# Patient Record
Sex: Female | Born: 1950 | Race: White | Hispanic: No | Marital: Married | Smoking: Former smoker
Health system: Southern US, Community
[De-identification: ages and names within clinical notes are randomized; demographics above are authoritative.]

## PROBLEM LIST (undated history)

## (undated) DIAGNOSIS — Z86010 Personal history of colonic polyps: Secondary | ICD-10-CM

## (undated) DIAGNOSIS — T7840XA Allergy, unspecified, initial encounter: Secondary | ICD-10-CM

## (undated) DIAGNOSIS — I2510 Atherosclerotic heart disease of native coronary artery without angina pectoris: Secondary | ICD-10-CM

## (undated) DIAGNOSIS — A0472 Enterocolitis due to Clostridium difficile, not specified as recurrent: Secondary | ICD-10-CM

## (undated) DIAGNOSIS — I10 Essential (primary) hypertension: Secondary | ICD-10-CM

## (undated) DIAGNOSIS — K623 Rectal prolapse: Secondary | ICD-10-CM

## (undated) DIAGNOSIS — E559 Vitamin D deficiency, unspecified: Secondary | ICD-10-CM

## (undated) DIAGNOSIS — C801 Malignant (primary) neoplasm, unspecified: Secondary | ICD-10-CM

## (undated) DIAGNOSIS — I499 Cardiac arrhythmia, unspecified: Secondary | ICD-10-CM

## (undated) DIAGNOSIS — R7303 Prediabetes: Secondary | ICD-10-CM

## (undated) DIAGNOSIS — I4891 Unspecified atrial fibrillation: Secondary | ICD-10-CM

## (undated) DIAGNOSIS — J189 Pneumonia, unspecified organism: Secondary | ICD-10-CM

## (undated) DIAGNOSIS — F32A Depression, unspecified: Secondary | ICD-10-CM

## (undated) DIAGNOSIS — M1990 Unspecified osteoarthritis, unspecified site: Secondary | ICD-10-CM

## (undated) DIAGNOSIS — K759 Inflammatory liver disease, unspecified: Secondary | ICD-10-CM

## (undated) DIAGNOSIS — E785 Hyperlipidemia, unspecified: Secondary | ICD-10-CM

## (undated) DIAGNOSIS — H269 Unspecified cataract: Secondary | ICD-10-CM

## (undated) DIAGNOSIS — Z87442 Personal history of urinary calculi: Secondary | ICD-10-CM

## (undated) DIAGNOSIS — D0590 Unspecified type of carcinoma in situ of unspecified breast: Secondary | ICD-10-CM

## (undated) DIAGNOSIS — F419 Anxiety disorder, unspecified: Secondary | ICD-10-CM

## (undated) HISTORY — DX: Allergy, unspecified, initial encounter: T78.40XA

## (undated) HISTORY — PX: CHOLECYSTECTOMY: SHX55

## (undated) HISTORY — PX: CARDIAC DEFIBRILLATOR PLACEMENT: SHX171

## (undated) HISTORY — PX: NASAL SEPTUM SURGERY: SHX37

## (undated) HISTORY — DX: Personal history of colonic polyps: Z86.010

## (undated) HISTORY — DX: Unspecified cataract: H26.9

## (undated) HISTORY — PX: CARDIOVERSION: SHX1299

## (undated) HISTORY — DX: Hyperlipidemia, unspecified: E78.5

## (undated) HISTORY — DX: Enterocolitis due to Clostridium difficile, not specified as recurrent: A04.72

## (undated) HISTORY — DX: Unspecified atrial fibrillation: I48.91

## (undated) HISTORY — PX: TONSILLECTOMY: SUR1361

## (undated) HISTORY — DX: Rectal prolapse: K62.3

## (undated) HISTORY — PX: COLONOSCOPY: SHX174

## (undated) HISTORY — DX: Unspecified osteoarthritis, unspecified site: M19.90

## (undated) HISTORY — DX: Anxiety disorder, unspecified: F41.9

## (undated) HISTORY — PX: APPENDECTOMY: SHX54

## (undated) HISTORY — DX: Vitamin D deficiency, unspecified: E55.9

## (undated) HISTORY — DX: Essential (primary) hypertension: I10

## (undated) HISTORY — DX: Prediabetes: R73.03

---

## 1980-01-14 HISTORY — PX: LAMINECTOMY: SHX219

## 1981-01-13 HISTORY — PX: BREAST SURGERY: SHX581

## 1983-01-14 HISTORY — PX: VAGINAL HYSTERECTOMY: SUR661

## 1987-01-14 HISTORY — PX: DIAGNOSTIC LAPAROSCOPY: SUR761

## 2001-03-18 ENCOUNTER — Other Ambulatory Visit: Admission: RE | Admit: 2001-03-18 | Discharge: 2001-03-18 | Payer: Self-pay | Admitting: Internal Medicine

## 2001-03-18 ENCOUNTER — Encounter: Payer: Self-pay | Admitting: Internal Medicine

## 2001-03-18 ENCOUNTER — Ambulatory Visit (HOSPITAL_COMMUNITY): Admission: RE | Admit: 2001-03-18 | Discharge: 2001-03-18 | Payer: Self-pay | Admitting: Internal Medicine

## 2001-07-15 ENCOUNTER — Encounter: Admission: RE | Admit: 2001-07-15 | Discharge: 2001-07-15 | Payer: Self-pay | Admitting: Internal Medicine

## 2001-07-15 ENCOUNTER — Encounter: Payer: Self-pay | Admitting: Internal Medicine

## 2002-08-24 ENCOUNTER — Encounter: Admission: RE | Admit: 2002-08-24 | Discharge: 2002-08-24 | Payer: Self-pay | Admitting: Internal Medicine

## 2002-08-24 ENCOUNTER — Encounter: Payer: Self-pay | Admitting: Internal Medicine

## 2002-11-14 ENCOUNTER — Inpatient Hospital Stay (HOSPITAL_COMMUNITY): Admission: EM | Admit: 2002-11-14 | Discharge: 2002-11-17 | Payer: Self-pay | Admitting: Emergency Medicine

## 2002-11-16 ENCOUNTER — Encounter (INDEPENDENT_AMBULATORY_CARE_PROVIDER_SITE_OTHER): Payer: Self-pay | Admitting: Cardiology

## 2002-11-16 ENCOUNTER — Encounter: Payer: Self-pay | Admitting: Cardiology

## 2003-01-14 LAB — HM COLONOSCOPY

## 2004-10-22 ENCOUNTER — Encounter: Admission: RE | Admit: 2004-10-22 | Discharge: 2004-10-22 | Payer: Self-pay | Admitting: Internal Medicine

## 2005-12-30 ENCOUNTER — Observation Stay (HOSPITAL_COMMUNITY): Admission: EM | Admit: 2005-12-30 | Discharge: 2005-12-31 | Payer: Self-pay | Admitting: Emergency Medicine

## 2005-12-30 ENCOUNTER — Encounter (INDEPENDENT_AMBULATORY_CARE_PROVIDER_SITE_OTHER): Payer: Self-pay | Admitting: Specialist

## 2006-03-19 ENCOUNTER — Ambulatory Visit (HOSPITAL_COMMUNITY): Admission: RE | Admit: 2006-03-19 | Discharge: 2006-03-19 | Payer: Self-pay | Admitting: Gynecology

## 2006-06-23 ENCOUNTER — Ambulatory Visit (HOSPITAL_COMMUNITY): Admission: RE | Admit: 2006-06-23 | Discharge: 2006-06-23 | Payer: Self-pay | Admitting: Gynecology

## 2006-07-29 ENCOUNTER — Ambulatory Visit (HOSPITAL_COMMUNITY): Admission: RE | Admit: 2006-07-29 | Discharge: 2006-07-29 | Payer: Self-pay | Admitting: Internal Medicine

## 2007-06-16 ENCOUNTER — Ambulatory Visit (HOSPITAL_COMMUNITY): Admission: RE | Admit: 2007-06-16 | Discharge: 2007-06-16 | Payer: Self-pay | Admitting: Internal Medicine

## 2007-11-01 ENCOUNTER — Encounter: Admission: RE | Admit: 2007-11-01 | Discharge: 2007-11-01 | Payer: Self-pay | Admitting: Internal Medicine

## 2009-05-23 ENCOUNTER — Ambulatory Visit (HOSPITAL_COMMUNITY): Admission: RE | Admit: 2009-05-23 | Discharge: 2009-05-23 | Payer: Self-pay | Admitting: Internal Medicine

## 2009-05-23 LAB — HM DEXA SCAN: HM Dexa Scan: NORMAL

## 2010-02-02 ENCOUNTER — Encounter: Payer: Self-pay | Admitting: Internal Medicine

## 2010-05-31 NOTE — Op Note (Signed)
   NAME:  Taylor Li, Taylor Li                         ACCOUNT NO.:  [id number]   MEDICAL RECORD NO.:  [id number]                   PATIENT TYPE:  INP   LOCATION:  4703                                 FACILITY:  [facility]   PHYSICIAN:  Cristy Hilts. Jacinto Halim, M.D.                  DATE OF BIRTH:  06-02-1950   DATE OF PROCEDURE:  11/16/2002  DATE OF DISCHARGE:                                 OPERATIVE REPORT   PROCEDURE:  Direct current cardioversion using 50 joules of biphasic  defibrillator synchronized electrical cardioversion using biphasic  defibrillator.   INDICATIONS FOR PROCEDURE:  The patient is a 60 year old female with history  of hypertension, who was admitted to the hospital with new onset of atrial  fibrillation.  She previously has had an episode in the past of atrial  fibrillation.  Because of new onset of atrial fibrillation, it was decided  to proceed with electrical cardioversion.   DESCRIPTION OF PROCEDURE:  Using the help of anesthesia with deep sedation,  synchronized direct current cardioversion was performed using biphasic  defibrillator.  50 joules of electrical current was delivered with  conversion of atrial fibrillation to normal sinus rhythm.  The patient  tolerated the procedure well and remained hemodynamically stable post  procedure.                                               Cristy Hilts. Jacinto Halim, M.D.    Pilar Plate  D:  11/16/2002  T:  11/17/2002  Job:  [id number]   cc:   Thereasa Solo. Little, M.D.  [address]  [place] [address]  Fax: [phone]

## 2010-05-31 NOTE — H&P (Signed)
NAME:  Taylor Li, Taylor Li               ACCOUNT NO.:  [id number]   MEDICAL RECORD NO.:  [id number]          PATIENT TYPE:  INP   LOCATION:  1828                         FACILITY:  [facility]   PHYSICIAN:  Sharlet Salina T. Hoxworth, M.D.DATE OF BIRTH:  1950-03-18   DATE OF ADMISSION:  12/30/2005  DATE OF DISCHARGE:                              HISTORY & PHYSICAL   CHIEF COMPLAINT:  Abdominal pain.   HISTORY OF PRESENT ILLNESS:  Taylor Li is a 60 year old white female  who 3 days ago developed the onset of severe pressure-like epigastric  and right upper quadrant pain, radiating to her back, associated with  nausea.  The pain has waxed and waned over the last 3 days but has never  gone away and today has been very severe.  She has consulted with Dr.  Oneta Rack and yesterday had a gallbladder ultrasound showing gallstones as  described below.  The pain worsened today, and she presents to the [facility] emergency room.  She describes a pressure-like pain, nausea without  vomiting.  No fever, chills or jaundice.  For several months she has had  milder episodes of similar pain often brought on after fatty foods.  Bowel movements have been okay.  No other GI or abdominal history of  significance.   PAST MEDICAL HISTORY:   SURGERY:  1. Appendectomy.  2. Hysterectomy.,  3. Lumbar laminectomy.,  4. Tonsillectomy.,  5. Deviated septum repair.  6. Pelvic laparoscopy.   MEDICAL:  1. Followed for a history of atrial fibrillation status post      cardioversion several years ago.  2. Hypertension.  3. Mild depression.  4. Osteoarthritis.   MEDICATIONS:  1. Aspirin 81 mg daily.  2. Quinoretic one daily.  3. Toprol XL 50 mg daily.  4. Wellbutrin 300 mg daily.  5. Naprosyn p.r.n.  6. Flexeril p.r.n.   ALLERGIES:  PHENERGAN which causes itching.   SOCIAL HISTORY:  She is married.  Works for [employer].  Quit  cigarettes in 2005.  No alcohol.   FAMILY HISTORY:  Noncontributory.   REVIEW  OF SYSTEMS:  GENERAL:  No fever, chills, weight change.  RESPIRATORY:  No shortness breath, cough, wheezing.  CARDIAC: No recent  palpitations, chest pain.  GI:  As above.  GU: No urinary burning or  frequency.  MUSCULOSKELETAL: Some chronic joint and back pain.   PHYSICAL EXAMINATION:  VITAL SIGNS: She is afebrile.  Vital Signs all  within normal limits.  GENERAL:  Alert, well-developed female who appears uncomfortable.  SKIN:  Warm, dry.  No rash or infection.  HEENT/NECK: No palpable mass or thyromegaly.  Sclerae nonicteric.  Nares, oropharynx clear.  LUNGS:  Clear without wheezing or increased work of breathing.  LYMPH NODES.  No cervical, subclavicular or inguinal nodes palpable.  ABDOMEN:  Nondistended.  Bowel sounds present.  There is well-localized  right upper quadrant tenderness with guarding.  No palpable masses or  organomegaly.  EXTREMITIES: No joint swelling or deformity.  NEUROLOGIC:  Alert, oriented.  Motor and sensory exams grossly normal.   LABORATORY:  CBC and CMET are within normal limits.  IMAGING:  Gallbladder ultrasound at [facility] December 17  shows multiple gallstones, normal common bile duct diameter.  No  gallbladder wall thickening or pericholecystic fluid.   ASSESSMENT/PLAN:  Persistent abdominal pain consistent with ongoing  biliary colic versus early acute cholecystitis.  The patient will be  admitted and treated  with pain medication, IV fluids.  Will plan urgent  laparoscopic cholecystectomy with cholangiogram.      Sharlet Salina T. Hoxworth, M.D.  Electronically Signed     BTH/MEDQ  D:  12/30/2005  T:  12/31/2005  Job:  [id number]

## 2010-05-31 NOTE — Discharge Summary (Signed)
NAME:  Taylor Li, Taylor Li                         ACCOUNT NO.:  [id number]   MEDICAL RECORD NO.:  [id number]                   Li TYPE:  INP   LOCATION:  4703                                 FACILITY:  [facility]   PHYSICIAN:  Thereasa Solo. Little, M.D.              DATE OF BIRTH:  03/06/1950   DATE OF ADMISSION:  11/15/2002  DATE OF DISCHARGE:  11/17/2002                                 DISCHARGE SUMMARY   DISCHARGE DIAGNOSES:  1. Paroxysmal atrial fibrillation resolved.     a. Transesophageal echocardiography with direct current cardioversion to        sinus rhythm.  2. Chest discomfort resolved.  3. Hypotension secondary to medications, improved after fluid bolus.  4. Tobacco use, resolving.  5. Hypertension controlled.  6. Anticoagulation.   DISCHARGE CONDITION:  Improved.   PROCEDURE:  TEE with DC cardioversion by Dr. Yates Decamp on November 16, 2002.   DISCHARGE MEDICATIONS:  1. Toprol XL 25 mg daily.  2. Wellbutrin XL 300 mg daily.   DISCHARGE MEDICATIONS:  1. Toprol XL 25 mg daily.  2. Wellbutrin XL 300 mg daily.  3. Estradiol one mg daily.  4. Coumadin 5 mg one daily between 4 and 6 p.m.  5. Lovenox 85 mg SQ q.12h.  6. Xanax 0.5 mg one q.8-12h p.r.n. for stress anxiety.  7. Stop aspirin and Benicar.   DISCHARGE INSTRUCTIONS:  1. Have blood work done at [facility] on Monday.  2. Do not return to work until Monday, November 21, 2002.  3. Low fat diet.  4. Follow with Dr. Clarene Duke in [facility] December 02, 2002 at 3:30 p.m.   HISTORY OF PRESENT ILLNESS:  60 year old, recently remarried white female  was admitted to [facility] on November 14, 2002 after presenting with onset  of atrial fibrillation with rapid ventricular response.  Taylor Li has had one  episode of this previously but this was Taylor first recurrence in some time.  Taylor Li was seen by Dr. Kem Boroughs for cardiology and Taylor Li had her first  episode of atrial fibrillation in 1989, converted at home with Digoxin.  Taylor Li  had complained of PACs, PVCs with no atrial fibrillation since that time.   Last year in 2003 Taylor Li had chest pain.  Taylor Li had a nuclear stress-  test, no ischemia, ejection fraction was 67%.  Lately Taylor Li had been very  dizzy and had gone into atrial fibrillation with activity.  Taylor Li had  some mild shortness of breath, occasional chest twinge with atrial fib.   PAST MEDICAL HISTORY:  1. Hypertension.  2. Tobacco use on Wellbutrin to help lower that.  3. History of elevated triglycerides.  4. Partial hysterectomy.  5. Laminectomy.  6. 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   For family history, social history, review of systems, see H&P.   PHYSICAL EXAMINATION AT DISCHARGE:  VITAL SIGNS:  Blood  pressure had been  86/60, currently 110/70, pulse 62, respirations 24, temperature 96.6.  Oxygen saturation on room air 100%.  HEART:  Regular rate and rhythm.  LUNGS:  Clear.  ABDOMEN:  Soft, nontender, positive bowel sounds.  EXTREMITIES:  Without edema.   LABORATORY DATA:  Total cholesterol 164, triglycerides 81, HDL 70, LDL 78.  Protime on day of discharge 12.9, INR of 1.0 (this will be day three of  Coumadin).  Hemoglobin 13.2, hematocrit 39.9, white blood cell count 8000,  platelets 252,000, neutorphils 45, lymphs 45, monos 8, eos 2.  Chemistries:  Sodium 137, potassium 3.8, chloride 105, CO2 27, glucose 103, BUN 12,  creatinine 0.8 and calcium 8.6.  Cardiac markers were all negative for  myocardial infarction with CKs ranging from 147 to 103, MBs 3.1 to 2.1 and  troponin I of 0.01 to less than 0.01.  Chest x-ray was done November 14, 2002, no evidence of acute cardiopulmonary process.  PA and lateral was also  done at [facility], results of that are not yet back.   EKG on admission: Atrial fibrillation with initially increased heart rate  then rate controlled and prior to discharge, sinus rhythm after  cardioversion.    HOSPITAL COURSE:  Taylor Li was admitted to [facility] on November 14, 2002  and then transferred to [facility] on November 15, 2002 for TEE  cardioversion.  Taylor Li had presented with atrial fibrillation, rapid  response and was placed on subcutaneous Lovenox.  Coumadin was to be started  on Taylor second.  Taylor Li was seen in consultation by Dr. Domingo Sep and then  transferred to [facility] for further treatment.   On November 16, 2002 Dr. Jacinto Halim did TEE with cardioversion and cardioverted  Li into sinus rhythm.  Taylor Li has done well since.  Her heparin was  discontinued that Taylor Li had been on at [facility] and Taylor Li was placed on  Lovenox and Coumadin for cross-over.   In Taylor early morning hours of November 17, 2002 Taylor Li developed hypotension,  received a fluid bolus; probably secondary to beta blocker.  Taylor Li was on  short acting Lopressor and was switched to Toprol XL 25 mg and would be  discharged home in Taylor early afternoon of November 17, 2002 if blood pressure  stable on Taylor Toprol.  Taylor Li was seen by Dr. Clarene Duke on Taylor day of discharge  and was discharged home on medications as stated previously.  Taylor Li will get  her next protime on Monday and continue Lovenox until that time.      Darcella Gasman. Ingold, N.P.                     Thereasa Solo. Little, M.D.    LRI/MEDQ  D:  11/17/2002  T:  11/18/2002  Job:  [id number]   cc:   [name], M.D.  [address]  [place], [place] [address]  Fax: [phone]   Vania Rea, M.D.   Kem Boroughs, M.D.  [facility]

## 2010-05-31 NOTE — Op Note (Signed)
NAME:  Taylor Li, Taylor Li               ACCOUNT NO.:  [id number]   MEDICAL RECORD NO.:  [id number]          PATIENT TYPE:  INP   LOCATION:  1828                         FACILITY:  [facility]   PHYSICIAN:  Sharlet Salina T. Hoxworth, M.D.DATE OF BIRTH:  22-Jun-1950   DATE OF PROCEDURE:  12/30/2005  DATE OF DISCHARGE:                               OPERATIVE REPORT   PREOPERATIVE DIAGNOSIS:  Cholelithiasis and cholecystitis.   POSTOPERATIVE DIAGNOSIS:  Cholelithiasis and cholecystitis.   SURGICAL PROCEDURE:  Laparoscopic cholecystectomy with intraoperative  cholangiogram.   SURGEON:  Sharlet Salina T. Hoxworth, M.D.   ANESTHESIA:  General.   BRIEF HISTORY:  Taylor Li is a 60 year old female with three days of  constant waxing and waning epigastric and right upper quadrant abdominal  pain, pressure like.  Today, it became quite severe, and she presented  to the [facility] Emergency Room.  Exam is significant for right upper  quadrant tenderness.  Gallbladder ultrasound done yesterday at  [facility] showed multiple gallstones.  I have recommended  proceeding with urgent laparoscopic cholecystectomy with cholangiogram.  The nature of the procedure, indications, risks of bleeding, infection,  bile leak, and bile duct injury were discussed and understood.  She is  now brought to the operating room for this procedure.   DESCRIPTION OF OPERATION:  The patient was brought to the operating room  and placed in the supine position on the operating table, and general  endotracheal anesthesia was induced.  The abdomen was widely sterilely  prepped and draped.  Local anesthesia was used to infiltrate the trocar  sites.  A 1 cm incision was made at the umbilicus.  Dissection was  carried down to the midline fascia, which was sharply incised for 1 cm,  and the peritoneum was entered under direct vision.  Through a mattress  suture of 0 Vicryl, the Hasson trocar was placed and pneumoperitoneum  established.   Under direct vision, a 10 mm trocar was placed in the  subxiphoid area, and two 5 mm trocars along the right subcostal margin.  The gallbladder was visualized and was quite enlarged and distended,  with a lot of chronic inflammatory adhesions.  The fundus was grasped  and elevated up over the liver, and omental adhesions were carefully  taken down with blunt and cautery dissection.  There were some filmy  adhesions of the omentum up to the infundibulum that were carefully  taken down, protecting the duodenum.  The infundibulum was retracted  inferolaterally, and further fibrofatty tissue was stripped off the neck  of the gallbladder toward the porta hepatis and peritoneum anterior and  posterior to Calot's triangle was incised.  The distal gallbladder was  thoroughly dissected and the cystic duct identified.  The cystic artery  was clearly identified coursing up on the gallbladder wall.  The cystic  duct was dissected circumferentially at the gallbladder junction and  dissected out over about a centimeter.  When the anatomy was clear, the  cystic artery was singly clipped, and the cystic duct was clipped at the  gallbladder junction.  An operative cholangiogram was then obtained  through the cystic  duct, which showed good filling of the normal common  bile duct and intrahepatic ducts, with free flow into the duodenum and  no filling defects.  Following this, the cholangiocatheter was removed,  and the cystic duct was triply clipped proximally and divided.  The  cystic artery was further clipped proximally and distally and divided.  The gallbladder was then dissected free from its bed using hook cautery  and removed through the umbilicus.  Complete hemostasis was obtained in  the gallbladder bed, and the area was thoroughly irrigated and suctioned  until clear.  Trocars were then removed under  direct vision and all CO2 evacuated.  The mattress suture was secured at  the umbilicus.  Skin  incisions were closed with interrupted subcuticular  4-0 Monocryl and Dermabond.   Sponge, needle, and instrument counts were correct.  The patient was  taken to the recovery room in good condition.      Lorne Skeens. Hoxworth, M.D.  Electronically Signed     BTH/MEDQ  D:  12/30/2005  T:  12/31/2005  Job:  [id number]   cc:   [name], M.D.

## 2010-05-31 NOTE — H&P (Signed)
NAME:  Taylor Li, Taylor Li NO.:  [id number]   MEDICAL RECORD NO.:  [id number]                   PATIENT TYPE:  INP   LOCATION:  A226                                 FACILITY:  [facility]   PHYSICIAN:  Vania Rea, M.D.              DATE OF BIRTH:  Jun 28, 1950   DATE OF ADMISSION:  11/14/2002  DATE OF DISCHARGE:                                HISTORY & PHYSICAL   PRIMARY CARE PHYSICIAN:  [name], M.D.   CHIEF COMPLAINT:  Tired and irregular pulse since this morning.   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  However, since that episode the patient does not recall a clear-cut episode  of irregular heartbeat until this morning when she developed extreme fatigue  with an irregular self-assessed pulse and suffocating feeling in her chest.  At the time she also noted severe fatigue with exertion and lightheadedness.  She denies fever, syncope, shortness of breath, PND, or orthopnea.  She  became alarmed and came to the emergency room.   PAST MEDICAL HISTORY:  Significant for:  1. Atrial fibrillation in 1989.  2. Mitral valve prolapse requiring antibiotic prophylaxis for procedures.  3. Hypertension.  4. Tobacco abuse.   MEDICATIONS:  1. Benicar 20 mg daily for the past two years.  2. Wellbutrin 300 mg daily.  3. Estriol daily.  4. Vitamin E.  5. Vitamin C.  6. Bee pollen complex.  7. Caltrate with vitamin D.  8. Glucosamine.   ALLERGIES:  FRESH GREEN PEPPERS cause a rash and sometimes angioedema.   SOCIAL HISTORY:  She smokes about 3/4-pack per day for the past 20 or 30  years on and off.  Occasional alcohol.  No drug use.   FAMILY HISTORY:  Noncontributory.   REVIEW OF  SYSTEMS:  Similarly noncontributory.  She denies the use of any  other over-the-counter or natural products, particularly weight loss  products.  She says she does not use decongestants because they tend to give  her racing of the heart.   PHYSICAL EXAM:  VITAL SIGNS:  Temperature 99.2, pulse 96, blood pressure  125/92, respirations 20.  HEENT:  PERRL.  Pink and anicteric.  CHEST:  CTAB.  CVS:  Irregularly irregular rhythm.  ABDOMEN:  Soft.  Mildly obese.  Nontender.  EXTREMITIES:  No edema.  With 3+ pulses.  CNS:  Alert and oriented x3.  No focal deficit.   LABORATORIES:  Hemoglobin 13.7, hematocrit 41.1, white count 81.5, normal  differential, platelets 269.  Sodium 145, potassium 3.9, chloride 109, CO2  27, BUN 13, creatinine 0.8, glucose 107, calcium 10.1.  CK total is 1.7,  CK-  MB 3.1, troponin less than 0.01.   EKG shows atrial fibrillation with a rate of 111 and Q-waves in III and aVF.  A chest x-ray was unremarkable.   ASSESSMENT:  1. Atrial fibrillation with a varying response.  The rate has been observed     to be from 90-120.  2. Acute chest pressure, rule out myocardial infarction.   PLAN:  Admit to telemetry.  Anticoagulate.  Rate control with Lopressor.  Continue cardiac enzyme workup to rule out MI.  Get a cardiology consult  with [facility] as she is familiar with Dr. Clarene Duke from her  previous episode of atrial fibrillation.  Will make sure to control her  blood pressure also with Lopressor.     ___________________________________________                                         Vania Rea, M.D.   LC/MEDQ  D:  11/14/2002  T:  11/15/2002  Job:  [id number]

## 2011-12-03 ENCOUNTER — Other Ambulatory Visit: Payer: Self-pay | Admitting: Internal Medicine

## 2011-12-03 DIAGNOSIS — I719 Aortic aneurysm of unspecified site, without rupture: Secondary | ICD-10-CM

## 2011-12-03 DIAGNOSIS — R1900 Intra-abdominal and pelvic swelling, mass and lump, unspecified site: Secondary | ICD-10-CM

## 2011-12-04 ENCOUNTER — Ambulatory Visit
Admission: RE | Admit: 2011-12-04 | Discharge: 2011-12-04 | Disposition: A | Discharge status: Home / Self Care | Payer: Commercial Managed Care - PPO | Source: Ambulatory Visit | Attending: Internal Medicine | Admitting: Internal Medicine

## 2011-12-04 DIAGNOSIS — I719 Aortic aneurysm of unspecified site, without rupture: Secondary | ICD-10-CM

## 2011-12-04 DIAGNOSIS — R1900 Intra-abdominal and pelvic swelling, mass and lump, unspecified site: Secondary | ICD-10-CM

## 2012-12-08 ENCOUNTER — Encounter: Payer: Self-pay | Admitting: Internal Medicine

## 2012-12-12 ENCOUNTER — Other Ambulatory Visit: Payer: Self-pay | Admitting: Internal Medicine

## 2013-01-11 ENCOUNTER — Encounter: Payer: Self-pay | Admitting: Internal Medicine

## 2013-01-11 ENCOUNTER — Other Ambulatory Visit: Payer: Self-pay | Admitting: Internal Medicine

## 2013-01-14 ENCOUNTER — Ambulatory Visit (INDEPENDENT_AMBULATORY_CARE_PROVIDER_SITE_OTHER): Payer: Commercial Managed Care - PPO | Admitting: Internal Medicine

## 2013-01-14 ENCOUNTER — Encounter: Payer: Self-pay | Admitting: Internal Medicine

## 2013-01-14 VITALS — BP 124/84 | HR 64 | Temp 97.9°F | Resp 16 | Ht 69.0 in | Wt 194.9 lb

## 2013-01-14 DIAGNOSIS — E559 Vitamin D deficiency, unspecified: Secondary | ICD-10-CM | POA: Insufficient documentation

## 2013-01-14 DIAGNOSIS — R740 Nonspecific elevation of levels of transaminase and lactic acid dehydrogenase [LDH]: Secondary | ICD-10-CM

## 2013-01-14 DIAGNOSIS — I480 Paroxysmal atrial fibrillation: Secondary | ICD-10-CM | POA: Insufficient documentation

## 2013-01-14 DIAGNOSIS — Z111 Encounter for screening for respiratory tuberculosis: Secondary | ICD-10-CM

## 2013-01-14 DIAGNOSIS — I10 Essential (primary) hypertension: Secondary | ICD-10-CM | POA: Insufficient documentation

## 2013-01-14 DIAGNOSIS — Z79899 Other long term (current) drug therapy: Secondary | ICD-10-CM

## 2013-01-14 DIAGNOSIS — Z1212 Encounter for screening for malignant neoplasm of rectum: Secondary | ICD-10-CM

## 2013-01-14 DIAGNOSIS — Z0000 Encounter for general adult medical examination without abnormal findings: Secondary | ICD-10-CM

## 2013-01-14 DIAGNOSIS — Z113 Encounter for screening for infections with a predominantly sexual mode of transmission: Secondary | ICD-10-CM

## 2013-01-14 DIAGNOSIS — R7309 Other abnormal glucose: Secondary | ICD-10-CM | POA: Insufficient documentation

## 2013-01-14 DIAGNOSIS — I4891 Unspecified atrial fibrillation: Secondary | ICD-10-CM

## 2013-01-14 DIAGNOSIS — E782 Mixed hyperlipidemia: Secondary | ICD-10-CM | POA: Insufficient documentation

## 2013-01-14 DIAGNOSIS — R7401 Elevation of levels of liver transaminase levels: Secondary | ICD-10-CM

## 2013-01-14 LAB — CBC WITH DIFFERENTIAL/PLATELET
BASOS ABS: 0 10*3/uL (ref 0.0–0.1)
Basophils Relative: 0 % (ref 0–1)
Eosinophils Absolute: 0.1 10*3/uL (ref 0.0–0.7)
Eosinophils Relative: 2 % (ref 0–5)
HCT: 40.9 % (ref 36.0–46.0)
Hemoglobin: 14 g/dL (ref 12.0–15.0)
LYMPHS ABS: 2.1 10*3/uL (ref 0.7–4.0)
Lymphocytes Relative: 31 % (ref 12–46)
MCH: 29.4 pg (ref 26.0–34.0)
MCHC: 34.2 g/dL (ref 30.0–36.0)
MCV: 85.7 fL (ref 78.0–100.0)
Monocytes Absolute: 0.5 10*3/uL (ref 0.1–1.0)
Monocytes Relative: 8 % (ref 3–12)
NEUTROS ABS: 3.9 10*3/uL (ref 1.7–7.7)
NEUTROS PCT: 59 % (ref 43–77)
PLATELETS: 281 10*3/uL (ref 150–400)
RBC: 4.77 MIL/uL (ref 3.87–5.11)
RDW: 13.3 % (ref 11.5–15.5)
WBC: 6.6 10*3/uL (ref 4.0–10.5)

## 2013-01-14 LAB — HEPATIC FUNCTION PANEL
ALT: 22 U/L (ref 0–35)
AST: 18 U/L (ref 0–37)
Albumin: 4.2 g/dL (ref 3.5–5.2)
Alkaline Phosphatase: 64 U/L (ref 39–117)
BILIRUBIN DIRECT: 0.1 mg/dL (ref 0.0–0.3)
BILIRUBIN INDIRECT: 0.5 mg/dL (ref 0.0–0.9)
BILIRUBIN TOTAL: 0.6 mg/dL (ref 0.3–1.2)
TOTAL PROTEIN: 6.7 g/dL (ref 6.0–8.3)

## 2013-01-14 LAB — LIPID PANEL
CHOLESTEROL: 185 mg/dL (ref 0–200)
HDL: 80 mg/dL (ref >39)
LDL Cholesterol: 89 mg/dL (ref 0–99)
Total CHOL/HDL Ratio: 2.3 Ratio
Triglycerides: 80 mg/dL (ref <150)
VLDL: 16 mg/dL (ref 0–40)

## 2013-01-14 LAB — VITAMIN B12: Vitamin B-12: 239 pg/mL (ref 211–911)

## 2013-01-14 LAB — HEMOGLOBIN A1C
HEMOGLOBIN A1C: 6 % — AB (ref <5.7)
MEAN PLASMA GLUCOSE: 126 mg/dL — AB (ref <117)

## 2013-01-14 LAB — BASIC METABOLIC PANEL WITH GFR
BUN: 14 mg/dL (ref 6–23)
CHLORIDE: 104 meq/L (ref 96–112)
CO2: 26 mEq/L (ref 19–32)
CREATININE: 0.74 mg/dL (ref 0.50–1.10)
Calcium: 9.2 mg/dL (ref 8.4–10.5)
GFR, EST NON AFRICAN AMERICAN: 88 mL/min
GFR, Est African American: >89 mL/min
Glucose, Bld: 99 mg/dL (ref 70–99)
POTASSIUM: 4.2 meq/L (ref 3.5–5.3)
SODIUM: 139 meq/L (ref 135–145)

## 2013-01-14 LAB — MAGNESIUM: Magnesium: 1.8 mg/dL (ref 1.5–2.5)

## 2013-01-14 LAB — HEPATITIS A ANTIBODY, TOTAL: Hep A Total Ab: REACTIVE — AB

## 2013-01-14 LAB — RPR

## 2013-01-14 LAB — HEPATITIS B CORE ANTIBODY, TOTAL: Hep B Core Total Ab: NONREACTIVE

## 2013-01-14 LAB — HIV ANTIBODY (ROUTINE TESTING W REFLEX): HIV: NONREACTIVE

## 2013-01-14 LAB — HEPATITIS B SURFACE ANTIBODY,QUALITATIVE: Hep B S Ab: POSITIVE — AB

## 2013-01-14 LAB — HEPATITIS C ANTIBODY: HCV Ab: NEGATIVE

## 2013-01-14 LAB — TSH: TSH: 0.739 u[IU]/mL (ref 0.350–4.500)

## 2013-01-14 NOTE — Progress Notes (Signed)
Patient ID: Taylor Li, female   DOB: [birth year], 63 y.o.   MRN: [id number]  Annual Screening Comprehensive Examination  This very nice 63 y.o. reMWF presents for complete physical.  Patient has been followed for HTN,  Hx/o PAfib(remote),  Prediabetes, Hyperlipidemia, and Vitamin D Deficiency.   HTN predates since 2004. Patient's BP has been controlled at home. Today's BP is blood pressure 124/84. She did have successful TEE cardioversion of pAfib in 2003. In Apr 2011, she had a negative cardiolyte.Patient denies any cardiac symptoms as chest pain, palpitations, shortness of breath, dizziness or ankle swelling.   Patient's hyperlipidemia is controlled with diet and supplements. Patient denies myalgias or other medication SE's. Last cholesterol last visit was 189, triglycerides 68, HDL 78 and LDL 97 in Sept - all at goal.     Patient has prediabetes predating from July 2012 with last A1c was 5.9% in Sept. Patient denies reactive hypoglycemic symptoms, visual blurring, diabetic polys, or paresthesias.    Finally, patient has history of Vitamin D Deficiency with Vit. D 36 in 2010 and last vitamin D was44 with recommendation to increase her dose.     Medication Sig Dispense Refill  . ALPRAZolam (XANAX) 1 MG tablet Take 1 mg by mouth 3 (three) times daily as needed for anxiety.      Marland Kitchen aspirin 81 MG tablet Take 81 mg by mouth daily.      Marland Kitchen buPROPion (WELLBUTRIN XL) 300 MG 24 hr tablet TAKE 1 TABLET BY MOUTH EVERY DAY  30 tablet  2  . Cholecalciferol (VITAMIN D-3) 5000 UNITS TABS Take 5,000 Units by mouth 2 (two) times daily.       . citalopram (CELEXA) 40 MG tablet Take 20 mg by mouth daily.      Marland Kitchen estradiol (ESTRACE) 1 MG tablet Take 0.5 mg by mouth 2 (two) times a week.      . Flaxseed, Linseed, (FLAX SEED OIL PO) Take by mouth.      . hydrochlorothiazide (MICROZIDE) 12.5 MG capsule Take 12.5 mg by mouth daily.      . metoprolol succinate (TOPROL-XL) 50 MG 24 hr tablet TAKE 1 TABLET BY MOUTH  EVERY DAY *MAX 30 DAYS ON INSURANCE*  90 tablet  1  . Omega-3 Fatty Acids (FISH OIL PO) Take by mouth.       No current facility-administered medications on file prior to visit.    Allergies  Allergen Reactions  . Phenergan [Promethazine Hcl] Itching    Past Medical History  Diagnosis Date  . Hyperlipidemia   . Hypertension   . Pre-diabetes   . Vitamin D deficiency     Past Surgical History  Procedure Laterality Date  . Laminectomy  1982    L4-L5   1985 Vag. Hys.     1983 Bil. 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       Family History  Problem Relation Age of Onset  . Hypertension Mother   . Barrett's esophagus Mother   . Hypertension Father   . Cancer Father     lung  . Thyroid disease Father     History  Substance Use Topics  . Smoking status: Former Smoker    Quit date: 01/13/2002  . Smokeless tobacco: Not on file  . Alcohol  Use: 1.5 oz/week    3 drink(s) per week     Comment: social    ROS Constitutional: Denies fever, chills, weight loss/gain, headaches, insomnia, fatigue, night sweats, and change in appetite. Eyes: Denies redness, blurred vision, diplopia, discharge, itchy, watery eyes.  ENT: Denies discharge, congestion, post nasal drip, epistaxis, sore throat, earache, hearing loss, dental pain, Tinnitus, Vertigo, Sinus pain, snoring.  Cardio: Denies chest pain, palpitations, irregular heartbeat, syncope, dyspnea, diaphoresis, orthopnea, PND, claudication, edema Respiratory: denies cough, dyspnea, DOE, pleurisy, hoarseness, laryngitis, wheezing.  Gastrointestinal: Denies dysphagia, heartburn, reflux, water brash, pain, cramps, nausea, vomiting, bloating, diarrhea, constipation, hematemesis, melena, hematochezia, jaundice, hemorrhoids Genitourinary: Denies dysuria, frequency, urgency, nocturia, hesitancy, discharge, hematuria, flank  pain Breast:Breast lumps, nipple discharge, bleeding.  Musculoskeletal: Denies arthralgia, myalgia, stiffness, Jt. Swelling, pain, limp, and strain/sprain. Skin: Denies puritis, rash, hives, warts, acne, eczema, changing in skin lesion Neuro: No weakness, tremor, incoordination, spasms, paresthesia, pain Psychiatric: Denies confusion, memory loss, sensory loss Endocrine: Denies change in weight, skin, hair change, nocturia, and paresthesia, diabetic polys, visual blurring, hyper / hypo glycemic episodes.  Heme/Lymph: No excessive bleeding, bruising, enlarged lymph nodes.  Filed Vitals:   01/14/13 1157  BP: 124/84  Pulse: 64  Temp: 97.9 F (36.6 C)  Resp: 16    Estimated body mass index is 28.77 kg/(m^2) as calculated from the following:   Height as of this encounter: 5\' 9"  (1.753 m).   Weight as of this encounter: 194 lb 14.4 oz (88.406 kg).  Physical Exam General Appearance: Well nourished, in no apparent distress. Eyes: PERRLA, EOMs, conjunctiva no swelling or erythema, normal fundi and vessels. Sinuses: No frontal/maxillary tenderness ENT/Mouth: EACs patent / TMs  nl. Nares clear without erythema, swelling, mucoid exudates. Oral hygiene is good. No erythema, swelling, or exudate. Tongue normal, non-obstructing. Tonsils not swollen or erythematous. Hearing normal.  Neck: Supple, thyroid normal. No bruits, nodes or JVD. Respiratory: Respiratory effort normal.  BS equal and clear bilateral without rales, rhonci, wheezing or stridor. Cardio: Heart sounds are normal with regular rate and rhythm and no murmurs, rubs or gallops. Peripheral pulses are normal and equal bilaterally without edema. No aortic or femoral bruits. Chest: symmetric with normal excursions and percussion. Breasts: Symmetric, without lumps, nipple discharge, retractions, or fibrocystic changes.  Abdomen: Flat, soft, with bowl sounds. Nontender, no guarding, rebound, hernias, masses, or organomegaly.  Lymphatics: Non  tender without lymphadenopathy.  Genitourinary:  Musculoskeletal: Full ROM all peripheral extremities, joint stability, 5/5 strength, and normal gait. Skin: Warm and dry without rashes, lesions, cyanosis, clubbing or  ecchymosis.  Neuro: Cranial nerves intact, reflexes equal bilaterally. Normal muscle tone, no cerebellar symptoms. Sensation intact.  Pysch: Awake and oriented X 3, normal affect, Insight and Judgment appropriate.   Assessment and Plan  1. Annual Screening Examination 2. Hypertension  3. Hyperlipidemia 4. Pre Diabetes 5. Vitamin D Deficiency  Continue prudent diet as discussed, weight control, BP monitoring, regular exercise, and medications. Discussed med's effects and SE's. Screening labs and tests as requested with regular follow-up as recommended.

## 2013-01-14 NOTE — Patient Instructions (Signed)

## 2013-01-15 LAB — URINALYSIS, MICROSCOPIC ONLY
Bacteria, UA: NONE SEEN
Casts: NONE SEEN
Crystals: NONE SEEN
Squamous Epithelial / HPF: NONE SEEN

## 2013-01-15 LAB — VITAMIN D 25 HYDROXY (VIT D DEFICIENCY, FRACTURES): Vit D, 25-Hydroxy: 38 ng/mL (ref 30–89)

## 2013-01-15 LAB — MICROALBUMIN / CREATININE URINE RATIO
Creatinine, Urine: 123 mg/dL
Microalb Creat Ratio: 4.1 mg/g (ref 0.0–30.0)
Microalb, Ur: 0.5 mg/dL (ref 0.00–1.89)

## 2013-01-15 LAB — INSULIN, FASTING: Insulin fasting, serum: 7 u[IU]/mL (ref 3–28)

## 2013-01-17 ENCOUNTER — Telehealth: Payer: Self-pay | Admitting: *Deleted

## 2013-01-17 ENCOUNTER — Other Ambulatory Visit: Payer: Self-pay | Admitting: Internal Medicine

## 2013-01-17 LAB — TB SKIN TEST
Induration: 0 mm
TB Skin Test: NEGATIVE

## 2013-01-17 LAB — HEPATITIS B E ANTIBODY: Hepatitis Be Antibody: NEGATIVE

## 2013-01-17 MED ORDER — VITAMIN D (ERGOCALCIFEROL) 1.25 MG (50000 UNIT) PO CAPS: ORAL_CAPSULE | ORAL | Status: DC

## 2013-01-17 NOTE — Telephone Encounter (Signed)
Message copied by Emelda Brothers on Mon Jan 17, 2013  2:18 PM ------      Message from: Unk Pinto      Created: Sat Jan 15, 2013  2:16 PM       B12 239 sl low - range is 211-911 - suggest take a B-Complex supplement -       Hep A is positive - shows Hep a infection sometime in past and recovered --- Hep B  Is positive from previous vaccination and immunity or protection      Hep C is negative - thank goodness      HIV/AIDS and RPR/Syphilis are both neg and ok      CBC kidneys liver thyroid U/A all ok --- Mag is sl low - suggest take 500 mg 2 x da      Chol 185 - hdl 80 & ldl 89 - all terrific - keep up great work       A1c 6.0% bordering on diabetes - important stricter diet Your blood sugar and A1c are elevated.        Being diabetic has a  300% increased risk for heart attack, stroke, cancer, and alzheimer- type vascular dementia. It is very important that you work harder with diet by avoiding all foods that are white except chicken & fish. Avoid white rice (brown & wild rice is OK), white potatoes (sweet potatoes in moderation is OK), White bread or wheat bread or anything made out of white flour like bagels, donuts, rolls, buns, biscuits, cakes, pastries, cookies, pizza crust, and pasta (made from white flour & egg whites) - vegetarian pasta or spinach or wheat pasta is OK. Multigrain breads like Arnold's or Pepperidge [employer], or multigrain sandwich thins or flatbreads.  Diet, exercise and weight loss can reverse and cure diabetes in the early stages.  Diet, exercise and weight loss is very important in the control and prevention of complications of diabetes which affects every system in your body, ie. Brain - dementia/stroke, eyes - glaucoma/blindness, heart - heart attack/heart failure, kidneys      - dialysis, stomach - gastric paralysis, intestines - malabsorption, nerves - severe painful neuritis, circulation - gangrene & loss of a leg(s), and finally cancer and Alzheimers.      Vit still  very low at 38 - if on 10,000 then will need to switch to prescription strength Vit D 1.25 mg and start out taking 5 tab / week        ------

## 2013-01-27 ENCOUNTER — Other Ambulatory Visit: Payer: Self-pay | Admitting: *Deleted

## 2013-01-27 DIAGNOSIS — I10 Essential (primary) hypertension: Secondary | ICD-10-CM

## 2013-01-27 LAB — POC HEMOCCULT BLD/STL (HOME/3-CARD/SCREEN)
Card #2 Fecal Occult Blod, POC: NEGATIVE
Card #3 Fecal Occult Blood, POC: NEGATIVE
FECAL OCCULT BLD: NEGATIVE

## 2013-02-12 ENCOUNTER — Other Ambulatory Visit: Payer: Self-pay | Admitting: Internal Medicine

## 2013-02-14 ENCOUNTER — Other Ambulatory Visit: Payer: Self-pay | Admitting: Internal Medicine

## 2013-02-18 ENCOUNTER — Other Ambulatory Visit: Payer: Self-pay | Admitting: Internal Medicine

## 2013-02-18 NOTE — Telephone Encounter (Signed)
SCHEDULED PT.

## 2013-02-21 ENCOUNTER — Other Ambulatory Visit: Payer: Self-pay | Admitting: Internal Medicine

## 2013-02-23 ENCOUNTER — Other Ambulatory Visit: Payer: Self-pay | Admitting: Internal Medicine

## 2013-02-24 ENCOUNTER — Telehealth: Payer: Self-pay | Admitting: *Deleted

## 2013-02-24 NOTE — Telephone Encounter (Signed)
Patient called regarding a refill denial fo her Phentermine.  Per Kelby Aline, PA, the providers have a new policy for refills of this med and require a 1 month office visit to evaluate patient.

## 2013-03-28 ENCOUNTER — Ambulatory Visit (INDEPENDENT_AMBULATORY_CARE_PROVIDER_SITE_OTHER): Payer: Commercial Managed Care - PPO | Admitting: *Deleted

## 2013-03-28 ENCOUNTER — Encounter (INDEPENDENT_AMBULATORY_CARE_PROVIDER_SITE_OTHER): Payer: Self-pay

## 2013-03-28 VITALS — BP 120/76 | Wt 182.6 lb

## 2013-03-28 DIAGNOSIS — E663 Overweight: Secondary | ICD-10-CM

## 2013-03-28 DIAGNOSIS — R7309 Other abnormal glucose: Secondary | ICD-10-CM

## 2013-03-28 MED ORDER — PHENTERMINE HCL 37.5 MG PO TABS: 37.5000 mg | ORAL_TABLET | Freq: Every day | ORAL | Status: DC

## 2013-03-28 NOTE — Progress Notes (Signed)
Patient ID: Taylor Li, female   DOB: 1950/07/01, 63 y.o.   MRN: [id number] Patient presents for BP and weight check. She took Phentermine 10/24/2012 until the end of 2014.  Her weight is 182.6 lb and was 194.8 lb in 01/2013  New RX for Phentermine 37.5 mg to patient by Jennell Corner.  Patient will need 1 month NV to continue RX.

## 2013-04-14 ENCOUNTER — Telehealth: Payer: Self-pay

## 2013-04-14 NOTE — Telephone Encounter (Signed)
Pt called requesting order be sent to [facility] for Diagnostic mammogram w/ u/s if necessary. Faxed order to [facility] [phone]. Pt aware

## 2013-04-16 ENCOUNTER — Other Ambulatory Visit: Payer: Self-pay | Admitting: Internal Medicine

## 2013-05-19 ENCOUNTER — Ambulatory Visit (INDEPENDENT_AMBULATORY_CARE_PROVIDER_SITE_OTHER): Payer: Commercial Managed Care - PPO | Admitting: Physician Assistant

## 2013-05-19 ENCOUNTER — Encounter: Payer: Self-pay | Admitting: Physician Assistant

## 2013-05-19 ENCOUNTER — Telehealth: Payer: Self-pay | Admitting: Internal Medicine

## 2013-05-19 VITALS — BP 102/72 | HR 68 | Temp 97.1°F | Resp 16 | Ht 69.0 in | Wt 178.0 lb

## 2013-05-19 DIAGNOSIS — I4891 Unspecified atrial fibrillation: Secondary | ICD-10-CM

## 2013-05-19 DIAGNOSIS — E559 Vitamin D deficiency, unspecified: Secondary | ICD-10-CM

## 2013-05-19 DIAGNOSIS — E782 Mixed hyperlipidemia: Secondary | ICD-10-CM

## 2013-05-19 DIAGNOSIS — R7309 Other abnormal glucose: Secondary | ICD-10-CM

## 2013-05-19 DIAGNOSIS — Z79899 Other long term (current) drug therapy: Secondary | ICD-10-CM

## 2013-05-19 DIAGNOSIS — Z1212 Encounter for screening for malignant neoplasm of rectum: Secondary | ICD-10-CM

## 2013-05-19 DIAGNOSIS — I10 Essential (primary) hypertension: Secondary | ICD-10-CM

## 2013-05-19 LAB — CBC WITH DIFFERENTIAL/PLATELET
BASOS ABS: 0 10*3/uL (ref 0.0–0.1)
Basophils Relative: 0 % (ref 0–1)
EOS ABS: 0.1 10*3/uL (ref 0.0–0.7)
EOS PCT: 1 % (ref 0–5)
HCT: 42.2 % (ref 36.0–46.0)
Hemoglobin: 14.9 g/dL (ref 12.0–15.0)
Lymphocytes Relative: 26 % (ref 12–46)
Lymphs Abs: 2.5 10*3/uL (ref 0.7–4.0)
MCH: 30.1 pg (ref 26.0–34.0)
MCHC: 35.3 g/dL (ref 30.0–36.0)
MCV: 85.3 fL (ref 78.0–100.0)
Monocytes Absolute: 0.7 10*3/uL (ref 0.1–1.0)
Monocytes Relative: 7 % (ref 3–12)
NEUTROS PCT: 66 % (ref 43–77)
Neutro Abs: 6.3 10*3/uL (ref 1.7–7.7)
Platelets: 287 10*3/uL (ref 150–400)
RBC: 4.95 MIL/uL (ref 3.87–5.11)
RDW: 13.6 % (ref 11.5–15.5)
WBC: 9.5 10*3/uL (ref 4.0–10.5)

## 2013-05-19 LAB — HEMOGLOBIN A1C
HEMOGLOBIN A1C: 6.2 % — AB (ref <5.7)
Mean Plasma Glucose: 131 mg/dL — ABNORMAL HIGH (ref <117)

## 2013-05-19 NOTE — Telephone Encounter (Signed)
INCORRECT INFORMATION ON AVS PLEASE ADVISE PT

## 2013-05-19 NOTE — Progress Notes (Signed)
HPI 63 y.o. female  presents for 3 month follow up with hypertension, hyperlipidemia, prediabetes and vitamin D. Her blood pressure has been controlled at home, today their BP is BP: 102/72 mmHg She does workout. She denies chest pain, shortness of breath, dizziness.  She is not on cholesterol medication and denies myalgias. Her cholesterol is at goal. The cholesterol last visit was:   Lab Results  Component Value Date   CHOL 185 01/14/2013   HDL 80 01/14/2013   LDLCALC 89 01/14/2013   TRIG 80 01/14/2013   CHOLHDL 2.3 01/14/2013   She has been working on diet and exercise for prediabetes, and denies paresthesia of the feet, polydipsia and polyuria. Last A1C in the office was:  Lab Results  Component Value Date   HGBA1C 6.0* 01/14/2013   Patient is on Vitamin D supplement.   She has cut out all white stuff and decreased sugars significantly, stopped sodas and states she is feeling better. Weight has gone down significantly and this has helped her hip pain.  Wt Readings from Last 3 Encounters:  05/19/13 178 lb (80.74 kg)  03/28/13 182 lb 9.6 oz (82.827 kg)  01/14/13 194 lb 14.4 oz (88.406 kg)    Current Medications:  Current Outpatient Prescriptions on File Prior to Visit  Medication Sig Dispense Refill  . ALPRAZolam (XANAX) 1 MG tablet TAKE 0.5 TO 1 TABLET BY MOUTH THREE TIMES A DAY AS NEEDED  90 tablet  1  . Ascorbic Acid (VITAMIN C) 1000 MG tablet Take 1,000 mg by mouth daily.      Marland Kitchen aspirin 81 MG tablet Take 81 mg by mouth daily.      Marland Kitchen buPROPion (WELLBUTRIN XL) 300 MG 24 hr tablet TAKE 1 TABLET BY MOUTH EVERY DAY  30 tablet  2  . Cholecalciferol (VITAMIN D-3) 5000 UNITS TABS Take 5,000 Units by mouth 2 (two) times daily.       . cyclobenzaprine (FLEXERIL) 10 MG tablet Take 10 mg by mouth at bedtime.      . Flaxseed, Linseed, (FLAX SEED OIL PO) Take by mouth.      . hydrochlorothiazide (MICROZIDE) 12.5 MG capsule Take 12.5 mg by mouth daily.      . Magnesium 250 MG TABS Take 250 mg by  mouth daily.      . metoprolol succinate (TOPROL-XL) 50 MG 24 hr tablet TAKE 1 TABLET BY MOUTH EVERY DAY *MAX 30 DAYS ON INSURANCE*  90 tablet  1  . Multiple Vitamin (MULTIVITAMIN) tablet Take 1 tablet by mouth daily.      . Omega-3 Fatty Acids (FISH OIL PO) Take by mouth.      . Vitamin D, Ergocalciferol, (DRISDOL) 50000 UNITS CAPS capsule Take 1 capsule daily or as directed for severe Vitamin D Deficiency  30 capsule  99   No current facility-administered medications on file prior to visit.   Medical History:  Past Medical History  Diagnosis Date  . Hyperlipidemia   . Hypertension   . Pre-diabetes   . Vitamin D deficiency    Allergies:  No Active Allergies   Review of Systems: [X]  = complains of  [ ]  = denies  General: Fatigue [ ]  Fever [ ]  Chills [ ]  Weakness [ ]   Insomnia [ ]  Eyes: Redness [ ]  Blurred vision [ ]  Diplopia [ ]   ENT: Congestion [ ]  Sinus Pain [ ]  Post Nasal Drip [ ]  Sore Throat [ ]  Earache [ ]   Cardiac: Chest pain/pressure [ ]  SOB [ ]   Orthopnea [ ]   Palpitations [ ]   Paroxysmal nocturnal dyspnea[ ]  Claudication [ ]  Edema [ ]   Pulmonary: Cough [ ]  Wheezing[ ]   SOB [ ]   Snoring [ ]   GI: Nausea [ ]  Vomiting[ ]  Dysphagia[ ]  Heartburn[ ]  Abdominal pain [ ]  Constipation [ ] ; Diarrhea [ ] ; BRBPR [ ]  Melena[ ]  GU: Hematuria[ ]  Dysuria [ ]  Nocturia[ ]  Urgency [ ]   Hesitancy [ ]  Discharge [ ]  Neuro: Headaches[ ]  Vertigo[ ]  Paresthesias[ ]  Spasm [ ]  Speech changes [ ]  Incoordination [ ]   Ortho: Arthritis Valu.Nieves ] Joint pain [ ]  Muscle pain [ ]  Joint swelling [ ]  Back Pain [ ]  Skin:  Rash [ ]   Pruritis [ ]  Change in skin lesion [ ]   Psych: Depression[ ]  Anxiety[ ]  Confusion [ ]  Memory loss [ ]   Heme/Lypmh: Bleeding [ ]  Bruising [ ]  Enlarged lymph nodes [ ]   Endocrine: Visual blurring [ ]  Paresthesia [ ]  Polyuria [ ]  Polydypsea [ ]    Heat/cold intolerance [ ]  Hypoglycemia [ ]   Family history- Review and unchanged Social history- Review and unchanged Physical Exam: BP 102/72   Pulse 68  Temp(Src) 97.1 F (36.2 C)  Resp 16  Ht 5\' 9"  (1.753 m)  Wt 178 lb (80.74 kg)  BMI 26.27 kg/m2 Wt Readings from Last 3 Encounters:  05/19/13 178 lb (80.74 kg)  03/28/13 182 lb 9.6 oz (82.827 kg)  01/14/13 194 lb 14.4 oz (88.406 kg)   General Appearance: Well nourished, in no apparent distress. Eyes: PERRLA, EOMs, conjunctiva no swelling or erythema Sinuses: No Frontal/maxillary tenderness ENT/Mouth: Ext aud canals clear, TMs without erythema, bulging. No erythema, swelling, or exudate on post pharynx.  Tonsils not swollen or erythematous. Hearing normal.  Neck: Supple, thyroid normal.  Respiratory: Respiratory effort normal, BS equal bilaterally without rales, rhonchi, wheezing or stridor.  Cardio: RRR with no MRGs. Brisk peripheral pulses without edema.  Abdomen: Soft, + BS.  Non tender, no guarding, rebound, hernias, masses. Lymphatics: Non tender without lymphadenopathy.  Musculoskeletal: Full ROM, 5/5 strength, normal gait. Left head DIP 3rd and 4th hard nodules and affecting movement Skin: Warm, dry without rashes, lesions, ecchymosis.  Neuro: Cranial nerves intact. Normal muscle tone, no cerebellar symptoms. Sensation intact.  Psych: Awake and oriented X 3, normal affect, Insight and Judgment appropriate.   Assessment and Plan:  Hypertension: Continue medication, monitor blood pressure at home. Continue DASH diet. Cholesterol: Continue diet and exercise. Check cholesterol.  Pre-diabetes-Continue diet and exercise. Check A1C Vitamin D Def- check level and continue medications.  pAfib- NSR, no symptoms, controlled.  OA- follow up with Ortho  Continue diet and meds as discussed. Further disposition pending results of labs.  Taylor Li 10:13 AM

## 2013-05-19 NOTE — Patient Instructions (Signed)
What is the TMJ? The temporomandibular (tem-PUH-ro-man-DIB-yoo-ler) joint, or the TMJ, connects the upper and lower jawbones. This joint allows the jaw to open wide and move back and forth when you chew, talk, or yawn.There are also several muscles that help this joint move. There can be muscle tightness and pain in the muscle that can cause several symptoms.  What causes TMJ pain? There are many causes of TMJ pain. Repeated chewing (for example, chewing gum) and clenching your teeth can cause pain in the joint. Some TMJ pain has no obvious cause. What can I do to ease the pain? There are many things you can do to help your pain get better. When you have pain:  Eat soft foods and stay away from chewy foods (for example, taffy) Try to use both sides of your mouth to chew Don't chew gum Don't open your mouth wide (for example, during yawning or singing) Don't bite your cheeks or fingernails Lower your amount of stress and worry Applying a warm, damp washcloth to the joint may help. Over-the-counter pain medicines such as ibuprofen (one brand: Advil) or acetaminophen (one brand: Tylenol) might also help. Do not use these medicines if you are allergic to them or if your doctor told you not to use them. How can I stop the pain from coming back? When your pain is better, you can do these exercises to make your muscles stronger and to keep the pain from coming back:  Resisted mouth opening: Place your thumb or two fingers under your chin and open your mouth slowly, pushing up lightly on your chin with your thumb. Hold for three to six seconds. Close your mouth slowly. Resisted mouth closing: Place your thumbs under your chin and your two index fingers on the ridge between your mouth and the bottom of your chin. Push down lightly on your chin as you close your mouth. Tongue up: Slowly open and close your mouth while keeping the tongue touching the roof of the mouth. Side-to-side jaw movement: Place an  object about one fourth of an inch thick (for example, two tongue depressors) between your front teeth. Slowly move your jaw from side to side. Increase the thickness of the object as the exercise becomes easier Forward jaw movement: Place an object about one fourth of an inch thick between your front teeth and move the bottom jaw forward so that the bottom teeth are in front of the top teeth. Increase the thickness of the object as the exercise becomes easier. These exercises should not be painful. If it hurts to do these exercises, stop doing them and talk to your family doctor.     Bad carbs also include fruit juice, alcohol, and sweet tea. These are empty calories that do not signal to your brain that you are full.   Please remember the good carbs are still carbs which convert into sugar. So please measure them out no more than 1/2-1 cup of rice, oatmeal, pasta, and beans.  Veggies are however free foods! Pile them on.   I like lean protein at every meal such as chicken, turkey, pork chops, cottage cheese, etc. Just do not fry these meats and please center your meal around vegetable, the meats should be a side dish.   No all fruit is created equal. Please see the list below, the fruit at the bottom is higher in sugars than the fruit at the top    

## 2013-05-20 LAB — HEPATIC FUNCTION PANEL
ALBUMIN: 4.4 g/dL (ref 3.5–5.2)
ALT: 26 U/L (ref 0–35)
AST: 23 U/L (ref 0–37)
Alkaline Phosphatase: 64 U/L (ref 39–117)
BILIRUBIN DIRECT: 0.1 mg/dL (ref 0.0–0.3)
Indirect Bilirubin: 0.6 mg/dL (ref 0.2–1.2)
Total Bilirubin: 0.7 mg/dL (ref 0.2–1.2)
Total Protein: 7 g/dL (ref 6.0–8.3)

## 2013-05-20 LAB — BASIC METABOLIC PANEL WITH GFR
BUN: 13 mg/dL (ref 6–23)
CALCIUM: 9.7 mg/dL (ref 8.4–10.5)
CO2: 28 mEq/L (ref 19–32)
CREATININE: 0.81 mg/dL (ref 0.50–1.10)
Chloride: 100 mEq/L (ref 96–112)
GFR, EST NON AFRICAN AMERICAN: 79 mL/min
Glucose, Bld: 104 mg/dL — ABNORMAL HIGH (ref 70–99)
Potassium: 4.8 mEq/L (ref 3.5–5.3)
SODIUM: 139 meq/L (ref 135–145)

## 2013-05-20 LAB — LIPID PANEL
CHOL/HDL RATIO: 2.2 ratio
CHOLESTEROL: 164 mg/dL (ref 0–200)
HDL: 73 mg/dL (ref >39)
LDL Cholesterol: 76 mg/dL (ref 0–99)
TRIGLYCERIDES: 76 mg/dL (ref <150)
VLDL: 15 mg/dL (ref 0–40)

## 2013-05-20 LAB — MAGNESIUM: MAGNESIUM: 2.1 mg/dL (ref 1.5–2.5)

## 2013-05-20 LAB — TSH: TSH: 1.014 u[IU]/mL (ref 0.350–4.500)

## 2013-05-20 LAB — VITAMIN D 25 HYDROXY (VIT D DEFICIENCY, FRACTURES): VIT D 25 HYDROXY: 115 ng/mL — AB (ref 30–89)

## 2013-05-20 LAB — INSULIN, FASTING: Insulin fasting, serum: 7 u[IU]/mL (ref 3–28)

## 2013-05-20 NOTE — Telephone Encounter (Signed)
We will fix this at next visit.

## 2013-05-20 NOTE — Telephone Encounter (Signed)
Per Vicie Mutters, PA called patient and advised via message that we will correct any incorrect information at her next visit

## 2013-06-09 ENCOUNTER — Encounter: Payer: Self-pay | Admitting: Internal Medicine

## 2013-06-18 ENCOUNTER — Other Ambulatory Visit: Payer: Self-pay | Admitting: Internal Medicine

## 2013-06-18 ENCOUNTER — Other Ambulatory Visit: Payer: Self-pay | Admitting: Emergency Medicine

## 2013-06-18 ENCOUNTER — Other Ambulatory Visit: Payer: Self-pay | Admitting: Physician Assistant

## 2013-06-22 ENCOUNTER — Telehealth: Payer: Self-pay | Admitting: *Deleted

## 2013-06-22 NOTE — Telephone Encounter (Signed)
Patient called and requested advise on a surgeon to remove her breast implants and then reinsert new ones.  Per Dr Melford Aase, try contacting Dr Darla Lesches.  Left message to inform patient,.

## 2013-07-05 ENCOUNTER — Encounter: Payer: Self-pay | Admitting: Internal Medicine

## 2013-07-19 ENCOUNTER — Other Ambulatory Visit: Payer: Self-pay | Admitting: Physician Assistant

## 2013-08-11 ENCOUNTER — Other Ambulatory Visit: Payer: Self-pay | Admitting: Internal Medicine

## 2013-08-17 ENCOUNTER — Other Ambulatory Visit: Payer: Self-pay | Admitting: Physician Assistant

## 2013-08-18 ENCOUNTER — Ambulatory Visit: Payer: Self-pay | Admitting: Internal Medicine

## 2013-08-24 ENCOUNTER — Other Ambulatory Visit: Payer: Self-pay | Admitting: Internal Medicine

## 2013-09-02 ENCOUNTER — Other Ambulatory Visit: Payer: Self-pay | Admitting: Physician Assistant

## 2013-09-02 ENCOUNTER — Ambulatory Visit (AMBULATORY_SURGERY_CENTER): Payer: Self-pay | Admitting: *Deleted

## 2013-09-02 VITALS — Ht 69.0 in | Wt 185.0 lb

## 2013-09-02 DIAGNOSIS — Z1211 Encounter for screening for malignant neoplasm of colon: Secondary | ICD-10-CM

## 2013-09-02 MED ORDER — NA SULFATE-K SULFATE-MG SULF 17.5-3.13-1.6 GM/177ML PO SOLN: 1.0000 | Freq: Once | ORAL | Status: DC

## 2013-09-02 NOTE — Progress Notes (Signed)
No egg or soy allergy. No anesthesia problems.  No home O2.  No diet meds.  

## 2013-09-09 ENCOUNTER — Encounter: Payer: Self-pay | Admitting: Internal Medicine

## 2013-09-09 ENCOUNTER — Ambulatory Visit (INDEPENDENT_AMBULATORY_CARE_PROVIDER_SITE_OTHER): Payer: Commercial Managed Care - PPO | Admitting: Internal Medicine

## 2013-09-09 ENCOUNTER — Encounter (INDEPENDENT_AMBULATORY_CARE_PROVIDER_SITE_OTHER): Payer: Self-pay

## 2013-09-09 VITALS — BP 118/64 | HR 60 | Temp 98.2°F | Resp 18 | Ht 69.0 in | Wt 185.0 lb

## 2013-09-09 DIAGNOSIS — E559 Vitamin D deficiency, unspecified: Secondary | ICD-10-CM

## 2013-09-09 DIAGNOSIS — I10 Essential (primary) hypertension: Secondary | ICD-10-CM

## 2013-09-09 DIAGNOSIS — E782 Mixed hyperlipidemia: Secondary | ICD-10-CM

## 2013-09-09 DIAGNOSIS — Z113 Encounter for screening for infections with a predominantly sexual mode of transmission: Secondary | ICD-10-CM

## 2013-09-09 DIAGNOSIS — R21 Rash and other nonspecific skin eruption: Secondary | ICD-10-CM

## 2013-09-09 DIAGNOSIS — Z79899 Other long term (current) drug therapy: Secondary | ICD-10-CM | POA: Insufficient documentation

## 2013-09-09 DIAGNOSIS — R7309 Other abnormal glucose: Secondary | ICD-10-CM

## 2013-09-09 LAB — CBC WITH DIFFERENTIAL/PLATELET
BASOS ABS: 0 10*3/uL (ref 0.0–0.1)
Basophils Relative: 0 % (ref 0–1)
Eosinophils Absolute: 0.1 10*3/uL (ref 0.0–0.7)
Eosinophils Relative: 2 % (ref 0–5)
HCT: 39.8 % (ref 36.0–46.0)
Hemoglobin: 13.7 g/dL (ref 12.0–15.0)
Lymphocytes Relative: 37 % (ref 12–46)
Lymphs Abs: 2.5 10*3/uL (ref 0.7–4.0)
MCH: 29.5 pg (ref 26.0–34.0)
MCHC: 34.4 g/dL (ref 30.0–36.0)
MCV: 85.6 fL (ref 78.0–100.0)
Monocytes Absolute: 0.6 10*3/uL (ref 0.1–1.0)
Monocytes Relative: 9 % (ref 3–12)
NEUTROS ABS: 3.5 10*3/uL (ref 1.7–7.7)
NEUTROS PCT: 52 % (ref 43–77)
Platelets: 292 10*3/uL (ref 150–400)
RBC: 4.65 MIL/uL (ref 3.87–5.11)
RDW: 13.3 % (ref 11.5–15.5)
WBC: 6.7 10*3/uL (ref 4.0–10.5)

## 2013-09-09 NOTE — Patient Instructions (Addendum)

## 2013-09-09 NOTE — Progress Notes (Signed)
Patient ID: Taylor Li, female   DOB: 04/25/1950, 63 y.o.   MRN: [id number]   This very nice 63 y.o.MWF presents for 3 month follow up with Hypertension, Hyperlipidemia, Pre-Diabetes and Vitamin D Deficiency. Patient also describes an fleeting rash that seems to appear when she becomes overheated. She did have a tick she removed several weeks ago. She's had no unusual arthralgias.    Patient is treated for HTN & BP has been controlled at home. Today's BP: 118/64 mmHg. Patient denies any cardiac type chest pain, palpitations, dyspnea/orthopnea/PND, dizziness, claudication, or dependent edema.   Hyperlipidemia is controlled with diet & meds. Patient denies myalgias or other med SE's. Last Lipids were  Chol 164; HDL 73; LDL 76; Trig 76 on 05/19/2013   Also, the patient has history of PreDiabetes and patient denies any symptoms of reactive hypoglycemia, diabetic polys, paresthesias or visual blurring.  Last A1c was 6.2% on  05/19/2013.    Further, Patient has history of Vitamin D Deficiency and patient supplements vitamin D without any suspected side-effects. Last vitamin D was 115 on 05/19/2013 and she was advised to taper her dose by one 50K tab/week.    Medication List   ALPRAZolam 1 MG tablet  Commonly known as:  XANAX  TAKE 1/2 TO 1 TABLET 3 TIMES A DAY AS NEEDED     aspirin 81 MG tablet  Take 81 mg by mouth daily.     buPROPion 300 MG 24 hr tablet  Commonly known as:  WELLBUTRIN XL  TAKE 1 TABLET BY MOUTH EVERY DAY     citalopram 20 MG tablet  Commonly known as:  CELEXA  Take 20 mg by mouth daily.     cyclobenzaprine 10 MG tablet  Commonly known as:  FLEXERIL  TAKE 1/2 TO 1 TABLET BY MOUTH 3 TIMES A DAY AS NEEDED AS NEEDED FOR MUSCLE SPASM     FISH OIL PO  Take by mouth.     FLAX SEED OIL PO  Take by mouth.     hydrochlorothiazide 12.5 MG capsule  Commonly known as:  MICROZIDE  TAKE ONE CAPSULE BY MOUTH EVERY DAY     Magnesium 250 MG Tabs  Take 250 mg by mouth daily.     metoprolol succinate 50 MG 24 hr tablet  Commonly known as:  TOPROL-XL  TAKE 1 TABLET BY MOUTH EVERY DAY *MAX 30 DAYS ON INSURANCE*     multivitamin tablet  Take 1 tablet by mouth daily.     Na Sulfate-K Sulfate-Mg Sulf Soln  Commonly known as:  SUPREP BOWEL PREP  Take 1 kit by mouth once. Name brand only, suprep as directed, no substitutions     vitamin C 1000 MG tablet  Take 1,000 mg by mouth daily.     Vitamin D (Ergocalciferol) 50000 UNITS Caps capsule  Commonly known as:  DRISDOL  Take 1 capsule daily or as directed for severe Vitamin D Deficiency     No Known Allergies  PMHx:   Past Medical History  Diagnosis Date  . Hyperlipidemia   . Hypertension   . Pre-diabetes   . Vitamin D deficiency   . Arthritis   . A-fib    FHx:    Reviewed / unchanged SHx:    Reviewed / unchanged  Systems Review:  Constitutional: Denies fever, chills, wt changes, headaches, insomnia, fatigue, night sweats, change in appetite. Eyes: Denies redness, blurred vision, diplopia, discharge, itchy, watery eyes.  ENT: Denies discharge, congestion, post nasal drip, epistaxis, sore throat,  earache, hearing loss, dental pain, tinnitus, vertigo, sinus pain, snoring.  CV: Denies chest pain, palpitations, irregular heartbeat, syncope, dyspnea, diaphoresis, orthopnea, PND, claudication or edema. Respiratory: denies cough, dyspnea, DOE, pleurisy, hoarseness, laryngitis, wheezing.  Gastrointestinal: Denies dysphagia, odynophagia, heartburn, reflux, water brash, abdominal pain or cramps, nausea, vomiting, bloating, diarrhea, constipation, hematemesis, melena, hematochezia  or hemorrhoids. Genitourinary: Denies dysuria, frequency, urgency, nocturia, hesitancy, discharge, hematuria or flank pain. Musculoskeletal: Denies arthralgias, myalgias, stiffness, jt. swelling, pain, limping or strain/sprain.  Skin: Denies pruritus, rash, hives, warts, acne, eczema or change in skin lesion(s). Neuro: No weakness, tremor,  incoordination, spasms, paresthesia or pain. Psychiatric: Denies confusion, memory loss or sensory loss. Endo: Denies change in weight, skin or hair change.  Heme/Lymph: No excessive bleeding, bruising or enlarged lymph nodes.  Exam:  BP 118/64  Pulse 60  Temp(Src) 98.2 F (36.8 C) (Temporal)  Resp 18  Ht _0  (1.753 m)  Wt 185 lb (83.915 kg)  BMI 27.31 kg/m2  Appears well nourished and in no distress. Eyes: PERRLA, EOMs, conjunctiva no swelling or erythema. Sinuses: No frontal/maxillary tenderness ENT/Mouth: EAC's clear, TM's nl w/o erythema, bulging. Nares clear w/o erythema, swelling, exudates. Oropharynx clear without erythema or exudates. Oral hygiene is good. Tongue normal, non obstructing. Hearing intact.  Neck: Supple. Thyroid nl. Car 2+/2+ without bruits, nodes or JVD. Chest: Respirations nl with BS clear & equal w/o rales, rhonchi, wheezing or stridor.  Cor: Heart sounds normal w/ regular rate and rhythm without sig. murmurs, gallops, clicks, or rubs. Peripheral pulses normal and equal  without edema.  Abdomen: Soft & bowel sounds normal. Non-tender w/o guarding, rebound, hernias, masses, or organomegaly.  Lymphatics: Unremarkable.  Musculoskeletal: Full ROM all peripheral extremities, joint stability, 5/5 strength, and normal gait.  Skin: Warm, dry without rashes, lesions or ecchymosis apparent.  Neuro: Cranial nerves intact, reflexes equal bilaterally. Sensory-motor testing grossly intact. Tendon reflexes grossly intact.  Pysch: Alert & oriented x 3.  Insight and judgement nl & appropriate. No ideations.  Assessment and Plan:  1. Hypertension - Continue monitor blood pressure at home. Continue diet/meds same.  2. Hyperlipidemia - Continue diet/meds, exercise,& lifestyle modifications. Continue monitor periodic cholesterol/liver & renal functions   3. Pre-Diabetes - Continue diet, exercise, lifestyle modifications. Monitor appropriate labs.  4. Vitamin D Deficiency -  Continue supplementation.  Recommended regular exercise, BP monitoring, weight control, and discussed med and SE's. Recommended labs to assess and monitor clinical status. Further disposition pending results of labs.

## 2013-09-10 LAB — HEPATIC FUNCTION PANEL
ALT: 29 U/L (ref 0–35)
AST: 23 U/L (ref 0–37)
Albumin: 4.2 g/dL (ref 3.5–5.2)
Alkaline Phosphatase: 71 U/L (ref 39–117)
BILIRUBIN INDIRECT: 0.3 mg/dL (ref 0.2–1.2)
Bilirubin, Direct: 0.1 mg/dL (ref 0.0–0.3)
TOTAL PROTEIN: 6.7 g/dL (ref 6.0–8.3)
Total Bilirubin: 0.4 mg/dL (ref 0.2–1.2)

## 2013-09-10 LAB — VITAMIN D 25 HYDROXY (VIT D DEFICIENCY, FRACTURES): Vit D, 25-Hydroxy: 109 ng/mL — ABNORMAL HIGH (ref 30–89)

## 2013-09-10 LAB — BASIC METABOLIC PANEL WITH GFR
BUN: 14 mg/dL (ref 6–23)
CO2: 26 mEq/L (ref 19–32)
Calcium: 9.4 mg/dL (ref 8.4–10.5)
Chloride: 103 mEq/L (ref 96–112)
Creat: 0.72 mg/dL (ref 0.50–1.10)
Glucose, Bld: 103 mg/dL — ABNORMAL HIGH (ref 70–99)
POTASSIUM: 4.2 meq/L (ref 3.5–5.3)
SODIUM: 138 meq/L (ref 135–145)

## 2013-09-10 LAB — LIPID PANEL
CHOLESTEROL: 158 mg/dL (ref 0–200)
HDL: 72 mg/dL (ref >39)
LDL Cholesterol: 73 mg/dL (ref 0–99)
TRIGLYCERIDES: 63 mg/dL (ref <150)
Total CHOL/HDL Ratio: 2.2 Ratio
VLDL: 13 mg/dL (ref 0–40)

## 2013-09-10 LAB — INSULIN, FASTING: Insulin fasting, serum: 7.2 u[IU]/mL (ref 2.0–19.6)

## 2013-09-10 LAB — RPR

## 2013-09-10 LAB — HEMOGLOBIN A1C
Hgb A1c MFr Bld: 5.8 % — ABNORMAL HIGH (ref <5.7)
MEAN PLASMA GLUCOSE: 120 mg/dL — AB (ref <117)

## 2013-09-10 LAB — TSH: TSH: 0.63 u[IU]/mL (ref 0.350–4.500)

## 2013-09-10 LAB — HIV ANTIBODY (ROUTINE TESTING W REFLEX): HIV 1&2 Ab, 4th Generation: NONREACTIVE

## 2013-09-10 LAB — MAGNESIUM: Magnesium: 1.9 mg/dL (ref 1.5–2.5)

## 2013-09-11 ENCOUNTER — Encounter: Payer: Self-pay | Admitting: Internal Medicine

## 2013-09-12 ENCOUNTER — Telehealth: Payer: Self-pay | Admitting: *Deleted

## 2013-09-12 LAB — LYME ABY, WSTRN BLT IGG & IGM W/BANDS
B BURGDORFERI IGG ABS (IB): NEGATIVE
B burgdorferi IgM Abs (IB): NEGATIVE
LYME DISEASE 45 KD IGG: NONREACTIVE
LYME DISEASE 58 KD IGG: NONREACTIVE
LYME DISEASE 66 KD IGG: NONREACTIVE
LYME DISEASE 93 KD IGG: NONREACTIVE
Lyme Disease 18 kD IgG: NONREACTIVE
Lyme Disease 23 kD IgG: NONREACTIVE
Lyme Disease 23 kD IgM: NONREACTIVE
Lyme Disease 28 kD IgG: REACTIVE — AB
Lyme Disease 30 kD IgG: NONREACTIVE
Lyme Disease 39 kD IgG: NONREACTIVE
Lyme Disease 39 kD IgM: NONREACTIVE
Lyme Disease 41 kD IgG: NONREACTIVE
Lyme Disease 41 kD IgM: NONREACTIVE

## 2013-09-12 LAB — ROCKY MTN SPOTTED FVR ABS PNL(IGG+IGM)
RMSF IGG: 0.1 IV
RMSF IGM: 0.19 IV

## 2013-09-12 MED ORDER — HYDROXYZINE HCL 25 MG PO TABS: ORAL_TABLET | ORAL | Status: DC

## 2013-09-12 NOTE — Telephone Encounter (Signed)
Patient called and said her rash is itching and can not take Benadrly.. I informed her that her lab results were not final.  She asked for RX for itching.  OK to send RX for hydroxyzine per Dr Melford Aase.

## 2013-09-13 ENCOUNTER — Encounter: Payer: Self-pay | Admitting: Internal Medicine

## 2013-09-16 ENCOUNTER — Ambulatory Visit (AMBULATORY_SURGERY_CENTER): Payer: Commercial Managed Care - PPO | Admitting: Internal Medicine

## 2013-09-16 ENCOUNTER — Encounter: Payer: Self-pay | Admitting: Internal Medicine

## 2013-09-16 VITALS — BP 140/101 | HR 66 | Temp 97.5°F | Resp 22 | Ht 69.0 in | Wt 185.0 lb

## 2013-09-16 DIAGNOSIS — Z1211 Encounter for screening for malignant neoplasm of colon: Secondary | ICD-10-CM

## 2013-09-16 DIAGNOSIS — D126 Benign neoplasm of colon, unspecified: Secondary | ICD-10-CM

## 2013-09-16 DIAGNOSIS — D125 Benign neoplasm of sigmoid colon: Secondary | ICD-10-CM

## 2013-09-16 DIAGNOSIS — D120 Benign neoplasm of cecum: Secondary | ICD-10-CM

## 2013-09-16 MED ORDER — SODIUM CHLORIDE 0.9 % IV SOLN: 500.0000 mL | INTRAVENOUS | Status: DC

## 2013-09-16 NOTE — Progress Notes (Signed)
Called to room to assist during endoscopic procedure.  Patient ID and intended procedure confirmed with present staff. Received instructions for my participation in the procedure from the performing physician.  

## 2013-09-16 NOTE — Progress Notes (Signed)
Report to PACU, RN, vss, BBS= Clear.  

## 2013-09-16 NOTE — Patient Instructions (Signed)
YOU HAD AN ENDOSCOPIC PROCEDURE TODAY AT THE [facility]: Refer to the procedure report that was given to you for any specific questions about what was found during the examination.  If the procedure report does not answer your questions, please call your gastroenterologist to clarify.  If you requested that your care partner not be given the details of your procedure findings, then the procedure report has been included in a sealed envelope for you to review at your convenience later.  YOU SHOULD EXPECT: Some feelings of bloating in the abdomen. Passage of more gas than usual.  Walking can help get rid of the air that was put into your GI tract during the procedure and reduce the bloating. If you had a lower endoscopy (such as a colonoscopy or flexible sigmoidoscopy) you may notice spotting of blood in your stool or on the toilet paper. If you underwent a bowel prep for your procedure, then you may not have a normal bowel movement for a few days.  DIET: Your first meal following the procedure should be a light meal and then it is ok to progress to your normal diet.  A half-sandwich or bowl of soup is an example of a good first meal.  Heavy or fried foods are harder to digest and may make you feel nauseous or bloated.  Likewise meals heavy in dairy and vegetables can cause extra gas to form and this can also increase the bloating.  Drink plenty of fluids but you should avoid alcoholic beverages for 24 hours.  ACTIVITY: Your care partner should take you home directly after the procedure.  You should plan to take it easy, moving slowly for the rest of the day.  You can resume normal activity the day after the procedure however you should NOT DRIVE or use heavy machinery for 24 hours (because of the sedation medicines used during the test).    SYMPTOMS TO REPORT IMMEDIATELY: A gastroenterologist can be reached at any hour.  During normal business hours, 8:30 AM to 5:00 PM Monday through Friday,  call [phone].  After hours and on weekends, please call the GI answering service at [phone] who will take a message and have the physician on call contact you.   Following lower endoscopy (colonoscopy or flexible sigmoidoscopy):  Excessive amounts of blood in the stool  Significant tenderness or worsening of abdominal pains  Swelling of the abdomen that is new, acute  Fever of 100F or higher    FOLLOW UP: If any biopsies were taken you will be contacted by phone or by letter within the next 1-3 weeks.  Call your gastroenterologist if you have not heard about the biopsies in 3 weeks.  Our staff will call the home number listed on your records the next business day following your procedure to check on you and address any questions or concerns that you may have at that time regarding the information given to you following your procedure. This is a courtesy call and so if there is no answer at the home number and we have not heard from you through the emergency physician on call, we will assume that you have returned to your regular daily activities without incident.  SIGNATURES/CONFIDENTIALITY: You and/or your care partner have signed paperwork which will be entered into your electronic medical record.  These signatures attest to the fact that that the information above on your After Visit Summary has been reviewed and is understood.  Full responsibility of the confidentiality   of this discharge information lies with you and/or your care-partner.   Information on polyps given to you today  Await pathology report

## 2013-09-16 NOTE — Op Note (Signed)
[place]  [address] [place], [address]   COLONOSCOPY PROCEDURE REPORT  PATIENT: Taylor Li, Taylor Li  MR#: [id number] BIRTHDATE: 15-Oct-1950 , 63  yrs. old GENDER: Female ENDOSCOPIST: Gatha Mayer, MD, [facility] PROCEDURE DATE:  09/16/2013 PROCEDURE:   Colonoscopy with biopsy and snare polypectomy First Screening Colonoscopy - Avg.  risk and is 50 yrs.  old or older - No.  Prior Negative Screening - Now for repeat screening. 10 or more years since last screening  History of Adenoma - Now for follow-up colonoscopy & has been > or = to 3 yrs.  N/A  Polyps Removed Today? Yes. ASA CLASS:   Class II INDICATIONS:average risk screening and Last colonoscopy performed 10 years ago. MEDICATIONS: propofol (Diprivan) 350mg  IV, These medications were titrated to patient response per physician's verbal order, and MAC sedation, administered by CRNA  DESCRIPTION OF PROCEDURE:   After the risks benefits and alternatives of the procedure were thoroughly explained, informed consent was obtained.  A digital rectal exam revealed no abnormalities of the rectum.   The LB [id number]  endoscope was introduced through the anus and advanced to the cecum, which was identified by both the appendix and ileocecal valve. No adverse events experienced.   The quality of the prep was excellent using Suprep  The instrument was then slowly withdrawn as the colon was fully examined.  COLON FINDINGS: Two sessile polyps measuring 3 and 5 mm in size were found at the cecum and in the sigmoid colon.  A polypectomy was performed with cold forceps and with a cold snare.  The resection was complete and the polyp tissue was completely retrieved.   The colon mucosa was otherwise normal.   A right colon retroflexion was performed.  Retroflexed views revealed no abnormalities. The time to cecum=2 minutes 20 seconds.  Withdrawal time=10 minutes 23 seconds.  The scope was withdrawn and the procedure  completed. COMPLICATIONS: There were no complications.  ENDOSCOPIC IMPRESSION: 1.   Two sessile polyps measuring 3 and 5 mm in size were found at the cecum and in the sigmoid colon; polypectomy was performed with cold forceps and with a cold snare 2.   The colon mucosa was otherwise normal - excellent prep  RECOMMENDATIONS: Timing of repeat colonoscopy will be determined by pathology findings.   eSigned:  Gatha Mayer, MD, [facility] 09/16/2013 1:58 PM   cc: Unk Pinto, MD and The Patient

## 2013-09-20 ENCOUNTER — Telehealth: Payer: Self-pay

## 2013-09-20 NOTE — Telephone Encounter (Signed)
  Follow up Call-  Call back number 09/16/2013  Post procedure Call Back phone  # [phone]  Permission to leave phone message Yes     Patient questions:  Do you have a fever, pain , or abdominal swelling? No. Pain Score  0 *  Have you tolerated food without any problems? Yes.    Have you been able to return to your normal activities? Yes.    Do you have any questions about your discharge instructions: Diet   No. Medications  No. Follow up visit  No.  Do you have questions or concerns about your Care? No.  Actions: * If pain score is 4 or above: No action needed, pain <4.  No problems per the pt. maw

## 2013-09-22 ENCOUNTER — Encounter: Payer: Self-pay | Admitting: Internal Medicine

## 2013-09-22 DIAGNOSIS — Z860100 Personal history of colon polyps, unspecified: Secondary | ICD-10-CM

## 2013-09-22 HISTORY — DX: Personal history of colonic polyps: Z86.010

## 2013-09-22 HISTORY — DX: Personal history of colon polyps, unspecified: Z86.0100

## 2013-09-22 NOTE — Progress Notes (Signed)
Quick Note:  Diminutive adenoma and diminutive hyperplastic - repeat colon 2020 ______

## 2013-11-01 ENCOUNTER — Other Ambulatory Visit: Payer: Self-pay | Admitting: Plastic Surgery

## 2013-11-21 ENCOUNTER — Other Ambulatory Visit: Payer: Self-pay | Admitting: Physician Assistant

## 2013-11-23 ENCOUNTER — Other Ambulatory Visit: Payer: Self-pay | Admitting: Physician Assistant

## 2013-12-05 ENCOUNTER — Other Ambulatory Visit: Payer: Self-pay | Admitting: *Deleted

## 2013-12-05 MED ORDER — HYDROCHLOROTHIAZIDE 12.5 MG PO CAPS: 12.5000 mg | ORAL_CAPSULE | Freq: Every day | ORAL | Status: DC

## 2013-12-06 ENCOUNTER — Encounter: Payer: Self-pay | Admitting: Physician Assistant

## 2013-12-06 ENCOUNTER — Ambulatory Visit (INDEPENDENT_AMBULATORY_CARE_PROVIDER_SITE_OTHER): Payer: Commercial Managed Care - PPO | Admitting: Physician Assistant

## 2013-12-06 ENCOUNTER — Ambulatory Visit: Payer: Self-pay | Admitting: Emergency Medicine

## 2013-12-06 VITALS — BP 122/80 | HR 66 | Temp 98.2°F | Resp 18 | Ht 69.0 in | Wt 187.0 lb

## 2013-12-06 DIAGNOSIS — I480 Paroxysmal atrial fibrillation: Secondary | ICD-10-CM

## 2013-12-06 DIAGNOSIS — F329 Major depressive disorder, single episode, unspecified: Secondary | ICD-10-CM | POA: Insufficient documentation

## 2013-12-06 DIAGNOSIS — M62838 Other muscle spasm: Secondary | ICD-10-CM

## 2013-12-06 DIAGNOSIS — I10 Essential (primary) hypertension: Secondary | ICD-10-CM

## 2013-12-06 DIAGNOSIS — F419 Anxiety disorder, unspecified: Secondary | ICD-10-CM

## 2013-12-06 DIAGNOSIS — E559 Vitamin D deficiency, unspecified: Secondary | ICD-10-CM

## 2013-12-06 DIAGNOSIS — R21 Rash and other nonspecific skin eruption: Secondary | ICD-10-CM

## 2013-12-06 DIAGNOSIS — R7303 Prediabetes: Secondary | ICD-10-CM

## 2013-12-06 DIAGNOSIS — Z79899 Other long term (current) drug therapy: Secondary | ICD-10-CM

## 2013-12-06 DIAGNOSIS — E782 Mixed hyperlipidemia: Secondary | ICD-10-CM

## 2013-12-06 DIAGNOSIS — F32A Depression, unspecified: Secondary | ICD-10-CM | POA: Insufficient documentation

## 2013-12-06 LAB — CBC WITH DIFFERENTIAL/PLATELET
BASOS ABS: 0 10*3/uL (ref 0.0–0.1)
BASOS PCT: 0 % (ref 0–1)
EOS PCT: 1 % (ref 0–5)
Eosinophils Absolute: 0.1 10*3/uL (ref 0.0–0.7)
HCT: 42.2 % (ref 36.0–46.0)
Hemoglobin: 14.2 g/dL (ref 12.0–15.0)
Lymphocytes Relative: 32 % (ref 12–46)
Lymphs Abs: 2.6 10*3/uL (ref 0.7–4.0)
MCH: 29.9 pg (ref 26.0–34.0)
MCHC: 33.6 g/dL (ref 30.0–36.0)
MCV: 88.8 fL (ref 78.0–100.0)
MPV: 10.6 fL (ref 9.4–12.4)
Monocytes Absolute: 0.7 10*3/uL (ref 0.1–1.0)
Monocytes Relative: 8 % (ref 3–12)
Neutro Abs: 4.8 10*3/uL (ref 1.7–7.7)
Neutrophils Relative %: 59 % (ref 43–77)
PLATELETS: 259 10*3/uL (ref 150–400)
RBC: 4.75 MIL/uL (ref 3.87–5.11)
RDW: 13.2 % (ref 11.5–15.5)
WBC: 8.2 10*3/uL (ref 4.0–10.5)

## 2013-12-06 LAB — HEPATIC FUNCTION PANEL
ALBUMIN: 4.3 g/dL (ref 3.5–5.2)
ALT: 20 U/L (ref 0–35)
AST: 19 U/L (ref 0–37)
Alkaline Phosphatase: 75 U/L (ref 39–117)
Bilirubin, Direct: 0.1 mg/dL (ref 0.0–0.3)
Indirect Bilirubin: 0.3 mg/dL (ref 0.2–1.2)
Total Bilirubin: 0.4 mg/dL (ref 0.2–1.2)
Total Protein: 6.6 g/dL (ref 6.0–8.3)

## 2013-12-06 LAB — BASIC METABOLIC PANEL WITH GFR
BUN: 13 mg/dL (ref 6–23)
CALCIUM: 9.1 mg/dL (ref 8.4–10.5)
CO2: 29 mEq/L (ref 19–32)
Chloride: 103 mEq/L (ref 96–112)
Creat: 0.79 mg/dL (ref 0.50–1.10)
GFR, EST NON AFRICAN AMERICAN: 81 mL/min
Glucose, Bld: 102 mg/dL — ABNORMAL HIGH (ref 70–99)
POTASSIUM: 3.8 meq/L (ref 3.5–5.3)
Sodium: 139 mEq/L (ref 135–145)

## 2013-12-06 LAB — LIPID PANEL
CHOLESTEROL: 178 mg/dL (ref 0–200)
HDL: 76 mg/dL (ref >39)
LDL Cholesterol: 87 mg/dL (ref 0–99)
Total CHOL/HDL Ratio: 2.3 Ratio
Triglycerides: 74 mg/dL (ref <150)
VLDL: 15 mg/dL (ref 0–40)

## 2013-12-06 LAB — HEMOGLOBIN A1C
Hgb A1c MFr Bld: 5.6 % (ref <5.7)
Mean Plasma Glucose: 114 mg/dL (ref <117)

## 2013-12-06 MED ORDER — CYCLOBENZAPRINE HCL 10 MG PO TABS: ORAL_TABLET | ORAL | Status: DC

## 2013-12-06 MED ORDER — ALPRAZOLAM 1 MG PO TABS: ORAL_TABLET | ORAL | Status: DC

## 2013-12-06 MED ORDER — METOPROLOL SUCCINATE ER 50 MG PO TB24: ORAL_TABLET | ORAL | Status: DC

## 2013-12-06 NOTE — Patient Instructions (Addendum)
-  continue medications as prescribed. -will reorder Lyme disease test and message you with results. Please keep appt for 03/13/14 for physical.  GIVE PT FOOD CHOICE LISTS FOR MEAL PLANNING  1)The amount of food you eat is important  -Too much can increase glucose levels and cause you to gain weight (which can  also increase glucose levels.  -Too little can decrease glucose level to unsafe levels (<70) 2)Eat meals and snacks at the same time each day to help your diabetes medication help you.  If you eat at different times each day, then the medication will not be as effective. 3)Do NOT skip meals or eat meals later than usual.  If you skip meals, then your glucose level can go low (<70).  Eating meals later than usual will not help the medication work effectively. 4) Amount of carbohydrates (carbs) per meal  -Breakfast- 30-45 grams of carbs  -Lunch and Dinner- 45-60 grams of carbs  -Snacks- 15-30 grams of carbs 5)Low fat foods have no more than 3 grams of fat per serving.  -Saturated- look for less than 1 gram per serving  -Trans Fat- look for 0 grams per serving 6)Exercise at least 120 minutes per week  Exercise Benefits:   -Lower LDL (Bad cholesterol)   -Lower blood pressure   -Increase HDL (Good cholesterol)   -Strengthen heart, lungs and muscles   -Burn calories and relieve stress   -Sleep better and help you feel better overall  To lower your risk of heart disease, limit your intake of saturated fat and trans fat as much as possible. THE GOOD WHAT IT DOES WHERE IT'S FOUND  MONOUNSATURATED FAT Lowers LDL and maybe raises HDL cholesterol Canola oil, olives, olive oil, peanuts, peanut oil, avocados, nuts  POLYUNSATURATED FAT Lowers LDL cholesterol Corn, safflower, sunflower and soybean oils, nuts, seeds  OMEGA-3 FATTY ACIDS Lowers triglycerides (blood fats) and blood pressure Salmon, mackerel, herring, sardines, flax seed, flaxseed oil, walnuts, soybean oil  THE BAD WHAT IT DOES WHERE  IT'S FOUND  SATURATED FAT Raises LDL (bad) cholesterol Butter, shortening, lard, red meat, cheese, whole milk, ice cream, coconut and palm oils  TRANS FAT Raises LDL cholesterol, lowers HDL (good) cholesterol Fried foods, some stick margarines, some cookies and crackers (look for hydrogenated fat on the ingredient list)  CHOLESTEROL FROM FOOD Too much may raise cholesterol levels Meat, poultry, seafood, eggs, milk, cheese, yogurt, butter   Plate Method (How much food of each food group) 1) Fill one half of your plate with nonstarchy vegetables: lettuce, broccoli, green beans, spinach, carrots or peppers. 2) Fill one quarter with protein: chicken, [place], fish, lean meat, eggs or tofu. 3) Fill one quarter with a nutritious carbohydrate food: brown rice, whole-wheat pasta, whole-wheat bread, peas, or corn.  Choose whole-wheat carbs for extra nutrition.  Controlling carbs helps you control your blood glucose. 4) Include a small piece of fruit at each meal, as well as 8 ounces of lowfat milk or yogurt. 5) Add 1-2 teaspoons of heart-healthy fat, such as olive or canola oil, trans fat-free margarine, avocado, nuts or seeds.

## 2013-12-06 NOTE — Progress Notes (Addendum)
Assessment and Plan:  1. Essential hypertension Continue medication (Microzide, Metoprolol Succinate) as prescribed, monitor blood pressure at home. Reminder to go to the ER if any CP, SOB, nausea, dizziness, severe HA, changes vision/speech, left arm numbness and tingling, and jaw pain. - CBC with Differential - BASIC METABOLIC PANEL WITH GFR - Hepatic function panel - metoprolol succinate (TOPROL-XL) 50 MG 24 hr tablet; TAKE 1 TABLET BY MOUTH EVERY DAY *MAX 30 DAYS ON INSURANCE*  Dispense: 30 tablet; Refill: 2  2. Paroxysmal atrial fibrillation  RRR on exam, no symptoms, controlled.  Will monitor.    3. Hyperlipidemia Continue Fish Oil as prescribed.  Continue diet and start exercising 120 minutes per week. Check cholesterol. - Lipid panel  4. Vitamin D deficiency Continue Vitamin D 50,000 IU daily except weekends.  Check level and continue medications.  - Vit D  25 hydroxy   5. Prediabetes Continue diet and start exercising 120 minutes per week. Check A1C - Hemoglobin A1c  6. Encounter for long-term (current) use of medications Will monitor kidney and liver function. - CBC with Differential - BASIC METABOLIC PANEL WITH GFR - Hepatic function panel  7. Anxiety and Depression Continue Xanax, Wellbutrin and Celexa as prescribed- ALPRAZolam (XANAX) 1 MG tablet; Take 1/2 to 1 tablet 3 times a day as needed.  Dispense: 90 tablet; Refill: 1  8. Muscle spasm -Continue Flexeril as prescribed- cyclobenzaprine (FLEXERIL) 10 MG tablet; TAKE 1/2 TO 1 TABLET BY MOUTH 3 TIMES A DAY AS NEEDED AS NEEDED FOR MUSCLE SPASM  Dispense: 90 tablet; Refill: 3  9. Rash Recheck lyme blood test.  Most likely dermatitis or eczema.   - Lyme Juliette Alcide. Blt. IgG & IgM w/bands  Discussed medication effects and SE's.  Pt agreed to treatment plan. Continue diet and meds as discussed. Further disposition pending results of labs. Please keep your physical appt on 03/13/14 with Dr. Melford Aase.  HPI 63 y.o.  female  presents for 3 month follow up with hypertension, hyperlipidemia, prediabetes, paroxysmal AFib and vitamin D.  Her blood pressure has been controlled at home, today their BP is BP: 122/80 mmHg She does not workout. She denies chest pain, shortness of breath, dizziness.   She is not on cholesterol medication and denies myalgias. Her cholesterol is at goal. The cholesterol last visit was:   Lab Results  Component Value Date   CHOL 158 09/09/2013   HDL 72 09/09/2013   LDLCALC 73 09/09/2013   TRIG 63 09/09/2013   CHOLHDL 2.2 09/09/2013   She has not been working on diet and exercise for prediabetes, and denies increased appetite, polydipsia and polyuria. Last A1C in the office was:  Lab Results  Component Value Date   HGBA1C 5.8* 09/09/2013   Currently manages prediabetes with?  Diet  Number of meals per day?   B- oatmeal  L- salad   D- salad, pizza lately, fish once a week   Snacks? V8 juice  Beverages?  Water with lemonade or limeade packets mixed in, decaf tea at home  Patient is on Vitamin D supplement- 50,000 IU daily- does not take on weekends Lab Results  Component Value Date   VD25OH 109* 09/09/2013     Current Medications:  Current Outpatient Prescriptions on File Prior to Visit  Medication Sig Dispense Refill  . ALPRAZolam (XANAX) 1 MG tablet TAKE 1/2 TO 1 TABLET 3 TIMES A DAY AS NEEDED 90 tablet 1  . Ascorbic Acid (VITAMIN C) 1000 MG tablet Take 1,000 mg by mouth  daily.    . aspirin 81 MG tablet Take 81 mg by mouth daily.    Marland Kitchen buPROPion (WELLBUTRIN XL) 300 MG 24 hr tablet TAKE 1 TABLET BY MOUTH EVERY DAY 30 tablet 3  . citalopram (CELEXA) 20 MG tablet Take 20 mg by mouth daily.    . cyclobenzaprine (FLEXERIL) 10 MG tablet TAKE 1/2 TO 1 TABLET BY MOUTH 3 TIMES A DAY AS NEEDED AS NEEDED FOR MUSCLE SPASM 90 tablet 3  . Flaxseed, Linseed, (FLAX SEED OIL PO) Take by mouth.    . hydrochlorothiazide (MICROZIDE) 12.5 MG capsule Take 1 capsule (12.5 mg total) by mouth  daily. 90 capsule 0  . hydrOXYzine (ATARAX/VISTARIL) 25 MG tablet Take 1-2 tabs QID PRN for itching. 100 tablet 0  . Magnesium 250 MG TABS Take 250 mg by mouth 2 (two) times daily.     . metoprolol succinate (TOPROL-XL) 50 MG 24 hr tablet TAKE 1 TABLET BY MOUTH EVERY DAY *MAX 30 DAYS ON INSURANCE* 90 tablet 1  . Multiple Vitamin (MULTIVITAMIN) tablet Take 1 tablet by mouth daily.    . Omega-3 Fatty Acids (FISH OIL PO) Take by mouth.    . Vitamin D, Ergocalciferol, (DRISDOL) 50000 UNITS CAPS capsule Take 1 capsule daily or as directed for severe Vitamin D Deficiency 30 capsule 99   No current facility-administered medications on file prior to visit.   Medical History:  Past Medical History  Diagnosis Date  . Hyperlipidemia   . Hypertension   . Pre-diabetes   . Vitamin D deficiency   . Arthritis   . A-fib   . Personal history of colonic polyp- adenoma 09/22/2013   Allergies: No Known Allergies   Review of Systems: [X]  = complains of  [ ]  = denies General: Fatigue Valu.Nieves ] Fever [ ]  Chills [ ]  Weakness [ ]   Insomnia [ ]  Eyes: Redness [ ]  Blurred vision [ ]  Diplopia [ ]   ENT: Congestion [ ]  Sinus Pain [ ]  Post Nasal Drip [ ]  Sore Throat [ ]  Earache [ ]   Cardiac: Chest pain/pressure [ ]  SOB [ ]  Orthopnea [ ]   Palpitations [ ]   Paroxysmal nocturnal dyspnea[ ]  Claudication [ ]  Edema [ ]   Pulmonary: Cough [ ]  Wheezing[ ]   SOB [ ]   Snoring [ ]   GI: Nausea [ ]  Vomiting[ ]  Dysphagia[ ]  Heartburn[ ]  Abdominal pain [ ]  Constipation [ ] ; Diarrhea [ ] ; BRBPR [ ]  Melena[ ]  GU: Hematuria[ ]  Dysuria [ ]  Nocturia[ ]  Urgency [ ]   Hesitancy [ ]  Discharge [ ]  Neuro: Headaches[ ]  Vertigo[ ]  Paresthesias[ ]  Spasm [ ]  Speech changes [ ]  Incoordination [ ]   Ortho: Arthritis [ ]  Joint pain [ ]  Muscle pain [ ]  Joint swelling [ ]  Back Pain [ ]  Skin:  Rash [ ]   Pruritis [ ]  Change in skin lesion [ ]   Psych: Depression[X ] Anxiety[ X] Confusion [ ]  Memory loss [ ]   Heme/Lypmh: Bleeding [ ]  Bruising [ ]  Enlarged lymph  nodes [ ]   Endocrine: Visual blurring [ ]  Paresthesia [ ]  Polyuria [ ]  Polydypsea [ ]    Heat/cold intolerance [ ]  Hypoglycemia [ ]   Family history- Review and unchanged Social history- Review and unchanged Physical Exam: BP 122/80 mmHg  Pulse 66  Temp(Src) 98.2 F (36.8 C) (Temporal)  Resp 18  Ht 5\' 9"  (1.753 m)  Wt 187 lb (84.823 kg)  BMI 27.60 kg/m2 Wt Readings from Last 3 Encounters:  12/06/13 187 lb (84.823 kg)  09/16/13  185 lb (83.915 kg)  09/09/13 185 lb (83.915 kg)  Vitals Reviewed. General Appearance: Well nourished, in no apparent distress. Eyes: PERRLA, EOMs, conjunctiva no swelling or erythema or icterus.   Sinuses: No Frontal/maxillary tenderness ENT/Mouth: Ext aud canals clear, TMs without erythema, bulging. No erythema, swelling, or exudate on post pharynx.  Tonsils not swollen or erythematous. Hearing normal.  Neck: Supple, thyroid normal.  Respiratory: Respiratory effort normal, CTAB.  No w/r/r or stridor.  Cardio: RRR with no MRGs. Brisk peripheral pulses without edema.  Abdomen: Soft, + normal BS.  Non tender, no guarding, rebound, hernias, masses. Lymphatics: Non tender without lymphadenopathy.  Musculoskeletal: Full ROM, 5/5 strength, normal gait.  Skin: Warm, dry and with healed rashes on left palmar wrist and left lateral ankle (hyperpigmented maculopapular rash that the papules looked healed over/scabbed.  Rash was pruritic and now is not).  No lesions or ecchymosis.  Neuro: Cranial nerves intact. Normal muscle tone, no cerebellar symptoms. Sensation intact.  Psych: Awake and oriented X 3, normal affect, Insight and Judgment appropriate.    Noble Bodie, Stephani Police, PA-C 11:19 AM [facility]

## 2013-12-07 LAB — LYME ABY, WSTRN BLT IGG & IGM W/BANDS
B BURGDORFERI IGG ABS (IB): NEGATIVE
B BURGDORFERI IGM ABS (IB): NEGATIVE
LYME DISEASE 18 KD IGG: NONREACTIVE
LYME DISEASE 23 KD IGG: NONREACTIVE
LYME DISEASE 23 KD IGM: NONREACTIVE
LYME DISEASE 39 KD IGM: NONREACTIVE
LYME DISEASE 58 KD IGG: NONREACTIVE
LYME DISEASE 66 KD IGG: NONREACTIVE
LYME DISEASE 93 KD IGG: NONREACTIVE
Lyme Disease 28 kD IgG: NONREACTIVE
Lyme Disease 30 kD IgG: NONREACTIVE
Lyme Disease 39 kD IgG: NONREACTIVE
Lyme Disease 41 kD IgG: NONREACTIVE
Lyme Disease 41 kD IgM: NONREACTIVE
Lyme Disease 45 kD IgG: NONREACTIVE

## 2013-12-07 LAB — VITAMIN D 25 HYDROXY (VIT D DEFICIENCY, FRACTURES): VIT D 25 HYDROXY: 96 ng/mL (ref 30–100)

## 2013-12-10 ENCOUNTER — Encounter: Payer: Self-pay | Admitting: *Deleted

## 2013-12-20 ENCOUNTER — Encounter: Payer: Self-pay | Admitting: Internal Medicine

## 2014-01-11 ENCOUNTER — Ambulatory Visit: Payer: Self-pay | Admitting: Internal Medicine

## 2014-01-14 ENCOUNTER — Other Ambulatory Visit: Payer: Self-pay | Admitting: Internal Medicine

## 2014-01-14 MED ORDER — VITAMIN D (ERGOCALCIFEROL) 1.25 MG (50000 UNIT) PO CAPS: ORAL_CAPSULE | ORAL | Status: DC

## 2014-01-16 ENCOUNTER — Other Ambulatory Visit: Payer: Self-pay | Admitting: Internal Medicine

## 2014-01-17 ENCOUNTER — Encounter: Payer: Self-pay | Admitting: Internal Medicine

## 2014-03-13 ENCOUNTER — Ambulatory Visit (INDEPENDENT_AMBULATORY_CARE_PROVIDER_SITE_OTHER): Payer: Commercial Managed Care - PPO | Admitting: Internal Medicine

## 2014-03-13 ENCOUNTER — Encounter: Payer: Self-pay | Admitting: Internal Medicine

## 2014-03-13 VITALS — BP 134/82 | HR 64 | Temp 98.0°F | Resp 18 | Ht 68.75 in | Wt 191.0 lb

## 2014-03-13 DIAGNOSIS — F329 Major depressive disorder, single episode, unspecified: Secondary | ICD-10-CM

## 2014-03-13 DIAGNOSIS — I482 Chronic atrial fibrillation, unspecified: Secondary | ICD-10-CM

## 2014-03-13 DIAGNOSIS — Z79899 Other long term (current) drug therapy: Secondary | ICD-10-CM

## 2014-03-13 DIAGNOSIS — Z1212 Encounter for screening for malignant neoplasm of rectum: Secondary | ICD-10-CM

## 2014-03-13 DIAGNOSIS — R7303 Prediabetes: Secondary | ICD-10-CM

## 2014-03-13 DIAGNOSIS — F32A Depression, unspecified: Secondary | ICD-10-CM

## 2014-03-13 DIAGNOSIS — E559 Vitamin D deficiency, unspecified: Secondary | ICD-10-CM

## 2014-03-13 DIAGNOSIS — E782 Mixed hyperlipidemia: Secondary | ICD-10-CM

## 2014-03-13 DIAGNOSIS — I10 Essential (primary) hypertension: Secondary | ICD-10-CM

## 2014-03-13 DIAGNOSIS — R7309 Other abnormal glucose: Secondary | ICD-10-CM

## 2014-03-13 DIAGNOSIS — R5383 Other fatigue: Secondary | ICD-10-CM

## 2014-03-13 NOTE — Progress Notes (Signed)
Patient ID: Taylor Li, female   DOB: 1950-02-13, 64 y.o.   MRN: [id number]  Annual Comprehensive Examination  This very nice 64 y.o. reMWF presents for complete physical.  Patient has been followed for HTN, hx/ pAfib,Prediabetes, Hyperlipidemia, and Vitamin D Deficiency.    Patient has hx/o mild volume dependent HTN predates since 2004 controlled well on low dose diuretic Rx. . Patient's BP has been controlled at home and patient denies any cardiac symptoms as chest pain, palpitations, shortness of breath, dizziness or ankle swelling. Today's BP was  134/82 mmHg    Patient's hyperlipidemia is controlled with diet and medications. Patient denies myalgias or other medication SE's. Last lipids were at goal - Total Chol 178; HDL-C 76; LDL 87; Trig 74 on 12/06/2013.   Patient has  prediabetes predating since 2011 with an A1c of 5.8%  and patient denies reactive hypoglycemic symptoms, visual blurring, diabetic polys, or paresthesias. Last A1c was 5.6% on 12/06/2013.   Finally, patient has history of Vitamin D Deficiency of 73 in July 2010 and last Vitamin D was  96 on 12/06/2013.  Medication Sig  . ALPRAZolam  1 MG tablet Take 1/2 to 1 tab 3 times a day as needed.  Marland Kitchen VITAMIN C 1000 MG tablet Take 1,000 mg daily.  Marland Kitchen aspirin 81 MG tablet Take 81 mg  daily.  Marland Kitchen buPROPion  XL) 300 MG 24 hr tablet TAKE 1 TAB EVERY DAY  . citalopram  20 MG tablet Take 20 mg by mouth daily.  . cyclobenzaprine 10 MG tablet TAKE 1/2 TO 1 TAB 3 TIMES A DAY AS NEEDED   . Flaxseed   . FLUARIX QUADRIVALENT 0.5 ML inj   . hydrochlorothiazide 12.5 MG cap Take 1 capsule (12.5 mg total)daily.  . hydrOXYzine (ATARAX/VISTARIL) 25 MG tablet Take 1-2 tabs QID PRN for itching.  . Magnesium 250 MG TABS Take  2 (two) times daily.   . metoprolol succinate -XL)50 MG  TAKE 1 TABLET BY MOUTH EVERY DAY  . MULTIVITAMIN Take 1 tablet daily.  Marland Kitchen FISH OIL   . Vitamin D / DRISDOL 50,000 UNITS Take 1 capsule daily or as directed for severe  Vitamin D Deficiency  . XARTEMIS XR 7.5-325 MG TBCR    No Known Allergies   Past Medical History  Diagnosis Date  . Hyperlipidemia   . Hypertension   . Pre-diabetes   . Vitamin D deficiency   . Arthritis   . A-fib   . Personal history of colonic polyp- adenoma 09/22/2013   Health Maintenance  Topic Date Due  . PAP SMEAR  04/25/1970  . TETANUS/TDAP  04/25/1971  . INFLUENZA VACCINE  08/14/2014  . MAMMOGRAM  04/16/2015  . COLONOSCOPY  09/17/2018  . ZOSTAVAX  Completed  . HIV Screening  Completed   Immunization History  Administered Date(s) Administered  . DTaP 01/13/2001  . Influenza Whole 10/06/2012  . Influenza-Unspecified 11/11/2013  . PPD Test 01/14/2013  . Zoster 11/19/2009   Past Surgical History  Procedure Laterality Date  . Laminectomy  1982    L4-L5  . Breast surgery Bilateral 1983    SQ mastectomies  . Vaginal hysterectomy  1985  . Tonsillectomy    . Nasal septum surgery    . Cardioversion    . Colonoscopy     Family History  Problem Relation Age of Onset  . Hypertension Mother   . Barrett's esophagus Mother   . Hypertension Father   . Cancer Father     lung  .  Thyroid disease Father   . Colon cancer Neg Hx    History  Substance Use Topics  . Smoking status: Former Smoker    Quit date: 04/18/2012  . Smokeless tobacco: Current User     Comment: E-Cigarretts  . Alcohol Use: 1.8 oz/week    3 Standard drinks or equivalent per week     Comment: social    ROS Constitutional: Denies fever, chills, weight loss/gain, headaches, insomnia, fatigue, night sweats, and change in appetite. Eyes: Denies redness, blurred vision, diplopia, discharge, itchy, watery eyes.  ENT: Denies discharge, congestion, post nasal drip, epistaxis, sore throat, earache, hearing loss, dental pain, Tinnitus, Vertigo, Sinus pain, snoring.  Cardio: Denies chest pain, palpitations, irregular heartbeat, syncope, dyspnea, diaphoresis, orthopnea, PND, claudication,  edema Respiratory: denies cough, dyspnea, DOE, pleurisy, hoarseness, laryngitis, wheezing.  Gastrointestinal: Denies dysphagia, heartburn, reflux, water brash, pain, cramps, nausea, vomiting, bloating, diarrhea, constipation, hematemesis, melena, hematochezia, jaundice, hemorrhoids Genitourinary: Denies dysuria, frequency, urgency, nocturia, hesitancy, discharge, hematuria, flank pain Breast: Breast lumps, nipple discharge, bleeding.  Musculoskeletal: Denies arthralgia, myalgia, stiffness, Jt. Swelling, pain, limp, and strain/sprain. Denies falls. Skin: Denies puritis, rash, hives, warts, acne, eczema, changing in skin lesion Neuro: No weakness, tremor, incoordination, spasms, paresthesia, pain Psychiatric: Denies confusion, memory loss, sensory loss. Denies Depression. Endocrine: Denies change in weight, skin, hair change, nocturia, and paresthesia, diabetic polys, visual blurring, hyper / hypo glycemic episodes.  Heme/Lymph: No excessive bleeding, bruising, enlarged lymph nodes.  Physical Exam  BP 134/82   Pulse 64  Temp 98 F   Resp 18  Ht 5' 8.75"   Wt 191 lb     BMI 28.42   General Appearance: Well nourished and in no apparent distress. Eyes: PERRLA, EOMs, conjunctiva no swelling or erythema, normal fundi and vessels. Sinuses: No frontal/maxillary tenderness ENT/Mouth: EACs patent / TMs  nl. Nares clear without erythema, swelling, mucoid exudates. Oral hygiene is good. No erythema, swelling, or exudate. Tongue normal, non-obstructing. Tonsils not swollen or erythematous. Hearing normal.  Neck: Supple, thyroid normal. No bruits, nodes or JVD. Respiratory: Respiratory effort normal.  BS equal and clear bilateral without rales, rhonci, wheezing or stridor. Cardio: Heart sounds are normal with regular rate and rhythm and no murmurs, rubs or gallops. Peripheral pulses are normal and equal bilaterally without edema. No aortic or femoral bruits. Chest: symmetric with normal excursions and  percussion. Breasts: Symmetric, without lumps, nipple discharge, retractions, or fibrocystic changes.  Abdomen: Flat, soft, with bowl sounds. Nontender, no guarding, rebound, hernias, masses, or organomegaly.  Lymphatics: Non tender without lymphadenopathy.  Genitourinary:  Musculoskeletal: Full ROM all peripheral extremities, joint stability, 5/5 strength, and normal gait. Skin: Warm and dry without rashes, lesions, cyanosis, clubbing or  ecchymosis.  Neuro: Cranial nerves intact, reflexes equal bilaterally. Normal muscle tone, no cerebellar symptoms. Sensation intact.  Pysch: Awake and oriented X 3, normal affect, Insight and Judgment appropriate.   Assessment and Plan  1. Essential hypertension  - Microalbumin / creatinine urine ratio - EKG 12-Lead - [place], RETROPERITNL ABD,  LTD  2. Hyperlipidemia  - Lipid panel  3. Prediabetes  - Hemoglobin A1c - Insulin, fasting  4. Vitamin D deficiency  - Vit D  25 hydroxy (rtn osteoporosis monitoring)  5. Chronic atrial fibrillation   6. Screening for rectal cancer  - POC Hemoccult Bld/Stl (3-Cd Home Screen); Future  7. Depression, controlled   8. Other fatigue  - Vitamin B12 - Iron and TIBC - TSH  9. Encounter for long-term (current) use of medications  -  Urine Microscopic - CBC with Differential/Platelet - BASIC METABOLIC PANEL WITH GFR - Hepatic function panel - Magnesium   Continue prudent diet as discussed, weight control, BP monitoring, regular exercise, and medications. Discussed med's effects and SE's. Screening labs and tests as requested with regular follow-up as recommended.

## 2014-03-13 NOTE — Patient Instructions (Signed)
Recommend the book "The END of DIETING" by Dr Excell Seltzer   & the book "The END of DIABETES " by Dr Excell Seltzer  At [email or web address] - get book & Audio CD's      Being diabetic has a  300% increased risk for heart attack, stroke, cancer, and alzheimer- type vascular dementia. It is very important that you work harder with diet by avoiding all foods that are white. Avoid white rice (brown & wild rice is OK), white potatoes (sweetpotatoes in moderation is OK), White bread or wheat bread or anything made out of white flour like bagels, donuts, rolls, buns, biscuits, cakes, pastries, cookies, pizza crust, and pasta (made from white flour & egg whites) - vegetarian pasta or spinach or wheat pasta is OK. Multigrain breads like Arnold's or [employer], or multigrain sandwich thins or flatbreads.  Diet, exercise and weight loss can reverse and cure diabetes in the early stages.  Diet, exercise and weight loss is very important in the control and prevention of complications of diabetes which affects every system in your body, ie. Brain - dementia/stroke, eyes - glaucoma/blindness, heart - heart attack/heart failure, kidneys - dialysis, stomach - gastric paralysis, intestines - malabsorption, nerves - severe painful neuritis, circulation - gangrene & loss of a leg(s), and finally cancer and Alzheimers.    I recommend avoid fried & greasy foods,  sweets/candy, white rice (brown or wild rice or Quinoa is OK), white potatoes (sweet potatoes are OK) - anything made from white flour - bagels, doughnuts, rolls, buns, biscuits,white and wheat breads, pizza crust and traditional pasta made of white flour & egg white(vegetarian pasta or spinach or wheat pasta is OK).  Multi-grain bread is OK - like multi-grain flat bread or sandwich thins. Avoid alcohol in excess. Exercise is also important.    Eat all the vegetables you want - avoid meat, especially red meat and dairy - especially cheese.  Cheese is the most  concentrated form of trans-fats which is the worst thing to clog up our arteries. Veggie cheese is OK which can be found in the fresh produce section at [employer] or [employer] or [employer]  Preventive Care for Adults A healthy lifestyle and preventive care can promote health and wellness. Preventive health guidelines for women include the following key practices.  A routine yearly physical is a good way to check with your health care provider about your health and preventive screening. It is a chance to share any concerns and updates on your health and to receive a thorough exam.  Visit your dentist for a routine exam and preventive care every 6 months. Brush your teeth twice a day and floss once a day. Good oral hygiene prevents tooth decay and gum disease.  The frequency of eye exams is based on your age, health, family medical history, use of contact lenses, and other factors. Follow your health care provider's recommendations for frequency of eye exams.  Eat a healthy diet. Foods like vegetables, fruits, whole grains, low-fat dairy products, and lean protein foods contain the nutrients you need without too many calories. Decrease your intake of foods high in solid fats, added sugars, and salt. Eat the right amount of calories for you.Get information about a proper diet from your health care provider, if necessary.  Regular physical exercise is one of the most important things you can do for your health. Most adults should get at least 150 minutes of moderate-intensity exercise (any activity that increases your heart rate and  causes you to sweat) each week. In addition, most adults need muscle-strengthening exercises on 2 or more days a week.  Maintain a healthy weight. The body mass index (BMI) is a screening tool to identify possible weight problems. It provides an estimate of body fat based on height and weight. Your health care provider can find your BMI and can help you achieve or  maintain a healthy weight.For adults 20 years and older:  A BMI below 18.5 is considered underweight.  A BMI of 18.5 to 24.9 is normal.  A BMI of 25 to 29.9 is considered overweight.  A BMI of 30 and above is considered obese.  Maintain normal blood lipids and cholesterol levels by exercising and minimizing your intake of saturated fat. Eat a balanced diet with plenty of fruit and vegetables. Blood tests for lipids and cholesterol should begin at age 57 and be repeated every 5 years. If your lipid or cholesterol levels are high, you are over 50, or you are at high risk for heart disease, you may need your cholesterol levels checked more frequently.Ongoing high lipid and cholesterol levels should be treated with medicines if diet and exercise are not working.  If you smoke, find out from your health care provider how to quit. If you do not use tobacco, do not start.  Lung cancer screening is recommended for adults aged 52-80 years who are at high risk for developing lung cancer because of a history of smoking. A yearly low-dose CT scan of the lungs is recommended for people who have at least a 30-pack-year history of smoking and are a current smoker or have quit within the past 15 years. A pack year of smoking is smoking an average of 1 pack of cigarettes a day for 1 year (for example: 1 pack a day for 30 years or 2 packs a day for 15 years). Yearly screening should continue until the smoker has stopped smoking for at least 15 years. Yearly screening should be stopped for people who develop a health problem that would prevent them from having lung cancer treatment.  High blood pressure causes heart disease and increases the risk of stroke. Your blood pressure should be checked at least every 1 to 2 years. Ongoing high blood pressure should be treated with medicines if weight loss and exercise do not work.  If you are 43-25 years old, ask your health care provider if you should take aspirin to  prevent strokes.  Diabetes screening involves taking a blood sample to check your fasting blood sugar level. This should be done once every 3 years, after age 36, if you are within normal weight and without risk factors for diabetes. Testing should be considered at a younger age or be carried out more frequently if you are overweight and have at least 1 risk factor for diabetes.  Breast cancer screening is essential preventive care for women. You should practice "breast self-awareness." This means understanding the normal appearance and feel of your breasts and may include breast self-examination. Any changes detected, no matter how small, should be reported to a health care provider. Women in their 73s and 30s should have a clinical breast exam (CBE) by a health care provider as part of a regular health exam every 1 to 3 years. After age 12, women should have a CBE every year. Starting at age 75, women should consider having a mammogram (breast X-ray test) every year. Women who have a family history of breast cancer should talk to  their health care provider about genetic screening. Women at a high risk of breast cancer should talk to their health care providers about having an MRI and a mammogram every year.  Breast cancer gene (BRCA)-related cancer risk assessment is recommended for women who have family members with BRCA-related cancers. BRCA-related cancers include breast, ovarian, tubal, and peritoneal cancers. Having family members with these cancers may be associated with an increased risk for harmful changes (mutations) in the breast cancer genes BRCA1 and BRCA2. Results of the assessment will determine the need for genetic counseling and BRCA1 and BRCA2 testing.  Routine pelvic exams to screen for cancer are no longer recommended for nonpregnant women who are considered low risk for cancer of the pelvic organs (ovaries, uterus, and vagina) and who do not have symptoms. Ask your health care provider  if a screening pelvic exam is right for you.  If you have had past treatment for cervical cancer or a condition that could lead to cancer, you need Pap tests and screening for cancer for at least 20 years after your treatment. If Pap tests have been discontinued, your risk factors (such as having a new sexual partner) need to be reassessed to determine if screening should be resumed. Some women have medical problems that increase the chance of getting cervical cancer. In these cases, your health care provider may recommend more frequent screening and Pap tests.  Colorectal cancer can be detected and often prevented. Most routine colorectal cancer screening begins at the age of 63 years and continues through age 41 years. However, your health care provider may recommend screening at an earlier age if you have risk factors for colon cancer. On a yearly basis, your health care provider may provide home test kits to check for hidden blood in the stool. Use of a small camera at the end of a tube, to directly examine the colon (sigmoidoscopy or colonoscopy), can detect the earliest forms of colorectal cancer. Talk to your health care provider about this at age 57, when routine screening begins. Direct exam of the colon should be repeated every 5-10 years through age 65 years, unless early forms of pre-cancerous polyps or small growths are found.  Hepatitis C blood testing is recommended for all people born from [birth year] through 1965 and any individual with known risks for hepatitis C.  Pra  Osteoporosis is a disease in which the bones lose minerals and strength with aging. This can result in serious bone fractures or breaks. The risk of osteoporosis can be identified using a bone density scan. Women ages 78 years and over and women at risk for fractures or osteoporosis should discuss screening with their health care providers. Ask your health care provider whether you should take a calcium supplement or vitamin D  to reduce the rate of osteoporosis.  Menopause can be associated with physical symptoms and risks. Hormone replacement therapy is available to decrease symptoms and risks. You should talk to your health care provider about whether hormone replacement therapy is right for you.  Use sunscreen. Apply sunscreen liberally and repeatedly throughout the day. You should seek shade when your shadow is shorter than you. Protect yourself by wearing long sleeves, pants, a wide-brimmed hat, and sunglasses year round, whenever you are outdoors.  Once a month, do a whole body skin exam, using a mirror to look at the skin on your back. Tell your health care provider of new moles, moles that have irregular borders, moles that are larger than a pencil  eraser, or moles that have changed in shape or color.  Stay current with required vaccines (immunizations).  Influenza vaccine. All adults should be immunized every year.  Tetanus, diphtheria, and acellular pertussis (Td, Tdap) vaccine. Pregnant women should receive 1 dose of Tdap vaccine during each pregnancy. The dose should be obtained regardless of the length of time since the last dose. Immunization is preferred during the 27th-36th week of gestation. An adult who has not previously received Tdap or who does not know her vaccine status should receive 1 dose of Tdap. This initial dose should be followed by tetanus and diphtheria toxoids (Td) booster doses every 10 years. Adults with an unknown or incomplete history of completing a 3-dose immunization series with Td-containing vaccines should begin or complete a primary immunization series including a Tdap dose. Adults should receive a Td booster every 10 years.  Varicella vaccine. An adult without evidence of immunity to varicella should receive 2 doses or a second dose if she has previously received 1 dose. Pregnant females who do not have evidence of immunity should receive the first dose after pregnancy. This first  dose should be obtained before leaving the health care facility. The second dose should be obtained 4-8 weeks after the first dose.  Human papillomavirus (HPV) vaccine. Females aged 13-26 years who have not received the vaccine previously should obtain the 3-dose series. The vaccine is not recommended for use in pregnant females. However, pregnancy testing is not needed before receiving a dose. If a female is found to be pregnant after receiving a dose, no treatment is needed. In that case, the remaining doses should be delayed until after the pregnancy. Immunization is recommended for any person with an immunocompromised condition through the age of 24 years if she did not get any or all doses earlier. During the 3-dose series, the second dose should be obtained 4-8 weeks after the first dose. The third dose should be obtained 24 weeks after the first dose and 16 weeks after the second dose.  Zoster vaccine. One dose is recommended for adults aged 52 years or older unless certain conditions are present.  Measles, mumps, and rubella (MMR) vaccine. Adults born before [birth year] generally are considered immune to measles and mumps. Adults born in [birth year] or later should have 1 or more doses of MMR vaccine unless there is a contraindication to the vaccine or there is laboratory evidence of immunity to each of the three diseases. A routine second dose of MMR vaccine should be obtained at least 28 days after the first dose for students attending postsecondary schools, health care workers, or international travelers. People who received inactivated measles vaccine or an unknown type of measles vaccine during 1963-1967 should receive 2 doses of MMR vaccine. People who received inactivated mumps vaccine or an unknown type of mumps vaccine before 1979 and are at high risk for mumps infection should consider immunization with 2 doses of MMR vaccine. For females of childbearing age, rubella immunity should be determined. If there  is no evidence of immunity, females who are not pregnant should be vaccinated. If there is no evidence of immunity, females who are pregnant should delay immunization until after pregnancy. Unvaccinated health care workers born before [birth year] who lack laboratory evidence of measles, mumps, or rubella immunity or laboratory confirmation of disease should consider measles and mumps immunization with 2 doses of MMR vaccine or rubella immunization with 1 dose of MMR vaccine.  Pneumococcal 13-valent conjugate (PCV13) vaccine. When indicated, a person who  is uncertain of her immunization history and has no record of immunization should receive the PCV13 vaccine. An adult aged 68 years or older who has certain medical conditions and has not been previously immunized should receive 1 dose of PCV13 vaccine. This PCV13 should be followed with a dose of pneumococcal polysaccharide (PPSV23) vaccine. The PPSV23 vaccine dose should be obtained at least 8 weeks after the dose of PCV13 vaccine. An adult aged 60 years or older who has certain medical conditions and previously received 1 or more doses of PPSV23 vaccine should receive 1 dose of PCV13. The PCV13 vaccine dose should be obtained 1 or more years after the last PPSV23 vaccine dose.    Pneumococcal polysaccharide (PPSV23) vaccine. When PCV13 is also indicated, PCV13 should be obtained first. All adults aged 42 years and older should be immunized. An adult younger than age 80 years who has certain medical conditions should be immunized. Any person who resides in a nursing home or long-term care facility should be immunized. An adult smoker should be immunized. People with an immunocompromised condition and certain other conditions should receive both PCV13 and PPSV23 vaccines. People with human immunodeficiency virus (HIV) infection should be immunized as soon as possible after diagnosis. Immunization during chemotherapy or radiation therapy should be avoided. Routine use  of PPSV23 vaccine is not recommended for American Indians, [place] Natives, or people younger than 65 years unless there are medical conditions that require PPSV23 vaccine. When indicated, people who have unknown immunization and have no record of immunization should receive PPSV23 vaccine. One-time revaccination 5 years after the first dose of PPSV23 is recommended for people aged 19-64 years who have chronic kidney failure, nephrotic syndrome, asplenia, or immunocompromised conditions. People who received 1-2 doses of PPSV23 before age 70 years should receive another dose of PPSV23 vaccine at age 68 years or later if at least 5 years have passed since the previous dose. Doses of PPSV23 are not needed for people immunized with PPSV23 at or after age 38 years.  Preventive Services / Frequency   Ages 9 to 79 years  Blood pressure check.  Lipid and cholesterol check.  Lung cancer screening. / Every year if you are aged 51-80 years and have a 30-pack-year history of smoking and currently smoke or have quit within the past 15 years. Yearly screening is stopped once you have quit smoking for at least 15 years or develop a health problem that would prevent you from having lung cancer treatment.  Clinical breast exam.** / Every year after age 32 years.  BRCA-related cancer risk assessment.** / For women who have family members with a BRCA-related cancer (breast, ovarian, tubal, or peritoneal cancers).  Mammogram.** / Every year beginning at age 21 years and continuing for as long as you are in good health. Consult with your health care provider.  Pap test.** / Every 3 years starting at age 56 years through age 72 or 3 years with a history of 3 consecutive normal Pap tests.  HPV screening.** / Every 3 years from ages 39 years through ages 22 to 61 years with a history of 3 consecutive normal Pap tests.  Fecal occult blood test (FOBT) of stool. / Every year beginning at age 18 years and continuing  until age 70 years. You may not need to do this test if you get a colonoscopy every 10 years.  Flexible sigmoidoscopy or colonoscopy.** / Every 5 years for a flexible sigmoidoscopy or every 10 years for a colonoscopy beginning  at age 50 years and continuing until age 42 years.  Hepatitis C blood test.** / For all people born from [birth year] through 1963 and any individual with known risks for hepatitis C.  Skin self-exam. / Monthly.  Influenza vaccine. / Every year.  Tetanus, diphtheria, and acellular pertussis (Tdap/Td) vaccine.** / Consult your health care provider. Pregnant women should receive 1 dose of Tdap vaccine during each pregnancy. 1 dose of Td every 10 years.  Varicella vaccine.** / Consult your health care provider. Pregnant females who do not have evidence of immunity should receive the first dose after pregnancy.  Zoster vaccine.** / 1 dose for adults aged 27 years or older.  Pneumococcal 13-valent conjugate (PCV13) vaccine.** / Consult your health care provider.  Pneumococcal polysaccharide (PPSV23) vaccine.** / 1 to 2 doses if you smoke cigarettes or if you have certain conditions.  Meningococcal vaccine.** / Consult your health care provider.  Hepatitis A vaccine.** / Consult your health care provider.  Hepatitis B vaccine.** / Consult your health care provider. Screening for abdominal aortic aneurysm (AAA)  by ultrasound is recommended for people over 50 who have history of high blood pressure or who are current or former smokers.

## 2014-03-14 LAB — BASIC METABOLIC PANEL WITH GFR
BUN: 16 mg/dL (ref 6–23)
CHLORIDE: 100 meq/L (ref 96–112)
CO2: 24 meq/L (ref 19–32)
Calcium: 9.5 mg/dL (ref 8.4–10.5)
Creat: 0.72 mg/dL (ref 0.50–1.10)
GFR, Est African American: >89 mL/min
GFR, Est Non African American: >89 mL/min
Glucose, Bld: 90 mg/dL (ref 70–99)
Potassium: 4.1 mEq/L (ref 3.5–5.3)
Sodium: 138 mEq/L (ref 135–145)

## 2014-03-14 LAB — HEPATIC FUNCTION PANEL
ALBUMIN: 4.7 g/dL (ref 3.5–5.2)
ALT: 20 U/L (ref 0–35)
AST: 17 U/L (ref 0–37)
Alkaline Phosphatase: 73 U/L (ref 39–117)
BILIRUBIN TOTAL: 0.7 mg/dL (ref 0.2–1.2)
Bilirubin, Direct: 0.1 mg/dL (ref 0.0–0.3)
Indirect Bilirubin: 0.6 mg/dL (ref 0.2–1.2)
Total Protein: 7 g/dL (ref 6.0–8.3)

## 2014-03-14 LAB — CBC WITH DIFFERENTIAL/PLATELET
Basophils Absolute: 0 10*3/uL (ref 0.0–0.1)
Basophils Relative: 0 % (ref 0–1)
Eosinophils Absolute: 0.1 10*3/uL (ref 0.0–0.7)
Eosinophils Relative: 1 % (ref 0–5)
HEMATOCRIT: 43.2 % (ref 36.0–46.0)
HEMOGLOBIN: 14.6 g/dL (ref 12.0–15.0)
LYMPHS PCT: 36 % (ref 12–46)
Lymphs Abs: 2.6 10*3/uL (ref 0.7–4.0)
MCH: 30 pg (ref 26.0–34.0)
MCHC: 33.8 g/dL (ref 30.0–36.0)
MCV: 88.7 fL (ref 78.0–100.0)
MONO ABS: 0.6 10*3/uL (ref 0.1–1.0)
MONOS PCT: 8 % (ref 3–12)
MPV: 10.6 fL (ref 8.6–12.4)
NEUTROS ABS: 4 10*3/uL (ref 1.7–7.7)
NEUTROS PCT: 55 % (ref 43–77)
Platelets: 282 10*3/uL (ref 150–400)
RBC: 4.87 MIL/uL (ref 3.87–5.11)
RDW: 13.8 % (ref 11.5–15.5)
WBC: 7.3 10*3/uL (ref 4.0–10.5)

## 2014-03-14 LAB — IRON AND TIBC
%SAT: 47 % (ref 20–55)
Iron: 170 ug/dL — ABNORMAL HIGH (ref 42–145)
TIBC: 362 ug/dL (ref 250–470)
UIBC: 192 ug/dL (ref 125–400)

## 2014-03-14 LAB — MAGNESIUM: Magnesium: 2 mg/dL (ref 1.5–2.5)

## 2014-03-14 LAB — INSULIN, FASTING: INSULIN FASTING, SERUM: 5 u[IU]/mL (ref 2.0–19.6)

## 2014-03-14 LAB — URINALYSIS, MICROSCOPIC ONLY
Bacteria, UA: NONE SEEN
CASTS: NONE SEEN
Crystals: NONE SEEN
SQUAMOUS EPITHELIAL / LPF: NONE SEEN

## 2014-03-14 LAB — LIPID PANEL
CHOLESTEROL: 221 mg/dL — AB (ref 0–200)
HDL: 95 mg/dL (ref >=46)
LDL Cholesterol: 104 mg/dL — ABNORMAL HIGH (ref 0–99)
TRIGLYCERIDES: 111 mg/dL (ref <150)
Total CHOL/HDL Ratio: 2.3 Ratio
VLDL: 22 mg/dL (ref 0–40)

## 2014-03-14 LAB — MICROALBUMIN / CREATININE URINE RATIO
CREATININE, URINE: 61.5 mg/dL
MICROALB/CREAT RATIO: 3.3 mg/g (ref 0.0–30.0)
Microalb, Ur: 0.2 mg/dL (ref <2.0)

## 2014-03-14 LAB — HEMOGLOBIN A1C
Hgb A1c MFr Bld: 5.9 % — ABNORMAL HIGH (ref <5.7)
Mean Plasma Glucose: 123 mg/dL — ABNORMAL HIGH (ref <117)

## 2014-03-14 LAB — VITAMIN D 25 HYDROXY (VIT D DEFICIENCY, FRACTURES): VIT D 25 HYDROXY: 84 ng/mL (ref 30–100)

## 2014-03-14 LAB — TSH: TSH: 1.526 u[IU]/mL (ref 0.350–4.500)

## 2014-03-14 LAB — VITAMIN B12: Vitamin B-12: 343 pg/mL (ref 211–911)

## 2014-03-22 ENCOUNTER — Other Ambulatory Visit: Payer: Self-pay | Admitting: Physician Assistant

## 2014-04-17 ENCOUNTER — Ambulatory Visit (INDEPENDENT_AMBULATORY_CARE_PROVIDER_SITE_OTHER): Payer: Commercial Managed Care - PPO | Admitting: Internal Medicine

## 2014-04-17 ENCOUNTER — Encounter: Payer: Self-pay | Admitting: Internal Medicine

## 2014-04-17 VITALS — BP 120/82 | HR 85 | Resp 18 | Ht 68.75 in | Wt 193.0 lb

## 2014-04-17 DIAGNOSIS — R197 Diarrhea, unspecified: Secondary | ICD-10-CM

## 2014-04-17 DIAGNOSIS — Z23 Encounter for immunization: Secondary | ICD-10-CM

## 2014-04-17 DIAGNOSIS — N898 Other specified noninflammatory disorders of vagina: Secondary | ICD-10-CM

## 2014-04-17 DIAGNOSIS — R300 Dysuria: Secondary | ICD-10-CM

## 2014-04-17 DIAGNOSIS — Z111 Encounter for screening for respiratory tuberculosis: Secondary | ICD-10-CM

## 2014-04-17 LAB — CBC WITH DIFFERENTIAL/PLATELET
Basophils Absolute: 0 10*3/uL (ref 0.0–0.1)
Basophils Relative: 0 % (ref 0–1)
EOS ABS: 0.1 10*3/uL (ref 0.0–0.7)
Eosinophils Relative: 1 % (ref 0–5)
HCT: 42.1 % (ref 36.0–46.0)
Hemoglobin: 14.3 g/dL (ref 12.0–15.0)
Lymphocytes Relative: 30 % (ref 12–46)
Lymphs Abs: 2.6 10*3/uL (ref 0.7–4.0)
MCH: 29.6 pg (ref 26.0–34.0)
MCHC: 34 g/dL (ref 30.0–36.0)
MCV: 87.2 fL (ref 78.0–100.0)
MONOS PCT: 7 % (ref 3–12)
MPV: 10.3 fL (ref 8.6–12.4)
Monocytes Absolute: 0.6 10*3/uL (ref 0.1–1.0)
Neutro Abs: 5.3 10*3/uL (ref 1.7–7.7)
Neutrophils Relative %: 62 % (ref 43–77)
Platelets: 300 10*3/uL (ref 150–400)
RBC: 4.83 MIL/uL (ref 3.87–5.11)
RDW: 13.7 % (ref 11.5–15.5)
WBC: 8.5 10*3/uL (ref 4.0–10.5)

## 2014-04-17 LAB — BASIC METABOLIC PANEL WITH GFR
BUN: 11 mg/dL (ref 6–23)
CHLORIDE: 103 meq/L (ref 96–112)
CO2: 28 mEq/L (ref 19–32)
Calcium: 9.2 mg/dL (ref 8.4–10.5)
Creat: 0.59 mg/dL (ref 0.50–1.10)
GFR, Est African American: >89 mL/min
GFR, Est Non African American: >89 mL/min
Glucose, Bld: 86 mg/dL (ref 70–99)
POTASSIUM: 4.2 meq/L (ref 3.5–5.3)
SODIUM: 139 meq/L (ref 135–145)

## 2014-04-17 LAB — HEPATIC FUNCTION PANEL
ALBUMIN: 4.4 g/dL (ref 3.5–5.2)
ALT: 25 U/L (ref 0–35)
AST: 21 U/L (ref 0–37)
Alkaline Phosphatase: 72 U/L (ref 39–117)
Bilirubin, Direct: 0.1 mg/dL (ref 0.0–0.3)
Indirect Bilirubin: 0.4 mg/dL (ref 0.2–1.2)
Total Bilirubin: 0.5 mg/dL (ref 0.2–1.2)
Total Protein: 6.8 g/dL (ref 6.0–8.3)

## 2014-04-17 NOTE — Patient Instructions (Signed)
Abdominal Hysterectomy, Care After °These instructions give you information on caring for yourself after your procedure. Your doctor may also give you more specific instructions. Call your doctor if you have any problems or questions after your procedure.  °HOME CARE °It takes 4-6 weeks to recover from this surgery. Follow all of your doctor's instructions.  °· Only take medicines as told by your doctor. °· Change your bandage as told by your doctor. °· Return to your doctor to have your stitches taken out. °· Take showers for 2-3 weeks. Ask your doctor when it is okay to shower. °· Do not douche, use tampons, or have sex (intercourse) for at least 6 weeks or as told. °· Follow your doctor's advice about exercise, lifting objects, driving, and general activities. °· Get plenty of rest and sleep. °· Do not lift anything heavier than a gallon of milk (about 10 pounds [4.5 kilograms]) for the first month after surgery. °· Get back to your normal diet as told by your doctor. °· Do not drink alcohol until your doctor says it is okay. °· Take a medicine to help you poop (laxative) as told by your doctor. °· Eating foods high in fiber may help you poop. Eat a lot of raw fruits and vegetables, whole grains, and beans. °· Drink enough fluids to keep your pee (urine) clear or pale yellow. °· Have someone help you at home for 1-2 weeks after your surgery. °· Keep follow-up doctor visits as told. °GET HELP IF: °· You have chills or fever. °· You have puffiness, redness, or pain in area of the cut (incision). °· You have yellowish-white fluid (pus) coming from the cut. °· You have a bad smell coming from the cut or bandage. °· Your cut pulls apart. °· You feel dizzy or light-headed. °· You have pain or bleeding when you pee. °· You keep having watery poop (diarrhea). °· You keep feeling sick to your stomach (nauseous) or keep throwing up (vomiting). °· You have fluid (discharge) coming from your vagina. °· You have a  rash. °· You have a reaction to your medicine. °· You need stronger pain medicine. °GET HELP RIGHT AWAY IF:  °· You have a fever and your symptoms suddenly get worse. °· You have bad belly (abdominal) pain. °· You have chest pain. °· You are short of breath. °· You pass out (faint). °· You have pain, puffiness, or redness of your leg. °· You bleed a lot from your vagina and notice clumps of tissue (clots). °MAKE SURE YOU:  °· Understand these instructions. °· Will watch your condition. °· Will get help right away if you are not doing well or get worse. °Document Released: 10/09/2007 Document Revised: 01/04/2013 Document Reviewed: 10/22/2012 °[employer]® Patient Information ©2015 [employer]. This information is not intended to replace advice given to you by your health care provider. Make sure you discuss any questions you have with your health care provider. ° °

## 2014-04-17 NOTE — Progress Notes (Signed)
Subjective:    Patient ID: Taylor Li, female    DOB: 1950/07/07, 64 y.o.   MRN: [id number]  HPI  Patient is a 64 y.o. Female who presents to the office for evaluation of diarrhea.  She reports that she has been having some diarrhea for the past several months.  She reports that her stool has been normalizing, but now her stool is an Photographer.  She reports some improvement.  She reports that when she does have a BM she has some back pain with it.  She reports some chocolate milk like discharge in her underwear.  She reports that this has continued.  She believes that this is associated with some crampy achey sensation in her RLQ and right lower back.  She believes that this is a vaginal discharge.  She does report a hysterctomy.without oophorectomy.    She reports no travel outside the country.  No suspicious food  Intake.  No antibiotic use recently.  She does not see any OBGyn.    Review of Systems  Constitutional: Negative for fever, chills and fatigue.  Respiratory: Negative for cough, chest tightness, shortness of breath and wheezing.   Cardiovascular: Negative for chest pain, palpitations and leg swelling.  Gastrointestinal: Positive for abdominal pain and diarrhea. Negative for nausea, vomiting, constipation and abdominal distention.  Genitourinary: Positive for vaginal discharge. Negative for dysuria, urgency, frequency, hematuria, vaginal bleeding, difficulty urinating and vaginal pain.  Neurological: Negative for dizziness.       Objective:   Physical Exam  Constitutional: She is oriented to person, place, and time. She appears well-developed and well-nourished. No distress.  HENT:  Head: Normocephalic and atraumatic.  Mouth/Throat: Oropharynx is clear and moist. No oropharyngeal exudate.  Eyes: Conjunctivae and EOM are normal. Pupils are equal, round, and reactive to light. No scleral icterus.  Neck: Normal range of motion. Neck supple. No JVD present. No thyromegaly  present.  Cardiovascular: Normal rate, regular rhythm, normal heart sounds and intact distal pulses.  Exam reveals no gallop and no friction rub.   No murmur heard. Pulmonary/Chest: Effort normal and breath sounds normal. No respiratory distress. She has no wheezes. She has no rales. She exhibits no tenderness.  Abdominal: Soft. Bowel sounds are normal. She exhibits no distension and no mass. There is no tenderness. There is no rebound and no guarding.  Genitourinary: No labial fusion. There is no rash, tenderness, lesion or injury on the right labia. There is no rash, tenderness, lesion or injury on the left labia. There is erythema and bleeding in the vagina. No tenderness in the vagina. No foreign body around the vagina. No signs of injury around the vagina. Vaginal discharge found.  Yellow thin discharge in the vaginal vault.  Cervix not identified.  May be surgically absent.    Musculoskeletal: Normal range of motion.  Lymphadenopathy:    She has no cervical adenopathy.  Neurological: She is alert and oriented to person, place, and time.  Skin: Skin is warm and dry. She is not diaphoretic.  Psychiatric: She has a normal mood and affect. Her behavior is normal. Judgment and thought content normal.  Nursing note and vitals reviewed.         Assessment & Plan:    1. Vaginal discharge -GC - WET PREP BY MOLECULAR PROBE -Referral to ObGyn pending results.  2. Need for vaccination to prevent tuberculosis  - PPD  3. Diarrhea Given Viberzi samples to try doubt that this is infectious or C. Diff.  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 - CBC with Differential/Platelet - BASIC METABOLIC PANEL WITH GFR - Culture, Stool - Clostridium Difficile by PCR - Hepatic function panel  4. Dysuria  - Urinalysis, Routine w reflex microscopic - Culture, Urine

## 2014-04-18 ENCOUNTER — Other Ambulatory Visit: Payer: Self-pay | Admitting: Internal Medicine

## 2014-04-18 LAB — URINALYSIS, ROUTINE W REFLEX MICROSCOPIC
Bilirubin Urine: NEGATIVE
Glucose, UA: NEGATIVE mg/dL
Hgb urine dipstick: NEGATIVE
Ketones, ur: NEGATIVE mg/dL
Leukocytes, UA: NEGATIVE
Nitrite: NEGATIVE
Protein, ur: NEGATIVE mg/dL
SPECIFIC GRAVITY, URINE: 1.006 (ref 1.005–1.030)
UROBILINOGEN UA: 0.2 mg/dL (ref 0.0–1.0)
pH: 6 (ref 5.0–8.0)

## 2014-04-18 LAB — WET PREP BY MOLECULAR PROBE
Candida species: NEGATIVE
Gardnerella vaginalis: POSITIVE — AB
Trichomonas vaginosis: NEGATIVE

## 2014-04-18 MED ORDER — METRONIDAZOLE 0.75 % VA GEL: 1.0000 | Freq: Every day | VAGINAL | Status: DC

## 2014-04-19 ENCOUNTER — Other Ambulatory Visit: Payer: Self-pay | Admitting: Internal Medicine

## 2014-04-19 LAB — GC/CHLAMYDIA PROBE AMP
CT Probe RNA: NEGATIVE
GC Probe RNA: NEGATIVE

## 2014-04-19 LAB — URINE CULTURE: Colony Count: 40000

## 2014-04-19 LAB — CLOSTRIDIUM DIFFICILE BY PCR: Toxigenic C. Difficile by PCR: NOT DETECTED

## 2014-04-19 MED ORDER — NITROFURANTOIN MONOHYD MACRO 100 MG PO CAPS: 100.0000 mg | ORAL_CAPSULE | Freq: Two times a day (BID) | ORAL | Status: DC

## 2014-04-20 ENCOUNTER — Encounter: Payer: Self-pay | Admitting: Internal Medicine

## 2014-04-22 LAB — STOOL CULTURE

## 2014-05-09 LAB — TB SKIN TEST
INDURATION: 0 mm
TB Skin Test: NEGATIVE

## 2014-06-20 ENCOUNTER — Other Ambulatory Visit: Payer: Self-pay | Admitting: Internal Medicine

## 2014-07-18 ENCOUNTER — Other Ambulatory Visit: Payer: Self-pay | Admitting: Internal Medicine

## 2014-07-18 ENCOUNTER — Other Ambulatory Visit: Payer: Self-pay | Admitting: Physician Assistant

## 2014-09-26 ENCOUNTER — Ambulatory Visit: Payer: Self-pay | Admitting: Internal Medicine

## 2014-10-18 ENCOUNTER — Other Ambulatory Visit: Payer: Self-pay | Admitting: Internal Medicine

## 2014-11-18 ENCOUNTER — Other Ambulatory Visit: Payer: Self-pay | Admitting: Internal Medicine

## 2015-01-23 ENCOUNTER — Other Ambulatory Visit: Payer: Self-pay | Admitting: Internal Medicine

## 2015-01-25 ENCOUNTER — Encounter: Payer: Self-pay | Admitting: Internal Medicine

## 2015-01-25 ENCOUNTER — Encounter (INDEPENDENT_AMBULATORY_CARE_PROVIDER_SITE_OTHER): Payer: Self-pay

## 2015-01-25 ENCOUNTER — Ambulatory Visit (INDEPENDENT_AMBULATORY_CARE_PROVIDER_SITE_OTHER): Payer: Commercial Managed Care - PPO | Admitting: Internal Medicine

## 2015-01-25 ENCOUNTER — Ambulatory Visit: Payer: Self-pay | Admitting: Internal Medicine

## 2015-01-25 VITALS — BP 128/86 | HR 88 | Temp 98.0°F | Resp 18 | Ht 68.75 in | Wt 194.0 lb

## 2015-01-25 DIAGNOSIS — R110 Nausea: Secondary | ICD-10-CM

## 2015-01-25 DIAGNOSIS — R197 Diarrhea, unspecified: Secondary | ICD-10-CM | POA: Diagnosis not present

## 2015-01-25 DIAGNOSIS — J0110 Acute frontal sinusitis, unspecified: Secondary | ICD-10-CM

## 2015-01-25 LAB — CBC WITH DIFFERENTIAL/PLATELET
BASOS ABS: 0 10*3/uL (ref 0.0–0.1)
BASOS PCT: 0 % (ref 0–1)
Eosinophils Absolute: 0.1 10*3/uL (ref 0.0–0.7)
Eosinophils Relative: 1 % (ref 0–5)
HCT: 44.3 % (ref 36.0–46.0)
HEMOGLOBIN: 14.9 g/dL (ref 12.0–15.0)
Lymphocytes Relative: 35 % (ref 12–46)
Lymphs Abs: 2.6 10*3/uL (ref 0.7–4.0)
MCH: 29.8 pg (ref 26.0–34.0)
MCHC: 33.6 g/dL (ref 30.0–36.0)
MCV: 88.6 fL (ref 78.0–100.0)
MONO ABS: 0.7 10*3/uL (ref 0.1–1.0)
MPV: 10.7 fL (ref 8.6–12.4)
Monocytes Relative: 9 % (ref 3–12)
Neutro Abs: 4.1 10*3/uL (ref 1.7–7.7)
Neutrophils Relative %: 55 % (ref 43–77)
Platelets: 297 10*3/uL (ref 150–400)
RBC: 5 MIL/uL (ref 3.87–5.11)
RDW: 13.6 % (ref 11.5–15.5)
WBC: 7.4 10*3/uL (ref 4.0–10.5)

## 2015-01-25 MED ORDER — ONDANSETRON 4 MG PO TBDP: ORAL_TABLET | ORAL | Status: DC

## 2015-01-25 MED ORDER — DEXAMETHASONE 0.5 MG PO TABS: ORAL_TABLET | ORAL | Status: DC

## 2015-01-25 MED ORDER — TRIAMCINOLONE ACETONIDE 55 MCG/ACT NA AERO: 1.0000 | INHALATION_SPRAY | Freq: Two times a day (BID) | NASAL | Status: DC

## 2015-01-25 MED ORDER — AZITHROMYCIN 250 MG PO TABS: ORAL_TABLET | ORAL | Status: DC

## 2015-01-25 NOTE — Progress Notes (Signed)
Subjective:    Patient ID: Taylor Li, female    DOB: [birth year], 65 y.o.   MRN: [id number]  Abdominal Pain Associated symptoms include constipation, diarrhea, a fever (subjective), headaches and nausea. Pertinent negatives include no dysuria, hematuria or vomiting.  Patient presents to the office for evaluation of headache, nausea and diarrhea last week.  She reports that she still is having nausea and has also been having a dull headache for the past week.  She reports that she did take tylenol which she felt was giving her constipation.  She was take 1,000 mg of extra strength tylenol once daily.  She reports some right upper quadrant pain which radiates to her back and also some lower back pain.  She reports that she is taking 40 mg of citalopram.  She reports that this is helping.  Patient reports that she does not have a gallbladder.  She reports that she has just been run down and feeling bad.  She reports that she has been having a lot of lability with her weight.  She reports that she did try viberzi and had a lot of fatigue.    The headache is intermittent and goes across her eyes.  She reports that the pain waxes and wanes.  She feels like her ears are mildly stopped up.  She has been trying to take Vit C.  She has had a lot of runny nose and now some nasal congestion.  She reports that her husband has had a viral gastroenteritis and also the upper respiratory virus has been going around work.      Review of Systems  Constitutional: Positive for fever (subjective), chills and fatigue.  HENT: Positive for congestion, ear pain, rhinorrhea and sinus pressure. Negative for nosebleeds, postnasal drip, sore throat and voice change.   Respiratory: Negative for cough, chest tightness, shortness of breath and wheezing.   Gastrointestinal: Positive for nausea, abdominal pain, diarrhea and constipation. Negative for vomiting.  Genitourinary: Positive for urgency. Negative for dysuria,  hematuria, flank pain and difficulty urinating.  Neurological: Positive for dizziness, light-headedness and headaches. Negative for weakness and numbness.  Psychiatric/Behavioral: Negative for confusion.       Objective:   Physical Exam  Constitutional: She is oriented to person, place, and time. She appears well-developed and well-nourished. No distress.  HENT:  Head: Normocephalic.  Right Ear: A middle ear effusion is present.  Left Ear: A middle ear effusion is present.  Nose: Mucosal edema present.  Mouth/Throat: Uvula is midline and oropharynx is clear and moist. No trismus in the jaw. No oropharyngeal exudate.  Eyes: Conjunctivae are normal. No scleral icterus.  Neck: Normal range of motion. Neck supple.  Cardiovascular: Normal rate, regular rhythm, normal heart sounds and intact distal pulses.  Exam reveals no gallop and no friction rub.   No murmur heard. Pulmonary/Chest: Effort normal and breath sounds normal. No respiratory distress. She has no wheezes. She has no rales. She exhibits no tenderness.  Abdominal: Soft. Bowel sounds are normal. She exhibits no distension and no mass. There is tenderness. There is no rebound and no guarding.  Musculoskeletal: Normal range of motion.  Neurological: She is alert and oriented to person, place, and time.  Skin: Skin is warm and dry. She is not diaphoretic.  Psychiatric: She has a normal mood and affect. Her behavior is normal. Judgment and thought content normal.  Nursing note and vitals reviewed.   Filed Vitals:   01/25/15 1457  BP: 128/86  Pulse: 88  Temp: 98 F (36.7 C)  Resp: 18         Assessment & Plan:    1. Acute frontal sinusitis, recurrence not specified  - dexamethasone (DECADRON) 0.5 MG tablet; Take 3 tablets x 3 days, take 2 tablets x 3 days, take 1 tablet x 3 days, take 1/2 tablet x 3 days  Dispense: 30 tablet; Refill: 0 - azithromycin (ZITHROMAX Z-PAK) 250 MG tablet; 2 po day one, then 1 daily x 4 days   Dispense: 6 tablet; Refill: 0 - triamcinolone (NASACORT AQ) 55 MCG/ACT AERO nasal inhaler; Place 1 spray into the nose 2 (two) times daily.  Dispense: 1 Inhaler; Refill: 2  2. Diarrhea, unspecified type  - CBC with Differential/Platelet - BASIC METABOLIC PANEL WITH GFR - Hepatic function panel - Hepatitis Acute Panel - Lipase - Ambulatory referral to Gastroenterology - Fecal fat, qualtitative  3. Nausea without vomiting  - ondansetron (ZOFRAN ODT) 4 MG disintegrating tablet; 4mg  ODT q4 hours prn nausea/vomit  Dispense: 20 tablet; Refill: 0

## 2015-01-25 NOTE — Patient Instructions (Signed)
Please continue to take allegra daily.  Please use 2 sprays per nostril of nasacort, flonase, or rhinocort daily.    Please use saline in your nose as often as tolerated.  Please take zpak until gone.  Please take decadron as prescribed.  Please take zofran as needed every 6 hours.  Please bring back stool collection kit as quickly as possible.

## 2015-01-26 ENCOUNTER — Encounter: Payer: Self-pay | Admitting: Internal Medicine

## 2015-01-26 LAB — BASIC METABOLIC PANEL WITH GFR
BUN: 14 mg/dL (ref 7–25)
CO2: 24 mmol/L (ref 20–31)
Calcium: 9.5 mg/dL (ref 8.6–10.4)
Chloride: 106 mmol/L (ref 98–110)
Creat: 0.71 mg/dL (ref 0.50–0.99)
Glucose, Bld: 93 mg/dL (ref 65–99)
Potassium: 4 mmol/L (ref 3.5–5.3)
SODIUM: 140 mmol/L (ref 135–146)

## 2015-01-26 LAB — HEPATITIS PANEL, ACUTE
HCV Ab: NEGATIVE
HEP A IGM: NONREACTIVE
HEP B C IGM: NONREACTIVE
Hepatitis B Surface Ag: NEGATIVE

## 2015-01-26 LAB — LIPASE: LIPASE: 25 U/L (ref 7–60)

## 2015-01-26 LAB — HEPATIC FUNCTION PANEL
ALT: 25 U/L (ref 6–29)
AST: 17 U/L (ref 10–35)
Albumin: 4.5 g/dL (ref 3.6–5.1)
Alkaline Phosphatase: 74 U/L (ref 33–130)
BILIRUBIN DIRECT: 0.1 mg/dL (ref <=0.2)
BILIRUBIN INDIRECT: 0.6 mg/dL (ref 0.2–1.2)
Total Bilirubin: 0.7 mg/dL (ref 0.2–1.2)
Total Protein: 7.1 g/dL (ref 6.1–8.1)

## 2015-01-29 ENCOUNTER — Other Ambulatory Visit: Payer: Self-pay | Admitting: Internal Medicine

## 2015-01-29 ENCOUNTER — Other Ambulatory Visit: Payer: Commercial Managed Care - PPO

## 2015-01-29 DIAGNOSIS — R197 Diarrhea, unspecified: Secondary | ICD-10-CM

## 2015-02-02 LAB — FECAL FAT, QUALITATIVE: FECAL FAT QUALITATIVE: ABNORMAL — AB

## 2015-02-03 ENCOUNTER — Other Ambulatory Visit: Payer: Self-pay | Admitting: Internal Medicine

## 2015-02-05 ENCOUNTER — Other Ambulatory Visit: Payer: Self-pay | Admitting: Internal Medicine

## 2015-02-05 ENCOUNTER — Telehealth: Payer: Self-pay | Admitting: *Deleted

## 2015-02-05 DIAGNOSIS — K909 Intestinal malabsorption, unspecified: Secondary | ICD-10-CM

## 2015-02-05 NOTE — Telephone Encounter (Signed)
Patient called and states she was seen at an urgent care for a swollen, infected wound between her toes.  Patient was given Keflex, which she has not started and was told to a specialist.  Per Dr Melford Aase, start the Keflex and the patient will call [facility] for an appointment to evaluate the wound.

## 2015-03-12 ENCOUNTER — Other Ambulatory Visit: Payer: Self-pay | Admitting: Physician Assistant

## 2015-03-12 NOTE — Telephone Encounter (Signed)
Rx called into [facility] pharmacy. 

## 2015-03-13 ENCOUNTER — Encounter: Payer: Self-pay | Admitting: Internal Medicine

## 2015-03-16 ENCOUNTER — Other Ambulatory Visit: Payer: Self-pay | Admitting: Physician Assistant

## 2015-03-19 ENCOUNTER — Encounter: Payer: Self-pay | Admitting: Internal Medicine

## 2015-03-27 ENCOUNTER — Encounter: Payer: Self-pay | Admitting: Internal Medicine

## 2015-03-27 ENCOUNTER — Other Ambulatory Visit (INDEPENDENT_AMBULATORY_CARE_PROVIDER_SITE_OTHER): Payer: Commercial Managed Care - PPO

## 2015-03-27 ENCOUNTER — Ambulatory Visit (INDEPENDENT_AMBULATORY_CARE_PROVIDER_SITE_OTHER): Payer: Commercial Managed Care - PPO | Admitting: Internal Medicine

## 2015-03-27 VITALS — BP 110/80 | HR 72 | Ht 68.75 in | Wt 200.4 lb

## 2015-03-27 DIAGNOSIS — K909 Intestinal malabsorption, unspecified: Secondary | ICD-10-CM

## 2015-03-27 DIAGNOSIS — R1013 Epigastric pain: Secondary | ICD-10-CM | POA: Diagnosis not present

## 2015-03-27 LAB — COMPREHENSIVE METABOLIC PANEL
ALBUMIN: 4.4 g/dL (ref 3.5–5.2)
ALK PHOS: 68 U/L (ref 39–117)
ALT: 19 U/L (ref 0–35)
AST: 14 U/L (ref 0–37)
BILIRUBIN TOTAL: 0.5 mg/dL (ref 0.2–1.2)
BUN: 16 mg/dL (ref 6–23)
CALCIUM: 9.3 mg/dL (ref 8.4–10.5)
CO2: 27 meq/L (ref 19–32)
CREATININE: 0.74 mg/dL (ref 0.40–1.20)
Chloride: 104 mEq/L (ref 96–112)
GFR: 84.27 mL/min (ref >60.00)
Glucose, Bld: 107 mg/dL — ABNORMAL HIGH (ref 70–99)
Potassium: 4 mEq/L (ref 3.5–5.1)
Sodium: 140 mEq/L (ref 135–145)
TOTAL PROTEIN: 7.3 g/dL (ref 6.0–8.3)

## 2015-03-27 NOTE — Progress Notes (Signed)
  Referred by: Starlyn Skeans, PA-C  Subjective:    Patient ID: Taylor Li, female    DOB: 02/15/1950, 65 y.o.   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 She reports right upper quadrant achiness, a grabbing sensation intermittent without clear trigger. The pain reminds her some of the symptoms she had prior to her cholecystectomy. She has frequent postprandial diarrhea and the stools have been arms and foul-smelling. She was given samples of Viberzi but that didn't seem to make a difference. It does not disturb her sleep. She is actually gained some weight. She does believe she has a history of irritable bowel syndrome. There is some associated bloating and gas. There is a prior colonoscopy last September 2015 with a diminutive adenoma and a diminutive hyperplastic polyp, performed by me. She doesn't drink 4-5 alcoholic beverages a week sometimes more and in the past she has had more. There's been some stress and she has been drinking more though she says that's leading up some. She has cut down recently. Medications, allergies, past medical history, past surgical history, family history and social history are reviewed and updated in the EMR.   Review of Systems As above. History of fever fatigue back pain arthritis and joint pains and allergies in the last few months. All other review of systems are negative.    Objective:   Physical Exam @BP  110/80 mmHg  Pulse 72  Ht 5' 8.75" (1.746 m)  Wt 200 lb 6.4 oz (90.901 kg)  BMI 29.82 kg/m2@  General:  Well-developed, well-nourished and in no acute distress Eyes:  anicteric. ENT:   Mouth and posterior pharynx free of lesions.  Neck:   supple w/o thyromegaly or mass.  Lungs: Clear to auscultation bilaterally. Heart:  S1S2, no rubs, murmurs, gallops. Abdomen:  soft, mildly tender LLQ, no  hepatosplenomegaly, hernia, or mass and BS+.   Lymph:  no cervical or supraclavicular adenopathy. Extremities:   no edema, cyanosis or clubbing Skin   no rash. Neuro:  A&O x 3.  Psych:  appropriate mood and  Affect.   Data Reviewed: I reviewed her primary care workup by Ms. Forcucci, there is a positive qualitative fecal fat, normal metabolic panel except for slightly elevated glucose. CBC is normal. The studies were done in January. An acute hepatitis panel was negative as well. C. difficile and stool cultures were done in April of last year and were negative so her diarrhea really has been longer than 6 months or recurs.      Assessment & Plan:   1. Steatorrhea ([place])   2. Abdominal pain, epigastric    She has describes the area and she has a qualitative stool positive for fecal fat. This suggests pancreatic insufficiency. I will order a fecal elastase test and a CT of the abdomen and pelvis to work her up further. Pending those results will determine the next steps.  I appreciate the opportunity to care for this patient. Cc: Starlyn Skeans, NP

## 2015-03-27 NOTE — Patient Instructions (Signed)
  Your physician has requested that you go to the basement for the following lab work before leaving today: Stool studies, CMET    You have been scheduled for a CT scan of the abdomen and pelvis at [facility] ([address]---this is in the same building as [facility]).   You are scheduled on 04/04/15 at 1:00pm. You should arrive 15 minutes prior to your appointment time for registration. Please follow the written instructions below on the day of your exam:  WARNING: IF YOU ARE ALLERGIC TO IODINE/X-RAY DYE, PLEASE NOTIFY RADIOLOGY IMMEDIATELY AT [phone]! YOU WILL BE GIVEN A 13 HOUR PREMEDICATION PREP.  1) Do not eat or drink anything after 9:00AM (4 hours prior to your test) 2) You have been given 2 bottles of oral contrast to drink. The solution may taste better if refrigerated, but do NOT add ice or any other liquid to this solution. Shake well before drinking.    Drink 1 bottle of contrast @ 11:00AM (2 hours prior to your exam)  Drink 1 bottle of contrast @ 12:00PM (1 hour prior to your exam)  You may take any medications as prescribed with a small amount of water except for the following: Metformin, Glucophage, Glucovance, Avandamet, Riomet, Fortamet, Actoplus Met, Janumet, Glumetza or Metaglip. The above medications must be held the day of the exam AND 48 hours after the exam.  The purpose of you drinking the oral contrast is to aid in the visualization of your intestinal tract. The contrast solution may cause some diarrhea. Before your exam is started, you will be given a small amount of fluid to drink. Depending on your individual set of symptoms, you may also receive an intravenous injection of x-ray contrast/dye. Plan on being at [facility] for 30 minutes or longer, depending on the type of exam you are having performed.  This test typically takes 30-45 minutes to complete.  If you have any questions regarding your exam or if you need to reschedule,  you may call the CT department at [phone] between the hours of 8:00 am and 5:00 pm, Monday-Friday.  ________________________________________________________________________  I appreciate the opportunity to care for you.

## 2015-04-02 LAB — PANCREATIC ELASTASE, FECAL: Pancreatic Elastase-1, Stool: >500 mcg/g

## 2015-04-04 ENCOUNTER — Ambulatory Visit (INDEPENDENT_AMBULATORY_CARE_PROVIDER_SITE_OTHER)
Admission: RE | Admit: 2015-04-04 | Discharge: 2015-04-04 | Disposition: A | Discharge status: Home / Self Care | Payer: Commercial Managed Care - PPO | Source: Ambulatory Visit | Attending: Internal Medicine | Admitting: Internal Medicine

## 2015-04-04 DIAGNOSIS — R1013 Epigastric pain: Secondary | ICD-10-CM

## 2015-04-04 DIAGNOSIS — K909 Intestinal malabsorption, unspecified: Secondary | ICD-10-CM | POA: Diagnosis not present

## 2015-04-04 MED ORDER — IOPAMIDOL (ISOVUE-300) INJECTION 61%
100.0000 mL | Freq: Once | INTRAVENOUS | Status: AC | PRN
  Administered 2015-04-04: 100 mL via INTRAVENOUS

## 2015-04-05 NOTE — Progress Notes (Signed)
Quick Note:  Stool test and CT scan do not show any signifcant problems - liver, kidney and ovarian cysts, arteries have plaques - common at her age, and some spine arthritis.  I have seen magnesium supplements cause her problems and possibly fish oil though she has been on these.  Have her stop magnesium and call or My Chart message back re: diarrhea in 2 weeks ______

## 2015-04-16 ENCOUNTER — Encounter: Payer: Self-pay | Admitting: Internal Medicine

## 2015-04-16 ENCOUNTER — Ambulatory Visit (INDEPENDENT_AMBULATORY_CARE_PROVIDER_SITE_OTHER): Payer: Commercial Managed Care - PPO | Admitting: Internal Medicine

## 2015-04-16 VITALS — BP 126/90 | HR 68 | Temp 97.5°F | Resp 16 | Ht 68.0 in | Wt 200.4 lb

## 2015-04-16 DIAGNOSIS — E782 Mixed hyperlipidemia: Secondary | ICD-10-CM

## 2015-04-16 DIAGNOSIS — Z0001 Encounter for general adult medical examination with abnormal findings: Secondary | ICD-10-CM

## 2015-04-16 DIAGNOSIS — Z136 Encounter for screening for cardiovascular disorders: Secondary | ICD-10-CM

## 2015-04-16 DIAGNOSIS — Z111 Encounter for screening for respiratory tuberculosis: Secondary | ICD-10-CM | POA: Diagnosis not present

## 2015-04-16 DIAGNOSIS — Z0000 Encounter for general adult medical examination without abnormal findings: Secondary | ICD-10-CM | POA: Diagnosis not present

## 2015-04-16 DIAGNOSIS — R5383 Other fatigue: Secondary | ICD-10-CM

## 2015-04-16 DIAGNOSIS — Z79899 Other long term (current) drug therapy: Secondary | ICD-10-CM

## 2015-04-16 DIAGNOSIS — R7303 Prediabetes: Secondary | ICD-10-CM

## 2015-04-16 DIAGNOSIS — I10 Essential (primary) hypertension: Secondary | ICD-10-CM | POA: Diagnosis not present

## 2015-04-16 DIAGNOSIS — E559 Vitamin D deficiency, unspecified: Secondary | ICD-10-CM

## 2015-04-16 DIAGNOSIS — Z1212 Encounter for screening for malignant neoplasm of rectum: Secondary | ICD-10-CM

## 2015-04-16 LAB — HEPATIC FUNCTION PANEL
ALBUMIN: 4.3 g/dL (ref 3.6–5.1)
ALK PHOS: 67 U/L (ref 33–130)
ALT: 23 U/L (ref 6–29)
AST: 18 U/L (ref 10–35)
BILIRUBIN DIRECT: 0.1 mg/dL (ref <=0.2)
BILIRUBIN TOTAL: 0.6 mg/dL (ref 0.2–1.2)
Indirect Bilirubin: 0.5 mg/dL (ref 0.2–1.2)
Total Protein: 6.7 g/dL (ref 6.1–8.1)

## 2015-04-16 LAB — LIPID PANEL
CHOL/HDL RATIO: 2.5 ratio (ref <=5.0)
Cholesterol: 185 mg/dL (ref 125–200)
HDL: 74 mg/dL (ref >=46)
LDL CALC: 89 mg/dL (ref <130)
Triglycerides: 110 mg/dL (ref <150)
VLDL: 22 mg/dL (ref <30)

## 2015-04-16 LAB — BASIC METABOLIC PANEL WITH GFR
BUN: 13 mg/dL (ref 7–25)
CHLORIDE: 102 mmol/L (ref 98–110)
CO2: 27 mmol/L (ref 20–31)
CREATININE: 0.74 mg/dL (ref 0.50–0.99)
Calcium: 9.4 mg/dL (ref 8.6–10.4)
GFR, Est African American: >89 mL/min (ref >=60)
GFR, Est Non African American: 87 mL/min (ref >=60)
Glucose, Bld: 94 mg/dL (ref 65–99)
Potassium: 4.1 mmol/L (ref 3.5–5.3)
Sodium: 138 mmol/L (ref 135–146)

## 2015-04-16 LAB — CBC WITH DIFFERENTIAL/PLATELET
Basophils Absolute: 0 cells/uL (ref 0–200)
Basophils Relative: 0 %
EOS ABS: 72 {cells}/uL (ref 15–500)
EOS PCT: 1 %
HCT: 43 % (ref 35.0–45.0)
Hemoglobin: 14.4 g/dL (ref 11.7–15.5)
Lymphocytes Relative: 32 %
Lymphs Abs: 2304 cells/uL (ref 850–3900)
MCH: 29.4 pg (ref 27.0–33.0)
MCHC: 33.5 g/dL (ref 32.0–36.0)
MCV: 87.9 fL (ref 80.0–100.0)
MONOS PCT: 7 %
MPV: 10.3 fL (ref 7.5–12.5)
Monocytes Absolute: 504 cells/uL (ref 200–950)
NEUTROS ABS: 4320 {cells}/uL (ref 1500–7800)
NEUTROS PCT: 60 %
PLATELETS: 290 10*3/uL (ref 140–400)
RBC: 4.89 MIL/uL (ref 3.80–5.10)
RDW: 13.7 % (ref 11.0–15.0)
WBC: 7.2 10*3/uL (ref 3.8–10.8)

## 2015-04-16 LAB — HEMOGLOBIN A1C
HEMOGLOBIN A1C: 5.9 % — AB (ref <5.7)
Mean Plasma Glucose: 123 mg/dL

## 2015-04-16 LAB — MAGNESIUM: Magnesium: 2 mg/dL (ref 1.5–2.5)

## 2015-04-16 LAB — VITAMIN B12: VITAMIN B 12: 322 pg/mL (ref 200–1100)

## 2015-04-16 LAB — IRON AND TIBC
%SAT: 41 % (ref 11–50)
IRON: 134 ug/dL (ref 45–160)
TIBC: 324 ug/dL (ref 250–450)
UIBC: 190 ug/dL (ref 125–400)

## 2015-04-16 LAB — TSH: TSH: 1.2 m[IU]/L

## 2015-04-16 NOTE — Patient Instructions (Signed)
Recommend Adult Low Dose Aspirin or   coated  Aspirin 81 mg daily   To reduce risk of Colon Cancer 20 %,   Skin Cancer 26 % ,   Melanoma 46%   and   Pancreatic cancer 60%   ++++++++++++++++++++++++++++++++++++++++++++++++++++++ Vitamin D goal   is between 70-100.   Please make sure that you are taking your Vitamin D as directed.   It is very important as a natural anti-inflammatory   helping hair, skin, and nails, as well as reducing stroke and heart attack risk.   It helps your bones and helps with mood.  It also decreases numerous cancer risks so please take it as directed.   Low Vit D is associated with a 200-300% higher risk for CANCER   and 200-300% higher risk for HEART   ATTACK  &  STROKE.   .....................................Marland Kitchen  It is also associated with higher death rate at younger ages,   autoimmune diseases like Rheumatoid arthritis, Lupus, Multiple Sclerosis.     Also many other serious conditions, like depression, Alzheimer's  Dementia, infertility, muscle aches, fatigue, fibromyalgia - just to name a few.  ++++++++++++++++++++++++++++++++++++++++++++++++  Recommend the book "The END of DIETING" by Dr Excell Seltzer   & the book "The END of DIABETES " by Dr Excell Seltzer  At [email or web address] - get book & Audio CD's     Being diabetic has a  300% increased risk for heart attack, stroke, cancer, and alzheimer- type vascular dementia. It is very important that you work harder with diet by avoiding all foods that are white. Avoid white rice (brown & wild rice is OK), white potatoes (sweetpotatoes in moderation is OK), White bread or wheat bread or anything made out of white flour like bagels, donuts, rolls, buns, biscuits, cakes, pastries, cookies, pizza crust, and pasta (made from white flour & egg whites) - vegetarian pasta or spinach or wheat pasta is OK. Multigrain breads like Arnold's or [employer], or multigrain sandwich thins or flatbreads.  Diet,  exercise and weight loss can reverse and cure diabetes in the early stages.  Diet, exercise and weight loss is very important in the control and prevention of complications of diabetes which affects every system in your body, ie. Brain - dementia/stroke, eyes - glaucoma/blindness, heart - heart attack/heart failure, kidneys - dialysis, stomach - gastric paralysis, intestines - malabsorption, nerves - severe painful neuritis, circulation - gangrene & loss of a leg(s), and finally cancer and Alzheimers.    I recommend avoid fried & greasy foods,  sweets/candy, white rice (brown or wild rice or Quinoa is OK), white potatoes (sweet potatoes are OK) - anything made from white flour - bagels, doughnuts, rolls, buns, biscuits,white and wheat breads, pizza crust and traditional pasta made of white flour & egg white(vegetarian pasta or spinach or wheat pasta is OK).  Multi-grain bread is OK - like multi-grain flat bread or sandwich thins. Avoid alcohol in excess. Exercise is also important.    Eat all the vegetables you want - avoid meat, especially red meat and dairy - especially cheese.  Cheese is the most concentrated form of trans-fats which is the worst thing to clog up our arteries. Veggie cheese is OK which can be found in the fresh produce section at [employer] or [employer] or [employer]  ++++++++++++++++++++++++++++++++++++++++++++++++++ DASH Eating Plan  DASH stands for "Dietary Approaches to Stop Hypertension."   The DASH eating plan is a healthy eating plan that has been shown to reduce high blood  pressure (hypertension). Additional health benefits may include reducing the risk of type 2 diabetes mellitus, heart disease, and stroke. The DASH eating plan may also help with weight loss.  WHAT DO I NEED TO KNOW ABOUT THE DASH EATING PLAN?  For the DASH eating plan, you will follow these general guidelines:  Choose foods with a percent daily value for sodium of less than 5% (as listed on the food  label).  Use salt-free seasonings or herbs instead of table salt or sea salt.  Check with your health care provider or pharmacist before using salt substitutes.  Eat lower-sodium products, often labeled as "lower sodium" or "no salt added."  Eat fresh foods.  Eat more vegetables, fruits, and low-fat dairy products.    Choose whole grains. Look for the word "whole" as the first word in the ingredient list.  Choose fish   Limit sweets, desserts, sugars, and sugary drinks.  Choose heart-healthy fats.  Eat veggie cheese   Eat more home-cooked food and less restaurant, buffet, and fast food.  Limit fried foods.  [place]uffaker foods using methods other than frying.  Limit canned vegetables. If you do use them, rinse them well to decrease the sodium.  When eating at a restaurant, ask that your food be prepared with less salt, or no salt if possible.                      WHAT FOODS CAN I EAT?  Read Dr Fara Olden Fuhrman's books on The End of Dieting & The End of Diabetes  Grains  Whole grain or whole wheat bread. Brown rice. Whole grain or whole wheat pasta. Quinoa, bulgur, and whole grain cereals. Low-sodium cereals. Corn or whole wheat flour tortillas. Whole grain cornbread. Whole grain crackers. Low-sodium crackers.  Vegetables  Fresh or frozen vegetables (raw, steamed, roasted, or grilled). Low-sodium or reduced-sodium tomato and vegetable juices. Low-sodium or reduced-sodium tomato sauce and paste. Low-sodium or reduced-sodium canned vegetables.   Fruits  All fresh, canned (in natural juice), or frozen fruits.  Protein Products   All fish and seafood.  Dried beans, peas, or lentils. Unsalted nuts and seeds. Unsalted canned beans.  Dairy  Low-fat dairy products, such as skim or 1% milk, 2% or reduced-fat cheeses, low-fat ricotta or cottage cheese, or plain low-fat yogurt. Low-sodium or reduced-sodium cheeses.  Fats and Oils  Tub margarines without trans fats. Light or  reduced-fat mayonnaise and salad dressings (reduced sodium). Avocado. Safflower, olive, or canola oils. Natural peanut or almond butter.  Other  Unsalted popcorn and pretzels. The items listed above may not be a complete list of recommended foods or beverages. Contact your dietitian for more options.  +++++++++++++++++++++++++++++++++++++++++++  WHAT FOODS ARE NOT RECOMMENDED?  Grains/ White flour or wheat flour  White bread. White pasta. White rice. Refined cornbread. Bagels and croissants. Crackers that contain trans fat.  Vegetables  Creamed or fried vegetables. Vegetables in a . Regular canned vegetables. Regular canned tomato sauce and paste. Regular tomato and vegetable juices.  Fruits  Dried fruits. Canned fruit in light or heavy syrup. Fruit juice.  Meat and Other Protein Products  Meat in general - RED mwaet & White meat.  Fatty cuts of meat. Ribs, chicken wings, bacon, sausage, bologna, salami, chitterlings, fatback, hot dogs, bratwurst, and packaged luncheon meats.  Dairy  Whole or 2% milk, cream, half-and-half, and cream cheese. Whole-fat or sweetened yogurt. Full-fat cheeses or blue cheese. Nondairy creamers and whipped toppings. Processed cheese, cheese spreads, or  cheese curds.  Condiments  Onion and garlic salt, seasoned salt, table salt, and sea salt. Canned and packaged gravies. Worcestershire sauce. Tartar sauce. Barbecue sauce. Teriyaki sauce. Soy sauce, including reduced sodium. Steak sauce. Fish sauce. Oyster sauce. Cocktail sauce. Horseradish. Ketchup and mustard. Meat flavorings and tenderizers. Bouillon cubes. Hot sauce. Tabasco sauce. Marinades. Taco seasonings. Relishes.  Fats and Oils Butter, stick margarine, lard, shortening and bacon fat. Coconut, palm kernel, or palm oils. Regular salad dressings.  Pickles and olives. Salted popcorn and pretzels.  The items listed above may not be a complete list of foods and beverages to avoid.   Preventive  Care for Adults  A healthy lifestyle and preventive care can promote health and wellness. Preventive health guidelines for women include the following key practices.  A routine yearly physical is a good way to check with your health care provider about your health and preventive screening. It is a chance to share any concerns and updates on your health and to receive a thorough exam.  Visit your dentist for a routine exam and preventive care every 6 months. Brush your teeth twice a day and floss once a day. Good oral hygiene prevents tooth decay and gum disease.  The frequency of eye exams is based on your age, health, family medical history, use of contact lenses, and other factors. Follow your health care provider's recommendations for frequency of eye exams.  Eat a healthy diet. Foods like vegetables, fruits, whole grains, low-fat dairy products, and lean protein foods contain the nutrients you need without too many calories. Decrease your intake of foods high in solid fats, added sugars, and salt. Eat the right amount of calories for you.Get information about a proper diet from your health care provider, if necessary.  Regular physical exercise is one of the most important things you can do for your health. Most adults should get at least 150 minutes of moderate-intensity exercise (any activity that increases your heart rate and causes you to sweat) each week. In addition, most adults need muscle-strengthening exercises on 2 or more days a week.  Maintain a healthy weight. The body mass index (BMI) is a screening tool to identify possible weight problems. It provides an estimate of body fat based on height and weight. Your health care provider can find your BMI and can help you achieve or maintain a healthy weight.For adults 20 years and older:  A BMI below 18.5 is considered underweight.  A BMI of 18.5 to 24.9 is normal.  A BMI of 25 to 29.9 is considered overweight.  A BMI of 30 and  above is considered obese.  Maintain normal blood lipids and cholesterol levels by exercising and minimizing your intake of saturated fat. Eat a balanced diet with plenty of fruit and vegetables. Blood tests for lipids and cholesterol should begin at age 21 and be repeated every 5 years. If your lipid or cholesterol levels are high, you are over 50, or you are at high risk for heart disease, you may need your cholesterol levels checked more frequently.Ongoing high lipid and cholesterol levels should be treated with medicines if diet and exercise are not working.  If you smoke, find out from your health care provider how to quit. If you do not use tobacco, do not start.  Lung cancer screening is recommended for adults aged 31-80 years who are at high risk for developing lung cancer because of a history of smoking. A yearly low-dose CT scan of the lungs is recommended  for people who have at least a 30-pack-year history of smoking and are a current smoker or have quit within the past 15 years. A pack year of smoking is smoking an average of 1 pack of cigarettes a day for 1 year (for example: 1 pack a day for 30 years or 2 packs a day for 15 years). Yearly screening should continue until the smoker has stopped smoking for at least 15 years. Yearly screening should be stopped for people who develop a health problem that would prevent them from having lung cancer treatment.  High blood pressure causes heart disease and increases the risk of stroke. Your blood pressure should be checked at least every 1 to 2 years. Ongoing high blood pressure should be treated with medicines if weight loss and exercise do not work.  If you are 30-80 years old, ask your health care provider if you should take aspirin to prevent strokes.  Diabetes screening involves taking a blood sample to check your fasting blood sugar level. This should be done once every 3 years, after age 61, if you are within normal weight and without risk  factors for diabetes. Testing should be considered at a younger age or be carried out more frequently if you are overweight and have at least 1 risk factor for diabetes.  Breast cancer screening is essential preventive care for women. You should practice "breast self-awareness." This means understanding the normal appearance and feel of your breasts and may include breast self-examination. Any changes detected, no matter how small, should be reported to a health care provider. Women in their 77s and 30s should have a clinical breast exam (CBE) by a health care provider as part of a regular health exam every 1 to 3 years. After age 81, women should have a CBE every year. Starting at age 20, women should consider having a mammogram (breast X-ray test) every year. Women who have a family history of breast cancer should talk to their health care provider about genetic screening. Women at a high risk of breast cancer should talk to their health care providers about having an MRI and a mammogram every year.  Breast cancer gene (BRCA)-related cancer risk assessment is recommended for women who have family members with BRCA-related cancers. BRCA-related cancers include breast, ovarian, tubal, and peritoneal cancers. Having family members with these cancers may be associated with an increased risk for harmful changes (mutations) in the breast cancer genes BRCA1 and BRCA2. Results of the assessment will determine the need for genetic counseling and BRCA1 and BRCA2 testing.  Routine pelvic exams to screen for cancer are no longer recommended for nonpregnant women who are considered low risk for cancer of the pelvic organs (ovaries, uterus, and vagina) and who do not have symptoms. Ask your health care provider if a screening pelvic exam is right for you.  If you have had past treatment for cervical cancer or a condition that could lead to cancer, you need Pap tests and screening for cancer for at least 20 years after  your treatment. If Pap tests have been discontinued, your risk factors (such as having a new sexual partner) need to be reassessed to determine if screening should be resumed. Some women have medical problems that increase the chance of getting cervical cancer. In these cases, your health care provider may recommend more frequent screening and Pap tests.  Colorectal cancer can be detected and often prevented. Most routine colorectal cancer screening begins at the age of 22 years and  continues through age 74 years. However, your health care provider may recommend screening at an earlier age if you have risk factors for colon cancer. On a yearly basis, your health care provider may provide home test kits to check for hidden blood in the stool. Use of a small camera at the end of a tube, to directly examine the colon (sigmoidoscopy or colonoscopy), can detect the earliest forms of colorectal cancer. Talk to your health care provider about this at age 22, when routine screening begins. Direct exam of the colon should be repeated every 5-10 years through age 76 years, unless early forms of pre-cancerous polyps or small growths are found.  Hepatitis C blood testing is recommended for all people born from [birth year] through 1963 and any individual with known risks for hepatitis C.  Pra  Osteoporosis is a disease in which the bones lose minerals and strength with aging. This can result in serious bone fractures or breaks. The risk of osteoporosis can be identified using a bone density scan. Women ages 88 years and over and women at risk for fractures or osteoporosis should discuss screening with their health care providers. Ask your health care provider whether you should take a calcium supplement or vitamin D to reduce the rate of osteoporosis.  Menopause can be associated with physical symptoms and risks. Hormone replacement therapy is available to decrease symptoms and risks. You should talk to your health care  provider about whether hormone replacement therapy is right for you.  Use sunscreen. Apply sunscreen liberally and repeatedly throughout the day. You should seek shade when your shadow is shorter than you. Protect yourself by wearing long sleeves, pants, a wide-brimmed hat, and sunglasses year round, whenever you are outdoors.  Once a month, do a whole body skin exam, using a mirror to look at the skin on your back. Tell your health care provider of new moles, moles that have irregular borders, moles that are larger than a pencil eraser, or moles that have changed in shape or color.  Stay current with required vaccines (immunizations).  Influenza vaccine. All adults should be immunized every year.  Tetanus, diphtheria, and acellular pertussis (Td, Tdap) vaccine. Pregnant women should receive 1 dose of Tdap vaccine during each pregnancy. The dose should be obtained regardless of the length of time since the last dose. Immunization is preferred during the 27th-36th week of gestation. An adult who has not previously received Tdap or who does not know her vaccine status should receive 1 dose of Tdap. This initial dose should be followed by tetanus and diphtheria toxoids (Td) booster doses every 10 years. Adults with an unknown or incomplete history of completing a 3-dose immunization series with Td-containing vaccines should begin or complete a primary immunization series including a Tdap dose. Adults should receive a Td booster every 10 years.  Varicella vaccine. An adult without evidence of immunity to varicella should receive 2 doses or a second dose if she has previously received 1 dose. Pregnant females who do not have evidence of immunity should receive the first dose after pregnancy. This first dose should be obtained before leaving the health care facility. The second dose should be obtained 4-8 weeks after the first dose.  Human papillomavirus (HPV) vaccine. Females aged 13-26 years who have not  received the vaccine previously should obtain the 3-dose series. The vaccine is not recommended for use in pregnant females. However, pregnancy testing is not needed before receiving a dose. If a female is found to  be pregnant after receiving a dose, no treatment is needed. In that case, the remaining doses should be delayed until after the pregnancy. Immunization is recommended for any person with an immunocompromised condition through the age of 14 years if she did not get any or all doses earlier. During the 3-dose series, the second dose should be obtained 4-8 weeks after the first dose. The third dose should be obtained 24 weeks after the first dose and 16 weeks after the second dose.  Zoster vaccine. One dose is recommended for adults aged 87 years or older unless certain conditions are present.  Measles, mumps, and rubella (MMR) vaccine. Adults born before [birth year] generally are considered immune to measles and mumps. Adults born in [birth year] or later should have 1 or more doses of MMR vaccine unless there is a contraindication to the vaccine or there is laboratory evidence of immunity to each of the three diseases. A routine second dose of MMR vaccine should be obtained at least 28 days after the first dose for students attending postsecondary schools, health care workers, or international travelers. People who received inactivated measles vaccine or an unknown type of measles vaccine during 1963-1967 should receive 2 doses of MMR vaccine. People who received inactivated mumps vaccine or an unknown type of mumps vaccine before 1979 and are at high risk for mumps infection should consider immunization with 2 doses of MMR vaccine. For females of childbearing age, rubella immunity should be determined. If there is no evidence of immunity, females who are not pregnant should be vaccinated. If there is no evidence of immunity, females who are pregnant should delay immunization until after pregnancy. Unvaccinated  health care workers born before [birth year] who lack laboratory evidence of measles, mumps, or rubella immunity or laboratory confirmation of disease should consider measles and mumps immunization with 2 doses of MMR vaccine or rubella immunization with 1 dose of MMR vaccine.  Pneumococcal 13-valent conjugate (PCV13) vaccine. When indicated, a person who is uncertain of her immunization history and has no record of immunization should receive the PCV13 vaccine. An adult aged 48 years or older who has certain medical conditions and has not been previously immunized should receive 1 dose of PCV13 vaccine. This PCV13 should be followed with a dose of pneumococcal polysaccharide (PPSV23) vaccine. The PPSV23 vaccine dose should be obtained at least 8 weeks after the dose of PCV13 vaccine. An adult aged 40 years or older who has certain medical conditions and previously received 1 or more doses of PPSV23 vaccine should receive 1 dose of PCV13. The PCV13 vaccine dose should be obtained 1 or more years after the last PPSV23 vaccine dose.    Pneumococcal polysaccharide (PPSV23) vaccine. When PCV13 is also indicated, PCV13 should be obtained first. All adults aged 66 years and older should be immunized. An adult younger than age 28 years who has certain medical conditions should be immunized. Any person who resides in a nursing home or long-term care facility should be immunized. An adult smoker should be immunized. People with an immunocompromised condition and certain other conditions should receive both PCV13 and PPSV23 vaccines. People with human immunodeficiency virus (HIV) infection should be immunized as soon as possible after diagnosis. Immunization during chemotherapy or radiation therapy should be avoided. Routine use of PPSV23 vaccine is not recommended for American Indians, [place] Natives, or people younger than 65 years unless there are medical conditions that require PPSV23 vaccine. When indicated, people who  have unknown immunization and have no record of  immunization should receive PPSV23 vaccine. One-time revaccination 5 years after the first dose of PPSV23 is recommended for people aged 19-64 years who have chronic kidney failure, nephrotic syndrome, asplenia, or immunocompromised conditions. People who received 1-2 doses of PPSV23 before age 43 years should receive another dose of PPSV23 vaccine at age 75 years or later if at least 5 years have passed since the previous dose. Doses of PPSV23 are not needed for people immunized with PPSV23 at or after age 75 years.  Preventive Services / Frequency   Ages 38 to 43 years  Blood pressure check.  Lipid and cholesterol check.  Lung cancer screening. / Every year if you are aged 57-80 years and have a 30-pack-year history of smoking and currently smoke or have quit within the past 15 years. Yearly screening is stopped once you have quit smoking for at least 15 years or develop a health problem that would prevent you from having lung cancer treatment.  Clinical breast exam.** / Every year after age 95 years.  BRCA-related cancer risk assessment.** / For women who have family members with a BRCA-related cancer (breast, ovarian, tubal, or peritoneal cancers).  Mammogram.** / Every year beginning at age 63 years and continuing for as long as you are in good health. Consult with your health care provider.  Pap test.** / Every 3 years starting at age 87 years through age 20 or 53 years with a history of 3 consecutive normal Pap tests.  HPV screening.** / Every 3 years from ages 84 years through ages 19 to 62 years with a history of 3 consecutive normal Pap tests.  Fecal occult blood test (FOBT) of stool. / Every year beginning at age 50 years and continuing until age 59 years. You may not need to do this test if you get a colonoscopy every 10 years.  Flexible sigmoidoscopy or colonoscopy.** / Every 5 years for a flexible sigmoidoscopy or every 10 years  for a colonoscopy beginning at age 73 years and continuing until age 77 years.  Hepatitis C blood test.** / For all people born from [birth year] through 1963 and any individual with known risks for hepatitis C.  Skin self-exam. / Monthly.  Influenza vaccine. / Every year.  Tetanus, diphtheria, and acellular pertussis (Tdap/Td) vaccine.** / Consult your health care provider. Pregnant women should receive 1 dose of Tdap vaccine during each pregnancy. 1 dose of Td every 10 years.  Varicella vaccine.** / Consult your health care provider. Pregnant females who do not have evidence of immunity should receive the first dose after pregnancy.  Zoster vaccine.** / 1 dose for adults aged 32 years or older.  Pneumococcal 13-valent conjugate (PCV13) vaccine.** / Consult your health care provider.  Pneumococcal polysaccharide (PPSV23) vaccine.** / 1 to 2 doses if you smoke cigarettes or if you have certain conditions.  Meningococcal vaccine.** / Consult your health care provider.  Hepatitis A vaccine.** / Consult your health care provider.  Hepatitis B vaccine.** / Consult your health care provider. Screening for abdominal aortic aneurysm (AAA)  by ultrasound is recommended for people over 50 who have history of high blood pressure or who are current or former smokers.

## 2015-04-16 NOTE — Progress Notes (Deleted)
   Subjective:    Patient ID: Taylor Li, female    DOB: [birth year], 65 y.o.   MRN: [id number]  HPI    Review of Systems     Objective:   Physical Exam        Assessment & Plan:

## 2015-04-16 NOTE — Progress Notes (Signed)
Patient ID: Taylor Li, female   DOB: [birth year], 65 y.o.   MRN: [id number]  Annual Screening/Preventative Visit And Comprehensive Evaluation &  Examination  This very nice 65 y.o. MWF presents for a Wellness/Preventative Visit & comprehensive evaluation and management of multiple medical co-morbidities.  Patient has been followed for HTN, T2_NIDDM  Prediabetes, Hyperlipidemia and Vitamin D Deficiency.    Patient has hx/o labile HTN predating since 2002. In 2004 , she had pAfib & had a  CV. In 2011 , she had a negative Cardiolite.  Patient's BP has been controlled at home and patient denies any cardiac symptoms as chest pain, palpitations, shortness of breath, dizziness or ankle swelling. Today's BP: 126/90 mmHg    Patient's hyperlipidemia is controlled with diet and medications. Patient denies myalgias or other medication SE's. Last lipids were near goal with Total Chol 221 , HDL 95, Trig 111 and sl elevated LDL 104 in Feb 2016.     Patient has prediabetes predating since 2011 with A1c 5.8%  and patient denies reactive hypoglycemic symptoms, visual blurring, diabetic polys, or paresthesias. Last A1c was still sl elevated 5.9% in Feb 2016.    Finally, patient has history of Vitamin D Deficiency of "51" in 2010 and last Vitamin D was 20 in Feb 2016.   Medication Sig  . ALPRAZolam  1 MG tablet TAKE 1/2-1 TABLET BY MOUTH 3 TIMES DAILY AS NEEDED  . VITAMIN C 1000 MG tablet Take 1,000 mg by mouth daily.  Marland Kitchen aspirin 81 MG tablet Take 81 mg by mouth daily.  Marland Kitchen buPROPion-XL 300 MG TAKE 1 TABLET BY MOUTH EVERY DAY  . citalopram  40 MG tablet TAKE 1 TABLET BY MOUTH EVERY DAY  . cyclobenzaprine 10 MG tablet TAKE 1/2 TO 1 TABLET BY MOUTH 3 TIMES A DAY AS NEEDED AS NEEDED FOR MUSCLE SPASM  . hctz 12.5 MG capsule TAKE 1 CAPSULE (12.5 MG TOTAL) BY MOUTH DAILY.((MAX PER INSURANCE IS 30 DAYS))  . hydrOXYzine  25 MG tablet Take 1-2 tabs QID PRN for itching.  . metoprolol succinate-XL 50 MG  TAKE 1 TABLET BY  MOUTH EVERY DAY *MAX 30 DAYS ON INSURANCE*  . Multiple Vitamin  Take 1 tablet by mouth daily.  . Omega-3 FISH OIL Take by mouth.  . ondansetron ODT 4 MG  4mg  ODT q4 hours prn nausea/vomit  . Vitamin D 50000 UNITS CAPS Take 1 capsule daily or as directed for severe Vitamin D Deficiency   No current facility-administered medications on file prior to visit.   No Known Allergies Past Medical History  Diagnosis Date  . Hyperlipidemia   . Hypertension   . Pre-diabetes   . Vitamin D deficiency   . Arthritis   . A-fib ([place])   . Personal history of colonic polyp- adenoma 09/22/2013   Health Maintenance  Topic Date Due  . TETANUS/TDAP  04/25/1971  . PAP SMEAR  04/24/1973  . MAMMOGRAM  04/16/2015  . INFLUENZA VACCINE  08/14/2015  . COLONOSCOPY  09/17/2018  . ZOSTAVAX  Completed  . Hepatitis C Screening  Completed  . HIV Screening  Completed   Immunization History  Administered Date(s) Administered  . DTaP 01/13/2001  . Influenza Whole 10/06/2012  . Influenza-Unspecified 11/11/2013, 11/15/2014  . PPD Test 01/14/2013, 04/17/2014  . Zoster 11/19/2009   Past Surgical History  Procedure Laterality Date  . Laminectomy  1982    L4-L5  . Breast surgery Bilateral 1983    SQ mastectomies  . Vaginal hysterectomy  1985  .  Tonsillectomy    . Nasal septum surgery    . Cardioversion    . Colonoscopy    . Appendectomy    . Cholecystectomy    . Cardiac defibrillator placement     Family History  Problem Relation Age of Onset  . Hypertension Mother   . Barrett's esophagus Mother   . Hypertension Father   . Lung cancer Father   . Thyroid disease Father   . Colon cancer Neg Hx    Social History  Substance Use Topics  . Smoking status: Former Smoker    Quit date: 04/18/2012  . Smokeless tobacco: Current User     Comment: E-Cigarretts  . Alcohol Use: 1.8 oz/week    3 Standard drinks or equivalent per week     Comment: social    ROS Constitutional: Denies fever, chills, weight  loss/gain, headaches, insomnia,  night sweats, and change in appetite. Does c/o fatigue. Eyes: Denies redness, blurred vision, diplopia, discharge, itchy, watery eyes.  ENT: Denies discharge, congestion, post nasal drip, epistaxis, sore throat, earache, hearing loss, dental pain, Tinnitus, Vertigo, Sinus pain, snoring.  Cardio: Denies chest pain, palpitations, irregular heartbeat, syncope, dyspnea, diaphoresis, orthopnea, PND, claudication, edema Respiratory: denies cough, dyspnea, DOE, pleurisy, hoarseness, laryngitis, wheezing.  Gastrointestinal: Denies dysphagia, heartburn, reflux, water brash, pain, cramps, nausea, vomiting, bloating, diarrhea, constipation, hematemesis, melena, hematochezia, jaundice, hemorrhoids Genitourinary: Denies dysuria, frequency, urgency, nocturia, hesitancy, discharge, hematuria, flank pain Breast: Breast lumps, nipple discharge, bleeding.  Musculoskeletal: Denies arthralgia, myalgia, stiffness, Jt. Swelling, pain, limp, and strain/sprain. Denies falls. Skin: Denies puritis, rash, hives, warts, acne, eczema, changing in skin lesion Neuro: No weakness, tremor, incoordination, spasms, paresthesia, pain Psychiatric: Denies confusion, memory loss, sensory loss. Denies Depression. Endocrine: Denies change in weight, skin, hair change, nocturia, and paresthesia, diabetic polys, visual blurring, hyper / hypo glycemic episodes.  Heme/Lymph: No excessive bleeding, bruising, enlarged lymph nodes.  Physical Exam  BP 126/90 mmHg  Pulse 68  Temp(Src) 97.5 F (36.4 C)  Resp 16  Ht 5\' 8"  (1.727 m)  Wt 200 lb 6.4 oz (90.901 kg)  BMI 30.48 kg/m2  General Appearance: Well nourished and in no apparent distress. Eyes: PERRLA, EOMs, conjunctiva no swelling or erythema, normal fundi and vessels. Sinuses: No frontal/maxillary tenderness ENT/Mouth: EACs patent / TMs  nl. Nares clear without erythema, swelling, mucoid exudates. Oral hygiene is good. No erythema, swelling, or  exudate. Tongue normal, non-obstructing. Tonsils not swollen or erythematous. Hearing normal.  Neck: Supple, thyroid normal. No bruits, nodes or JVD. Respiratory: Respiratory effort normal.  BS equal and clear bilateral without rales, rhonci, wheezing or stridor. Cardio: Heart sounds are normal with regular rate and rhythm and no murmurs, rubs or gallops. Peripheral pulses are normal and equal bilaterally without edema. No aortic or femoral bruits. Chest: symmetric with normal excursions and percussion. Breasts: Deferred Abdomen: Flat, soft, with bowel sounds. Nontender, no guarding, rebound, hernias, masses, or organomegaly.  Lymphatics: Non tender without lymphadenopathy.  Musculoskeletal: Full ROM all peripheral extremities, joint stability, 5/5 strength, and normal gait. Skin: Warm and dry without rashes, lesions, cyanosis, clubbing or  ecchymosis.  Neuro: Cranial nerves intact, reflexes equal bilaterally. Normal muscle tone, no cerebellar symptoms. Sensation intact.  Pysch: Alert and oriented X 3, normal affect, Insight and Judgment appropriate.   Assessment and Plan  1. Annual Preventative Screening Examination  1. Encounter for general adult medical examination with abnormal findings  - EKG 12-Lead - [place], RETROPERITNL ABD,  LTD - POC Hemoccult Bld/Stl (3-Cd Home Screen); Future -  Urinalysis, Routine w reflex microscopic (not at [facility]) - Vitamin B12 - Iron and TIBC - CBC with Differential/Platelet - Magnesium - Lipid panel - TSH - Hemoglobin A1c - Insulin, random - VITAMIN D 25 Hydroxy (Vit-D Deficiency, Fractures) - BASIC METABOLIC PANEL WITH GFR - Hepatic function panel - Microalbumin / creatinine urine ratio  2. Essential hypertension  - EKG 12-Lead - [place], RETROPERITNL ABD,  LTD - TSH  3. Hyperlipidemia  - Lipid panel - TSH  4. Prediabetes  - Hemoglobin A1c - Insulin, random  5. Vitamin D deficiency  - VITAMIN D 25 Hydroxy (Vit-D Deficiency,  Fractures)  6. Other fatigue  - Vitamin B12 - Iron and TIBC - CBC with Differential/Platelet - TSH  7. Screening for rectal cancer  - POC Hemoccult Bld/Stl (3-Cd Home Screen); Future  8. Screening examination for pulmonary tuberculosis  - PPD  9. Screening for AAA (aortic abdominal aneurysm)   10. Screening for ischemic heart disease   11. Medication management  - Urinalysis, Routine w reflex microscopic (not at [facility]) - CBC with Differential/Platelet - Magnesium - BASIC METABOLIC PANEL WITH GFR - Hepatic function panel - Microalbumin / creatinine urine ratio   Continue prudent diet as discussed, weight control, BP monitoring, regular exercise, and medications. Discussed med's effects and SE's. Screening labs and tests as requested with regular follow-up as recommended. Over 40 minutes of exam, counseling, chart review and high complex critical decision making was performed.

## 2015-04-17 LAB — URINALYSIS, ROUTINE W REFLEX MICROSCOPIC
BILIRUBIN URINE: NEGATIVE
Glucose, UA: NEGATIVE
Hgb urine dipstick: NEGATIVE
Ketones, ur: NEGATIVE
LEUKOCYTES UA: NEGATIVE
Nitrite: NEGATIVE
PROTEIN: NEGATIVE
SPECIFIC GRAVITY, URINE: 1.017 (ref 1.001–1.035)
pH: 5.5 (ref 5.0–8.0)

## 2015-04-17 LAB — VITAMIN D 25 HYDROXY (VIT D DEFICIENCY, FRACTURES): Vit D, 25-Hydroxy: 34 ng/mL (ref 30–100)

## 2015-04-17 LAB — MICROALBUMIN / CREATININE URINE RATIO
Creatinine, Urine: 64 mg/dL (ref 20–320)
Microalb Creat Ratio: 3 mcg/mg creat (ref <30)
Microalb, Ur: 0.2 mg/dL

## 2015-04-17 LAB — INSULIN, RANDOM: Insulin: 7.3 u[IU]/mL (ref 2.0–19.6)

## 2015-04-18 ENCOUNTER — Other Ambulatory Visit: Payer: Self-pay | Admitting: Internal Medicine

## 2015-05-16 ENCOUNTER — Other Ambulatory Visit: Payer: Self-pay | Admitting: Internal Medicine

## 2015-06-18 ENCOUNTER — Other Ambulatory Visit: Payer: Self-pay | Admitting: Physician Assistant

## 2015-06-18 DIAGNOSIS — F411 Generalized anxiety disorder: Secondary | ICD-10-CM

## 2015-07-23 ENCOUNTER — Other Ambulatory Visit: Payer: Self-pay

## 2015-07-23 MED ORDER — BUPROPION HCL ER (XL) 300 MG PO TB24: 300.0000 mg | ORAL_TABLET | Freq: Every day | ORAL | Status: DC

## 2015-07-23 MED ORDER — HYDROCHLOROTHIAZIDE 12.5 MG PO CAPS: ORAL_CAPSULE | ORAL | Status: DC

## 2015-07-24 ENCOUNTER — Other Ambulatory Visit: Payer: Self-pay | Admitting: *Deleted

## 2015-07-24 MED ORDER — HYDROCHLOROTHIAZIDE 12.5 MG PO CAPS: ORAL_CAPSULE | ORAL | Status: DC

## 2015-07-30 ENCOUNTER — Other Ambulatory Visit: Payer: Self-pay | Admitting: *Deleted

## 2015-07-30 MED ORDER — CITALOPRAM HYDROBROMIDE 40 MG PO TABS: ORAL_TABLET | ORAL | Status: DC

## 2015-10-12 ENCOUNTER — Other Ambulatory Visit: Payer: Self-pay | Admitting: Internal Medicine

## 2015-10-18 ENCOUNTER — Encounter: Payer: Self-pay | Admitting: Internal Medicine

## 2015-10-18 ENCOUNTER — Ambulatory Visit (INDEPENDENT_AMBULATORY_CARE_PROVIDER_SITE_OTHER): Payer: Commercial Managed Care - PPO | Admitting: Internal Medicine

## 2015-10-18 VITALS — BP 130/82 | HR 66 | Temp 98.4°F | Resp 16 | Ht 68.0 in | Wt 198.0 lb

## 2015-10-18 DIAGNOSIS — I10 Essential (primary) hypertension: Secondary | ICD-10-CM

## 2015-10-18 DIAGNOSIS — R7303 Prediabetes: Secondary | ICD-10-CM

## 2015-10-18 DIAGNOSIS — Z23 Encounter for immunization: Secondary | ICD-10-CM | POA: Diagnosis not present

## 2015-10-18 DIAGNOSIS — I482 Chronic atrial fibrillation, unspecified: Secondary | ICD-10-CM

## 2015-10-18 DIAGNOSIS — E782 Mixed hyperlipidemia: Secondary | ICD-10-CM

## 2015-10-18 DIAGNOSIS — Z79899 Other long term (current) drug therapy: Secondary | ICD-10-CM

## 2015-10-18 DIAGNOSIS — E559 Vitamin D deficiency, unspecified: Secondary | ICD-10-CM

## 2015-10-18 LAB — CBC WITH DIFFERENTIAL/PLATELET
BASOS ABS: 0 {cells}/uL (ref 0–200)
BASOS PCT: 0 %
EOS ABS: 150 {cells}/uL (ref 15–500)
Eosinophils Relative: 2 %
HEMATOCRIT: 41.4 % (ref 35.0–45.0)
Hemoglobin: 13.6 g/dL (ref 11.7–15.5)
Lymphocytes Relative: 34 %
Lymphs Abs: 2550 cells/uL (ref 850–3900)
MCH: 29.8 pg (ref 27.0–33.0)
MCHC: 32.9 g/dL (ref 32.0–36.0)
MCV: 90.8 fL (ref 80.0–100.0)
MONO ABS: 525 {cells}/uL (ref 200–950)
MONOS PCT: 7 %
MPV: 9.9 fL (ref 7.5–12.5)
NEUTROS ABS: 4275 {cells}/uL (ref 1500–7800)
Neutrophils Relative %: 57 %
Platelets: 267 10*3/uL (ref 140–400)
RBC: 4.56 MIL/uL (ref 3.80–5.10)
RDW: 13.5 % (ref 11.0–15.0)
WBC: 7.5 10*3/uL (ref 3.8–10.8)

## 2015-10-18 LAB — TSH: TSH: 1.35 m[IU]/L

## 2015-10-18 NOTE — Progress Notes (Signed)
Assessment and Plan:  Hypertension:  -Continue medication,  -monitor blood pressure at home.  -Continue DASH diet.   -Reminder to go to the ER if any CP, SOB, nausea, dizziness, severe HA, changes vision/speech, left arm numbness and tingling, and jaw pain.  Cholesterol: -Continue diet and exercise.  -Check cholesterol.   Pre-diabetes: -Continue diet and exercise.  -Check A1C  Vitamin D Def: -check level -continue medications.   Situational Depression -discussed seeing a therapist. -cont antidepressants -avoid excessive use of xanax  Chronic atrial fibrillation -not anticoagulated -rate regular in office today -rhythm regular in office today although no EKG performed  Vaccines updated.    Continue diet and meds as discussed. Further disposition pending results of labs.  HPI 65 y.o. female  presents for 3 month follow up with hypertension, hyperlipidemia, prediabetes and vitamin D.   Her blood pressure has been controlled at home, today their BP is BP: 130/82.   She does workout. She denies chest pain, shortness of breath, dizziness.  Patient reports that she has not had any further issues with palpitations for her chronic atrial fibrillation.    She is on cholesterol medication and denies myalgias. Her cholesterol is at goal. The cholesterol last visit was:   Lab Results  Component Value Date   CHOL 185 04/16/2015   HDL 74 04/16/2015   LDLCALC 89 04/16/2015   TRIG 110 04/16/2015   CHOLHDL 2.5 04/16/2015     She has been working on diet and exercise for prediabetes, and denies foot ulcerations, hyperglycemia, hypoglycemia , increased appetite, nausea, paresthesia of the feet, polydipsia, polyuria, visual disturbances, vomiting and weight loss. Last A1C in the office was:  Lab Results  Component Value Date   HGBA1C 5.9 (H) 04/16/2015    Patient is on Vitamin D supplement.  Lab Results  Component Value Date   VD25OH 34 04/16/2015     She reports that she has  been struggling with her mother's care.  She reports that she fell and broke her hip and she cannot have it repaired because of her health.   She also reports that her daughter is planning on moving away.    Current Medications:  Current Outpatient Prescriptions on File Prior to Visit  Medication Sig Dispense Refill  . ALPRAZolam (XANAX) 1 MG tablet TAKE ONE HALF OR ONE TABLET 3 TIMES A DAY AS NEEDED 90 tablet 5  . Ascorbic Acid (VITAMIN C) 1000 MG tablet Take 1,000 mg by mouth daily.    Marland Kitchen aspirin 81 MG tablet Take 81 mg by mouth daily.    Marland Kitchen buPROPion (WELLBUTRIN XL) 300 MG 24 hr tablet Take 1 tablet (300 mg total) by mouth daily. 30 tablet 3  . citalopram (CELEXA) 40 MG tablet TAKE 1 TABLET BY MOUTH EVERY DAY**MAX 30 DAYS PER INS** 30 tablet 2  . cyclobenzaprine (FLEXERIL) 10 MG tablet TAKE 1/2 TO 1 TABLET BY MOUTH 3 TIMES A DAY AS NEEDED AS NEEDED FOR MUSCLE SPASM 90 tablet 3  . Glucosamine HCl (GLUCOSAMINE PO) Take by mouth daily.    . hydrochlorothiazide (MICROZIDE) 12.5 MG capsule TAKE 1 CAPSULE (12.5 MG TOTAL) BY MOUTH DAILY.((MAX PER INSURANCE IS 30 DAYS)) 30 capsule 3  . hydrOXYzine (ATARAX/VISTARIL) 25 MG tablet Take 1-2 tabs QID PRN for itching. 100 tablet 0  . metoprolol succinate (TOPROL-XL) 50 MG 24 hr tablet TAKE 1 TABLET BY MOUTH EVERY DAY *MAX 30 DAYS ON INSURANCE* 90 tablet 1  . Multiple Vitamin (MULTIVITAMIN) tablet Take 1 tablet by  mouth daily.    . Omega-3 Fatty Acids (FISH OIL PO) Take by mouth.    . ondansetron (ZOFRAN ODT) 4 MG disintegrating tablet 4mg  ODT q4 hours prn nausea/vomit 20 tablet 0  . Vitamin D, Ergocalciferol, (DRISDOL) 50000 UNITS CAPS capsule Take 1 capsule daily or as directed for severe Vitamin D Deficiency 30 capsule 99   No current facility-administered medications on file prior to visit.     Medical History:  Past Medical History:  Diagnosis Date  . A-fib ([place])   . Arthritis   . Hyperlipidemia   . Hypertension   . Personal history of colonic  polyp- adenoma 09/22/2013  . Pre-diabetes   . Vitamin D deficiency     Allergies: No Known Allergies   Review of Systems:  Review of Systems  Constitutional: Negative for chills, fever and malaise/fatigue.  HENT: Negative for congestion, ear pain and sore throat.   Eyes: Negative.   Respiratory: Negative for cough, shortness of breath and wheezing.   Cardiovascular: Negative for chest pain, palpitations and leg swelling.  Gastrointestinal: Negative for abdominal pain, blood in stool, constipation, diarrhea, heartburn and melena.  Genitourinary: Negative.   Skin: Negative.   Neurological: Negative for dizziness, sensory change, loss of consciousness and headaches.  Psychiatric/Behavioral: Positive for depression. The patient is not nervous/anxious and does not have insomnia.     Family history- Review and unchanged  Social history- Review and unchanged  Physical Exam: BP 130/82   Pulse 66   Temp 98.4 F (36.9 C) (Temporal)   Resp 16   Ht 5\' 8"  (1.727 m)   Wt 198 lb (89.8 kg)   BMI 30.11 kg/m  Wt Readings from Last 3 Encounters:  10/18/15 198 lb (89.8 kg)  04/16/15 200 lb 6.4 oz (90.9 kg)  03/27/15 200 lb 6.4 oz (90.9 kg)    General Appearance: Well nourished well developed, in no apparent distress. Eyes: PERRLA, EOMs, conjunctiva no swelling or erythema ENT/Mouth: Ear canals normal without obstruction, swelling, erythma, discharge.  TMs normal bilaterally.  Oropharynx moist, clear, without exudate, or postoropharyngeal swelling. Neck: Supple, thyroid normal,no cervical adenopathy  Respiratory: Respiratory effort normal, Breath sounds clear A&P without rhonchi, wheeze, or rale.  No retractions, no accessory usage. Cardio: RRR with no MRGs. Brisk peripheral pulses without edema.  Abdomen: Soft, + BS,  Non tender, no guarding, rebound, hernias, masses. Musculoskeletal: Full ROM, 5/5 strength, Normal gait Skin: Warm, dry without rashes, lesions, ecchymosis.  Neuro: Awake  and oriented X 3, Cranial nerves intact. Normal muscle tone, no cerebellar symptoms. Psych: Normal affect, Insight and Judgment appropriate.    Starlyn Skeans, PA-C 3:54 PM [facility]

## 2015-10-18 NOTE — Patient Instructions (Signed)
Counseling services  Dr. Edward Lurey, Ph.D. [address] [place] [address] Phone: [phone]  Catharine Dowda, M.Ed [phone] [address], [place] [address]   [facility] Hours: Monday-Thursday 830-8pm  Friday 830AM-7PM Address: [address] [phone]  [facility].  Address: [address]     [place], [place] [address] Phone-[phone]  [facility] [phone] office [email or web address] [address], [place], [place] [address]  Laura Atkinson, therapist  Erik Nelson, MA, clinical psychologist  Cognitive-Behavior Therapy; Mood Disorders; Anxiety Disorders; adult and child ADHD; Family Therapy; Stress Management; personal growth, and Marital Therapy.    Dennis McKnight Ph.D., clinical psychologist Cognitive-Behavior Therapy; Mood Disorders; Anxiety Disorders; Stress     Management  [facility] [address], [place], [place] [address] [phone]   [facility] Desiree Wilkinson, psychotherapist [address] [place], [place] [address] [phone]  Elaine Talbert Ph.D., clinical psychologist [phone] office [address] ,  [address] Cognitive Behavior Therapy, Depression, Bipolar, Anxiety, Grief and Loss       

## 2015-10-19 LAB — HEPATIC FUNCTION PANEL
ALBUMIN: 4 g/dL (ref 3.6–5.1)
ALK PHOS: 64 U/L (ref 33–130)
ALT: 19 U/L (ref 6–29)
AST: 15 U/L (ref 10–35)
Bilirubin, Direct: 0.1 mg/dL (ref <=0.2)
Indirect Bilirubin: 0.3 mg/dL (ref 0.2–1.2)
TOTAL PROTEIN: 6.3 g/dL (ref 6.1–8.1)
Total Bilirubin: 0.4 mg/dL (ref 0.2–1.2)

## 2015-10-19 LAB — BASIC METABOLIC PANEL WITH GFR
BUN: 18 mg/dL (ref 7–25)
CHLORIDE: 104 mmol/L (ref 98–110)
CO2: 25 mmol/L (ref 20–31)
CREATININE: 0.95 mg/dL (ref 0.50–0.99)
Calcium: 9.1 mg/dL (ref 8.6–10.4)
GFR, Est African American: 74 mL/min (ref >=60)
GFR, Est Non African American: 64 mL/min (ref >=60)
GLUCOSE: 94 mg/dL (ref 65–99)
POTASSIUM: 3.8 mmol/L (ref 3.5–5.3)
Sodium: 138 mmol/L (ref 135–146)

## 2015-10-19 LAB — LIPID PANEL
CHOL/HDL RATIO: 2.6 ratio (ref <=5.0)
CHOLESTEROL: 191 mg/dL (ref 125–200)
HDL: 73 mg/dL (ref >=46)
LDL Cholesterol: 94 mg/dL (ref <130)
Triglycerides: 121 mg/dL (ref <150)
VLDL: 24 mg/dL (ref <30)

## 2015-10-19 LAB — HEMOGLOBIN A1C
HEMOGLOBIN A1C: 5.5 % (ref <5.7)
MEAN PLASMA GLUCOSE: 111 mg/dL

## 2015-11-02 ENCOUNTER — Other Ambulatory Visit: Payer: Self-pay | Admitting: Internal Medicine

## 2015-11-18 ENCOUNTER — Other Ambulatory Visit: Payer: Self-pay | Admitting: Internal Medicine

## 2015-12-15 ENCOUNTER — Encounter: Payer: Self-pay | Admitting: *Deleted

## 2016-01-05 ENCOUNTER — Other Ambulatory Visit: Payer: Self-pay | Admitting: Internal Medicine

## 2016-02-05 ENCOUNTER — Other Ambulatory Visit: Payer: Self-pay | Admitting: Internal Medicine

## 2016-02-25 ENCOUNTER — Ambulatory Visit (INDEPENDENT_AMBULATORY_CARE_PROVIDER_SITE_OTHER): Payer: Commercial Managed Care - PPO | Admitting: Internal Medicine

## 2016-02-25 ENCOUNTER — Encounter: Payer: Self-pay | Admitting: Internal Medicine

## 2016-02-25 VITALS — BP 110/72 | HR 84 | Temp 98.0°F | Resp 18 | Ht 68.0 in | Wt 195.0 lb

## 2016-02-25 DIAGNOSIS — F418 Other specified anxiety disorders: Secondary | ICD-10-CM

## 2016-02-25 DIAGNOSIS — I10 Essential (primary) hypertension: Secondary | ICD-10-CM

## 2016-02-25 DIAGNOSIS — E559 Vitamin D deficiency, unspecified: Secondary | ICD-10-CM | POA: Diagnosis not present

## 2016-02-25 DIAGNOSIS — E782 Mixed hyperlipidemia: Secondary | ICD-10-CM | POA: Diagnosis not present

## 2016-02-25 DIAGNOSIS — R1031 Right lower quadrant pain: Secondary | ICD-10-CM

## 2016-02-25 DIAGNOSIS — F329 Major depressive disorder, single episode, unspecified: Secondary | ICD-10-CM

## 2016-02-25 DIAGNOSIS — Z79899 Other long term (current) drug therapy: Secondary | ICD-10-CM | POA: Diagnosis not present

## 2016-02-25 DIAGNOSIS — F32A Depression, unspecified: Secondary | ICD-10-CM

## 2016-02-25 DIAGNOSIS — R7303 Prediabetes: Secondary | ICD-10-CM

## 2016-02-25 DIAGNOSIS — F419 Anxiety disorder, unspecified: Secondary | ICD-10-CM

## 2016-02-25 LAB — BASIC METABOLIC PANEL WITH GFR
BUN: 11 mg/dL (ref 7–25)
CALCIUM: 9.1 mg/dL (ref 8.6–10.4)
CO2: 27 mmol/L (ref 20–31)
Chloride: 105 mmol/L (ref 98–110)
Creat: 0.8 mg/dL (ref 0.50–0.99)
GFR, EST NON AFRICAN AMERICAN: 79 mL/min (ref >=60)
Glucose, Bld: 99 mg/dL (ref 65–99)
POTASSIUM: 3.8 mmol/L (ref 3.5–5.3)
SODIUM: 141 mmol/L (ref 135–146)

## 2016-02-25 LAB — HEPATIC FUNCTION PANEL
ALBUMIN: 3.9 g/dL (ref 3.6–5.1)
ALT: 60 U/L — AB (ref 6–29)
AST: 26 U/L (ref 10–35)
Alkaline Phosphatase: 64 U/L (ref 33–130)
Bilirubin, Direct: 0.1 mg/dL (ref <=0.2)
Indirect Bilirubin: 0.5 mg/dL (ref 0.2–1.2)
Total Bilirubin: 0.6 mg/dL (ref 0.2–1.2)
Total Protein: 6.4 g/dL (ref 6.1–8.1)

## 2016-02-25 LAB — CBC WITH DIFFERENTIAL/PLATELET
Basophils Absolute: 60 cells/uL (ref 0–200)
Basophils Relative: 1 %
EOS PCT: 2 %
Eosinophils Absolute: 120 cells/uL (ref 15–500)
HCT: 42.8 % (ref 35.0–45.0)
Hemoglobin: 14.3 g/dL (ref 11.7–15.5)
LYMPHS PCT: 40 %
Lymphs Abs: 2400 cells/uL (ref 850–3900)
MCH: 29.7 pg (ref 27.0–33.0)
MCHC: 33.4 g/dL (ref 32.0–36.0)
MCV: 88.8 fL (ref 80.0–100.0)
MONO ABS: 600 {cells}/uL (ref 200–950)
MPV: 10 fL (ref 7.5–12.5)
Monocytes Relative: 10 %
NEUTROS PCT: 47 %
Neutro Abs: 2820 cells/uL (ref 1500–7800)
PLATELETS: 284 10*3/uL (ref 140–400)
RBC: 4.82 MIL/uL (ref 3.80–5.10)
RDW: 13.2 % (ref 11.0–15.0)
WBC: 6 10*3/uL (ref 3.8–10.8)

## 2016-02-25 MED ORDER — HYOSCYAMINE SULFATE SL 0.125 MG SL SUBL
1.0000 | SUBLINGUAL_TABLET | Freq: Three times a day (TID) | SUBLINGUAL | 0 refills | Status: DC
Start: 2016-02-25 — End: 2016-09-04

## 2016-02-25 MED ORDER — AMOXICILLIN-POT CLAVULANATE 875-125 MG PO TABS: 1.0000 | ORAL_TABLET | Freq: Two times a day (BID) | ORAL | 0 refills | Status: DC

## 2016-02-25 NOTE — Patient Instructions (Signed)

## 2016-02-25 NOTE — Progress Notes (Signed)
Assessment and Plan:  Hypertension:  -may be slightly dehydrated given N/V/D have instructed to push fluids.  -Continue medication,  -monitor blood pressure at home.  -Continue DASH diet.   -Reminder to go to the ER if any CP, SOB, nausea, dizziness, severe HA, changes vision/speech, left arm numbness and tingling, and jaw pain.  Cholesterol: -Continue diet and exercise.  -Check cholesterol.   Pre-diabetes: -Continue diet and exercise.   Vitamin D Def: -continue medications.   Abdominal pain with N/V/D -possible norovirus -hyoscamine -push fluids -brat diet -does appear mildly dehydrated -was seen in ER and labs okay will recheck today -needs UA and culture as was not done in ER -if significant bump in WBCs will get CT abdomen and pelvis   Cough and sinus congestion -flonase -mucinex -augmentin for possible sinus infection but would also cover for diverticulitis as well Continue diet and meds as discussed. Further disposition pending results of labs.  HPI 66 y.o. female  presents for 3 month follow up with hypertension, hyperlipidemia, prediabetes and vitamin D.   Her blood pressure has been controlled at home, today their BP is BP: 110/72.   She does not workout. She denies chest pain, shortness of breath, dizziness.   She is on cholesterol medication and denies myalgias. Her cholesterol is at goal. The cholesterol last visit was:   Lab Results  Component Value Date   CHOL 191 10/18/2015   HDL 73 10/18/2015   LDLCALC 94 10/18/2015   TRIG 121 10/18/2015   CHOLHDL 2.6 10/18/2015     She has not been working on diet and exercise for prediabetes, and denies foot ulcerations, hyperglycemia, hypoglycemia , increased appetite, nausea, paresthesia of the feet, polydipsia, polyuria, visual disturbances, vomiting and weight loss. Last A1C in the office was:  Lab Results  Component Value Date   HGBA1C 5.5 10/18/2015    Patient is on Vitamin D supplement.  Lab Results   Component Value Date   VD25OH 34 04/16/2015      She reports that she recently has been having severe nausea, vomiting, and diarrhea.  She had to go to the ER for fluids at a [place] facility.  She reports that she was having some pain when she was in the ER and was given some pain medication. She reports that she was given phenergan and 2L of fluids.  She reports that this has been gong on since the 8th.  She feels like she is really dizzy.  She has been continuing to get diarrhea.  She is having multiple stools per day.  She is under a significant amount of stress right now as her mother is in the hospital with MRSA HCAP and RSV.  She is due to have a thoracentesis and a TEE.  She is worried about her and given her own recent illness has not been able to see her.  She was recommended to get set up with a counselor at her last visit and she has not been able to do that yet.    Current Medications:  Current Outpatient Prescriptions on File Prior to Visit  Medication Sig Dispense Refill  . ALPRAZolam (XANAX) 1 MG tablet TAKE ONE HALF OR ONE TABLET 3 TIMES A DAY AS NEEDED 90 tablet 5  . Ascorbic Acid (VITAMIN C) 1000 MG tablet Take 1,000 mg by mouth daily.    Marland Kitchen aspirin 81 MG tablet Take 81 mg by mouth daily.    Marland Kitchen buPROPion (WELLBUTRIN XL) 300 MG 24 hr tablet TAKE  1 TABLET (300 MG TOTAL) BY MOUTH DAILY. 90 tablet 1  . citalopram (CELEXA) 40 MG tablet TAKE 1 TABLET BY MOUTH EVERY DAY**MAX 30 DAYS PER INS** 90 tablet 1  . cyclobenzaprine (FLEXERIL) 10 MG tablet TAKE 1/2 TO 1 TABLET BY MOUTH 3 TIMES A DAY AS NEEDED AS NEEDED FOR MUSCLE SPASM 90 tablet 3  . Glucosamine HCl (GLUCOSAMINE PO) Take by mouth daily.    . hydrochlorothiazide (MICROZIDE) 12.5 MG capsule TAKE 1 CAPSULE (12.5 MG TOTAL) BY MOUTH DAILY.((MAX PER INSURANCE IS 30 DAYS)) 90 capsule 1  . hydrOXYzine (ATARAX/VISTARIL) 25 MG tablet Take 1-2 tabs QID PRN for itching. 100 tablet 0  . metoprolol succinate (TOPROL-XL) 50 MG 24 hr tablet  TAKE 1 TABLET BY MOUTH EVERY DAY *MAX 30 DAYS ON INSURANCE* 90 tablet 1  . Multiple Vitamin (MULTIVITAMIN) tablet Take 1 tablet by mouth daily.    . Omega-3 Fatty Acids (FISH OIL PO) Take by mouth.    . ondansetron (ZOFRAN ODT) 4 MG disintegrating tablet 4mg  ODT q4 hours prn nausea/vomit 20 tablet 0  . Vitamin D, Ergocalciferol, (DRISDOL) 50000 UNITS CAPS capsule Take 1 capsule daily or as directed for severe Vitamin D Deficiency 30 capsule 99   No current facility-administered medications on file prior to visit.     Medical History:  Past Medical History:  Diagnosis Date  . A-fib ([place])   . Arthritis   . Hyperlipidemia   . Hypertension   . Personal history of colonic polyp- adenoma 09/22/2013  . Pre-diabetes   . Vitamin D deficiency     Allergies: No Known Allergies   Review of Systems:  Review of Systems  Constitutional: Positive for malaise/fatigue. Negative for chills and fever.  HENT: Positive for congestion and ear pain. Negative for sore throat.   Eyes: Negative.   Respiratory: Positive for cough. Negative for shortness of breath and wheezing.   Cardiovascular: Negative for chest pain, palpitations and leg swelling.  Gastrointestinal: Positive for abdominal pain, diarrhea, nausea and vomiting. Negative for blood in stool, constipation, heartburn and melena.  Genitourinary: Negative.   Skin: Negative.   Neurological: Positive for dizziness and weakness. Negative for sensory change, loss of consciousness and headaches.  Psychiatric/Behavioral: Positive for depression. The patient is nervous/anxious. The patient does not have insomnia.     Family history- Review and unchanged  Social history- Review and unchanged  Physical Exam: BP 110/72   Pulse 84   Temp 98 F (36.7 C) (Temporal)   Resp 18   Ht 5\' 8"  (1.727 m)   Wt 195 lb (88.5 kg)   BMI 29.65 kg/m  Wt Readings from Last 3 Encounters:  02/25/16 195 lb (88.5 kg)  10/18/15 198 lb (89.8 kg)  04/16/15 200 lb 6.4  oz (90.9 kg)    General Appearance: Ill appearing and crying Eyes: PERRLA, EOMs, conjunctiva no swelling or erythema ENT/Mouth: Ear canals normal without obstruction, swelling, erythma, discharge.  TMs normal bilaterally.  Oropharynx mildly dry, clear, without exudate, or postoropharyngeal swelling. Neck: Supple, thyroid normal,no cervical adenopathy  Respiratory: Respiratory effort normal, Breath sounds clear A&P without rhonchi, wheeze, or rale.  No retractions, no accessory usage. Cardio: RRR with no MRGs. Brisk peripheral pulses without edema.  Abdomen: Soft, + BS,  Non tender, no guarding, rebound, hernias, masses. Musculoskeletal: Full ROM, 5/5 strength, Normal gait Skin: Warm, dry without rashes, lesions, ecchymosis.  Neuro: Awake and oriented X 3, Cranial nerves intact. Normal muscle tone, no cerebellar symptoms. Psych: Emotionally labile, crying and  teary during most of visit, Insight and Judgment appropriate.    Starlyn Skeans, PA-C 11:38 AM [facility]

## 2016-02-26 LAB — URINALYSIS, ROUTINE W REFLEX MICROSCOPIC
BILIRUBIN URINE: NEGATIVE
GLUCOSE, UA: NEGATIVE
HGB URINE DIPSTICK: NEGATIVE
KETONES UR: NEGATIVE
Leukocytes, UA: NEGATIVE
Nitrite: NEGATIVE
PH: 6 (ref 5.0–8.0)
Protein, ur: NEGATIVE
Specific Gravity, Urine: 1.013 (ref 1.001–1.035)

## 2016-02-26 LAB — LIPASE: LIPASE: 42 U/L (ref 7–60)

## 2016-02-26 LAB — AMYLASE: Amylase: 35 U/L (ref 0–105)

## 2016-02-26 LAB — URINE CULTURE: ORGANISM ID, BACTERIA: NO GROWTH

## 2016-02-27 ENCOUNTER — Encounter: Payer: Self-pay | Admitting: Internal Medicine

## 2016-03-03 ENCOUNTER — Ambulatory Visit: Payer: Self-pay | Admitting: Internal Medicine

## 2016-03-03 ENCOUNTER — Other Ambulatory Visit: Payer: Self-pay | Admitting: Internal Medicine

## 2016-03-03 ENCOUNTER — Ambulatory Visit (INDEPENDENT_AMBULATORY_CARE_PROVIDER_SITE_OTHER): Payer: Commercial Managed Care - PPO | Admitting: Internal Medicine

## 2016-03-03 ENCOUNTER — Encounter: Payer: Self-pay | Admitting: Internal Medicine

## 2016-03-03 VITALS — BP 142/88 | HR 62 | Temp 97.8°F | Resp 18 | Ht 68.0 in

## 2016-03-03 DIAGNOSIS — K521 Toxic gastroenteritis and colitis: Secondary | ICD-10-CM | POA: Diagnosis not present

## 2016-03-03 DIAGNOSIS — F4320 Adjustment disorder, unspecified: Secondary | ICD-10-CM

## 2016-03-03 DIAGNOSIS — J069 Acute upper respiratory infection, unspecified: Secondary | ICD-10-CM | POA: Diagnosis not present

## 2016-03-03 DIAGNOSIS — F4321 Adjustment disorder with depressed mood: Secondary | ICD-10-CM

## 2016-03-03 DIAGNOSIS — F411 Generalized anxiety disorder: Secondary | ICD-10-CM

## 2016-03-03 DIAGNOSIS — T3695XA Adverse effect of unspecified systemic antibiotic, initial encounter: Secondary | ICD-10-CM

## 2016-03-03 MED ORDER — DEXAMETHASONE SODIUM PHOSPHATE 100 MG/10ML IJ SOLN
10.0000 mg | Freq: Once | INTRAMUSCULAR | Status: DC
Start: 2016-03-03 — End: 2016-03-03

## 2016-03-03 MED ORDER — METOCLOPRAMIDE HCL 5 MG PO TABS: 5.0000 mg | ORAL_TABLET | Freq: Three times a day (TID) | ORAL | 1 refills | Status: DC

## 2016-03-03 MED ORDER — AZELASTINE HCL 0.1 % NA SOLN: 2.0000 | Freq: Two times a day (BID) | NASAL | 2 refills | Status: DC

## 2016-03-03 MED ORDER — DEXAMETHASONE SODIUM PHOSPHATE 10 MG/ML IJ SOLN
10.0000 mg | Freq: Once | INTRAMUSCULAR | Status: AC
  Administered 2016-03-03: 10 mg via INTRAMUSCULAR

## 2016-03-03 NOTE — Patient Instructions (Signed)
Please take the allegra once daily.  Please take flonase 2 sprays per nostril in each nostril.  Please take astelin 2 sprays per nostril in the morning and the evening.  Please take reglan up to 3 times per day as needed for headache or nausea.   Please let me know if you continue to have diarrhea.

## 2016-03-03 NOTE — Progress Notes (Signed)
Assessment and Plan:   1. Grief -headaches and vomiting may be secondary to panic attack associated with grief -patient already has prescription for xanax and was encouraged to take this as needed for panic attacks.   -reglan as needed for nausea and headaches related to crying -recommended not taking medications on empty stomach   2. Antibiotic-associated diarrhea -suspect that some lingering diarrhea associated with augmentin.  Script was finished today -if diarrhea does not improve can get stool testing done for C. Diff  3. Acute URI -does appear to be improving -no further need for abx -will see if we can get rest of the symptoms to resolve with steroid and symptomatic treatment - azelastine (ASTELIN) 0.1 % nasal spray; Place 2 sprays into both nostrils 2 (two) times daily. Use in each nostril as directed  Dispense: 30 mL; Refill: 2 - dexamethasone (DECADRON) injection 10 mg; Inject 1 mL (10 mg total) into the muscle once.     HPI 66 y.o.female presents for follow-up of visit  02/25/16.  She reports that she was having some improvement of her nausea and vomiting.  She spent a lot of the time in bed when she was home.  She reports that she found out that her mother passed at the hospital.  She reports that her mother chose to be a DNR.  She reports that she got really upset on the way to the hospital.  She vomited both on the way to the hospital and also at the hospital.  She reports that she is still coughing and it is breaking up some more.  She finished the augmentin.  She developed headache on Sunday and also vomited last night.  She took tylenol on an empty stomach and then vomited.  She reports that she took her last augmentin this morning.  She reports that she has continued to have runny stools.  She feels that she didn't need to be evaluated today, but her husband was really worried about her.    Past Medical History:  Diagnosis Date  . A-fib ([place])   . Arthritis   .  Hyperlipidemia   . Hypertension   . Personal history of colonic polyp- adenoma 09/22/2013  . Pre-diabetes   . Vitamin D deficiency      No Known Allergies    Current Outpatient Prescriptions on File Prior to Visit  Medication Sig Dispense Refill  . ALPRAZolam (XANAX) 1 MG tablet TAKE ONE HALF OR ONE TABLET 3 TIMES A DAY AS NEEDED 90 tablet 5  . Ascorbic Acid (VITAMIN C) 1000 MG tablet Take 1,000 mg by mouth daily.    Marland Kitchen aspirin 81 MG tablet Take 81 mg by mouth daily.    Marland Kitchen buPROPion (WELLBUTRIN XL) 300 MG 24 hr tablet TAKE 1 TABLET (300 MG TOTAL) BY MOUTH DAILY. 90 tablet 1  . citalopram (CELEXA) 40 MG tablet TAKE 1 TABLET BY MOUTH EVERY DAY**MAX 30 DAYS PER INS** 90 tablet 1  . cyclobenzaprine (FLEXERIL) 10 MG tablet TAKE 1/2 TO 1 TABLET BY MOUTH 3 TIMES A DAY AS NEEDED AS NEEDED FOR MUSCLE SPASM 90 tablet 3  . Glucosamine HCl (GLUCOSAMINE PO) Take by mouth daily.    . hydrochlorothiazide (MICROZIDE) 12.5 MG capsule TAKE 1 CAPSULE (12.5 MG TOTAL) BY MOUTH DAILY.((MAX PER INSURANCE IS 30 DAYS)) 90 capsule 1  . hydrOXYzine (ATARAX/VISTARIL) 25 MG tablet Take 1-2 tabs QID PRN for itching. 100 tablet 0  . Hyoscyamine Sulfate SL (LEVSIN/SL) 0.125 MG SUBL Place 1 tablet under  the tongue 3 (three) times daily. 120 each 0  . metoprolol succinate (TOPROL-XL) 50 MG 24 hr tablet TAKE 1 TABLET BY MOUTH EVERY DAY *MAX 30 DAYS ON INSURANCE* 90 tablet 1  . Multiple Vitamin (MULTIVITAMIN) tablet Take 1 tablet by mouth daily.    . Omega-3 Fatty Acids (FISH OIL PO) Take by mouth.    . ondansetron (ZOFRAN ODT) 4 MG disintegrating tablet 4mg  ODT q4 hours prn nausea/vomit 20 tablet 0  . Vitamin D, Ergocalciferol, (DRISDOL) 50000 UNITS CAPS capsule Take 1 capsule daily or as directed for severe Vitamin D Deficiency 30 capsule 99   No current facility-administered medications on file prior to visit.     Review of Systems  Constitutional: Negative for chills, fever and malaise/fatigue.  HENT: Negative for  congestion, ear pain, hearing loss and sore throat.   Respiratory: Positive for cough. Negative for shortness of breath and wheezing.   Cardiovascular: Negative for chest pain, palpitations and leg swelling.  Gastrointestinal: Positive for diarrhea, nausea and vomiting. Negative for abdominal pain, blood in stool, constipation, heartburn and melena.  Genitourinary: Negative.   Psychiatric/Behavioral: Positive for depression. The patient is nervous/anxious. The patient does not have insomnia.   '  Physical Exam: There were no vitals filed for this visit. Resp 18   Ht 5\' 8"  (1.727 m)  General Appearance: Well developed well nourished, non-toxic appearing in no apparent distress. Eyes: PERRLA, EOMs, conjunctiva w/ no swelling or erythema or discharge Sinuses: No Frontal/maxillary tenderness ENT/Mouth: Ear canals clear without swelling or erythema.  TM's normal bilaterally with no retractions, bulging, or loss of landmarks.   Neck: Supple, thyroid normal, no notable JVD  Respiratory: Respiratory effort normal, Clear breath sounds anteriorly and posteriorly bilaterally without rales, rhonchi, wheezing or stridor. No retractions or accessory muscle usage. Cardio: RRR with no MRGs.   Abdomen: Soft, + BS.  Non tender, no guarding, rebound, hernias, masses.  Musculoskeletal: Full ROM, 5/5 strength, normal gait.  Skin: Warm, dry without rashes  Neuro: Awake and oriented X 3, Cranial nerves intact. Normal muscle tone, no cerebellar symptoms. Sensation intact.  Psych: normal affect, Insight and Judgment appropriate.     Starlyn Skeans, PA-C 4:11 PM [facility]

## 2016-03-04 NOTE — Telephone Encounter (Signed)
Please call Alprazolam 

## 2016-03-13 ENCOUNTER — Other Ambulatory Visit: Payer: Self-pay | Admitting: Internal Medicine

## 2016-04-17 ENCOUNTER — Ambulatory Visit: Payer: Self-pay | Admitting: Internal Medicine

## 2016-04-17 ENCOUNTER — Ambulatory Visit (INDEPENDENT_AMBULATORY_CARE_PROVIDER_SITE_OTHER): Payer: Commercial Managed Care - PPO | Admitting: Internal Medicine

## 2016-04-17 VITALS — BP 128/72 | HR 92 | Temp 97.5°F | Wt 199.0 lb

## 2016-04-17 DIAGNOSIS — J069 Acute upper respiratory infection, unspecified: Secondary | ICD-10-CM | POA: Diagnosis not present

## 2016-04-17 MED ORDER — FLUTICASONE PROPIONATE 50 MCG/ACT NA SUSP: 2.0000 | Freq: Every day | NASAL | 0 refills | Status: DC

## 2016-04-17 MED ORDER — AZITHROMYCIN 250 MG PO TABS: ORAL_TABLET | ORAL | 0 refills | Status: DC

## 2016-04-17 MED ORDER — GLYCOPYRROLATE-FORMOTEROL 9-4.8 MCG/ACT IN AERO: 2.0000 | INHALATION_SPRAY | Freq: Two times a day (BID) | RESPIRATORY_TRACT | 0 refills | Status: DC

## 2016-04-17 MED ORDER — ALBUTEROL SULFATE HFA 108 (90 BASE) MCG/ACT IN AERS: 1.0000 | INHALATION_SPRAY | Freq: Four times a day (QID) | RESPIRATORY_TRACT | 0 refills | Status: DC | PRN

## 2016-04-17 MED ORDER — PREDNISONE 20 MG PO TABS: ORAL_TABLET | ORAL | 0 refills | Status: DC

## 2016-04-17 MED ORDER — PROMETHAZINE-DM 6.25-15 MG/5ML PO SYRP: ORAL_SOLUTION | ORAL | 1 refills | Status: DC

## 2016-04-17 NOTE — Progress Notes (Signed)
HPI  Patient presents to the office for evaluation of cough.  It has been going on for 1 weeks.  Patient reports night > day, wet, barky, worse with lying down.  They also endorse change in voice, chills, fever, postnasal drip, shortness of breath, sputum production, wheezing and yellow green sputum production, chest tightness, headaches, rhinorrhea, sore thorat, right ear pain and congestion.  .  They have tried allegra, flonase, and astelin.  They report that nothing has worked.  They admits to other sick contacts.  Review of Systems  Constitutional: Positive for malaise/fatigue. Negative for chills and fever.  HENT: Positive for congestion, ear pain, hearing loss and sore throat.   Respiratory: Positive for cough. Negative for sputum production, shortness of breath and wheezing.   Cardiovascular: Negative for chest pain, palpitations and leg swelling.  Neurological: Positive for headaches.    PE:  Vitals:   04/17/16 1128  BP: 128/72  Pulse: 92  Temp: 97.5 F (36.4 C)   General:  Alert and non-toxic, WDWN, NAD HEENT: NCAT, PERLA, EOM normal, no occular discharge or erythema.  Nasal mucosal edema with sinus tenderness to palpation.  Oropharynx clear with minimal oropharyngeal edema and erythema.  Mucous membranes moist and pink. Neck:  Cervical adenopathy Chest:  RRR no MRGs.  Lungs clear to auscultation A&P with no wheezes rhonchi or rales.   Abdomen: +BS x 4 quadrants, soft, non-tender, no guarding, rigidity, or rebound. Skin: warm and dry no rash Neuro: A&Ox4, CN II-XII grossly intact  Assessment and Plan:   1. Acute URI -cont nasal saline -elevated HOB at bedtime -scheduled inhalers -return to office if no improvement, seek ER treatment if worsening shortness of breath - promethazine-dextromethorphan (PROMETHAZINE-DM) 6.25-15 MG/5ML syrup; Take 5-10 ML PO q8hrs prn for cough and congestion.  Dispense: 360 mL; Refill: 1 - predniSONE (DELTASONE) 20 MG tablet; 3 tabs po daily  x 3 days, then 2 tabs x 3 days, then 1.5 tabs x 3 days, then 1 tab x 3 days, then 0.5 tabs x 3 days  Dispense: 27 tablet; Refill: 0 - azithromycin (ZITHROMAX Z-PAK) 250 MG tablet; 2 po day one, then 1 daily x 4 days  Dispense: 6 tablet; Refill: 0 - Glycopyrrolate-Formoterol (BEVESPI AEROSPHERE) 9-4.8 MCG/ACT AERO; Inhale 2 puffs into the lungs 2 (two) times daily.  Dispense: 1 Inhaler; Refill: 0 - albuterol (PROVENTIL HFA;VENTOLIN HFA) 108 (90 Base) MCG/ACT inhaler; Inhale 1-2 puffs into the lungs every 6 (six) hours as needed for wheezing or shortness of breath (cough).  Dispense: 1 Inhaler; Refill: 0 - fluticasone (FLONASE) 50 MCG/ACT nasal spray; Place 2 sprays into both nostrils daily.  Dispense: 16 g; Refill: 0

## 2016-04-17 NOTE — Patient Instructions (Signed)
Please use bevespi 2 puffs twice daily  Please use albuterol 2 puffs every six hours.  Please take prednisone with breakfast until it is gone.  Please take zpak until gone.  Please take phenergan dm up to 3 times daily for cough.  Use astelin 2 sprays per nostril right before bedtime.  Please use flonase 2 sprays per nostril.  Please use saline spray in your nose as often as possible.  Please continue your daily allegra.  Please take 50 mg of benadryl at bedtime.

## 2016-05-15 ENCOUNTER — Encounter: Payer: Self-pay | Admitting: Internal Medicine

## 2016-05-15 ENCOUNTER — Ambulatory Visit (INDEPENDENT_AMBULATORY_CARE_PROVIDER_SITE_OTHER): Payer: Commercial Managed Care - PPO | Admitting: Internal Medicine

## 2016-05-15 VITALS — BP 134/84 | HR 84 | Temp 97.1°F | Resp 16 | Ht 68.5 in | Wt 198.0 lb

## 2016-05-15 DIAGNOSIS — Z136 Encounter for screening for cardiovascular disorders: Secondary | ICD-10-CM | POA: Diagnosis not present

## 2016-05-15 DIAGNOSIS — R5383 Other fatigue: Secondary | ICD-10-CM

## 2016-05-15 DIAGNOSIS — Z0000 Encounter for general adult medical examination without abnormal findings: Secondary | ICD-10-CM | POA: Diagnosis not present

## 2016-05-15 DIAGNOSIS — R7303 Prediabetes: Secondary | ICD-10-CM

## 2016-05-15 DIAGNOSIS — Z111 Encounter for screening for respiratory tuberculosis: Secondary | ICD-10-CM | POA: Diagnosis not present

## 2016-05-15 DIAGNOSIS — Z1212 Encounter for screening for malignant neoplasm of rectum: Secondary | ICD-10-CM

## 2016-05-15 DIAGNOSIS — E782 Mixed hyperlipidemia: Secondary | ICD-10-CM

## 2016-05-15 DIAGNOSIS — I10 Essential (primary) hypertension: Secondary | ICD-10-CM | POA: Diagnosis not present

## 2016-05-15 DIAGNOSIS — Z1211 Encounter for screening for malignant neoplasm of colon: Secondary | ICD-10-CM

## 2016-05-15 DIAGNOSIS — Z0001 Encounter for general adult medical examination with abnormal findings: Secondary | ICD-10-CM

## 2016-05-15 DIAGNOSIS — Z79899 Other long term (current) drug therapy: Secondary | ICD-10-CM

## 2016-05-15 DIAGNOSIS — E559 Vitamin D deficiency, unspecified: Secondary | ICD-10-CM

## 2016-05-15 LAB — CBC WITH DIFFERENTIAL/PLATELET
BASOS ABS: 0 {cells}/uL (ref 0–200)
BASOS PCT: 0 %
EOS ABS: 84 {cells}/uL (ref 15–500)
Eosinophils Relative: 1 %
HEMATOCRIT: 44.6 % (ref 35.0–45.0)
Hemoglobin: 15.1 g/dL (ref 11.7–15.5)
Lymphocytes Relative: 32 %
Lymphs Abs: 2688 cells/uL (ref 850–3900)
MCH: 30.3 pg (ref 27.0–33.0)
MCHC: 33.9 g/dL (ref 32.0–36.0)
MCV: 89.6 fL (ref 80.0–100.0)
MONO ABS: 588 {cells}/uL (ref 200–950)
MONOS PCT: 7 %
MPV: 10.4 fL (ref 7.5–12.5)
NEUTROS ABS: 5040 {cells}/uL (ref 1500–7800)
Neutrophils Relative %: 60 %
PLATELETS: 289 10*3/uL (ref 140–400)
RBC: 4.98 MIL/uL (ref 3.80–5.10)
RDW: 14.2 % (ref 11.0–15.0)
WBC: 8.4 10*3/uL (ref 3.8–10.8)

## 2016-05-15 NOTE — Progress Notes (Signed)
[facility] Unk Pinto, M.D.        Uvaldo Bristle. Silverio Lay, P.A.-C [facility]                [address] [place] [address] Telephone [phone] Telefax [phone]  Annual Screening/Preventative Visit & Comprehensive Evaluation &  Examination     This very nice 66 y.o. MWF presents for a Screening/Preventative Visit & comprehensive evaluation and management of multiple medical co-morbidities.  Patient has been followed for HTN, Prediabetes, Hyperlipidemia and Vitamin D Deficiency. Patient is very emotionally distraut and tearful today over the recent loss of her mother in February.       Patient has been followed expectantly  for labile HTN since 2002 and in 2004 she was CV from pAfib. She had a Negative Cardiolite in 2011.  Patient's BP has been controlled at home and patient denies any cardiac symptoms as chest pain, palpitations, shortness of breath, dizziness or ankle swelling. Today's BP is elevated and rechecked at 134/84.       Patient's hyperlipidemia is controlled with diet and medications. Patient denies myalgias or other medication SE's. Last lipids were at goal: Lab Results  Component Value Date   CHOL 191 10/18/2015   HDL 73 10/18/2015   LDLCALC 94 10/18/2015   TRIG 121 10/18/2015   CHOLHDL 2.6 10/18/2015      Patient has prediabetes (A1c 5.8% in 2011 and 5.9% in 2016) and patient denies reactive hypoglycemic symptoms, visual blurring, diabetic polys, or paresthesias. Last A1c was at goal: Lab Results  Component Value Date   HGBA1C 5.5 10/18/2015      Finally, patient has history of Vitamin D Deficiency ("36" in 2010) and last Vitamin D was still very low : Lab Results  Component Value Date   VD25OH 34 04/16/2015   Current Outpatient Prescriptions on File Prior to Visit  Medication Sig  . albuterol (PROVENTIL HFA;VENTOLIN HFA) 108 (90 Base) MCG/ACT inhaler Inhale 1-2  puffs into the lungs every 6 (six) hours as needed for wheezing or shortness of breath (cough).  . ALPRAZolam (XANAX) 1 MG tablet TAKE 1/2 TO 1 TAB THREE TIMES DAILY AS NEEDED  . azelastine (ASTELIN) 0.1 % nasal spray Place 2 sprays into both nostrils 2 (two) times daily. Use in each nostril as directed  . buPROPion (WELLBUTRIN XL) 300 MG 24 hr tablet TAKE 1 TABLET (300 MG TOTAL) BY MOUTH DAILY.  . citalopram (CELEXA) 40 MG tablet TAKE 1 TABLET BY MOUTH EVERY DAY**MAX 30 DAYS PER INS**  . cyclobenzaprine (FLEXERIL) 10 MG tablet TAKE 1/2 TO 1 TABLET BY MOUTH 3 TIMES A DAY AS NEEDED AS NEEDED FOR MUSCLE SPASM  . fluticasone (FLONASE) 50 MCG/ACT nasal spray Place 2 sprays into both nostrils daily.  . Glycopyrrolate-Formoterol (BEVESPI AEROSPHERE) 9-4.8 MCG/ACT AERO Inhale 2 puffs into the lungs 2 (two) times daily.  Marland Kitchen Hyoscyamine Sulfate SL (LEVSIN/SL) 0.125 MG SUBL Place 1 tablet under the tongue 3 (three) times daily.  . metoCLOPramide (REGLAN) 5 MG tablet Take 1 tablet (5 mg total) by mouth 3 (three) times daily before meals.  . metoprolol succinate (TOPROL-XL) 50 MG 24 hr tablet TAKE 1 TABLET BY MOUTH EVERY DAY *MAX 30 DAYS ON INSURANCE*  . Multiple Vitamin (MULTIVITAMIN) tablet Take 1 tablet by mouth daily.  . Omega-3 Fatty Acids (FISH OIL PO) Take by mouth.  . ondansetron (  ZOFRAN ODT) 4 MG disintegrating tablet 4mg  ODT q4 hours prn nausea/vomit  . Ascorbic Acid (VITAMIN C) 1000 MG tablet Take 1,000 mg by mouth daily.  Marland Kitchen aspirin 81 MG tablet Take 81 mg by mouth daily.  . hydrochlorothiazide (MICROZIDE) 12.5 MG capsule TAKE 1 CAPSULE (12.5 MG TOTAL) BY MOUTH DAILY.((MAX PER INSURANCE IS 30 DAYS)) (Patient not taking: Reported on 05/15/2016)  . Vitamin D, Ergocalciferol, (DRISDOL) 50000 UNITS CAPS capsule Take 1 capsule daily or as directed for severe Vitamin D Deficiency (Patient not taking: Reported on 05/15/2016)   No current facility-administered medications on file prior to visit.    No Known  Allergies  Past Medical History:  Diagnosis Date  . A-fib ([place])   . Arthritis   . Hyperlipidemia   . Hypertension   . Personal history of colonic polyp- adenoma 09/22/2013  . Pre-diabetes   . Vitamin D deficiency    Health Maintenance  Topic Date Due  . PAP SMEAR  04/24/1973  . MAMMOGRAM  04/16/2015  . INFLUENZA VACCINE  08/13/2016  . COLONOSCOPY  09/17/2018  . TETANUS/TDAP  10/17/2025  . Hepatitis C Screening  Completed  . HIV Screening  Completed   Immunization History  Administered Date(s) Administered  . DTaP 01/13/2001  . Influenza Whole 10/06/2012  . Influenza,inj,quad, With Preservative 10/18/2015  . Influenza-Unspecified 11/11/2013, 11/15/2014  . PPD Test 01/14/2013, 04/17/2014, 04/16/2015  . Tdap 10/18/2015  . Zoster 11/19/2009   Past Surgical History:  Procedure Laterality Date  . APPENDECTOMY    . BREAST SURGERY Bilateral 1983   SQ mastectomies  . CARDIAC DEFIBRILLATOR PLACEMENT    . CARDIOVERSION    . CHOLECYSTECTOMY    . COLONOSCOPY    . LAMINECTOMY  1982   L4-L5  . NASAL SEPTUM SURGERY    . TONSILLECTOMY    . VAGINAL HYSTERECTOMY  1985   Family History  Problem Relation Age of Onset  . Hypertension Mother   . Barrett's esophagus Mother   . Hypertension Father   . Lung cancer Father   . Thyroid disease Father   . Colon cancer Neg Hx    Social History  Substance Use Topics  . Smoking status: Former Smoker    Quit date: 04/18/2012  . Smokeless tobacco: Current User     Comment: E-Cigarretts  . Alcohol use 1.8 oz/week    3 Standard drinks or equivalent per week     Comment: social    ROS Constitutional: Denies fever, chills, weight loss/gain, headaches, insomnia,  night sweats, and change in appetite. Does c/o fatigue. Eyes: Denies redness, blurred vision, diplopia, discharge, itchy, watery eyes.  ENT: Denies discharge, congestion, post nasal drip, epistaxis, sore throat, earache, hearing loss, dental pain, Tinnitus, Vertigo, Sinus pain,  snoring.  Cardio: Denies chest pain, palpitations, irregular heartbeat, syncope, dyspnea, diaphoresis, orthopnea, PND, claudication, edema Respiratory: denies cough, dyspnea, DOE, pleurisy, hoarseness, laryngitis, wheezing.  Gastrointestinal: Denies dysphagia, heartburn, reflux, water brash, pain, cramps, nausea, vomiting, bloating, diarrhea, constipation, hematemesis, melena, hematochezia, jaundice, hemorrhoids Genitourinary: Denies dysuria, frequency, urgency, nocturia, hesitancy, discharge, hematuria, flank pain Breast: Breast lumps, nipple discharge, bleeding.  Musculoskeletal: Denies arthralgia, myalgia, stiffness, Jt. Swelling, pain, limp, and strain/sprain. Denies falls. Skin: Denies puritis, rash, hives, warts, acne, eczema, changing in skin lesion Neuro: No weakness, tremor, incoordination, spasms, paresthesia, pain Psychiatric: Denies confusion, memory loss, sensory loss. Denies Depression. Endocrine: Denies change in weight, skin, hair change, nocturia, and paresthesia, diabetic polys, visual blurring, hyper / hypo glycemic episodes.  Heme/Lymph: No excessive bleeding,  bruising, enlarged lymph nodes.  Physical Exam  BP (!) 132/98   Pulse 84   Temp 97.1 F (36.2 C)   Resp 16   Ht 5' 8.5" (1.74 m)   Wt 198 lb (89.8 kg)   BMI 29.67 kg/m   General Appearance: Well nourished, well groomed and in no apparent distress.  Eyes: PERRLA, EOMs, conjunctiva no swelling or erythema, normal fundi and vessels. Sinuses: No frontal/maxillary tenderness ENT/Mouth: EACs patent / TMs  nl. Nares clear without erythema, swelling, mucoid exudates. Oral hygiene is good. No erythema, swelling, or exudate. Tongue normal, non-obstructing. Tonsils not swollen or erythematous. Hearing normal.  Neck: Supple, thyroid normal. No bruits, nodes or JVD. Respiratory: Respiratory effort normal.  BS equal and clear bilateral without rales, rhonci, wheezing or stridor. Cardio: Heart sounds are normal with regular  rate and rhythm and no murmurs, rubs or gallops. Peripheral pulses are normal and equal bilaterally without edema. No aortic or femoral bruits. Chest: symmetric with normal excursions and percussion. Breasts: Deferred by patient Abdomen: Flat, soft with bowel sounds active. Nontender, no guarding, rebound, hernias, masses, or organomegaly.  Lymphatics: Non tender without lymphadenopathy.  Musculoskeletal: Full ROM all peripheral extremities, joint stability, 5/5 strength, and normal gait. Skin: Warm and dry without rashes, lesions, cyanosis, clubbing or  ecchymosis.  Neuro: Cranial nerves intact, reflexes equal bilaterally. Normal muscle tone, no cerebellar symptoms. Sensation intact.  Pysch: Alert and oriented X 3, normal affect, Insight and Judgment appropriate.   Assessment and Plan  1. Annual Preventative Screening Examination  2. Essential hypertension  - EKG 12-Lead - [place], RETROPERITNL ABD,  LTD - Urinalysis, Routine w reflex microscopic - Microalbumin / creatinine urine ratio - CBC with Differential/Platelet - BASIC METABOLIC PANEL WITH GFR - Magnesium - TSH  3. Hyperlipidemia, mixed  - EKG 12-Lead - [place], RETROPERITNL ABD,  LTD - Hepatic function panel - Lipid panel - TSH  4. Prediabetes  - EKG 12-Lead - [place], RETROPERITNL ABD,  LTD - Hemoglobin A1c - Insulin, random  5. Vitamin D deficiency  - VITAMIN D 25 Hydroxy   6. Screening for ischemic heart disease  - EKG 12-Lead  7. Screening for AAA (aortic abdominal aneurysm)  - [place], RETROPERITNL ABD,  LTD  8. Fatigue  - Vitamin B12 - Iron and TIBC - CBC with Differential/Platelet - TSH  9. Medication management  - Urinalysis, Routine w reflex microscopic - Microalbumin / creatinine urine ratio - CBC with Differential/Platelet - BASIC METABOLIC PANEL WITH GFR - Hepatic function panel - Magnesium - Lipid panel - TSH - Hemoglobin A1c - Insulin, random - VITAMIN D 25 Hydroxy   10. Screening for  colorectal cancer  - POC Hemoccult Bld/Stl   11. Mixed hyperlipidemia   12. Screening examination for pulmonary tuberculosis  - PPD       Patient admits last digital MGM was in 2015 at [facility] and was strongly advised to schedule recommended follow-up MGM.        Also long discussions re: Grief counseling and options she has, eg Hospice, etc.       Patient was counseled in prudent diet to achieve/maintain BMI less than 25 for weight control, BP monitoring, regular exercise and medications. Discussed med's effects and SE's. Screening labs and tests as requested with regular follow-up as recommended. Over 40 minutes of exam, counseling, chart review and high complex critical decision making was performed.

## 2016-05-15 NOTE — Patient Instructions (Signed)

## 2016-05-16 LAB — MICROALBUMIN / CREATININE URINE RATIO
CREATININE, URINE: 172 mg/dL (ref 20–320)
MICROALB UR: 0.4 mg/dL
Microalb Creat Ratio: 2 mcg/mg creat (ref <30)

## 2016-05-16 LAB — URINALYSIS, ROUTINE W REFLEX MICROSCOPIC
Bilirubin Urine: NEGATIVE
Glucose, UA: NEGATIVE
HGB URINE DIPSTICK: NEGATIVE
Ketones, ur: NEGATIVE
LEUKOCYTES UA: NEGATIVE
NITRITE: NEGATIVE
PROTEIN: NEGATIVE
Specific Gravity, Urine: 1.025 (ref 1.001–1.035)
pH: 5.5 (ref 5.0–8.0)

## 2016-05-16 LAB — HEPATIC FUNCTION PANEL
ALBUMIN: 4.3 g/dL (ref 3.6–5.1)
ALK PHOS: 66 U/L (ref 33–130)
ALT: 22 U/L (ref 6–29)
AST: 16 U/L (ref 10–35)
Bilirubin, Direct: 0.1 mg/dL (ref <=0.2)
Indirect Bilirubin: 0.6 mg/dL (ref 0.2–1.2)
TOTAL PROTEIN: 7.3 g/dL (ref 6.1–8.1)
Total Bilirubin: 0.7 mg/dL (ref 0.2–1.2)

## 2016-05-16 LAB — BASIC METABOLIC PANEL WITH GFR
BUN: 18 mg/dL (ref 7–25)
CHLORIDE: 106 mmol/L (ref 98–110)
CO2: 23 mmol/L (ref 20–31)
CREATININE: 0.88 mg/dL (ref 0.50–0.99)
Calcium: 9.6 mg/dL (ref 8.6–10.4)
GFR, Est African American: 80 mL/min (ref >=60)
GFR, Est Non African American: 70 mL/min (ref >=60)
Glucose, Bld: 90 mg/dL (ref 65–99)
POTASSIUM: 4.3 mmol/L (ref 3.5–5.3)
Sodium: 139 mmol/L (ref 135–146)

## 2016-05-16 LAB — VITAMIN B12: VITAMIN B 12: 277 pg/mL (ref 200–1100)

## 2016-05-16 LAB — LIPID PANEL
Cholesterol: 231 mg/dL — ABNORMAL HIGH (ref <200)
HDL: 86 mg/dL (ref >50)
LDL CALC: 122 mg/dL — AB (ref <100)
Total CHOL/HDL Ratio: 2.7 Ratio (ref <5.0)
Triglycerides: 114 mg/dL (ref <150)
VLDL: 23 mg/dL (ref <30)

## 2016-05-16 LAB — TSH: TSH: 0.84 m[IU]/L

## 2016-05-16 LAB — MAGNESIUM: MAGNESIUM: 2 mg/dL (ref 1.5–2.5)

## 2016-05-16 LAB — IRON AND TIBC
%SAT: 38 % (ref 11–50)
Iron: 135 ug/dL (ref 45–160)
TIBC: 359 ug/dL (ref 250–450)
UIBC: 224 ug/dL (ref 125–400)

## 2016-05-16 LAB — VITAMIN D 25 HYDROXY (VIT D DEFICIENCY, FRACTURES): VIT D 25 HYDROXY: 29 ng/mL — AB (ref 30–100)

## 2016-05-16 LAB — HEMOGLOBIN A1C
HEMOGLOBIN A1C: 5.3 % (ref <5.7)
MEAN PLASMA GLUCOSE: 105 mg/dL

## 2016-05-16 LAB — INSULIN, RANDOM: INSULIN: 5 u[IU]/mL (ref 2.0–19.6)

## 2016-05-24 ENCOUNTER — Other Ambulatory Visit: Payer: Self-pay | Admitting: Internal Medicine

## 2016-06-07 ENCOUNTER — Other Ambulatory Visit: Payer: Self-pay | Admitting: Internal Medicine

## 2016-06-07 DIAGNOSIS — F411 Generalized anxiety disorder: Secondary | ICD-10-CM

## 2016-06-07 NOTE — Telephone Encounter (Signed)
Please call Alpraz  

## 2016-08-23 ENCOUNTER — Other Ambulatory Visit: Payer: Self-pay | Admitting: Internal Medicine

## 2016-09-02 NOTE — Progress Notes (Signed)
FOLLOW UP  Assessment and Plan:   Essential hypertension - continue medications, DASH diet, exercise and monitor at home. Call if greater than 130/80.  -     CBC with Differential/Platelet -     BASIC METABOLIC PANEL WITH GFR -     Hepatic function panel -     TSH -     DG Chest 2 View; Future  Chronic atrial fibrillation (HCC) Continue cardio follow up  Hyperlipidemia -continue medications, check lipids, decrease fatty foods, increase activity.  -     Lipid panel  Prediabetes -     Hemoglobin A1c  Medication management -     Magnesium  Anxiety and depression - continue medications, stress management techniques discussed, increase water, good sleep hygiene discussed, increase exercise, and increase veggies.   Diarrhea, unspecified type ? May need to start questran since she is without gallbladder But she has LLQ tenderness with slight rebound tenderness no other peritoneal signs, will do ABX and follow up 1 month, If any worse pain go to ER -     ciprofloxacin (CIPRO) 500 MG tablet; Take 1 tablet (500 mg total) by mouth 2 (two) times daily. -     metroNIDAZOLE (FLAGYL) 500 MG tablet; Take 1 tablet (500 mg total) by mouth 2 (two) times daily. 14 days  Arthralgia, unspecified joint Check labs, rule out PMR mobic -     Sedimentation rate -     CK -     C-reactive protein -     ANA -     Anti-DNA antibody, double-stranded -     Rheumatoid factor -     meloxicam (MOBIC) 15 MG tablet; Take one daily with food for 2 weeks, can take with tylenol, can not take with aleve, iburpofen, then as needed daily for pain   Continue diet and meds as discussed. Further disposition pending results of labs.   HPI 66 y.o. female  presents for 3 month follow up with hypertension, hyperlipidemia, prediabetes and vitamin D. Her blood pressure has been controlled at home, today their BP is BP: 122/80 She does workout. She denies chest pain, shortness of breath, dizziness. Former smoker.   She has had diarrhea, went to ER July 22nd,has had off and on for years but worse, was treated for infection of camptor but states still having diarrhea.  Had + fecal fat 01/2015.  Tearful, mom's birthday was yesterday.  She has been having a lot of pain in her neck, bilateral arms. Has followed with ortho, having bilateral shoulder and hip pain.  She is not on cholesterol medication and denies myalgias. Her cholesterol is at goal. The cholesterol last visit was:   Lab Results  Component Value Date   CHOL 231 (H) 05/15/2016   HDL 86 05/15/2016   LDLCALC 122 (H) 05/15/2016   TRIG 114 05/15/2016   CHOLHDL 2.7 05/15/2016   She has been working on diet and exercise for prediabetes, and denies paresthesia of the feet, polydipsia and polyuria. Last A1C in the office was:  Lab Results  Component Value Date   HGBA1C 5.3 05/15/2016   Patient is on Vitamin D supplement.   BMI is Body mass index is 28.77 kg/m., she is working on diet and exercise. Wt Readings from Last 3 Encounters:  09/04/16 192 lb (87.1 kg)  05/15/16 198 lb (89.8 kg)  04/17/16 199 lb (90.3 kg)    Current Medications:  Current Outpatient Prescriptions on File Prior to Visit  Medication Sig Dispense Refill  .  Ascorbic Acid (VITAMIN C) 1000 MG tablet Take 1,000 mg by mouth daily.    Marland Kitchen aspirin 81 MG tablet Take 81 mg by mouth daily.    Marland Kitchen buPROPion (WELLBUTRIN XL) 300 MG 24 hr tablet TAKE 1 TABLET (300 MG TOTAL) BY MOUTH DAILY. 90 tablet 1  . citalopram (CELEXA) 40 MG tablet TAKE 1 TABLET BY MOUTH EVERY DAY 90 tablet 1  . cyclobenzaprine (FLEXERIL) 10 MG tablet TAKE 1/2 TO 1 TABLET BY MOUTH 3 TIMES A DAY AS NEEDED AS NEEDED FOR MUSCLE SPASM 90 tablet 3  . fluticasone (FLONASE) 50 MCG/ACT nasal spray Place 2 sprays into both nostrils daily. 16 g 0  . Glycopyrrolate-Formoterol (BEVESPI AEROSPHERE) 9-4.8 MCG/ACT AERO Inhale 2 puffs into the lungs 2 (two) times daily. 1 Inhaler 0  . hydrochlorothiazide (MICROZIDE) 12.5 MG capsule  TAKE 1 CAPSULE (12.5 MG TOTAL) BY MOUTH DAILY.((MAX PER INSURANCE IS 30 DAYS)) 90 capsule 1  . metoprolol succinate (TOPROL-XL) 50 MG 24 hr tablet TAKE 1 TABLET BY MOUTH EVERY DAY *MAX 30 DAYS ON INSURANCE* 90 tablet 1  . Multiple Vitamin (MULTIVITAMIN) tablet Take 1 tablet by mouth daily.    . Omega-3 Fatty Acids (FISH OIL PO) Take by mouth.    . ondansetron (ZOFRAN ODT) 4 MG disintegrating tablet 4mg  ODT q4 hours prn nausea/vomit 20 tablet 0  . Vitamin D, Ergocalciferol, (DRISDOL) 50000 UNITS CAPS capsule Take 1 capsule daily or as directed for severe Vitamin D Deficiency 30 capsule 99   No current facility-administered medications on file prior to visit.    Medical History:  Past Medical History:  Diagnosis Date  . A-fib ([place])   . Arthritis   . Hyperlipidemia   . Hypertension   . Personal history of colonic polyp- adenoma 09/22/2013  . Pre-diabetes   . Vitamin D deficiency    Allergies:  No Active Allergies   Review of Systems  Constitutional: Positive for malaise/fatigue. Negative for chills and fever.  HENT: Negative for congestion, ear pain and sore throat.   Eyes: Negative.   Respiratory: Negative for cough, shortness of breath and wheezing.   Cardiovascular: Negative for chest pain, palpitations and leg swelling.  Gastrointestinal: Positive for abdominal pain and diarrhea. Negative for blood in stool, constipation, heartburn, melena, nausea and vomiting.  Genitourinary: Negative.   Musculoskeletal: Positive for back pain, joint pain, myalgias and neck pain.  Skin: Negative.   Neurological: Negative for dizziness, sensory change, loss of consciousness, weakness and headaches.  Psychiatric/Behavioral: Negative for depression. The patient is not nervous/anxious and does not have insomnia.      Family history- Review and unchanged Social history- Review and unchanged Physical Exam: BP 122/80   Pulse 86   Temp 97.6 F (36.4 C)   Resp 16   Ht 5' 8.5" (1.74 m)   Wt 192  lb (87.1 kg)   SpO2 98%   BMI 28.77 kg/m  Wt Readings from Last 3 Encounters:  09/04/16 192 lb (87.1 kg)  05/15/16 198 lb (89.8 kg)  04/17/16 199 lb (90.3 kg)   General Appearance: Well nourished, in no apparent distress. Eyes: PERRLA, EOMs, conjunctiva no swelling or erythema Sinuses: No Frontal/maxillary tenderness ENT/Mouth: Ext aud canals clear, TMs without erythema, bulging. No erythema, swelling, or exudate on post pharynx.  Tonsils not swollen or erythematous. Hearing normal.  Neck: Supple, thyroid normal, left supraclavicular fossa fullness, no masses  Respiratory: Respiratory effort normal, BS equal bilaterally without rales, rhonchi, wheezing or stridor.  Cardio: RRR with no MRGs.  Brisk peripheral pulses without edema.  Abdomen: Soft, + BS. + LLQ tenderness with guarding and rebound, no hernias, masses. Lymphatics: Non tender without lymphadenopathy.  Musculoskeletal: Full ROM, 5/5 strength, normal gait. Skin: Warm, dry without rashes, lesions, ecchymosis.  Neuro: Cranial nerves intact. Normal muscle tone, no cerebellar symptoms. Sensation intact.  Psych: Awake and oriented X 3, normal affect, Insight and Judgment appropriate.    Vicie Mutters 2:46 PM

## 2016-09-04 ENCOUNTER — Other Ambulatory Visit: Payer: Self-pay | Admitting: Internal Medicine

## 2016-09-04 ENCOUNTER — Ambulatory Visit (HOSPITAL_COMMUNITY)
Admission: RE | Admit: 2016-09-04 | Discharge: 2016-09-04 | Disposition: A | Discharge status: Home / Self Care | Payer: Commercial Managed Care - PPO | Source: Ambulatory Visit | Attending: Physician Assistant | Admitting: Physician Assistant

## 2016-09-04 ENCOUNTER — Ambulatory Visit (INDEPENDENT_AMBULATORY_CARE_PROVIDER_SITE_OTHER): Payer: Commercial Managed Care - PPO | Admitting: Physician Assistant

## 2016-09-04 ENCOUNTER — Encounter: Payer: Self-pay | Admitting: Physician Assistant

## 2016-09-04 VITALS — BP 122/80 | HR 86 | Temp 97.6°F | Resp 16 | Ht 68.5 in | Wt 192.0 lb

## 2016-09-04 DIAGNOSIS — R197 Diarrhea, unspecified: Secondary | ICD-10-CM

## 2016-09-04 DIAGNOSIS — I10 Essential (primary) hypertension: Secondary | ICD-10-CM | POA: Diagnosis not present

## 2016-09-04 DIAGNOSIS — J449 Chronic obstructive pulmonary disease, unspecified: Secondary | ICD-10-CM | POA: Diagnosis not present

## 2016-09-04 DIAGNOSIS — J984 Other disorders of lung: Secondary | ICD-10-CM | POA: Diagnosis not present

## 2016-09-04 DIAGNOSIS — F419 Anxiety disorder, unspecified: Secondary | ICD-10-CM | POA: Diagnosis not present

## 2016-09-04 DIAGNOSIS — I482 Chronic atrial fibrillation, unspecified: Secondary | ICD-10-CM

## 2016-09-04 DIAGNOSIS — F411 Generalized anxiety disorder: Secondary | ICD-10-CM

## 2016-09-04 DIAGNOSIS — R7303 Prediabetes: Secondary | ICD-10-CM

## 2016-09-04 DIAGNOSIS — F32A Depression, unspecified: Secondary | ICD-10-CM

## 2016-09-04 DIAGNOSIS — E782 Mixed hyperlipidemia: Secondary | ICD-10-CM | POA: Diagnosis not present

## 2016-09-04 DIAGNOSIS — F329 Major depressive disorder, single episode, unspecified: Secondary | ICD-10-CM

## 2016-09-04 DIAGNOSIS — Z79899 Other long term (current) drug therapy: Secondary | ICD-10-CM

## 2016-09-04 DIAGNOSIS — M2550 Pain in unspecified joint: Secondary | ICD-10-CM

## 2016-09-04 MED ORDER — MELOXICAM 15 MG PO TABS: ORAL_TABLET | ORAL | 1 refills | Status: DC

## 2016-09-04 MED ORDER — CIPROFLOXACIN HCL 500 MG PO TABS: 500.0000 mg | ORAL_TABLET | Freq: Two times a day (BID) | ORAL | 0 refills | Status: DC

## 2016-09-04 MED ORDER — METRONIDAZOLE 500 MG PO TABS: 500.0000 mg | ORAL_TABLET | Freq: Two times a day (BID) | ORAL | 0 refills | Status: AC

## 2016-09-04 NOTE — Telephone Encounter (Signed)
Please call Alpraz  

## 2016-09-04 NOTE — Patient Instructions (Signed)
vionic shoes

## 2016-09-05 ENCOUNTER — Encounter: Payer: Self-pay | Admitting: Physician Assistant

## 2016-09-05 LAB — LIPID PANEL
CHOL/HDL RATIO: 2.2 (calc) (ref <5.0)
CHOLESTEROL: 199 mg/dL (ref <200)
HDL: 89 mg/dL (ref >50)
LDL Cholesterol (Calc): 89 mg/dL (calc)
NON-HDL CHOLESTEROL (CALC): 110 mg/dL (ref <130)
TRIGLYCERIDES: 117 mg/dL (ref <150)

## 2016-09-05 LAB — BASIC METABOLIC PANEL WITH GFR
BUN: 10 mg/dL (ref 7–25)
CALCIUM: 9.6 mg/dL (ref 8.6–10.4)
CHLORIDE: 104 mmol/L (ref 98–110)
CO2: 27 mmol/L (ref 20–32)
Creat: 0.75 mg/dL (ref 0.50–0.99)
GFR, EST AFRICAN AMERICAN: 98 mL/min/{1.73_m2} (ref >=60)
GFR, Est Non African American: 84 mL/min/{1.73_m2} (ref >=60)
Glucose, Bld: 100 mg/dL — ABNORMAL HIGH (ref 65–99)
POTASSIUM: 4.2 mmol/L (ref 3.5–5.3)
Sodium: 139 mmol/L (ref 135–146)

## 2016-09-05 LAB — CK: Total CK: 60 U/L (ref 29–143)

## 2016-09-05 LAB — CBC WITH DIFFERENTIAL/PLATELET
BASOS PCT: 0.4 %
Basophils Absolute: 32 cells/uL (ref 0–200)
Eosinophils Absolute: 111 cells/uL (ref 15–500)
Eosinophils Relative: 1.4 %
HCT: 43.1 % (ref 35.0–45.0)
Hemoglobin: 14.7 g/dL (ref 11.7–15.5)
Lymphs Abs: 2441 cells/uL (ref 850–3900)
MCH: 29.9 pg (ref 27.0–33.0)
MCHC: 34.1 g/dL (ref 32.0–36.0)
MCV: 87.8 fL (ref 80.0–100.0)
MPV: 10.9 fL (ref 7.5–12.5)
Monocytes Relative: 8.3 %
NEUTROS PCT: 59 %
Neutro Abs: 4661 cells/uL (ref 1500–7800)
PLATELETS: 279 10*3/uL (ref 140–400)
RBC: 4.91 10*6/uL (ref 3.80–5.10)
RDW: 12.5 % (ref 11.0–15.0)
TOTAL LYMPHOCYTE: 30.9 %
WBC mixed population: 656 cells/uL (ref 200–950)
WBC: 7.9 10*3/uL (ref 3.8–10.8)

## 2016-09-05 LAB — HEPATIC FUNCTION PANEL
AG Ratio: 1.7 (calc) (ref 1.0–2.5)
ALBUMIN MSPROF: 4.5 g/dL (ref 3.6–5.1)
ALT: 20 U/L (ref 6–29)
AST: 17 U/L (ref 10–35)
Alkaline phosphatase (APISO): 68 U/L (ref 33–130)
BILIRUBIN DIRECT: 0.1 mg/dL (ref 0.0–0.2)
BILIRUBIN INDIRECT: 0.6 mg/dL (ref 0.2–1.2)
Globulin: 2.6 g/dL (calc) (ref 1.9–3.7)
Total Bilirubin: 0.7 mg/dL (ref 0.2–1.2)
Total Protein: 7.1 g/dL (ref 6.1–8.1)

## 2016-09-05 LAB — HEMOGLOBIN A1C
EAG (MMOL/L): 6 (calc)
Hgb A1c MFr Bld: 5.4 % of total Hgb (ref <5.7)
Mean Plasma Glucose: 108 (calc)

## 2016-09-05 LAB — C-REACTIVE PROTEIN: CRP: 0.7 mg/L (ref <8.0)

## 2016-09-05 LAB — TSH: TSH: 0.85 mIU/L (ref 0.40–4.50)

## 2016-09-05 LAB — MAGNESIUM: MAGNESIUM: 2 mg/dL (ref 1.5–2.5)

## 2016-09-05 LAB — RHEUMATOID FACTOR: Rhuematoid fact SerPl-aCnc: <14 IU/mL (ref <14)

## 2016-09-05 LAB — ANTI-DNA ANTIBODY, DOUBLE-STRANDED: ds DNA Ab: 1 IU/mL

## 2016-09-05 LAB — ANA: ANA: NEGATIVE

## 2016-09-09 ENCOUNTER — Encounter: Payer: Self-pay | Admitting: Physician Assistant

## 2016-09-10 ENCOUNTER — Encounter: Payer: Self-pay | Admitting: Physician Assistant

## 2016-09-23 ENCOUNTER — Other Ambulatory Visit: Payer: Self-pay | Admitting: Physician Assistant

## 2016-09-29 DIAGNOSIS — J449 Chronic obstructive pulmonary disease, unspecified: Secondary | ICD-10-CM | POA: Insufficient documentation

## 2016-09-29 NOTE — Progress Notes (Signed)
Assessment and Plan:  Needs flu shot -     FLU VACCINE MDCK QUAD W/Preservative  Chronic atrial fibrillation (HCC) Continue cardio follow up  Chronic obstructive pulmonary disease, unspecified COPD type (HCC) Monitor  Acute non-recurrent maxillary sinusitis -     amoxicillin-clavulanate (AUGMENTIN) 875-125 MG tablet; Take 1 tablet by mouth 2 (two) times daily. -     predniSONE (DELTASONE) 20 MG tablet; 2 tablets daily for 3 days, 1 tablet daily for 4 days. -     meloxicam (MOBIC) 15 MG tablet; Take one daily with food for 2 weeks, can take with tylenol, can not take with aleve, iburpofen, then as needed daily for pain  Chronic pain Discussed switching celexa and cymbalta,  Can follow up ortho  Future Appointments Date Time Provider [place]  12/08/2016 2:30 PM Unk Pinto, MD [facility] None  06/15/2017 2:00 PM Unk Pinto, MD [facility] None    HPI 66 y.o.female presents for 1 month follow up for diarrhea.  She was given cipro/flagyl last visit, got better until yesterday. Has cold from husband x 1-2 days, and been taking OTC meds and feels that with that she has had some diarrhea.  Meloxicam helps some with pain but still some pain right arm.   Past Medical History:  Diagnosis Date  . A-fib ([place])   . Arthritis   . Hyperlipidemia   . Hypertension   . Personal history of colonic polyp- adenoma 09/22/2013  . Pre-diabetes   . Vitamin D deficiency      No Active Allergies  Current Outpatient Prescriptions on File Prior to Visit  Medication Sig  . ALPRAZolam (XANAX) 1 MG tablet TAKE 1/2-1 TABLET BY MOUTH 3 TIMES A DAY AS NEEDED  . Ascorbic Acid (VITAMIN C) 1000 MG tablet Take 1,000 mg by mouth daily.  Marland Kitchen aspirin 81 MG tablet Take 81 mg by mouth daily.  Marland Kitchen buPROPion (WELLBUTRIN XL) 300 MG 24 hr tablet TAKE 1 TABLET (300 MG TOTAL) BY MOUTH DAILY.  . citalopram (CELEXA) 40 MG tablet TAKE 1 TABLET BY MOUTH EVERY DAY  . cyclobenzaprine (FLEXERIL) 10 MG  tablet TAKE 1/2 TO 1 TABLET BY MOUTH 3 TIMES A DAY AS NEEDED AS NEEDED FOR MUSCLE SPASM  . hydrochlorothiazide (MICROZIDE) 12.5 MG capsule TAKE 1 CAPSULE (12.5 MG TOTAL) BY MOUTH DAILY.((MAX PER INSURANCE IS 30 DAYS))  . meloxicam (MOBIC) 15 MG tablet Take one daily with food for 2 weeks, can take with tylenol, can not take with aleve, iburpofen, then as needed daily for pain  . metoprolol succinate (TOPROL-XL) 50 MG 24 hr tablet TAKE 1 TABLET BY MOUTH EVERY DAY *MAX 30 DAYS ON INSURANCE*  . Multiple Vitamin (MULTIVITAMIN) tablet Take 1 tablet by mouth daily.  . Omega-3 Fatty Acids (FISH OIL PO) Take by mouth.  . ondansetron (ZOFRAN ODT) 4 MG disintegrating tablet 4mg  ODT q4 hours prn nausea/vomit  . Vitamin D, Ergocalciferol, (DRISDOL) 50000 UNITS CAPS capsule Take 1 capsule daily or as directed for severe Vitamin D Deficiency   No current facility-administered medications on file prior to visit.     ROS: all negative except above.   Physical Exam: There were no vitals filed for this visit. There were no vitals taken for this visit. General Appearance: Well nourished, in no apparent distress. Eyes: PERRLA, EOMs, conjunctiva no swelling or erythema Sinuses: No Frontal/maxillary tenderness ENT/Mouth: Ext aud canals clear, TMs without erythema, bulging. No erythema, swelling, or exudate on post pharynx.  Tonsils not swollen or erythematous. Hearing  normal.  Neck: Supple, thyroid normal.  Respiratory: Respiratory effort normal, BS equal bilaterally without rales, rhonchi, wheezing or stridor.  Cardio: RRR with no MRGs. Brisk peripheral pulses without edema.  Abdomen: Soft, + BS.  Non tender, no guarding, rebound, hernias, masses. Lymphatics: Non tender without lymphadenopathy.  Musculoskeletal: Full ROM, 5/5 strength, normal gait.  Skin: Warm, dry without rashes, lesions, ecchymosis.  Neuro: Cranial nerves intact. Normal muscle tone, no cerebellar symptoms. Sensation intact.  Psych: Awake  and oriented X 3, normal affect, Insight and Judgment appropriate.     Vicie Mutters, PA-C 7:41 AM [facility]

## 2016-09-30 ENCOUNTER — Encounter: Payer: Self-pay | Admitting: Physician Assistant

## 2016-09-30 ENCOUNTER — Ambulatory Visit (INDEPENDENT_AMBULATORY_CARE_PROVIDER_SITE_OTHER): Payer: Commercial Managed Care - PPO | Admitting: Physician Assistant

## 2016-09-30 VITALS — BP 132/80 | HR 72 | Temp 97.6°F | Resp 14 | Ht 68.5 in | Wt 188.6 lb

## 2016-09-30 DIAGNOSIS — I482 Chronic atrial fibrillation, unspecified: Secondary | ICD-10-CM

## 2016-09-30 DIAGNOSIS — I10 Essential (primary) hypertension: Secondary | ICD-10-CM

## 2016-09-30 DIAGNOSIS — J0100 Acute maxillary sinusitis, unspecified: Secondary | ICD-10-CM

## 2016-09-30 DIAGNOSIS — Z23 Encounter for immunization: Secondary | ICD-10-CM

## 2016-09-30 DIAGNOSIS — J449 Chronic obstructive pulmonary disease, unspecified: Secondary | ICD-10-CM

## 2016-09-30 DIAGNOSIS — Z79899 Other long term (current) drug therapy: Secondary | ICD-10-CM

## 2016-09-30 MED ORDER — MELOXICAM 15 MG PO TABS: ORAL_TABLET | ORAL | 1 refills | Status: DC

## 2016-09-30 MED ORDER — AMOXICILLIN-POT CLAVULANATE 875-125 MG PO TABS: 1.0000 | ORAL_TABLET | Freq: Two times a day (BID) | ORAL | 0 refills | Status: DC

## 2016-09-30 MED ORDER — PREDNISONE 20 MG PO TABS: ORAL_TABLET | ORAL | 0 refills | Status: DC

## 2016-09-30 NOTE — Patient Instructions (Addendum)
Cut wellbutrin in half for now Do 150mg  daily  Mobic is an antiinflammatory It helps pain, can not take with aleve, or ibuprofen You can take tylenol (500mg ) or tylenol arthritis (650mg ) with the meloxicam/antiinflammatories. The max you can take of tylenol a day is 3000mg  daily, this is a max of 6 pills a day of the regular tyelnol (500mg ) or a max of 4 a day of the tylenol arthritis (650mg ) as long as no other medications you are taking contain tylenol.   Mobic can cause inflammation in your stomach and can cause ulcers or bleeding, this will look like black tarry stools Make sure you take your mobic with food Try not to take it daily, take AS needed Can take with zantac  Stop the mobic for now while on prednisone Take zantac with the prednisone If not better in 5-7 days can take antibiotic    Make sure you are on an allergy pill, see below for more details. Please take the prednisone as directed below, this is NOT an antibiotic so you do NOT have to finish it. You can take it for a few days and stop it if you are doing better.   Please take the prednisone to help decrease inflammation and therefore decrease symptoms. Take it it with food to avoid GI upset. It can cause increased energy but on the other hand it can make it hard to sleep at night so please take it AT [place], it takes 8-12 hours to start working so it will NOT affect your sleeping if you take it at night with your food!!  If you are diabetic it will increase your sugars so decrease carbs and monitor your sugars closely.       HOW TO TREAT VIRAL COUGH AND COLD SYMPTOMS:  -Symptoms usually last at least 1 week with the worst symptoms being around day 4.  - colds usually start with a sore throat and end with a cough, and the cough can take 2 weeks to get better.  -No antibiotics are needed for colds, flu, sore throats, cough, bronchitis UNLESS symptoms are longer than 7 days OR if you are getting better then get  drastically worse.  -There are a lot of combination medications (Dayquil, Nyquil, Vicks 44, tyelnol cold and sinus, ETC). Please look at the ingredients on the back so that you are treating the correct symptoms and not doubling up on medications/ingredients.    Medicines you can use  Nasal congestion  - pseudoephedrine (Sudafed)- behind the counter, do not use if you have high blood pressure, medicine that have -D in them.  - phenylephrine (Sudafed PE) -Dextormethorphan + chlorpheniramine (Coridcidin HBP)- okay if you have high blood pressure -Oxymetazoline (Afrin) nasal spray- LIMIT to 3 days -Saline nasal spray -Neti pot (used distilled or bottled water)  Ear pain/congestion  -pseudoephedrine (sudafed) - Nasonex/flonase nasal spray  Fever  -Acetaminophen (Tyelnol) -Ibuprofen (Advil, motrin, aleve)  Sore Throat  -Acetaminophen (Tyelnol) -Ibuprofen (Advil, motrin, aleve) -Drink a lot of water -Gargle with salt water - Rest your voice (don't talk) -Throat sprays -Cough drops  Body Aches  -Acetaminophen (Tyelnol) -Ibuprofen (Advil, motrin, aleve)  Headache  -Acetaminophen (Tyelnol) -Ibuprofen (Advil, motrin, aleve) - Exedrin, Exedrin Migraine  Allergy symptoms (cough, sneeze, runny nose, itchy eyes) -Claritin or loratadine cheapest but likely the weakest  -Zyrtec or certizine at night because it can make you sleepy -The strongest is allegra or fexafinadine  Cheapest at [employer], [employer], [employer]  Cough  -Dextromethorphan (  Delsym)- medicine that has DM in it -Guafenesin (Mucinex/Robitussin) - cough drops - drink lots of water  Chest Congestion  -Guafenesin (Mucinex/Robitussin)  Red Itchy Eyes  - Naphcon-A  Upset Stomach  - Bland diet (nothing spicy, greasy, fried, and high acid foods like tomatoes, oranges, berries) -OKAY- cereal, bread, soup, crackers, rice -Eat smaller more frequent meals -reduce caffeine, no alcohol -Loperamide (Imodium-AD) if  diarrhea -Prevacid for heart burn  General health when sick  -Hydration -wash your hands frequently -keep surfaces clean -change pillow cases and sheets often -Get fresh air but do not exercise strenuously -Vitamin D, double up on it - Vitamin C -Zinc

## 2016-10-02 ENCOUNTER — Encounter: Payer: Self-pay | Admitting: Physician Assistant

## 2016-10-04 ENCOUNTER — Other Ambulatory Visit: Payer: Self-pay | Admitting: Internal Medicine

## 2016-10-04 DIAGNOSIS — J0100 Acute maxillary sinusitis, unspecified: Secondary | ICD-10-CM

## 2016-10-04 MED ORDER — METRONIDAZOLE 500 MG PO TABS: ORAL_TABLET | ORAL | 0 refills | Status: AC

## 2016-10-04 MED ORDER — LEVOFLOXACIN 750 MG PO TABS: ORAL_TABLET | ORAL | 0 refills | Status: AC

## 2016-10-06 ENCOUNTER — Encounter: Payer: Self-pay | Admitting: Internal Medicine

## 2016-10-06 ENCOUNTER — Ambulatory Visit (INDEPENDENT_AMBULATORY_CARE_PROVIDER_SITE_OTHER): Payer: Commercial Managed Care - PPO | Admitting: Internal Medicine

## 2016-10-06 VITALS — BP 116/76 | HR 68 | Temp 97.0°F | Resp 18 | Ht 68.5 in | Wt 188.0 lb

## 2016-10-06 DIAGNOSIS — R1032 Left lower quadrant pain: Secondary | ICD-10-CM | POA: Diagnosis not present

## 2016-10-06 DIAGNOSIS — Z79899 Other long term (current) drug therapy: Secondary | ICD-10-CM | POA: Diagnosis not present

## 2016-10-06 DIAGNOSIS — R197 Diarrhea, unspecified: Secondary | ICD-10-CM | POA: Diagnosis not present

## 2016-10-06 NOTE — Progress Notes (Signed)
Subjective:    Patient ID: Taylor Li, female    DOB: [birth year], 66 y.o.   MRN: [id number]  HPI   This very nice 66 yo MWF's problems seem to relate back to 7/22 when she was seen at [facility] ER for Abd pain and CT scan was suspect for partial SB obstruction and as she was clinically stable she was d/c'd home on Cipro and GI pathogen panel subsequently returned (+) for Campylobacter and she seemed to improve. Actually she has had ongoing issues with intermittent diarrhea for 2-3 years and she has had several unproductive w/u's including Colonoscopy with Dr Carlean Purl in 2017.       She was seen 8/23 with LLQ pain and tenderness  and was suspect and treated for Diverticulitis with Cipro /Flagyl and once again improved. On 9/18 she was dx'd with a sinusitis and tx'd with Augmentin. On 9/ 23 (yesterday on Sun), she once again developed abdominal cramping and mucoid stools and she was advised stop the Augmentin and Rx'd Levaquin and Flagyl and using her gZofran and Levsin, her sx's have improved, altho she reports an explosive mucoid diarrheal BM just prior to OV today. Denies fevers, chills, sweats or skin rash.   Medication Sig  . ALPRAZolam (XANAX) 1 MG tablet TAKE 1/2-1 TABLET BY MOUTH 3 TIMES A DAY AS NEEDED  . Ascorbic Acid (VITAMIN C) 1000 MG tablet Take 1,000 mg by mouth daily.  Marland Kitchen aspirin 81 MG tablet Take 81 mg by mouth daily.  Marland Kitchen buPROPion (WELLBUTRIN XL) 300 MG 24 hr tablet TAKE 1 TABLET (300 MG TOTAL) BY MOUTH DAILY.  . cyclobenzaprine (FLEXERIL) 10 MG tablet TAKE 1/2 TO 1 TABLET BY MOUTH 3 TIMES A DAY AS NEEDED AS NEEDED FOR MUSCLE SPASM  . hydrochlorothiazide (MICROZIDE) 12.5 MG capsule TAKE 1 CAPSULE (12.5 MG TOTAL) BY MOUTH DAILY.((MAX PER INSURANCE IS 30 DAYS))  . levofloxacin (LEVAQUIN) 750 MG tablet Take 1 tablet daily with food for infection  . meloxicam (MOBIC) 15 MG tablet Take one daily with food  . metoprolol succ-XL 50 MG 24 hr tablet TAKE 1 TAB EVERY DAY  . metroNIDAZOLE  (FLAGYL) 500 MG tablet Take 1 tablet 3 x / day with food for infection  . Multiple Vitamin (MULTIVITAMIN) tablet Take 1 tablet by mouth daily.  . Omega-3 Fatty Acids (FISH OIL PO) Take by mouth.  . ondansetron (ZOFRAN ODT) 4 MG disintegrating tablet 4mg  ODT q4 hours prn nausea/vomit  . Vitamin D, Ergocalciferol, (DRISDOL) 50000 UNITS CAPS capsule Take 1 capsule daily or as directed for severe Vitamin D Deficiency   No Known Allergies   Past Medical History:  Diagnosis Date  . A-fib ([place])   . Arthritis   . Hyperlipidemia   . Hypertension   . Personal history of colonic polyp- adenoma 09/22/2013  . Pre-diabetes   . Vitamin D deficiency    Review of Systems  10 point systems review negative except as above    Objective:   Physical Exam  BP 116/76   Pulse 68   Temp (!) 97 F (36.1 C)   Resp 18   Ht 5' 8.5" (1.74 m)   Wt 188 lb (85.3 kg)   BMI 28.17 kg/m   HEENT - Eac's patent. TM's Nl. EOM's full. PERRLA. NasoOroPharynx clear. Neck - supple. Nl Thyroid. Carotids 2+ & No bruits, nodes, JVD Chest - Clear equal BS w/o Rales, rhonchi, wheezes. Cor - Nl HS. RRR w/o sig MGR. PP 1(+). No edema. Abd -  Sl Bilat lower abd tenderness L>R. BS nl. No G/RB. MS- FROM w/o deformities. Muscle power, tone and bulk Nl. Gait Nl. Neuro - No obvious Cr N abnormalities. Sensory, motor and Cerebellar functions appear Nl w/o focal abnormalities.  Assessment & Plan:   1. Left lower quadrant pain  - CBC with Differential/Platelet  2. Diarrhea of presumed infectious origin  - CBC with Differential/Platelet - BASIC METABOLIC PANEL WITH GFR - Celiac Disease Comprehensive Panel with Reflexes - Hepatic function panel  3. Medication management  - CBC with Differential/Platelet - BASIC METABOLIC PANEL WITH GFR - Hepatic function panel  - Recommend continue Levaquin /Flagyl started yesterday since she's improved somewhat. Pending results of today's labs, based on previous studies, she will need  w/u for malabsorption, pancreatic insufficiency, repeat cultures and colonoscopy to r/o colitis.

## 2016-10-07 LAB — CBC WITH DIFFERENTIAL/PLATELET
Basophils Absolute: 21 cells/uL (ref 0–200)
Basophils Relative: 0.2 %
EOS PCT: 0.9 %
Eosinophils Absolute: 95 cells/uL (ref 15–500)
HEMATOCRIT: 45.2 % — AB (ref 35.0–45.0)
HEMOGLOBIN: 15.2 g/dL (ref 11.7–15.5)
LYMPHS ABS: 2300 {cells}/uL (ref 850–3900)
MCH: 29.2 pg (ref 27.0–33.0)
MCHC: 33.6 g/dL (ref 32.0–36.0)
MCV: 86.9 fL (ref 80.0–100.0)
MPV: 10.6 fL (ref 7.5–12.5)
Monocytes Relative: 7.5 %
NEUTROS ABS: 7388 {cells}/uL (ref 1500–7800)
NEUTROS PCT: 69.7 %
Platelets: 334 10*3/uL (ref 140–400)
RBC: 5.2 10*6/uL — AB (ref 3.80–5.10)
RDW: 12.2 % (ref 11.0–15.0)
Total Lymphocyte: 21.7 %
WBC: 10.6 10*3/uL (ref 3.8–10.8)
WBCMIX: 795 {cells}/uL (ref 200–950)

## 2016-10-07 LAB — HEPATIC FUNCTION PANEL
AG Ratio: 1.6 (calc) (ref 1.0–2.5)
ALBUMIN MSPROF: 4.3 g/dL (ref 3.6–5.1)
ALT: 23 U/L (ref 6–29)
AST: 16 U/L (ref 10–35)
Alkaline phosphatase (APISO): 84 U/L (ref 33–130)
Bilirubin, Direct: 0.1 mg/dL (ref 0.0–0.2)
Globulin: 2.7 g/dL (calc) (ref 1.9–3.7)
Indirect Bilirubin: 0.4 mg/dL (calc) (ref 0.2–1.2)
Total Bilirubin: 0.5 mg/dL (ref 0.2–1.2)
Total Protein: 7 g/dL (ref 6.1–8.1)

## 2016-10-07 LAB — BASIC METABOLIC PANEL WITH GFR
BUN: 14 mg/dL (ref 7–25)
CALCIUM: 9.6 mg/dL (ref 8.6–10.4)
CHLORIDE: 101 mmol/L (ref 98–110)
CO2: 29 mmol/L (ref 20–32)
CREATININE: 0.79 mg/dL (ref 0.50–0.99)
GFR, Est African American: 92 mL/min/{1.73_m2} (ref >=60)
GFR, Est Non African American: 79 mL/min/{1.73_m2} (ref >=60)
Glucose, Bld: 103 mg/dL — ABNORMAL HIGH (ref 65–99)
POTASSIUM: 3.9 mmol/L (ref 3.5–5.3)
Sodium: 140 mmol/L (ref 135–146)

## 2016-10-07 LAB — CELIAC DISEASE COMPREHENSIVE PANEL WITH REFLEXES
(tTG) Ab, IgA: 1 U/mL
IMMUNOGLOBULIN A: 190 mg/dL (ref 81–463)

## 2016-10-12 NOTE — Progress Notes (Signed)
Would you like [place] to call her and make an appointment? Taylor Li

## 2016-10-20 ENCOUNTER — Other Ambulatory Visit: Payer: Self-pay | Admitting: Internal Medicine

## 2016-10-20 DIAGNOSIS — K529 Noninfective gastroenteritis and colitis, unspecified: Secondary | ICD-10-CM

## 2016-10-21 ENCOUNTER — Encounter: Payer: Self-pay | Admitting: Internal Medicine

## 2016-10-21 ENCOUNTER — Other Ambulatory Visit: Payer: PRIVATE HEALTH INSURANCE

## 2016-10-21 ENCOUNTER — Encounter: Payer: Self-pay | Admitting: Physician Assistant

## 2016-10-21 DIAGNOSIS — K529 Noninfective gastroenteritis and colitis, unspecified: Secondary | ICD-10-CM

## 2016-10-21 LAB — CBC WITH DIFFERENTIAL/PLATELET
BASOS ABS: 17 {cells}/uL (ref 0–200)
Basophils Relative: 0.2 %
EOS ABS: 111 {cells}/uL (ref 15–500)
Eosinophils Relative: 1.3 %
HCT: 42.8 % (ref 35.0–45.0)
Hemoglobin: 14.5 g/dL (ref 11.7–15.5)
Lymphs Abs: 2134 cells/uL (ref 850–3900)
MCH: 29.3 pg (ref 27.0–33.0)
MCHC: 33.9 g/dL (ref 32.0–36.0)
MCV: 86.5 fL (ref 80.0–100.0)
MONOS PCT: 8.9 %
MPV: 11.2 fL (ref 7.5–12.5)
Neutro Abs: 5483 cells/uL (ref 1500–7800)
Neutrophils Relative %: 64.5 %
PLATELETS: 354 10*3/uL (ref 140–400)
RBC: 4.95 10*6/uL (ref 3.80–5.10)
RDW: 12.5 % (ref 11.0–15.0)
TOTAL LYMPHOCYTE: 25.1 %
WBC mixed population: 757 cells/uL (ref 200–950)
WBC: 8.5 10*3/uL (ref 3.8–10.8)

## 2016-10-21 LAB — SEDIMENTATION RATE: Sed Rate: 25 mm/h (ref 0–30)

## 2016-10-21 LAB — BASIC METABOLIC PANEL WITH GFR
BUN: 9 mg/dL (ref 7–25)
CHLORIDE: 103 mmol/L (ref 98–110)
CO2: 27 mmol/L (ref 20–32)
Calcium: 9.5 mg/dL (ref 8.6–10.4)
Creat: 0.69 mg/dL (ref 0.50–0.99)
GFR, Est African American: 107 mL/min/{1.73_m2} (ref >=60)
GFR, Est Non African American: 92 mL/min/{1.73_m2} (ref >=60)
GLUCOSE: 100 mg/dL — AB (ref 65–99)
POTASSIUM: 4.1 mmol/L (ref 3.5–5.3)
SODIUM: 139 mmol/L (ref 135–146)

## 2016-10-21 LAB — MAGNESIUM: MAGNESIUM: 1.9 mg/dL (ref 1.5–2.5)

## 2016-10-22 ENCOUNTER — Encounter: Payer: Self-pay | Admitting: Physician Assistant

## 2016-10-23 LAB — GASTROINTESTINAL PATHOGEN PANEL PCR
C. DIFFICILE TOX A/B, PCR: DETECTED — AB
CAMPYLOBACTER, PCR: NOT DETECTED
CRYPTOSPORIDIUM, PCR: NOT DETECTED
E COLI (STEC) STX1/STX2, PCR: NOT DETECTED
E coli (ETEC) LT/ST PCR: NOT DETECTED
E coli 0157, PCR: NOT DETECTED
GIARDIA LAMBLIA, PCR: NOT DETECTED
NOROVIRUS, PCR: NOT DETECTED
ROTAVIRUS, PCR: NOT DETECTED
Salmonella, PCR: NOT DETECTED
Shigella, PCR: NOT DETECTED

## 2016-10-24 ENCOUNTER — Ambulatory Visit (INDEPENDENT_AMBULATORY_CARE_PROVIDER_SITE_OTHER): Payer: Commercial Managed Care - PPO | Admitting: Physician Assistant

## 2016-10-24 ENCOUNTER — Encounter: Payer: Self-pay | Admitting: Physician Assistant

## 2016-10-24 VITALS — BP 118/80 | HR 80 | Ht 69.0 in | Wt 185.4 lb

## 2016-10-24 DIAGNOSIS — A0472 Enterocolitis due to Clostridium difficile, not specified as recurrent: Secondary | ICD-10-CM | POA: Diagnosis not present

## 2016-10-24 DIAGNOSIS — R1084 Generalized abdominal pain: Secondary | ICD-10-CM | POA: Diagnosis not present

## 2016-10-24 MED ORDER — VANCOMYCIN HCL 125 MG PO CAPS: 125.0000 mg | ORAL_CAPSULE | Freq: Four times a day (QID) | ORAL | 0 refills | Status: DC

## 2016-10-24 MED ORDER — HYOSCYAMINE SULFATE 0.125 MG PO TABS: 0.1250 mg | ORAL_TABLET | Freq: Four times a day (QID) | ORAL | 0 refills | Status: DC | PRN

## 2016-10-24 NOTE — Progress Notes (Addendum)
Chief Complaint: Diarrhea, abdominal pain  HPI:  Taylor Li is a 66 year old Caucasian female with a past medical history as listed below,  who was referred to me by Unk Pinto, MD for a complaint of diarrhea and abdominal pain.     Last colonoscopy 09/16/13 with findings of 2 sessile polyps and otherwise normal. Repeat was recommended in 5 years. Patient was last seen in our clinic 03/27/15 by Dr. Carlean Purl and at that time described a six-month history of diarrhea and right upper quadrant achiness. She had steatorrhea at that time. She had a qualitative stool positive for fecal fat which suggested pancreatic insufficiency. Fecal elastase was ordered and a CT of the abdomen. Fecal elastase was greater than 500 and normal. CT 04/04/15 showed no bowel abnormality.    Patient had recent GI pathogen panel which was positive for C. Difficile 09/21/16. ESR was normal. CBC and BMP were normal. Patient had celiac testing 10/06/16 which was normal.    Today, the patient describes at least 6 months of diarrhea and abdominal pain. Initially this was evaluated 08/03/16 at [facility].  She had a CT there which was suspect for partial small bowel obstruction and showed mild enteritis. She was sent home on Cipro and subsequently GI pathogen panel returned positive for Campylobacter. She seemed to improve for at least 3 days per her knowledge after abx for this. She was then seen 8/23 with left lower quadrant pain and tenderness with suspicion for diverticulitis and started on Cipro/ Flagyl and once again improved for a few days. She was then seen 9/18 and diagnosed with sinusitis and treated with Augmentin. Patient tells me that 10/05/16 she once again developed abdominal cramping and mucoid stools and was given Levaquin and Flagyl. Her symptoms seemed to improve some, but over the past week or so the patient has now continued with explosive loose diarrheal stools at least 10 or more per day which are loose and contain some  "mucous". Patient also has intense abdominal cramping almost every 30 minutes. This is accompanied by some nausea. Patient also describes a history of diarrhea for 2-3 years for which she saw Dr. Carlean Purl. Colonoscopy was as above.   Patient denies fever, chills, blood in her stool, melena, anorexia, vomiting, heartburn or reflux.  Past Medical History:  Diagnosis Date  . A-fib ([place])   . Arthritis   . Hyperlipidemia   . Hypertension   . Personal history of colonic polyp- adenoma 09/22/2013  . Pre-diabetes   . Vitamin D deficiency     Past Surgical History:  Procedure Laterality Date  . APPENDECTOMY    . BREAST SURGERY Bilateral 1983   SQ mastectomies  . CARDIAC DEFIBRILLATOR PLACEMENT    . CARDIOVERSION    . CHOLECYSTECTOMY    . COLONOSCOPY    . LAMINECTOMY  1982   L4-L5  . NASAL SEPTUM SURGERY    . TONSILLECTOMY    . VAGINAL HYSTERECTOMY  1985    Current Outpatient Prescriptions  Medication Sig Dispense Refill  . ALPRAZolam (XANAX) 1 MG tablet TAKE 1/2-1 TABLET BY MOUTH 3 TIMES A DAY AS NEEDED 90 tablet 0  . Ascorbic Acid (VITAMIN C) 1000 MG tablet Take 1,000 mg by mouth daily.    Marland Kitchen aspirin 81 MG tablet Take 81 mg by mouth daily.    Marland Kitchen buPROPion (WELLBUTRIN XL) 300 MG 24 hr tablet TAKE 1 TABLET (300 MG TOTAL) BY MOUTH DAILY. 90 tablet 1  . citalopram (CELEXA) 40 MG tablet Take 40 mg  by mouth daily.    . cyclobenzaprine (FLEXERIL) 10 MG tablet TAKE 1/2 TO 1 TABLET BY MOUTH 3 TIMES A DAY AS NEEDED AS NEEDED FOR MUSCLE SPASM 90 tablet 3  . hydrochlorothiazide (MICROZIDE) 12.5 MG capsule TAKE 1 CAPSULE (12.5 MG TOTAL) BY MOUTH DAILY.((MAX PER INSURANCE IS 30 DAYS)) 90 capsule 1  . meloxicam (MOBIC) 15 MG tablet Take one daily with food for 2 weeks, can take with tylenol, can not take with aleve, iburpofen, then as needed daily for pain 30 tablet 1  . metoprolol succinate (TOPROL-XL) 50 MG 24 hr tablet TAKE 1 TABLET BY MOUTH EVERY DAY *MAX 30 DAYS ON INSURANCE* 90 tablet 1  .  Multiple Vitamin (MULTIVITAMIN) tablet Take 1 tablet by mouth daily.    . Omega-3 Fatty Acids (FISH OIL PO) Take by mouth.    . ondansetron (ZOFRAN-ODT) 4 MG disintegrating tablet Take by mouth.    . predniSONE (DELTASONE) 20 MG tablet 2 tablets daily for 3 days, 1 tablet daily for 4 days. 10 tablet 0  . Vitamin D, Ergocalciferol, (DRISDOL) 50000 UNITS CAPS capsule Take 1 capsule daily or as directed for severe Vitamin D Deficiency 30 capsule 99   No current facility-administered medications for this visit.     Allergies as of 10/24/2016  . (No Known Allergies)    Family History  Problem Relation Age of Onset  . Hypertension Mother   . Barrett's esophagus Mother   . Hypertension Father   . Lung cancer Father   . Thyroid disease Father   . Colon cancer Neg Hx     Social History   Social History  . Marital status: Married    Spouse name: N/A  . Number of children: N/A  . Years of education: N/A   Occupational History  . RN    Social History Main Topics  . Smoking status: Former Smoker    Quit date: 04/18/2012  . Smokeless tobacco: Current User     Comment: E-Cigarretts  . Alcohol use 1.8 oz/week    3 Standard drinks or equivalent per week     Comment: social  . Drug use: No  . Sexual activity: Not on file   Other Topics Concern  . Not on file   Social History Narrative  . No narrative on file    Review of Systems:    Constitutional: No weight loss, fever or chills Skin: No rash  Cardiovascular: No chest pain Respiratory: No SOB Gastrointestinal: See HPI and otherwise negative Genitourinary: No dysuria Neurological: No headache Musculoskeletal: No new muscle or joint pain Hematologic: No bleeding  Psychiatric: No history of depression or anxiety   Physical Exam:  Vital signs: BP 118/80   Pulse 80   Ht '5\' 9"'  (1.753 m)   Wt 185 lb 6.4 oz (84.1 kg)   BMI 27.38 kg/m    Constitutional:   Pleasant Caucasian female appears to be in NAD, Well developed,  Well nourished, alert and cooperative Head:  Normocephalic and atraumatic. Eyes:   PEERL, EOMI. No icterus. Conjunctiva pink. Ears:  Normal auditory acuity. Neck:  Supple Throat: Oral cavity and pharynx without inflammation, swelling or lesion.  Respiratory: Respirations even and unlabored. Lungs clear to auscultation bilaterally.   No wheezes, crackles, or rhonchi.  Cardiovascular: Normal S1, S2. No MRG. Regular rate and rhythm. No peripheral edema, cyanosis or pallor.  Gastrointestinal:  Soft, nondistended, moderate generlaized abdominal ttp. No rebound or guarding. Normal bowel sounds. No appreciable masses or hepatomegaly. Rectal:  Not performed.  Msk:  Symmetrical without gross deformities. Without edema, no deformity or joint abnormality.  Neurologic:  Alert and  oriented x4;  grossly normal neurologically.  Skin:   Dry and intact without significant lesions or rashes. Psychiatric: Demonstrates good judgement and reason without abnormal affect or behaviors.  RELEVANT LABS AND IMAGING: CBC    Component Value Date/Time   WBC 8.5 10/21/2016 0945   RBC 4.95 10/21/2016 0945   HGB 14.5 10/21/2016 0945   HCT 42.8 10/21/2016 0945   PLT 354 10/21/2016 0945   MCV 86.5 10/21/2016 0945   MCH 29.3 10/21/2016 0945   MCHC 33.9 10/21/2016 0945   RDW 12.5 10/21/2016 0945   LYMPHSABS 2,134 10/21/2016 0945   MONOABS 588 05/15/2016 1553   EOSABS 111 10/21/2016 0945   BASOSABS 17 10/21/2016 0945    CMP     Component Value Date/Time   NA 139 10/21/2016 0945   K 4.1 10/21/2016 0945   CL 103 10/21/2016 0945   CO2 27 10/21/2016 0945   GLUCOSE 100 (H) 10/21/2016 0945   BUN 9 10/21/2016 0945   CREATININE 0.69 10/21/2016 0945   CALCIUM 9.5 10/21/2016 0945   PROT 7.0 10/06/2016 1443   ALBUMIN 4.3 05/15/2016 1553   AST 16 10/06/2016 1443   ALT 23 10/06/2016 1443   ALKPHOS 66 05/15/2016 1553   BILITOT 0.5 10/06/2016 1443   GFRNONAA 92 10/21/2016 0945   GFRAA 107 10/21/2016 0945     Assessment: 1. C. difficile diarrhea: Positive on stool testing 10/31/16, patient with at least 10 loose stools per day and abdominal cramping after Augmentin use for sinusitis in September, patient does describe some chronic underlying diarrhea as well, previous to known C.Diff-question underlying IBD vs IBS vs other 2. Abdominal pain: Likely with above  Plan: 1. Discussed with the patient that she has a long history of diarrhea for at least 6 months now, positive for Campylobacter initially, now C. difficile. Question possible underlying IBD, the patient describes no bloody stools. We will go ahead and treat her for C. difficile today, but she may require follow up colonoscopy +/- other investigation once adequately treated. 2. Prescribed Vancomycin 125 mg 4 times daily 10 days. 3. Also prescribed Hyoscyamine sulfate 0.125 mg every 4-6 hours as needed for abdominal cramping #30 with 1 refill 4. Reviewed recommendations for C. difficile. Patient did request a work note for the next week until Wednesday as she feels completely exhausted by her symptoms. 5. Patient to follow in clinic in 3-4 weeks or sooner if necessary with Dr. Carlean Purl or myself. Pending symptoms could provide prolonged taper of Vancomycin versus colonoscopy.  Ellouise Newer, PA-C [facility] 10/24/2016, 10:42 AM  Cc: Unk Pinto, MD   Agree with Ms. Lilyona Richner's evaluation and management.  Gatha Mayer, MD, [facility]

## 2016-10-24 NOTE — Patient Instructions (Signed)
We have sent the following medications to your pharmacy for you to pick up at your convenience:  Vancomycin 125 mg four times a day for 10 days.   Hyoscyamine 0.125 mg every 4-6 hours for abdominal cramping and pain.

## 2016-11-13 ENCOUNTER — Telehealth: Payer: Self-pay

## 2016-11-13 ENCOUNTER — Other Ambulatory Visit: Payer: Self-pay | Admitting: Internal Medicine

## 2016-11-13 DIAGNOSIS — F411 Generalized anxiety disorder: Secondary | ICD-10-CM

## 2016-11-13 MED ORDER — ALPRAZOLAM 1 MG PO TABS: ORAL_TABLET | ORAL | 0 refills | Status: DC

## 2016-11-13 NOTE — Telephone Encounter (Signed)
Dr Ardis Hughs this was an error, [place] clicked the wrong pt, you have already did the procedure on the correct pt.Marland Kitchen

## 2016-11-13 NOTE — Telephone Encounter (Signed)
Dr Jacobs please advise  

## 2016-11-13 NOTE — Telephone Encounter (Signed)
-----   Message from Wyline Beady, [place] sent at 10/24/2016  4:21 PM EDT ----- Chong Sicilian,   Please schedule patient for and EUS with Dr. Ardis Hughs sometime after 12/04/16. Thank you!!

## 2016-11-13 NOTE — Telephone Encounter (Signed)
Please call Alpraz  

## 2016-11-18 ENCOUNTER — Encounter (INDEPENDENT_AMBULATORY_CARE_PROVIDER_SITE_OTHER): Payer: Self-pay

## 2016-11-19 ENCOUNTER — Other Ambulatory Visit: Payer: Self-pay | Admitting: Internal Medicine

## 2016-11-19 DIAGNOSIS — F411 Generalized anxiety disorder: Secondary | ICD-10-CM

## 2016-11-19 NOTE — Telephone Encounter (Signed)
Please call Alpraz  

## 2016-12-03 ENCOUNTER — Ambulatory Visit: Payer: Commercial Managed Care - PPO | Admitting: Internal Medicine

## 2016-12-03 ENCOUNTER — Encounter: Payer: Self-pay | Admitting: Internal Medicine

## 2016-12-03 VITALS — BP 108/80 | HR 68 | Ht 68.25 in | Wt 184.5 lb

## 2016-12-03 DIAGNOSIS — A0472 Enterocolitis due to Clostridium difficile, not specified as recurrent: Secondary | ICD-10-CM

## 2016-12-03 NOTE — Patient Instructions (Signed)
  Please purchase Florastor over the counter and take it daily for a month.   We are giving you information to read on C. Difficile.   I appreciate the opportunity to care for you. Silvano Rusk, MD, [facility]

## 2016-12-03 NOTE — Progress Notes (Signed)
Taylor Li 66 y.o. 10/07/1950 [id number]  Assessment & Plan:   Encounter Diagnosis  Name Primary?  . C. difficile colitis - resolved Yes   She seems to be resolved at this point.  For the future she should take a months worth of Florastor to 50 mg daily.  If she needs antibiotics and should be cautious about the use of those I would take Florastor during.  If she recurs would need a long taper and consideration for fecal microbiotica  transplant.  I did explain that it takes time to return to normal after a severe gastrointestinal illness like this and she may never had the same bowel habits she had in the past.  I appreciate the opportunity to care for this patient.  CC: Unk Pinto, MD  Subjective:   Chief Complaint: Follow-up after C. difficile colitis HPI The patient is here for follow-up.  She was seen by Ellouise Newer after seeing Unk Pinto, MD and was found to have C diff + PCR. Tx vancomycin and no sig diarrhea though not back to NL.    No Known Allergies Current Meds  Medication Sig  . ALPRAZolam (XANAX) 1 MG tablet TAKE 1/2 TO 1 TABLET 3 TIMES DAILY AS NEEDED (Patient taking differently: TAKE 1 TABLET AT NIGHT AS NEEDED)  . Ascorbic Acid (VITAMIN C) 1000 MG tablet Take 1,000 mg by mouth daily.  Marland Kitchen aspirin 81 MG tablet Take 81 mg by mouth daily.  Marland Kitchen buPROPion (WELLBUTRIN XL) 300 MG 24 hr tablet TAKE 1 TABLET BY MOUTH EVERY DAY  . citalopram (CELEXA) 40 MG tablet Take 40 mg by mouth daily.  . cyclobenzaprine (FLEXERIL) 10 MG tablet TAKE 1/2 TO 1 TABLET BY MOUTH 3 TIMES A DAY AS NEEDED AS NEEDED FOR MUSCLE SPASM (Patient taking differently: TAKE 1 TABLET BY MOUTH AtT NIGHT AS NEEDED AS NEEDED FOR MUSCLE SPASM)  . metoprolol succinate (TOPROL-XL) 50 MG 24 hr tablet TAKE 1 TABLET BY MOUTH EVERY DAY *MAX 30 DAYS ON INSURANCE*  . Multiple Vitamin (MULTIVITAMIN) tablet Take 1 tablet by mouth daily.  . Omega-3 Fatty Acids (FISH OIL PO) Take by mouth.  .  Probiotic Product (PROBIOTIC PO) Take 1 capsule by mouth daily.  . Vitamin D, Ergocalciferol, (DRISDOL) 50000 UNITS CAPS capsule Take 1 capsule daily or as directed for severe Vitamin D Deficiency   Past Medical History:  Diagnosis Date  . A-fib ([place])   . Arthritis   . C. difficile diarrhea   . Hyperlipidemia   . Hypertension   . Personal history of colonic polyp- adenoma 09/22/2013  . Pre-diabetes   . Vitamin D deficiency    Past Surgical History:  Procedure Laterality Date  . APPENDECTOMY    . BREAST SURGERY Bilateral 1983   SQ mastectomies  . CARDIAC DEFIBRILLATOR PLACEMENT    . CARDIOVERSION    . CHOLECYSTECTOMY    . COLONOSCOPY    . LAMINECTOMY  1982   L4-L5  . NASAL SEPTUM SURGERY    . TONSILLECTOMY    . VAGINAL HYSTERECTOMY  1985   Social History   Social History Narrative   Married, Therapist, sports   Former tobacco smoker now Patent attorney   + EtOH and caffeine   No drugs   Review of Systems As above  Objective:   Physical Exam BP 108/80 (BP Location: Left Arm, Patient Position: Sitting, Cuff Size: Normal)   Pulse 68   Ht 5' 8.25" (1.734 m) Comment: height measured without shoes  Wt 184 lb 8  oz (83.7 kg)   BMI 27.85 kg/m   no apparent distress

## 2016-12-08 ENCOUNTER — Ambulatory Visit: Payer: Commercial Managed Care - PPO | Admitting: Internal Medicine

## 2016-12-08 ENCOUNTER — Encounter: Payer: Self-pay | Admitting: Internal Medicine

## 2016-12-08 VITALS — BP 122/82 | HR 64 | Temp 97.3°F | Resp 16 | Ht 68.5 in | Wt 189.8 lb

## 2016-12-08 DIAGNOSIS — R7303 Prediabetes: Secondary | ICD-10-CM | POA: Diagnosis not present

## 2016-12-08 DIAGNOSIS — Z79899 Other long term (current) drug therapy: Secondary | ICD-10-CM

## 2016-12-08 DIAGNOSIS — E782 Mixed hyperlipidemia: Secondary | ICD-10-CM | POA: Diagnosis not present

## 2016-12-08 DIAGNOSIS — I10 Essential (primary) hypertension: Secondary | ICD-10-CM | POA: Diagnosis not present

## 2016-12-08 DIAGNOSIS — E559 Vitamin D deficiency, unspecified: Secondary | ICD-10-CM | POA: Diagnosis not present

## 2016-12-08 NOTE — Patient Instructions (Signed)

## 2016-12-08 NOTE — Progress Notes (Signed)
This very nice 66 y.o. MWF presents for 6 month follow up with Hypertension, Hyperlipidemia, Pre-Diabetes and Vitamin D Deficiency.  Patient was recently by Dr Carlean Purl for C.diff w/po Vancomycin which initially began with a bout of Campylobacter Gastroenteritis treated with Cipro /Flagyl.      Patient is treated for labile HTN (2002)  & BP has been controlled at home. She has hx/o pAfib (2002/2004)  S/p CV and a negative Cardiolite in 2011. Today's BP is at goal - 122/82. Patient has had no complaints of any cardiac type chest pain, palpitations, dyspnea / orthopnea / PND, dizziness, claudication, or dependent edema.     Hyperlipidemia is controlled with diet & meds. Patient denies myalgias or other med SE's. Last Lipids were  Lab Results  Component Value Date   CHOL 199 09/04/2016   HDL 89 09/04/2016   LDLCALC 122 (H) 05/15/2016   TRIG 117 09/04/2016   CHOLHDL 2.2 09/04/2016      Also, the patient has history of PreDiabetes (A1c 5.8% / 2011 and 5.9% / 2011) and has had no symptoms of reactive hypoglycemia, diabetic polys, paresthesias or visual blurring.  Last A1c was normal & at goal: Lab Results  Component Value Date   HGBA1C 5.4 09/04/2016      Further, the patient also has history of Vitamin D Deficiency ("36" / 2010)  and supplements vitamin D without any suspected side-effects. Last vitamin D was still very low (goal 70-100):  Lab Results  Component Value Date   VD25OH 29 (L) 05/15/2016   Current Outpatient Medications on File Prior to Visit  Medication Sig  . ALPRAZolam (XANAX) 1 MG tablet TAKE 1/2 TO 1 TABLET 3 TIMES DAILY AS NEEDED (Patient taking differently: TAKE 1 TABLET AT NIGHT AS NEEDED)  . Ascorbic Acid (VITAMIN C) 1000 MG tablet Take 1,000 mg by mouth daily.  Marland Kitchen aspirin 81 MG tablet Take 81 mg by mouth daily.  Marland Kitchen buPROPion (WELLBUTRIN XL) 300 MG 24 hr tablet TAKE 1 TABLET BY MOUTH EVERY DAY  . Cholecalciferol (VITAMIN D PO) Take 5,000 Units by mouth daily.  .  citalopram (CELEXA) 40 MG tablet Take 40 mg by mouth daily.  . cyclobenzaprine (FLEXERIL) 10 MG tablet TAKE 1/2 TO 1 TABLET BY MOUTH 3 TIMES A DAY AS NEEDED AS NEEDED FOR MUSCLE SPASM (Patient taking differently: TAKE 1 TABLET BY MOUTH AtT NIGHT AS NEEDED AS NEEDED FOR MUSCLE SPASM)  . metoprolol succinate (TOPROL-XL) 50 MG 24 hr tablet TAKE 1 TABLET BY MOUTH EVERY DAY *MAX 30 DAYS ON INSURANCE*  . Multiple Vitamin (MULTIVITAMIN) tablet Take 1 tablet by mouth daily.  . Probiotic Product (PROBIOTIC PO) Take 1 capsule by mouth daily.   No current facility-administered medications on file prior to visit.    No Known Allergies PMHx:   Past Medical History:  Diagnosis Date  . A-fib ([place])   . Arthritis   . C. difficile diarrhea   . Hyperlipidemia   . Hypertension   . Personal history of colonic polyp- adenoma 09/22/2013  . Pre-diabetes   . Vitamin D deficiency    Immunization History  Administered Date(s) Administered  . DTaP 01/13/2001  . Influenza Inj Mdck Quad With Preservative 09/30/2016  . Influenza Whole 10/06/2012  . Influenza,inj,quad, With Preservative 10/18/2015  . Influenza-Unspecified 11/11/2013, 11/15/2014  . PPD Test 01/14/2013, 04/17/2014, 04/16/2015, 05/15/2016  . Tdap 10/18/2015  . Zoster 11/19/2009   Past Surgical History:  Procedure Laterality Date  . APPENDECTOMY    .  BREAST SURGERY Bilateral 1983   SQ mastectomies  . CARDIAC DEFIBRILLATOR PLACEMENT    . CARDIOVERSION    . CHOLECYSTECTOMY    . COLONOSCOPY    . LAMINECTOMY  1982   L4-L5  . NASAL SEPTUM SURGERY    . TONSILLECTOMY    . VAGINAL HYSTERECTOMY  1985   FHx:    Reviewed / unchanged  SHx:    Reviewed / unchanged  Systems Review:  Constitutional: Denies fever, chills, wt changes, headaches, insomnia, fatigue, night sweats, change in appetite. Eyes: Denies redness, blurred vision, diplopia, discharge, itchy, watery eyes.  ENT: Denies discharge, congestion, post nasal drip, epistaxis, sore  throat, earache, hearing loss, dental pain, tinnitus, vertigo, sinus pain, snoring.  CV: Denies chest pain, palpitations, irregular heartbeat, syncope, dyspnea, diaphoresis, orthopnea, PND, claudication or edema. Respiratory: denies cough, dyspnea, DOE, pleurisy, hoarseness, laryngitis, wheezing.  Gastrointestinal: Denies dysphagia, odynophagia, heartburn, reflux, water brash, abdominal pain or cramps, nausea, vomiting, bloating, diarrhea, constipation, hematemesis, melena, hematochezia  or hemorrhoids. Genitourinary: Denies dysuria, frequency, urgency, nocturia, hesitancy, discharge, hematuria or flank pain. Musculoskeletal: Denies arthralgias, myalgias, stiffness, jt. swelling, pain, limping or strain/sprain.  Skin: Denies pruritus, rash, hives, warts, acne, eczema or change in skin lesion(s). Neuro: No weakness, tremor, incoordination, spasms, paresthesia or pain. Psychiatric: Denies confusion, memory loss or sensory loss. Endo: Denies change in weight, skin or hair change.  Heme/Lymph: No excessive bleeding, bruising or enlarged lymph nodes.  Physical Exam  BP 122/82   Pulse 64   Temp (!) 97.3 F (36.3 C)   Resp 16   Ht 5' 8.5" (1.74 m)   Wt 189 lb 12.8 oz (86.1 kg)   BMI 28.44 kg/m   Appears well nourished, well groomed  and in no distress.  Eyes: PERRLA, EOMs, conjunctiva no swelling or erythema. Sinuses: No frontal/maxillary tenderness ENT/Mouth: EAC's clear, TM's nl w/o erythema, bulging. Nares clear w/o erythema, swelling, exudates. Oropharynx clear without erythema or exudates. Oral hygiene is good. Tongue normal, non obstructing. Hearing intact.  Neck: Supple. Thyroid nl. Car 2+/2+ without bruits, nodes or JVD. Chest: Respirations nl with BS clear & equal w/o rales, rhonchi, wheezing or stridor.  Cor: Heart sounds normal w/ regular rate and rhythm without sig. murmurs, gallops, clicks or rubs. Peripheral pulses normal and equal  without edema.  Abdomen: Soft & bowel sounds  normal. Non-tender w/o guarding, rebound, hernias, masses or organomegaly.  Lymphatics: Unremarkable.  Musculoskeletal: Full ROM all peripheral extremities, joint stability, 5/5 strength and normal gait.  Skin: Warm, dry without exposed rashes, lesions or ecchymosis apparent.  Neuro: Cranial nerves intact, reflexes equal bilaterally. Sensory-motor testing grossly intact. Tendon reflexes grossly intact.  Pysch: Alert & oriented x 3.  Insight and judgement nl & appropriate. No ideations.  Assessment and Plan:  1. Essential hypertension  - Continue medication, monitor blood pressure at home.  - Continue DASH diet. Reminder to go to the ER if any CP,  SOB, nausea, dizziness, severe HA, changes vision/speech. - CBC with Differential/Platelet - BASIC METABOLIC PANEL WITH GFR - Magnesium - TSH  2. Hyperlipidemia, mixed  - Continue diet/meds, exercise,& lifestyle modifications.  - Continue monitor periodic cholesterol/liver & renal functions  - Hepatic function panel - Lipid panel - TSH  3. Prediabetes  - Continue diet, exercise, lifestyle modifications.  - Monitor appropriate labs.  - Hemoglobin A1c - Insulin, random  4. Vitamin D deficiency  - Continue supplementation.  - VITAMIN D 25 Hydroxy   5. Medication management  - CBC with Differential/Platelet -  BASIC METABOLIC PANEL WITH GFR - Hepatic function panel - Magnesium - Lipid panel - TSH - Hemoglobin A1c - Insulin, random - VITAMIN D 25 Hydroxy         Discussed  regular exercise, BP monitoring, weight control to achieve/maintain BMI less than 25 and discussed med and SE's. Recommended labs to assess and monitor clinical status with further disposition pending results of labs. Over 30 minutes of exam, counseling, chart review was performed.

## 2016-12-09 LAB — CBC WITH DIFFERENTIAL/PLATELET
BASOS ABS: 20 {cells}/uL (ref 0–200)
BASOS PCT: 0.3 %
EOS PCT: 2 %
Eosinophils Absolute: 132 cells/uL (ref 15–500)
HCT: 39.7 % (ref 35.0–45.0)
Hemoglobin: 13.4 g/dL (ref 11.7–15.5)
LYMPHS ABS: 2303 {cells}/uL (ref 850–3900)
MCH: 29.3 pg (ref 27.0–33.0)
MCHC: 33.8 g/dL (ref 32.0–36.0)
MCV: 86.7 fL (ref 80.0–100.0)
MPV: 10.9 fL (ref 7.5–12.5)
Monocytes Relative: 7.2 %
NEUTROS ABS: 3670 {cells}/uL (ref 1500–7800)
NEUTROS PCT: 55.6 %
PLATELETS: 252 10*3/uL (ref 140–400)
RBC: 4.58 10*6/uL (ref 3.80–5.10)
RDW: 12 % (ref 11.0–15.0)
Total Lymphocyte: 34.9 %
WBC: 6.6 10*3/uL (ref 3.8–10.8)
WBCMIX: 475 {cells}/uL (ref 200–950)

## 2016-12-09 LAB — HEPATIC FUNCTION PANEL
AG RATIO: 1.6 (calc) (ref 1.0–2.5)
ALBUMIN MSPROF: 4 g/dL (ref 3.6–5.1)
ALT: 21 U/L (ref 6–29)
AST: 17 U/L (ref 10–35)
Alkaline phosphatase (APISO): 66 U/L (ref 33–130)
BILIRUBIN DIRECT: 0.1 mg/dL (ref 0.0–0.2)
BILIRUBIN TOTAL: 0.5 mg/dL (ref 0.2–1.2)
GLOBULIN: 2.5 g/dL (ref 1.9–3.7)
Indirect Bilirubin: 0.4 mg/dL (calc) (ref 0.2–1.2)
Total Protein: 6.5 g/dL (ref 6.1–8.1)

## 2016-12-09 LAB — MAGNESIUM: MAGNESIUM: 1.9 mg/dL (ref 1.5–2.5)

## 2016-12-09 LAB — BASIC METABOLIC PANEL WITH GFR
BUN: 12 mg/dL (ref 7–25)
CALCIUM: 9.2 mg/dL (ref 8.6–10.4)
CO2: 30 mmol/L (ref 20–32)
Chloride: 105 mmol/L (ref 98–110)
Creat: 0.62 mg/dL (ref 0.50–0.99)
GFR, EST AFRICAN AMERICAN: 110 mL/min/{1.73_m2} (ref >=60)
GFR, Est Non African American: 95 mL/min/{1.73_m2} (ref >=60)
GLUCOSE: 82 mg/dL (ref 65–99)
Potassium: 4.1 mmol/L (ref 3.5–5.3)
Sodium: 140 mmol/L (ref 135–146)

## 2016-12-09 LAB — LIPID PANEL
CHOL/HDL RATIO: 2.6 (calc) (ref <5.0)
Cholesterol: 192 mg/dL (ref <200)
HDL: 73 mg/dL (ref >50)
LDL CHOLESTEROL (CALC): 96 mg/dL
NON-HDL CHOLESTEROL (CALC): 119 mg/dL (ref <130)
TRIGLYCERIDES: 126 mg/dL (ref <150)

## 2016-12-09 LAB — HEMOGLOBIN A1C
Hgb A1c MFr Bld: 5.5 % of total Hgb (ref <5.7)
Mean Plasma Glucose: 111 (calc)
eAG (mmol/L): 6.2 (calc)

## 2016-12-09 LAB — INSULIN, RANDOM: INSULIN: 5.5 u[IU]/mL (ref 2.0–19.6)

## 2016-12-09 LAB — VITAMIN D 25 HYDROXY (VIT D DEFICIENCY, FRACTURES): Vit D, 25-Hydroxy: 29 ng/mL — ABNORMAL LOW (ref 30–100)

## 2016-12-09 LAB — TSH: TSH: 1.12 m[IU]/L (ref 0.40–4.50)

## 2016-12-17 ENCOUNTER — Telehealth: Payer: Self-pay | Admitting: Internal Medicine

## 2016-12-17 ENCOUNTER — Encounter (INDEPENDENT_AMBULATORY_CARE_PROVIDER_SITE_OTHER): Payer: Self-pay

## 2016-12-17 NOTE — Telephone Encounter (Signed)
Spoke with [name] and she said maybe she "jumped the gun".  No fever, one episode of diarrhea yesterday and one episode this AM.

## 2016-12-17 NOTE — Telephone Encounter (Signed)
Patient states she is having cdiff symptoms again and wants to know if Dr.Gessner can go ahead and call in medication vancomycin again

## 2016-12-18 NOTE — Telephone Encounter (Signed)
Left message to call me back with an update.

## 2016-12-18 NOTE — Telephone Encounter (Signed)
Let's get an update by tomorrow

## 2016-12-18 NOTE — Telephone Encounter (Signed)
Taylor Li said she had mostly diarrhea last night but that she ate fatty foods that she shouldn't have eaten. No pain, no fever, although said she had low grade over the weekend but she thinks that was from a cold she's getting.  Today normal stool so far.

## 2016-12-21 ENCOUNTER — Other Ambulatory Visit: Payer: Self-pay | Admitting: Internal Medicine

## 2016-12-30 ENCOUNTER — Encounter (INDEPENDENT_AMBULATORY_CARE_PROVIDER_SITE_OTHER): Payer: Self-pay

## 2017-03-10 DIAGNOSIS — E663 Overweight: Secondary | ICD-10-CM | POA: Insufficient documentation

## 2017-03-10 NOTE — Progress Notes (Signed)
FOLLOW UP  Assessment and Plan:   Atrial fib ([place]) No known episodes after CV 2002/2004 Normal rate and rhythm today Continue follow up cardiology as recommended  Hypertension Well controlled with current medications  Monitor blood pressure at home; patient to call if consistently greater than 130/80 Continue DASH diet.   Reminder to go to the ER if any CP, SOB, nausea, dizziness, severe HA, changes vision/speech, left arm numbness and tingling and jaw pain.  Cholesterol Currently above goal with lifestyle modification only Continue low cholesterol diet and exercise.  Check lipid panel.   Other abnormal glucose Recent A1Cs at goal Continue diet and exercise.  Perform daily foot/skin check, notify office of any concerning changes.  Defer A1C; check BMP  Overweight Long discussion about weight loss, diet, and exercise Recommended diet heavy in fruits and veggies and low in animal meats, cheeses, and dairy products, appropriate calorie intake Discussed ideal weight for height - goal weight for next visit 190 lb Patient will work on  Will follow up in 3 months  Depression/anxiety Continue medications ; currently tapering celexa - taking 20 mg - continue slow taper to D/C, continue with wellbutrin and PRN xanax - has decreased use significantly. Encouraged to avoid daily use if possible.  Lifestyle discussed: diet/exerise, sleep hygiene, stress management, hydration  Vitamin D Def/ osteoporosis prevention Well below goal at last check; continue to recommend supplementation for goal of 70-100 Check vitamin D level  Continue diet and meds as discussed. Further disposition pending results of labs. Discussed med's effects and SE's.   Over 30 minutes of exam, counseling, chart review, and critical decision making was performed.   Future Appointments  Date Time Provider [place]  06/15/2017  2:00 PM Unk Pinto, MD [facility] None     ----------------------------------------------------------------------------------------------------------------------  HPI 67 y.o. female  presents for 3 month follow up on hypertension, cholesterol, diabetes, weight and vitamin D deficiency.  She has hx/o pAfib (2002/2004)  S/p CV and a negative Cardiolite in 2011 without further known episodes.   she has a diagnosis of depression/anxiety and is currently on celexa 40 mg daily and xanax 1 mg TID PRN, reports symptoms are well controlled on current regimen. she reports she takes 1 at night, but hasn't needed during the day these days. She does have concerns about decreased libido ongoing for the past year; discussed this at length, full d/c of celexa may help, discussed other strategies at length as well.   BMI is Body mass index is 29.22 kg/m., she has not been working on diet and exercise but plans to start soon. Plans to start working out with her husband.  Wt Readings from Last 3 Encounters:  03/11/17 195 lb (88.5 kg)  12/08/16 189 lb 12.8 oz (86.1 kg)  12/03/16 184 lb 8 oz (83.7 kg)   Her blood pressure has been controlled at home, today their BP is BP: 98/64  She does not workout. She denies chest pain, shortness of breath, dizziness.   She is not on cholesterol medication and denies myalgias. Her cholesterol is not at goal. The cholesterol last visit was:   Lab Results  Component Value Date   CHOL 192 12/08/2016   HDL 73 12/08/2016   LDLCALC 122 (H) 05/15/2016   TRIG 126 12/08/2016   CHOLHDL 2.6 12/08/2016    She has not been working on diet and exercise for glucose management, and denies increased appetite, nausea, paresthesia of the feet, polydipsia, polyuria, visual disturbances, vomiting and weight loss. Last  A1C in the office was:  Lab Results  Component Value Date   HGBA1C 5.5 12/08/2016   Patient is on Vitamin D supplement but well below goal at most recent check:    Lab Results  Component Value Date   VD25OH 29  (L) 12/08/2016    She reports she has restarted supplement since that time.     Current Medications:  Current Outpatient Medications on File Prior to Visit  Medication Sig  . ALPRAZolam (XANAX) 1 MG tablet TAKE 1/2 TO 1 TABLET 3 TIMES DAILY AS NEEDED (Patient taking differently: TAKE 1 TABLET AT NIGHT AS NEEDED)  . Ascorbic Acid (VITAMIN C) 1000 MG tablet Take 1,000 mg by mouth daily.  Marland Kitchen aspirin 81 MG tablet Take 81 mg by mouth daily.  Marland Kitchen BLACK PEPPER-TURMERIC PO Take by mouth.  Marland Kitchen buPROPion (WELLBUTRIN XL) 300 MG 24 hr tablet TAKE 1 TABLET BY MOUTH EVERY DAY  . Cholecalciferol (VITAMIN D PO) Take 5,000 Units by mouth daily.  . citalopram (CELEXA) 40 MG tablet TAKE 1 TABLET BY MOUTH EVERY DAY  . cyclobenzaprine (FLEXERIL) 10 MG tablet TAKE 1/2 TO 1 TABLET BY MOUTH 3 TIMES A DAY AS NEEDED AS NEEDED FOR MUSCLE SPASM (Patient taking differently: TAKE 1 TABLET BY MOUTH AtT NIGHT AS NEEDED AS NEEDED FOR MUSCLE SPASM)  . fexofenadine (ALLEGRA) 180 MG tablet Take 180 mg by mouth daily.  . metoprolol succinate (TOPROL-XL) 50 MG 24 hr tablet TAKE 1 TABLET BY MOUTH EVERY DAY *MAX 30 DAYS ON INSURANCE*  . Multiple Vitamin (MULTIVITAMIN) tablet Take 1 tablet by mouth daily.  . Probiotic Product (PROBIOTIC PO) Take 1 capsule by mouth as needed.   Marland Kitchen VITAMIN E PO Take by mouth.   No current facility-administered medications on file prior to visit.      Allergies: No Known Allergies   Medical History:  Past Medical History:  Diagnosis Date  . A-fib ([place])   . Arthritis   . C. difficile diarrhea   . Hyperlipidemia   . Hypertension   . Personal history of colonic polyp- adenoma 09/22/2013  . Pre-diabetes   . Vitamin D deficiency    Family history- Reviewed and unchanged Social history- Reviewed and unchanged   Review of Systems:  Review of Systems  Constitutional: Negative for malaise/fatigue and weight loss.  HENT: Negative for hearing loss and tinnitus.   Eyes: Negative for blurred vision  and double vision.  Respiratory: Negative for cough, shortness of breath and wheezing.   Cardiovascular: Negative for chest pain, palpitations, orthopnea, claudication and leg swelling.  Gastrointestinal: Negative for abdominal pain, blood in stool, constipation, diarrhea, heartburn, melena, nausea and vomiting.  Genitourinary: Negative.   Musculoskeletal: Negative for joint pain and myalgias.  Skin: Negative for rash.  Neurological: Negative for dizziness, tingling, sensory change, weakness and headaches.  Endo/Heme/Allergies: Negative for polydipsia.  Psychiatric/Behavioral: Negative for depression, memory loss, substance abuse and suicidal ideas. The patient is nervous/anxious (Rare) and has insomnia (Difficulty falling sleep without xanax).   All other systems reviewed and are negative.   Physical Exam: BP 98/64   Pulse 65   Temp 97.9 F (36.6 C)   Wt 195 lb (88.5 kg)   SpO2 98%   BMI 29.22 kg/m  Wt Readings from Last 3 Encounters:  03/11/17 195 lb (88.5 kg)  12/08/16 189 lb 12.8 oz (86.1 kg)  12/03/16 184 lb 8 oz (83.7 kg)   General Appearance: Well nourished, in no apparent distress. Eyes: PERRLA, EOMs, conjunctiva no swelling  or erythema Sinuses: No Frontal/maxillary tenderness ENT/Mouth: Ext aud canals clear, TMs without erythema, bulging. No erythema, swelling, or exudate on post pharynx.  Tonsils not swollen or erythematous. Hearing normal.  Neck: Supple, thyroid normal.  Respiratory: Respiratory effort normal, BS equal bilaterally without rales, rhonchi, wheezing or stridor.  Cardio: RRR with no MRGs. Brisk peripheral pulses without edema.  Abdomen: Soft, + BS.  Non tender, no guarding, rebound, hernias, masses. Lymphatics: Non tender without lymphadenopathy.  Musculoskeletal: Full ROM, 5/5 strength, Normal gait Skin: Warm, dry without rashes, lesions, ecchymosis.  Neuro: Cranial nerves intact. No cerebellar symptoms.  Psych: Awake and oriented X 3, normal affect,  Insight and Judgment appropriate.    Izora Ribas, NP 2:45 PM [facility]

## 2017-03-11 ENCOUNTER — Encounter: Payer: Self-pay | Admitting: Adult Health

## 2017-03-11 ENCOUNTER — Ambulatory Visit: Payer: Commercial Managed Care - PPO | Admitting: Adult Health

## 2017-03-11 VITALS — BP 98/64 | HR 65 | Temp 97.9°F | Wt 195.0 lb

## 2017-03-11 DIAGNOSIS — F32A Depression, unspecified: Secondary | ICD-10-CM

## 2017-03-11 DIAGNOSIS — I10 Essential (primary) hypertension: Secondary | ICD-10-CM

## 2017-03-11 DIAGNOSIS — F419 Anxiety disorder, unspecified: Secondary | ICD-10-CM

## 2017-03-11 DIAGNOSIS — E663 Overweight: Secondary | ICD-10-CM

## 2017-03-11 DIAGNOSIS — Z79899 Other long term (current) drug therapy: Secondary | ICD-10-CM | POA: Diagnosis not present

## 2017-03-11 DIAGNOSIS — E782 Mixed hyperlipidemia: Secondary | ICD-10-CM

## 2017-03-11 DIAGNOSIS — I480 Paroxysmal atrial fibrillation: Secondary | ICD-10-CM | POA: Diagnosis not present

## 2017-03-11 DIAGNOSIS — E559 Vitamin D deficiency, unspecified: Secondary | ICD-10-CM | POA: Diagnosis not present

## 2017-03-11 DIAGNOSIS — F329 Major depressive disorder, single episode, unspecified: Secondary | ICD-10-CM | POA: Diagnosis not present

## 2017-03-11 DIAGNOSIS — R7309 Other abnormal glucose: Secondary | ICD-10-CM

## 2017-03-11 NOTE — Patient Instructions (Signed)
Cut celexa to 1/4 tab (10 mg) for two weeks then can stop-  Can try cutting back on wellbutrin to 150 mg when ready to do so - call and let us know and we can send in the lower dose   8 Critical Weight-Loss Tips That Aren't Diet and Exercise  1. STARVE THE DISTRACTIONS  All too often when we eat, we're also multitasking: watching TV, answering emails, scrolling through social media. These habits are detrimental to having a strong, clear, healthy relationship with food, and they can hinder our ability to make dietary changes.  In order to truly focus on what you're eating, how much you're eating, why you're eating those specific foods and, most importantly, how those foods make you feel, you need to starve the distractions. That means when you eat, just eat. Focus on your food, the process it went through to end up on your plate, where it came from and how it nourishes you. With this technique, you're more likely to finish a meal feeling satiated.  2.  CONSIDER WHAT YOU'RE NOT WILLING TO DO  This might sound counterintuitive, but it can help provide a "why" when motivation is waning. Declare, in writing, what you are unwilling to do, for example "I am unwilling to be the old dad who cannot play sports with my children".  So consider what you're not willing to accept, write it down, and keep it at the ready.  3.  STOP LABELING FOOD "GOOD" AND "BAD"  You've probably heard someone say they ate something "bad." Maybe you've even said it yourself.  The trouble with 'bad' foods isn't that they'll send you to the grave after a bite or two. The trouble comes when we eat excessive portions of really calorie-dense foods meal after meal, day after day.  Instead of labeling foods as good or bad, think about which foods you can eat a lot of, and which ones you should just eat a little of. Then, plan ways to eat the foods you really like in portions that fit with your overall goals. A good example of this  would be having a slice of pizza alongside a club salad with chicken breast, avocado and a bit of dressing. This is vastly different than 3 slices of pizza, 4 breadsticks with cheese sauce and half of a liter of regular soda.  4.  BRUSH YOUR TEETH AFTER YOU EAT  Getting your mindset in order is important, but sometimes small habits can make a big difference. After eating, you still have the taste of food in their mouth, which often causes people to eat more even if they are full or engage in a nibble or two of dessert.  Brushing your teeth will remove the taste of food from your mouth, and the clean, minty freshness will serve as a cue that mealtime is over.  5.  FOCUS ON CROWDING NOT CUTTING  The most common first step during 'dieting' is to cut. We cut our portion sizes down, we cut out 'bad' foods, we cut out entire food groups. This act of cutting puts [place] and our minds into scarcity mode.  When something is off-limits, even if you're able to avoid it for a while, you could end up bingeing on it later because you've gone so long without it. So, instead of cutting, focus on crowding. If you crowd your plate and fill it up with more foods like veggies and protein, it simply allows less room for the other stuff.  In other words, shift your focus away from what you can't eat, and celebrate the foods that will help you reach your goals.  6.  TAKE TRACKING A STEP FURTHER  Track what you eat, when you ate it, how much you ate and how that food made you feel. Being completely honest with yourself and writing down every single thing that passes through your lips will help you start to notice that maybe you actually do snack, possibly take in more sugar than you thought, eat when you're bored rather than just hungry or maybe that you have a habit of snacking before bed while watching TV.  The difference from simply tracking your food intake is you're taking into account how food makes you feel, as well as  what you're doing while you're eating. This is about becoming more mindful of what, when and why you eat.  7.  PRIORITIZE GOOD SLEEP  One of the strongest risk factors for being overweight is poor sleep. When you're feeling tired, you're more likely to choose unhealthy comfort foods and to skip your workout. Additionally, sleep deprivation may slow down your metabolism. [employer]! Therefore, sleeping 7-8 hours per night can help with weight loss without having to change your diet or increase your physical activity. And if you feel you snore and still wake up tired, talk with me about sleep apnea.  8.  SET ASIDE TIME TO DISCONNECT  Just get out there. Disconnect from the electronics and connect to the elements. Not only will this help reduce stress (a major factor in weight gain) by giving your mind a break from the constant stimulation we've all become so accustomed to, but it may also reprogram your brain to connect with yourself and what you're feeling.

## 2017-03-12 LAB — CBC WITH DIFFERENTIAL/PLATELET
BASOS ABS: 32 {cells}/uL (ref 0–200)
BASOS PCT: 0.4 %
EOS ABS: 142 {cells}/uL (ref 15–500)
Eosinophils Relative: 1.8 %
HCT: 40.4 % (ref 35.0–45.0)
HEMOGLOBIN: 14.1 g/dL (ref 11.7–15.5)
Lymphs Abs: 2647 cells/uL (ref 850–3900)
MCH: 30.2 pg (ref 27.0–33.0)
MCHC: 34.9 g/dL (ref 32.0–36.0)
MCV: 86.5 fL (ref 80.0–100.0)
MPV: 10.8 fL (ref 7.5–12.5)
Monocytes Relative: 7.7 %
Neutro Abs: 4471 cells/uL (ref 1500–7800)
Neutrophils Relative %: 56.6 %
Platelets: 259 10*3/uL (ref 140–400)
RBC: 4.67 10*6/uL (ref 3.80–5.10)
RDW: 12.7 % (ref 11.0–15.0)
Total Lymphocyte: 33.5 %
WBC: 7.9 10*3/uL (ref 3.8–10.8)
WBCMIX: 608 {cells}/uL (ref 200–950)

## 2017-03-12 LAB — BASIC METABOLIC PANEL WITH GFR
BUN: 14 mg/dL (ref 7–25)
CO2: 29 mmol/L (ref 20–32)
CREATININE: 0.66 mg/dL (ref 0.50–0.99)
Calcium: 9.7 mg/dL (ref 8.6–10.4)
Chloride: 103 mmol/L (ref 98–110)
GFR, EST AFRICAN AMERICAN: 108 mL/min/{1.73_m2} (ref >=60)
GFR, Est Non African American: 93 mL/min/{1.73_m2} (ref >=60)
Glucose, Bld: 87 mg/dL (ref 65–99)
Potassium: 4.5 mmol/L (ref 3.5–5.3)
Sodium: 137 mmol/L (ref 135–146)

## 2017-03-12 LAB — TSH: TSH: 0.99 mIU/L (ref 0.40–4.50)

## 2017-03-12 LAB — HEPATIC FUNCTION PANEL
AG RATIO: 1.6 (calc) (ref 1.0–2.5)
ALT: 21 U/L (ref 6–29)
AST: 16 U/L (ref 10–35)
Albumin: 4.4 g/dL (ref 3.6–5.1)
Alkaline phosphatase (APISO): 71 U/L (ref 33–130)
BILIRUBIN DIRECT: 0.1 mg/dL (ref 0.0–0.2)
BILIRUBIN INDIRECT: 0.6 mg/dL (ref 0.2–1.2)
GLOBULIN: 2.7 g/dL (ref 1.9–3.7)
Total Bilirubin: 0.7 mg/dL (ref 0.2–1.2)
Total Protein: 7.1 g/dL (ref 6.1–8.1)

## 2017-03-12 LAB — LIPID PANEL
CHOL/HDL RATIO: 2.9 (calc) (ref <5.0)
Cholesterol: 246 mg/dL — ABNORMAL HIGH (ref <200)
HDL: 84 mg/dL (ref >50)
LDL Cholesterol (Calc): 139 mg/dL (calc) — ABNORMAL HIGH
NON-HDL CHOLESTEROL (CALC): 162 mg/dL — AB (ref <130)
Triglycerides: 109 mg/dL (ref <150)

## 2017-03-12 LAB — VITAMIN D 25 HYDROXY (VIT D DEFICIENCY, FRACTURES): VIT D 25 HYDROXY: 35 ng/mL (ref 30–100)

## 2017-04-16 DIAGNOSIS — F419 Anxiety disorder, unspecified: Secondary | ICD-10-CM | POA: Diagnosis not present

## 2017-04-16 DIAGNOSIS — F458 Other somatoform disorders: Secondary | ICD-10-CM | POA: Diagnosis not present

## 2017-04-16 DIAGNOSIS — R110 Nausea: Secondary | ICD-10-CM | POA: Diagnosis not present

## 2017-04-16 DIAGNOSIS — I4891 Unspecified atrial fibrillation: Secondary | ICD-10-CM | POA: Diagnosis not present

## 2017-04-16 DIAGNOSIS — R51 Headache: Secondary | ICD-10-CM | POA: Diagnosis not present

## 2017-04-16 DIAGNOSIS — F17200 Nicotine dependence, unspecified, uncomplicated: Secondary | ICD-10-CM | POA: Diagnosis not present

## 2017-04-16 DIAGNOSIS — R4182 Altered mental status, unspecified: Secondary | ICD-10-CM | POA: Diagnosis not present

## 2017-04-16 DIAGNOSIS — R9082 White matter disease, unspecified: Secondary | ICD-10-CM | POA: Diagnosis not present

## 2017-04-23 ENCOUNTER — Ambulatory Visit (INDEPENDENT_AMBULATORY_CARE_PROVIDER_SITE_OTHER): Payer: Medicare Other | Admitting: Internal Medicine

## 2017-04-23 VITALS — BP 106/76 | HR 76 | Temp 97.3°F | Resp 18 | Ht 68.5 in | Wt 195.0 lb

## 2017-04-23 DIAGNOSIS — I10 Essential (primary) hypertension: Secondary | ICD-10-CM | POA: Diagnosis not present

## 2017-04-23 MED ORDER — TELMISARTAN 40 MG PO TABS: ORAL_TABLET | ORAL | 1 refills | Status: DC

## 2017-04-23 NOTE — Patient Instructions (Signed)

## 2017-04-25 ENCOUNTER — Other Ambulatory Visit: Payer: Self-pay | Admitting: Internal Medicine

## 2017-04-25 ENCOUNTER — Encounter: Payer: Self-pay | Admitting: Internal Medicine

## 2017-04-25 MED ORDER — HYDROCHLOROTHIAZIDE 12.5 MG PO CAPS: ORAL_CAPSULE | ORAL | 1 refills | Status: DC

## 2017-04-25 NOTE — Progress Notes (Signed)
  Subjective:    Patient ID: Taylor Li, female    DOB: [birth year], 67 y.o.   MRN: [id number]  HPI  This delightful 67 yo MWF with know HTN was evaluated at [facility] on 04/16/2017 for a frontal HA and w/u was negative including a Normal Brain MRI. Her HA resolved during the course of her evaluation & she was discharged w/o changes in her meds.    Medication Sig  . ALPRAZolam  1 MG TAKE 1 TAB AT NIGHT AS NEEDED)  . VITAMIN C 1000 MG  Take daily.  Marland Kitchen aspirin 81 MG Take  daily.  Marland Kitchen BLACK PEPPER-TURMERIC  Take   . buPROPion-XL 300 MG  TAKE 1 TAB EVERY DAY  . VITAMIN D 5,000 Units Take  daily.  . cyclobenzaprine  10 MG TAKE 1 TABL At NIGHT AS NEEDED  FOR MUSCLE SPASM)  . fexofenadine  180 MG Take  daily.  . Multiple Vitamin  Take 1 tab daily.  . Probiotic  Take 1 cap as needed.   Marland Kitchen VITAMIN E  Take   . metoprolol succ-XL 50 MG  TAKE 1 TAB EVERY DAY *MAX 30 DAYS ON INSURANCE*   No Known Allergies   Past Medical History:  Diagnosis Date  . A-fib ([place])   . Arthritis   . C. difficile diarrhea   . Hyperlipidemia   . Hypertension   . Personal history of colonic polyp- adenoma 09/22/2013  . Pre-diabetes   . Vitamin D deficiency    Review of Systems  10 point systems review negative except as above.    Objective:   Physical Exam  BP 106/76   Pulse 76   Temp (!) 97.3 F (36.3 C)   Resp 18   Ht 5' 8.5" (1.74 m)   Wt 195 lb (88.5 kg)   BMI 29.22 kg/m   In no Distress.  HEENT - WNL. Neck - supple.  Chest - Clear equal BS. Cor - Nl HS. RRR w/o sig MGR. PP 1(+). No edema. MS- FROM w/o deformities.  Gait Nl. Neuro -  Nl w/o focal abnormalities.    Assessment & Plan:   1. Essential hypertension  - telmisartan (MICARDIS) 40 MG tablet; Take 1/2 to 1 tablet every night for BP  Dispense: 90 tablet; Refill: 1 - discussed meds & SE's and orthostatic BP monitoring.

## 2017-04-29 ENCOUNTER — Other Ambulatory Visit: Payer: Self-pay | Admitting: *Deleted

## 2017-04-29 MED ORDER — METOPROLOL SUCCINATE ER 50 MG PO TB24: ORAL_TABLET | ORAL | 3 refills | Status: DC

## 2017-05-13 ENCOUNTER — Other Ambulatory Visit: Payer: Self-pay | Admitting: Internal Medicine

## 2017-05-13 DIAGNOSIS — F411 Generalized anxiety disorder: Secondary | ICD-10-CM

## 2017-05-19 ENCOUNTER — Other Ambulatory Visit: Payer: Self-pay | Admitting: Physician Assistant

## 2017-05-21 ENCOUNTER — Other Ambulatory Visit: Payer: Self-pay | Admitting: Internal Medicine

## 2017-06-15 ENCOUNTER — Ambulatory Visit (INDEPENDENT_AMBULATORY_CARE_PROVIDER_SITE_OTHER): Payer: Medicare Other | Admitting: Internal Medicine

## 2017-06-15 ENCOUNTER — Encounter: Payer: Self-pay | Admitting: Internal Medicine

## 2017-06-15 VITALS — BP 110/72 | HR 76 | Temp 97.8°F | Resp 18 | Ht 68.25 in | Wt 197.2 lb

## 2017-06-15 DIAGNOSIS — E559 Vitamin D deficiency, unspecified: Secondary | ICD-10-CM | POA: Diagnosis not present

## 2017-06-15 DIAGNOSIS — R7309 Other abnormal glucose: Secondary | ICD-10-CM | POA: Diagnosis not present

## 2017-06-15 DIAGNOSIS — I480 Paroxysmal atrial fibrillation: Secondary | ICD-10-CM | POA: Diagnosis not present

## 2017-06-15 DIAGNOSIS — Z87891 Personal history of nicotine dependence: Secondary | ICD-10-CM

## 2017-06-15 DIAGNOSIS — I10 Essential (primary) hypertension: Secondary | ICD-10-CM

## 2017-06-15 DIAGNOSIS — Z1212 Encounter for screening for malignant neoplasm of rectum: Secondary | ICD-10-CM

## 2017-06-15 DIAGNOSIS — F17211 Nicotine dependence, cigarettes, in remission: Secondary | ICD-10-CM

## 2017-06-15 DIAGNOSIS — Z8249 Family history of ischemic heart disease and other diseases of the circulatory system: Secondary | ICD-10-CM

## 2017-06-15 DIAGNOSIS — Z23 Encounter for immunization: Secondary | ICD-10-CM

## 2017-06-15 DIAGNOSIS — Z122 Encounter for screening for malignant neoplasm of respiratory organs: Secondary | ICD-10-CM

## 2017-06-15 DIAGNOSIS — I700 Atherosclerosis of aorta: Secondary | ICD-10-CM

## 2017-06-15 DIAGNOSIS — Z136 Encounter for screening for cardiovascular disorders: Secondary | ICD-10-CM | POA: Diagnosis not present

## 2017-06-15 DIAGNOSIS — Z79899 Other long term (current) drug therapy: Secondary | ICD-10-CM

## 2017-06-15 DIAGNOSIS — E782 Mixed hyperlipidemia: Secondary | ICD-10-CM | POA: Diagnosis not present

## 2017-06-15 DIAGNOSIS — Z1211 Encounter for screening for malignant neoplasm of colon: Secondary | ICD-10-CM

## 2017-06-15 NOTE — Progress Notes (Signed)
[facility] Unk Pinto, M.D.     Uvaldo Bristle. Silverio Lay, P.A.-C Liane Comber, [place] [address] [place], [place] [address] Telephone [phone] Telefax [phone] Comprehensive Evaluation &  Examination     This very nice 67 y.o.female presents for a comprehensive evaluation and management of multiple medical co-morbidities.  Patient has been followed for HTN, HLD, T2_NIDDM  Prediabetes  and Vitamin D Deficiency.      Patient has 35+ year smoking hx/o smoking 1+ ppd  and she patient was counseled on the dangers of tobacco use and had quit in 2013.   Due to her her long history of smoking over 35 pk years,  I discussed lung cancer screening with her. She was agreeable to undergo a screening low dose CT scan of the chest.  I will refer her for a LDCT lung scan & lung cancer screening program.      HTN predates since 2002. Patient's BP has been controlled at home. Patient has remote hx/o pAfib in 2002 & 2004 required CV.  In 2011, she had a Negative Cardiolite.Today's BP is at goal -  110/72.  Patient denies any cardiac symptoms as chest pain, palpitations, shortness of breath, dizziness or ankle swelling.      Patient's hyperlipidemia is not controlled with diet. Last lipids were not at goal:  Lab Results  Component Value Date   CHOL 246 (H) 03/11/2017   HDL 84 03/11/2017   LDLCALC 139 (H) 03/11/2017   TRIG 109 03/11/2017   CHOLHDL 2.9 03/11/2017      Patient has prediabetes (A1c 5.8%/2011 and 5.9%/2011) and patient denies reactive hypoglycemic symptoms, visual blurring, diabetic polys, or paresthesias. Last A1c was Normal & at goal: Lab Results  Component Value Date   HGBA1C 5.5 12/08/2016      Finally, patient has history of Vitamin D Deficiency ("36"/2010) and last Vitamin D was still low: Lab Results  Component Value Date   VD25OH 35 03/11/2017   Current Outpatient Medications on File Prior to  Visit  Medication Sig  . ALPRAZolam (XANAX) 1 MG tablet Take 1/2 to 1 tablet 2 to 3 x / day ONLY if needed for Anxiety Attack & please try to limit to 5 days /week to avoid addiction  . Ascorbic Acid (VITAMIN C) 1000 MG tablet Take 1,000 mg by mouth daily.  Marland Kitchen aspirin 81 MG tablet Take 81 mg by mouth daily.  Marland Kitchen BLACK PEPPER-TURMERIC PO Take by mouth.  Marland Kitchen buPROPion (WELLBUTRIN XL) 300 MG 24 hr tablet TAKE 1 TABLET BY MOUTH EVERY DAY  . Cholecalciferol (VITAMIN D PO) Take 5,000 Units by mouth daily.  . cyclobenzaprine (FLEXERIL) 10 MG tablet TAKE 1/2 TO 1 TABLET BY MOUTH 3 TIMES A DAY AS NEEDED AS NEEDED FOR MUSCLE SPASM  . fexofenadine (ALLEGRA) 180 MG tablet Take 180 mg by mouth daily.  . hydrochlorothiazide (MICROZIDE) 12.5 MG capsule Take 1 to 2 caps daily for BP & Fluid Retention  . metoprolol succinate (TOPROL-XL) 50 MG 24 hr tablet TAKE 1 TABLET BY MOUTH EVERY DAY *MAX 30 DAYS ON INSURANCE*  . Multiple Vitamin (MULTIVITAMIN) tablet Take 1 tablet by mouth daily.  . Probiotic Product (PROBIOTIC PO) Take 1 capsule by mouth as needed.   Marland Kitchen telmisartan (MICARDIS) 40 MG tablet Take 1/2 to 1 tablet every night for BP   No current facility-administered medications on file prior to visit.    No Known Allergies   Past Medical History:  Diagnosis Date  . A-fib ([place])   . Arthritis   . C. difficile diarrhea   . Hyperlipidemia   . Hypertension   . Personal history of colonic polyp- adenoma 09/22/2013  . Pre-diabetes   . Vitamin D deficiency    Health Maintenance  Topic Date Due  . PAP SMEAR  04/24/1973  . MAMMOGRAM  04/16/2015  . PNA vac Low Risk Adult (1 of 2 - PCV13) 04/24/2017  . INFLUENZA VACCINE  08/13/2017  . COLONOSCOPY  09/17/2018  . TETANUS/TDAP  10/17/2025  . DEXA SCAN  Completed  . Hepatitis C Screening  Completed  . HIV Screening  Completed   Immunization History  Administered Date(s) Administered  . DTaP 01/13/2001  . Influenza Inj Mdck Quad With Preservative 09/30/2016   . Influenza Whole 10/06/2012  . Influenza,inj,quad, With Preservative 10/18/2015  . Influenza-Unspecified 11/11/2013, 11/15/2014  . PPD Test 01/14/2013, 04/17/2014, 04/16/2015, 05/15/2016  . Tdap 10/18/2015  . Zoster 11/19/2009   Last Colon - 09.04.2015 - Dr Carlean Purl recc 5 yr f/u due Sept 2020.  Last MGM - 04.03.2015 and declines f/u due to fear of rupture of saline implants  Past Surgical History:  Procedure Laterality Date  . APPENDECTOMY    . BREAST SURGERY Bilateral 1983   SQ mastectomies  . CARDIAC DEFIBRILLATOR PLACEMENT    . CARDIOVERSION    . CHOLECYSTECTOMY    . COLONOSCOPY    . LAMINECTOMY  1982   L4-L5  . NASAL SEPTUM SURGERY    . TONSILLECTOMY    . VAGINAL HYSTERECTOMY  1985   Family History  Problem Relation Age of Onset  . Hypertension Father   . Lung cancer Father   . Thyroid disease Father   . Hypertension Mother   . Barrett's esophagus Mother   . Colon cancer Neg Hx    Social History   Tobacco Use  . Smoking status: Former Smoker    Last attempt to quit: 04/18/2012    Years since quitting: 5.1  . Smokeless tobacco: Current User  . Tobacco comment: E-Cigarretts  Substance Use Topics  . Alcohol use: Yes    Alcohol/week: 1.8 oz    Types: 3 Standard drinks or equivalent per week    Comment: social  . Drug use: No    ROS Constitutional: Denies fever, chills, weight loss/gain, headaches, insomnia,  night sweats, and change in appetite. Does c/o fatigue. Eyes: Denies redness, blurred vision, diplopia, discharge, itchy, watery eyes.  ENT: Denies discharge, congestion, post nasal drip, epistaxis, sore throat, earache, hearing loss, dental pain, Tinnitus, Vertigo, Sinus pain, snoring.  Cardio: Denies chest pain, palpitations, irregular heartbeat, syncope, dyspnea, diaphoresis, orthopnea, PND, claudication, edema Respiratory: denies cough, dyspnea, DOE, pleurisy, hoarseness, laryngitis, wheezing.  Gastrointestinal: Denies dysphagia, heartburn, reflux,  water brash, pain, cramps, nausea, vomiting, bloating, diarrhea, constipation, hematemesis, melena, hematochezia, jaundice, hemorrhoids Genitourinary: Denies dysuria, frequency, urgency, nocturia, hesitancy, discharge, hematuria, flank pain Breast: Breast lumps, nipple discharge, bleeding.  Musculoskeletal: Denies arthralgia, myalgia, stiffness, Jt. Swelling, pain, limp, and strain/sprain. Denies falls. Skin: Denies puritis, rash, hives, warts, acne, eczema, changing in skin lesion Neuro: No weakness, tremor, incoordination, spasms, paresthesia, pain Psychiatric: Denies confusion, memory loss, sensory loss. Denies Depression. Endocrine: Denies change in weight, skin, hair change, nocturia, and paresthesia, diabetic polys, visual blurring, hyper / hypo glycemic episodes.  Heme/Lymph: No excessive bleeding, bruising, enlarged lymph nodes.  Physical Exam  BP 110/72   Pulse 76   Temp 97.8 F (36.6 C)   Resp 18   Ht  5' 8.25" (1.734 m)   Wt 197 lb 3.2 oz (89.4 kg)   BMI 29.76 kg/m   General Appearance: Well nourished, well groomed and in no apparent distress.  Eyes: PERRLA, EOMs, conjunctiva no swelling or erythema, normal fundi and vessels. Sinuses: No frontal/maxillary tenderness ENT/Mouth: EACs patent / TMs  nl. Nares clear without erythema, swelling, mucoid exudates. Oral hygiene is good. No erythema, swelling, or exudate. Tongue normal, non-obstructing. Tonsils not swollen or erythematous. Hearing normal.  Neck: Supple, thyroid not palpable. No bruits, nodes or JVD. Respiratory: Respiratory effort normal.  BS equal and clear bilateral without rales, rhonci, wheezing or stridor. Cardio: Heart sounds are normal with regular rate and rhythm and no murmurs, rubs or gallops. Peripheral pulses are normal and equal bilaterally without edema. No aortic or femoral bruits. Chest: symmetric with normal excursions and percussion. Breasts:  Soft palpable implants. Symmetric, without lumps, nipple  discharge, retractions, or fibrocystic changes.  Abdomen: Flat, soft with bowel sounds active. Nontender, no guarding, rebound, hernias, masses, or organomegaly.  Lymphatics: Non tender without lymphadenopathy.  Musculoskeletal: Full ROM all peripheral extremities, joint stability, 5/5 strength, and normal gait. Skin: Warm and dry without rashes, lesions, cyanosis, clubbing or  ecchymosis.  Neuro: Cranial nerves intact, reflexes equal bilaterally. Normal muscle tone, no cerebellar symptoms. Sensation intact.  Pysch: Alert and oriented X 3, normal affect, Insight and Judgment appropriate.   Assessment and Plan  1. Essential hypertension  - EKG 12-Lead - Urinalysis, Routine w reflex microscopic - CBC with Differential/Platelet - COMPLETE METABOLIC PANEL WITH GFR - Magnesium - TSH  2. Hyperlipidemia, mixed  - EKG 12-Lead - Lipid panel - TSH  3. Abnormal glucose  - Hemoglobin A1c - Insulin, random  4. Vitamin D deficiency  - VITAMIN D 25 Hydroxyl  5. Screening for ischemic heart disease  - EKG 12-Lead  6. Former smoker  - EKG 12-Lead  7. FHx: hypertension  - EKG 12-Lead  8. Aortic atherosclerosis (HCC)  - EKG 12-Lead  9. Screening for colorectal cancer  - POC Hemoccult Bld/Stl  10. Medication management  - Urinalysis, Routine w reflex microscopic - Microalbumin / creatinine urine ratio - CBC with Differential/Platelet - COMPLETE METABOLIC PANEL WITH GFR - Magnesium - Lipid panel - TSH - Hemoglobin A1c - Insulin, random - VITAMIN D 25 Hydroxyl  11. Need for prophylactic vaccination against Streptococcus pneumoniae (pneumococcus)  - Pneumococcal conjugate vaccine 13-valent            Patient was counseled in prudent diet to achieve/maintain BMI less than 25 for weight control, BP monitoring, regular exercise and medications. Discussed med's effects and SE's. Screening labs and tests as requested with regular follow-up as recommended. Over 40 minutes  of exam, counseling, chart review and high complex critical decision making was performed.

## 2017-06-15 NOTE — Patient Instructions (Addendum)
We Do NOT Approve of  [facility], [employer]  Our Patients  To Do Home Visits & We Do NOT Recommend  [facility] > > > > > > > > > > > > > > > > > > > > > > > > > > > > > > > > > > > > > > >  Preventive Care for Adults  A healthy lifestyle and preventive care can promote health and wellness. Preventive health guidelines for women include the following key practices.  A routine yearly physical is a good way to check with your health care provider about your health and preventive screening. It is a chance to share any concerns and updates on your health and to receive a thorough exam.  Visit your dentist for a routine exam and preventive care every 6 months. Brush your teeth twice a day and floss once a day. Good oral hygiene prevents tooth decay and gum disease.  The frequency of eye exams is based on your age, health, family medical history, use of contact lenses, and other factors. Follow your health care provider's recommendations for frequency of eye exams.  Eat a healthy diet. Foods like vegetables, fruits, whole grains, low-fat dairy products, and lean protein foods contain the nutrients you need without too many calories. Decrease your intake of foods high in solid fats, added sugars, and salt. Eat the right amount of calories for you. Get information about a proper diet from your health care provider, if necessary.  Regular physical exercise is one of the most important things you can do for your health. Most adults should get at least 150 minutes of moderate-intensity exercise (any activity that increases your heart rate and causes you to sweat) each week. In addition, most adults need muscle-strengthening exercises on 2 or more days a week.  Maintain a healthy weight. The body mass index (BMI) is a screening tool to identify possible weight problems. It provides an estimate of body fat based on height and weight. Your health care provider can find your BMI  and can help you achieve or maintain a healthy weight. For adults 20 years and older:  A BMI below 18.5 is considered underweight.  A BMI of 18.5 to 24.9 is normal.  A BMI of 25 to 29.9 is considered overweight.  A BMI of 30 and above is considered obese.  Maintain normal blood lipids and cholesterol levels by exercising and minimizing your intake of saturated fat. Eat a balanced diet with plenty of fruit and vegetables. If your lipid or cholesterol levels are high, you are over 50, or you are at high risk for heart disease, you may need your cholesterol levels checked more frequently. Ongoing high lipid and cholesterol levels should be treated with medicines if diet and exercise are not working.  If you smoke, find out from your health care provider how to quit. If you do not use tobacco, do not start.  Lung cancer screening is recommended for adults aged 53-80 years who are at high risk for developing lung cancer because of a history of smoking. A yearly low-dose CT scan of the lungs is recommended for people who have at least a 30-pack-year history of smoking and are a current smoker or have quit within the past 15 years. A pack year of smoking is smoking an average of 1 pack of cigarettes a day for 1 year (for example: 1 pack a day for 30 years or 2 packs a day  for 15 years). Yearly screening should continue until the smoker has stopped smoking for at least 15 years. Yearly screening should be stopped for people who develop a health problem that would prevent them from having lung cancer treatment.  Avoid use of street drugs. Do not share needles with anyone. Ask for help if you need support or instructions about stopping the use of drugs.  High blood pressure causes heart disease and increases the risk of stroke.  Ongoing high blood pressure should be treated with medicines if weight loss and exercise do not work.  If you are 29-34 years old, ask your health care provider if you should take  aspirin to prevent strokes.  Diabetes screening involves taking a blood sample to check your fasting blood sugar level. This should be done once every 3 years, after age 27, if you are within normal weight and without risk factors for diabetes. Testing should be considered at a younger age or be carried out more frequently if you are overweight and have at least 1 risk factor for diabetes.  Breast cancer screening is essential preventive care for women. You should practice "breast self-awareness." This means understanding the normal appearance and feel of your breasts and may include breast self-examination. Any changes detected, no matter how small, should be reported to a health care provider. Women in their 64s and 30s should have a clinical breast exam (CBE) by a health care provider as part of a regular health exam every 1 to 3 years. After age 69, women should have a CBE every year. Starting at age 6, women should consider having a mammogram (breast X-ray test) every year. Women who have a family history of breast cancer should talk to their health care provider about genetic screening. Women at a high risk of breast cancer should talk to their health care providers about having an MRI and a mammogram every year.  Breast cancer gene (BRCA)-related cancer risk assessment is recommended for women who have family members with BRCA-related cancers. BRCA-related cancers include breast, ovarian, tubal, and peritoneal cancers. Having family members with these cancers may be associated with an increased risk for harmful changes (mutations) in the breast cancer genes BRCA1 and BRCA2. Results of the assessment will determine the need for genetic counseling and BRCA1 and BRCA2 testing.  Routine pelvic exams to screen for cancer are no longer recommended for nonpregnant women who are considered low risk for cancer of the pelvic organs (ovaries, uterus, and vagina) and who do not have symptoms. Ask your health  care provider if a screening pelvic exam is right for you.  If you have had past treatment for cervical cancer or a condition that could lead to cancer, you need Pap tests and screening for cancer for at least 20 years after your treatment. If Pap tests have been discontinued, your risk factors (such as having a new sexual partner) need to be reassessed to determine if screening should be resumed. Some women have medical problems that increase the chance of getting cervical cancer. In these cases, your health care provider may recommend more frequent screening and Pap tests.    Colorectal cancer can be detected and often prevented. Most routine colorectal cancer screening begins at the age of 21 years and continues through age 8 years. However, your health care provider may recommend screening at an earlier age if you have risk factors for colon cancer. On a yearly basis, your health care provider may provide home test kits to check  for hidden blood in the stool. Use of a small camera at the end of a tube, to directly examine the colon (sigmoidoscopy or colonoscopy), can detect the earliest forms of colorectal cancer. Talk to your health care provider about this at age 50, when routine screening begins.  Direct exam of the colon should be repeated every 5-10 years through age 75 years, unless early forms of pre-cancerous polyps or small growths are found.  Osteoporosis is a disease in which the bones lose minerals and strength with aging. This can result in serious bone fractures or breaks. The risk of osteoporosis can be identified using a bone density scan. Women ages 65 years and over and women at risk for fractures or osteoporosis should discuss screening with their health care providers. Ask your health care provider whether you should take a calcium supplement or vitamin D to reduce the rate of osteoporosis.  Menopause can be associated with physical symptoms and risks. Hormone replacement therapy  is available to decrease symptoms and risks. You should talk to your health care provider about whether hormone replacement therapy is right for you.  Use sunscreen. Apply sunscreen liberally and repeatedly throughout the day. You should seek shade when your shadow is shorter than you. Protect yourself by wearing long sleeves, pants, a wide-brimmed hat, and sunglasses year round, whenever you are outdoors.  Once a month, do a whole body skin exam, using a mirror to look at the skin on your back. Tell your health care provider of new moles, moles that have irregular borders, moles that are larger than a pencil eraser, or moles that have changed in shape or color.  Stay current with required vaccines (immunizations).  Influenza vaccine. All adults should be immunized every year.  Tetanus, diphtheria, and acellular pertussis (Td, Tdap) vaccine. Pregnant women should receive 1 dose of Tdap vaccine during each pregnancy. The dose should be obtained regardless of the length of time since the last dose. Immunization is preferred during the 27th-36th week of gestation. An adult who has not previously received Tdap or who does not know her vaccine status should receive 1 dose of Tdap. This initial dose should be followed by tetanus and diphtheria toxoids (Td) booster doses every 10 years. Adults with an unknown or incomplete history of completing a 3-dose immunization series with Td-containing vaccines should begin or complete a primary immunization series including a Tdap dose. Adults should receive a Td booster every 10 years.    Zoster vaccine. One dose is recommended for adults aged 60 years or older unless certain conditions are present.    Pneumococcal 13-valent conjugate (PCV13) vaccine. When indicated, a person who is uncertain of her immunization history and has no record of immunization should receive the PCV13 vaccine. An adult aged 19 years or older who has certain medical conditions and has not  been previously immunized should receive 1 dose of PCV13 vaccine. This PCV13 should be followed with a dose of pneumococcal polysaccharide (PPSV23) vaccine. The PPSV23 vaccine dose should be obtained at least 1 or more year(s) after the dose of PCV13 vaccine. An adult aged 19 years or older who has certain medical conditions and previously received 1 or more doses of PPSV23 vaccine should receive 1 dose of PCV13. The PCV13 vaccine dose should be obtained 1 or more years after the last PPSV23 vaccine dose.    Pneumococcal polysaccharide (PPSV23) vaccine. When PCV13 is also indicated, PCV13 should be obtained first. All adults aged 65 years and older should   be immunized. An adult younger than age 65 years who has certain medical conditions should be immunized. Any person who resides in a nursing home or long-term care facility should be immunized. An adult smoker should be immunized. People with an immunocompromised condition and certain other conditions should receive both PCV13 and PPSV23 vaccines. People with human immunodeficiency virus (HIV) infection should be immunized as soon as possible after diagnosis. Immunization during chemotherapy or radiation therapy should be avoided. Routine use of PPSV23 vaccine is not recommended for American Indians, [place] Natives, or people younger than 65 years unless there are medical conditions that require PPSV23 vaccine. When indicated, people who have unknown immunization and have no record of immunization should receive PPSV23 vaccine. One-time revaccination 5 years after the first dose of PPSV23 is recommended for people aged 19-64 years who have chronic kidney failure, nephrotic syndrome, asplenia, or immunocompromised conditions. People who received 1-2 doses of PPSV23 before age 65 years should receive another dose of PPSV23 vaccine at age 65 years or later if at least 5 years have passed since the previous dose. Doses of PPSV23 are not needed for people immunized  with PPSV23 at or after age 65 years.   Preventive Services / Frequency  Ages 65 years and over  Blood pressure check.  Lipid and cholesterol check.  Lung cancer screening. / Every year if you are aged 55-80 years and have a 30-pack-year history of smoking and currently smoke or have quit within the past 15 years. Yearly screening is stopped once you have quit smoking for at least 15 years or develop a health problem that would prevent you from having lung cancer treatment.  Clinical breast exam.** / Every year after age 40 years.   BRCA-related cancer risk assessment.** / For women who have family members with a BRCA-related cancer (breast, ovarian, tubal, or peritoneal cancers).  Mammogram.** / Every year beginning at age 40 years and continuing for as long as you are in good health. Consult with your health care provider.  Pap test.** / Every 3 years starting at age 30 years through age 65 or 70 years with 3 consecutive normal Pap tests. Testing can be stopped between 65 and 70 years with 3 consecutive normal Pap tests and no abnormal Pap or HPV tests in the past 10 years.  Fecal occult blood test (FOBT) of stool. / Every year beginning at age 50 years and continuing until age 75 years. You may not need to do this test if you get a colonoscopy every 10 years.  Flexible sigmoidoscopy or colonoscopy.** / Every 5 years for a flexible sigmoidoscopy or every 10 years for a colonoscopy beginning at age 50 years and continuing until age 75 years.  Hepatitis C blood test.** / For all people born from 1945 through 1965 and any individual with known risks for hepatitis C.  Osteoporosis screening.** / A one-time screening for women ages 65 years and over and women at risk for fractures or osteoporosis.  Skin self-exam. / Monthly.  Influenza vaccine. / Every year.  Tetanus, diphtheria, and acellular pertussis (Tdap/Td) vaccine.** / 1 dose of Td every 10 years.  Zoster vaccine.** / 1 dose  for adults aged 60 years or older.  Pneumococcal 13-valent conjugate (PCV13) vaccine.** / Consult your health care provider.  Pneumococcal polysaccharide (PPSV23) vaccine.** / 1 dose for all adults aged 65 years and older. Screening for abdominal aortic aneurysm (AAA)  by ultrasound is recommended for people who have history of high blood pressure   or who are current or former smokers. ++++++++++++++++++++ Recommend Adult Low Dose Aspirin or  coated  Aspirin 81 mg daily  To reduce risk of Colon Cancer 20 %,  Skin Cancer 26 % ,  Melanoma 46%  and  Pancreatic cancer 60% ++++++++++++++++++++ Vitamin D goal  is between 70-100.  Please make sure that you are taking your Vitamin D as directed.  It is very important as a natural anti-inflammatory  helping hair, skin, and nails, as well as reducing stroke and heart attack risk.  It helps your bones and helps with mood. It also decreases numerous cancer risks so please take it as directed.  Low Vit D is associated with a 200-300% higher risk for CANCER  and 200-300% higher risk for HEART   ATTACK  &  STROKE.   ...................................... It is also associated with higher death rate at younger ages,  autoimmune diseases like Rheumatoid arthritis, Lupus, Multiple Sclerosis.    Also many other serious conditions, like depression, Alzheimer's Dementia, infertility, muscle aches, fatigue, fibromyalgia - just to name a few. ++++++++++++++++++ Recommend the book "The END of DIETING" by Dr Joel Fuhrman  & the book "The END of DIABETES " by Dr Joel Fuhrman At [employer] - get book & Audio CD's    Being diabetic has a  300% increased risk for heart attack, stroke, cancer, and alzheimer- type vascular dementia. It is very important that you work harder with diet by avoiding all foods that are white. Avoid white rice (brown & wild rice is OK), white potatoes (sweetpotatoes in moderation is OK), White bread or wheat bread or anything made out of  white flour like bagels, donuts, rolls, buns, biscuits, cakes, pastries, cookies, pizza crust, and pasta (made from white flour & egg whites) - vegetarian pasta or spinach or wheat pasta is OK. Multigrain breads like Arnold's or Pepperidge [employer], or multigrain sandwich thins or flatbreads.  Diet, exercise and weight loss can reverse and cure diabetes in the early stages.  Diet, exercise and weight loss is very important in the control and prevention of complications of diabetes which affects every system in your body, ie. Brain - dementia/stroke, eyes - glaucoma/blindness, heart - heart attack/heart failure, kidneys - dialysis, stomach - gastric paralysis, intestines - malabsorption, nerves - severe painful neuritis, circulation - gangrene & loss of a leg(s), and finally cancer and Alzheimers.    I recommend avoid fried & greasy foods,  sweets/candy, white rice (brown or wild rice or Quinoa is OK), white potatoes (sweet potatoes are OK) - anything made from white flour - bagels, doughnuts, rolls, buns, biscuits,white and wheat breads, pizza crust and traditional pasta made of white flour & egg white(vegetarian pasta or spinach or wheat pasta is OK).  Multi-grain bread is OK - like multi-grain flat bread or sandwich thins. Avoid alcohol in excess. Exercise is also important.    Eat all the vegetables you want - avoid meat, especially red meat and dairy - especially cheese.  Cheese is the most concentrated form of trans-fats which is the worst thing to clog up our arteries. Veggie cheese is OK which can be found in the fresh produce section at [employer] or [employer] or [employer]  +++++++++++++++++++ DASH Eating Plan  DASH stands for "Dietary Approaches to Stop Hypertension."   The DASH eating plan is a healthy eating plan that has been shown to reduce high blood pressure (hypertension). Additional health benefits may include reducing the risk of type 2 diabetes mellitus, heart disease,   and stroke. The  DASH eating plan may also help with weight loss. WHAT DO I NEED TO KNOW ABOUT THE DASH EATING PLAN? For the DASH eating plan, you will follow these general guidelines:  Choose foods with a percent daily value for sodium of less than 5% (as listed on the food label).  Use salt-free seasonings or herbs instead of table salt or sea salt.  Check with your health care provider or pharmacist before using salt substitutes.  Eat lower-sodium products, often labeled as "lower sodium" or "no salt added."  Eat fresh foods.  Eat more vegetables, fruits, and low-fat dairy products.  Choose whole grains. Look for the word "whole" as the first word in the ingredient list.  Choose fish   Limit sweets, desserts, sugars, and sugary drinks.  Choose heart-healthy fats.  Eat veggie cheese   Eat more home-cooked food and less restaurant, buffet, and fast food.  Limit fried foods.  Cook foods using methods other than frying.  Limit canned vegetables. If you do use them, rinse them well to decrease the sodium.  When eating at a restaurant, ask that your food be prepared with less salt, or no salt if possible.                      WHAT FOODS CAN I EAT? Read Dr Fara Olden Fuhrman's books on The End of Dieting & The End of Diabetes  Grains Whole grain or whole wheat bread. Brown rice. Whole grain or whole wheat pasta. Quinoa, bulgur, and whole grain cereals. Low-sodium cereals. Corn or whole wheat flour tortillas. Whole grain cornbread. Whole grain crackers. Low-sodium crackers.  Vegetables Fresh or frozen vegetables (raw, steamed, roasted, or grilled). Low-sodium or reduced-sodium tomato and vegetable juices. Low-sodium or reduced-sodium tomato sauce and paste. Low-sodium or reduced-sodium canned vegetables.   Fruits All fresh, canned (in natural juice), or frozen fruits.  Protein Products  All fish and seafood.  Dried beans, peas, or lentils. Unsalted nuts and seeds. Unsalted canned  beans.  Dairy Low-fat dairy products, such as skim or 1% milk, 2% or reduced-fat cheeses, low-fat ricotta or cottage cheese, or plain low-fat yogurt. Low-sodium or reduced-sodium cheeses.  Fats and Oils Tub margarines without trans fats. Light or reduced-fat mayonnaise and salad dressings (reduced sodium). Avocado. Safflower, olive, or canola oils. Natural peanut or almond butter.  Other Unsalted popcorn and pretzels. The items listed above may not be a complete list of recommended foods or beverages. Contact your dietitian for more options.  +++++++++++++++  WHAT FOODS ARE NOT RECOMMENDED? Grains/ White flour or wheat flour White bread. White pasta. White rice. Refined cornbread. Bagels and croissants. Crackers that contain trans fat.  Vegetables  Creamed or fried vegetables. Vegetables in a . Regular canned vegetables. Regular canned tomato sauce and paste. Regular tomato and vegetable juices.  Fruits Dried fruits. Canned fruit in light or heavy syrup. Fruit juice.  Meat and Other Protein Products Meat in general - RED meat & White meat.  Fatty cuts of meat. Ribs, chicken wings, all processed meats as bacon, sausage, bologna, salami, fatback, hot dogs, bratwurst and packaged luncheon meats.  Dairy Whole or 2% milk, cream, half-and-half, and cream cheese. Whole-fat or sweetened yogurt. Full-fat cheeses or blue cheese. Non-dairy creamers and whipped toppings. Processed cheese, cheese spreads, or cheese curds.  Condiments Onion and garlic salt, seasoned salt, table salt, and sea salt. Canned and packaged gravies. Worcestershire sauce. Tartar sauce. Barbecue sauce. Teriyaki sauce. Soy sauce, including reduced  sodium. Steak sauce. Fish sauce. Oyster sauce. Cocktail sauce. Horseradish. Ketchup and mustard. Meat flavorings and tenderizers. Bouillon cubes. Hot sauce. Tabasco sauce. Marinades. Taco seasonings. Relishes.  Fats and Oils Butter, stick margarine, lard, shortening and bacon  fat. Coconut, palm kernel, or palm oils. Regular salad dressings.  Pickles and olives. Salted popcorn and pretzels.  The items listed above may not be a complete list of foods and beverages to avoid.

## 2017-06-16 ENCOUNTER — Other Ambulatory Visit: Payer: Self-pay | Admitting: Internal Medicine

## 2017-06-16 DIAGNOSIS — E782 Mixed hyperlipidemia: Secondary | ICD-10-CM

## 2017-06-16 LAB — CBC WITH DIFFERENTIAL/PLATELET
BASOS ABS: 23 {cells}/uL (ref 0–200)
Basophils Relative: 0.3 %
EOS PCT: 1 %
Eosinophils Absolute: 77 cells/uL (ref 15–500)
HCT: 41.5 % (ref 35.0–45.0)
Hemoglobin: 14.3 g/dL (ref 11.7–15.5)
Lymphs Abs: 2456 cells/uL (ref 850–3900)
MCH: 29.9 pg (ref 27.0–33.0)
MCHC: 34.5 g/dL (ref 32.0–36.0)
MCV: 86.6 fL (ref 80.0–100.0)
MPV: 10.9 fL (ref 7.5–12.5)
Monocytes Relative: 8.1 %
NEUTROS PCT: 58.7 %
Neutro Abs: 4520 cells/uL (ref 1500–7800)
PLATELETS: 281 10*3/uL (ref 140–400)
RBC: 4.79 10*6/uL (ref 3.80–5.10)
RDW: 13.1 % (ref 11.0–15.0)
TOTAL LYMPHOCYTE: 31.9 %
WBC mixed population: 624 cells/uL (ref 200–950)
WBC: 7.7 10*3/uL (ref 3.8–10.8)

## 2017-06-16 LAB — LIPID PANEL
CHOL/HDL RATIO: 3.2 (calc) (ref <5.0)
CHOLESTEROL: 242 mg/dL — AB (ref <200)
HDL: 76 mg/dL (ref >50)
LDL Cholesterol (Calc): 132 mg/dL (calc) — ABNORMAL HIGH
Non-HDL Cholesterol (Calc): 166 mg/dL (calc) — ABNORMAL HIGH (ref <130)
Triglycerides: 205 mg/dL — ABNORMAL HIGH (ref <150)

## 2017-06-16 LAB — COMPLETE METABOLIC PANEL WITH GFR
AG RATIO: 1.8 (calc) (ref 1.0–2.5)
ALKALINE PHOSPHATASE (APISO): 72 U/L (ref 33–130)
ALT: 12 U/L (ref 6–29)
AST: 12 U/L (ref 10–35)
Albumin: 4.4 g/dL (ref 3.6–5.1)
BUN: 16 mg/dL (ref 7–25)
CALCIUM: 9.2 mg/dL (ref 8.6–10.4)
CO2: 30 mmol/L (ref 20–32)
Chloride: 105 mmol/L (ref 98–110)
Creat: 0.79 mg/dL (ref 0.50–0.99)
GFR, EST NON AFRICAN AMERICAN: 79 mL/min/{1.73_m2} (ref >=60)
GFR, Est African American: 91 mL/min/{1.73_m2} (ref >=60)
GLOBULIN: 2.5 g/dL (ref 1.9–3.7)
Glucose, Bld: 87 mg/dL (ref 65–99)
POTASSIUM: 3.9 mmol/L (ref 3.5–5.3)
SODIUM: 141 mmol/L (ref 135–146)
Total Bilirubin: 0.5 mg/dL (ref 0.2–1.2)
Total Protein: 6.9 g/dL (ref 6.1–8.1)

## 2017-06-16 LAB — URINALYSIS, ROUTINE W REFLEX MICROSCOPIC
BACTERIA UA: NONE SEEN /HPF
Bilirubin Urine: NEGATIVE
Glucose, UA: NEGATIVE
Hgb urine dipstick: NEGATIVE
Hyaline Cast: NONE SEEN /LPF
KETONES UR: NEGATIVE
Nitrite: NEGATIVE
PROTEIN: NEGATIVE
RBC / HPF: NONE SEEN /HPF (ref 0–2)
SPECIFIC GRAVITY, URINE: 1.026 (ref 1.001–1.03)
SQUAMOUS EPITHELIAL / LPF: NONE SEEN /HPF (ref <=5)
pH: 5.5 (ref 5.0–8.0)

## 2017-06-16 LAB — HEMOGLOBIN A1C
Hgb A1c MFr Bld: 5.5 % of total Hgb (ref <5.7)
MEAN PLASMA GLUCOSE: 111 (calc)
eAG (mmol/L): 6.2 (calc)

## 2017-06-16 LAB — MAGNESIUM: MAGNESIUM: 2 mg/dL (ref 1.5–2.5)

## 2017-06-16 LAB — VITAMIN D 25 HYDROXY (VIT D DEFICIENCY, FRACTURES): Vit D, 25-Hydroxy: 28 ng/mL — ABNORMAL LOW (ref 30–100)

## 2017-06-16 LAB — MICROALBUMIN / CREATININE URINE RATIO
CREATININE, URINE: 155 mg/dL (ref 20–275)
MICROALB UR: 0.8 mg/dL
Microalb Creat Ratio: 5 mcg/mg creat (ref <30)

## 2017-06-16 LAB — INSULIN, RANDOM: Insulin: 13.9 u[IU]/mL (ref 2.0–19.6)

## 2017-06-16 LAB — TSH: TSH: 1.38 mIU/L (ref 0.40–4.50)

## 2017-06-16 MED ORDER — ROSUVASTATIN CALCIUM 40 MG PO TABS: ORAL_TABLET | ORAL | 1 refills | Status: DC

## 2017-06-17 ENCOUNTER — Encounter: Payer: Self-pay | Admitting: Internal Medicine

## 2017-07-21 ENCOUNTER — Other Ambulatory Visit: Payer: Self-pay | Admitting: Internal Medicine

## 2017-07-21 ENCOUNTER — Ambulatory Visit
Admission: RE | Admit: 2017-07-21 | Discharge: 2017-07-21 | Disposition: A | Discharge status: Home / Self Care | Payer: Medicare Other | Source: Ambulatory Visit | Attending: Internal Medicine | Admitting: Internal Medicine

## 2017-07-21 DIAGNOSIS — Z87891 Personal history of nicotine dependence: Secondary | ICD-10-CM | POA: Diagnosis not present

## 2017-07-21 DIAGNOSIS — I712 Thoracic aortic aneurysm, without rupture, unspecified: Secondary | ICD-10-CM

## 2017-07-21 DIAGNOSIS — Z122 Encounter for screening for malignant neoplasm of respiratory organs: Secondary | ICD-10-CM

## 2017-07-21 DIAGNOSIS — F17211 Nicotine dependence, cigarettes, in remission: Secondary | ICD-10-CM

## 2017-07-22 ENCOUNTER — Encounter: Payer: Self-pay | Admitting: Internal Medicine

## 2017-07-23 ENCOUNTER — Other Ambulatory Visit: Payer: Self-pay | Admitting: Internal Medicine

## 2017-07-23 DIAGNOSIS — I712 Thoracic aortic aneurysm, without rupture, unspecified: Secondary | ICD-10-CM

## 2017-07-31 ENCOUNTER — Ambulatory Visit (INDEPENDENT_AMBULATORY_CARE_PROVIDER_SITE_OTHER): Payer: Medicare Other | Admitting: Internal Medicine

## 2017-07-31 ENCOUNTER — Encounter: Payer: Self-pay | Admitting: Internal Medicine

## 2017-07-31 VITALS — BP 122/90 | HR 54 | Temp 97.9°F | Ht 68.25 in | Wt 200.8 lb

## 2017-07-31 DIAGNOSIS — R002 Palpitations: Secondary | ICD-10-CM

## 2017-07-31 DIAGNOSIS — I493 Ventricular premature depolarization: Secondary | ICD-10-CM | POA: Diagnosis not present

## 2017-07-31 DIAGNOSIS — I10 Essential (primary) hypertension: Secondary | ICD-10-CM | POA: Diagnosis not present

## 2017-07-31 MED ORDER — DILTIAZEM HCL ER COATED BEADS 180 MG PO CP24: ORAL_CAPSULE | ORAL | 3 refills | Status: DC

## 2017-07-31 NOTE — Progress Notes (Signed)
Subjective:    Patient ID: Taylor Li, female    DOB: 17-Jul-1950, 67 y.o.   MRN: [id number]  HPI    This nice 67 yo MWF with hx/o HTN (2002) and a remote hx/o pAfib in 2002 & 2004 requiring CV. Cardiolite scan was negative in 2011. She presents now with new onset of irregular palpitations with concern for possible pAfib. Patient denies any cardiac symptoms as chest pain, shortness of breath, dizziness or ankle swelling.  Recent TSH & electrolytes were Normal last month.   Medication Sig  . ALPRAZolam (XANAX) 1 MG tablet Take 1/2 to 1 tablet 2 to 3 x / day ONLY if needed for Anxiety Attack & please try to limit to 5 days /week to avoid addiction  . Ascorbic Acid (VITAMIN C) 1000 MG tablet Take 1,000 mg by mouth daily.  Marland Kitchen aspirin 81 MG tablet Take 81 mg by mouth daily.  Marland Kitchen BLACK PEPPER-TURMERIC PO Take by mouth.  Marland Kitchen buPROPion (WELLBUTRIN XL) 300 MG 24 hr tablet TAKE 1 TABLET BY MOUTH EVERY DAY  . Cholecalciferol (VITAMIN D PO) Take 5,000 Units by mouth daily.  . cyclobenzaprine (FLEXERIL) 10 MG tablet TAKE 1/2 TO 1 TABLET BY MOUTH 3 TIMES A DAY AS NEEDED AS NEEDED FOR MUSCLE SPASM  . fexofenadine (ALLEGRA) 180 MG tablet Take 180 mg by mouth daily.  . hydrochlorothiazide (MICROZIDE) 12.5 MG capsule Take 1 to 2 caps daily for BP & Fluid Retention  . metoprolol succinate (TOPROL-XL) 50 MG 24 hr tablet TAKE 1 TABLET BY MOUTH EVERY DAY *MAX 30 DAYS ON INSURANCE*  . Multiple Vitamin (MULTIVITAMIN) tablet Take 1 tablet by mouth daily.  . Probiotic Product (PROBIOTIC PO) Take 1 capsule by mouth as needed.   . rosuvastatin (CRESTOR) 40 MG tablet Take 1/2 to 1 tablet daily or as directed for Cholesterol  . telmisartan (MICARDIS) 40 MG tablet Take 1/2 to 1 tablet every night for BP   No Known Allergies   Past Medical History:  Diagnosis Date  . A-fib ([place])   . Arthritis   . C. difficile diarrhea   . Hyperlipidemia   . Hypertension   . Personal history of colonic polyp- adenoma 09/22/2013  .  Pre-diabetes   . Vitamin D deficiency    Past Surgical History:  Procedure Laterality Date  . APPENDECTOMY    . BREAST SURGERY Bilateral 1983   SQ mastectomies  . CARDIAC DEFIBRILLATOR PLACEMENT    . CARDIOVERSION    . CHOLECYSTECTOMY    . COLONOSCOPY    . LAMINECTOMY  1982   L4-L5  . NASAL SEPTUM SURGERY    . TONSILLECTOMY    . VAGINAL HYSTERECTOMY  1985   Review of Systems    10 point systems review negative except as above.    Objective:   Physical Exam  BP 122/90   Pulse (!) 54   Temp 97.9 F (36.6 C)   Ht 5' 8.25" (1.734 m)   Wt 200 lb 12.8 oz (91.1 kg)   BMI 30.31 kg/m   In no distress  HEENT - WNL. Neck - supple.  Chest - Clear equal BS. Cor - Nl HS. RRR with occas ES & sig MGR. PP 1(+). No edema. MS- FROM w/o deformities.  Gait Nl. Neuro -  Nl w/o focal abnormalities.  EKG shows NSR ~ 65 b/m with occasional unifocal VPB's.    Assessment & Plan:   1. Essential hypertension  - D/C Metoprolol  - diltiazem (CARDIZEM CD) 180 MG 24  hr capsule; Take 1 capsule each morning for BP & heart skips  Dispense: 90 capsule; Refill: 3  2. Palpitations  - diltiazem (CARDIZEM CD) 180 MG 24 hr capsule; Take 1 capsule each morning for BP & heart skips  Dispense: 90 capsule; Refill: 3  - EKG 12-Lead  3. Unifocal PVCs  - diltiazem (CARDIZEM CD) 180 MG 24 hr capsule; Take 1 capsule each morning for BP & heart skips  Dispense: 90 capsule; Refill: 3  - EKG 12-Lead

## 2017-07-31 NOTE — Patient Instructions (Addendum)
Premature Ventricular Contraction A premature ventricular contraction (PVC) is a common irregularity in the normal heart rhythm. These contractions are extra heartbeats that start in the heart ventricles and occur too early in the normal sequence. During the PVC, the heart's normal electrical pathway is not used, so the beat is shorter and less effective. In most cases, these contractions come and go and do not require treatment. What are the causes? In many cases, the cause may not be known. Common causes of the condition include:  Smoking.  Drinking alcohol.  Caffeine.  Certain medicines.  Some illegal drugs.  Stress.  Certain medical conditions can also cause PVCs:  Changes in minerals in the blood (electrolytes).  Heart failure.  Heart valve problems.  Low blood oxygen levels or high carbon dioxide levels.  Heart attack, or coronary artery disease.  What are the signs or symptoms? The main symptom of this condition is a fast or skipped heartbeat (palpitations). Other symptoms include:  Chest pain.  Shortness of breath.  Feeling tired.  Dizziness.  In some cases, there are no symptoms. How is this diagnosed? This condition may be diagnosed based on:  Your medical history.  A physical exam. During the exam, the health care provider will check for irregular heartbeats.  Tests, such as: ? An ECG (electrocardiogram) to monitor the electrical activity of your heart. ? Holter monitor testing. This involves wearing a device that clips to your clothing and monitors the electrical activity of your heart over longer periods of time. ? Stress tests to see how exercise affects your heart rhythm and blood supply. ? Echocardiogram. This test uses sound waves (ultrasound) to produce an image of your heart. ? Electrophysiology study. This test checks the electric pathways in your heart.  How is this treated? Treatment depends on any underlying conditions, the type of PVCs  that you are having, and how much the symptoms are interfering with your daily life. Possible treatments include:  Avoiding things that can trigger the premature contractions, such as caffeine or alcohol.  Medicines. These may be given if symptoms are severe or if the extra heartbeats are frequent.  Treatment for any underlying condition that is found to be the cause of the contractions.  Catheter ablation. This procedure destroys the heart tissues that send abnormal signals.  In some cases, no treatment is required. Follow these instructions at home: Lifestyle Follow these instructions as told by your health care provider:  Do not use any products that contain nicotine or tobacco, such as cigarettes and e-cigarettes. If you need help quitting, ask your health care provider.  If caffeine triggers episodes of PVC, do not eat, drink, or use anything with caffeine in it.  If caffeine does not seem to trigger episodes, consume caffeine in moderation.  If alcohol triggers episodes of PVC, do not drink alcohol.  If alcohol does not seem to trigger episodes, limit alcohol intake to no more than 1 drink a day for nonpregnant women and 2 drinks a day for men. One drink equals 12 oz of beer, 5 oz of wine, or 1 oz of hard liquor.  Exercise regularly. Ask your health care provider what type of exercise is safe for you.  Find healthy ways to manage stress. Avoid stressful situations when possible.  Try to get at least 7-9 hours of sleep each night, or as much as recommended by your health care provider.  Do not use illegal drugs.  General instructions  Take over-the-counter and prescription medicines only   as told by your health care provider.  Keep all follow-up visits as told by your health care provider. This is important. Get help right away if:  You feel palpitations that are frequent or continual.  You have chest pain.  You have shortness of breath.  You have sweating for no  reason.  You have nausea and vomiting.  You become light-headed or you faint. This information is not intended to replace advice given to you by your health care provider. Make sure you discuss any questions you have with your health care provider. Document Released: 08/17/2003 Document Revised: 08/24/2015 Document Reviewed: 06/06/2015 Elsevier Interactive Patient Education  2018 [employer] Inc[employer]  

## 2017-08-01 ENCOUNTER — Encounter: Payer: Self-pay | Admitting: Internal Medicine

## 2017-08-10 ENCOUNTER — Other Ambulatory Visit: Payer: Self-pay

## 2017-08-10 DIAGNOSIS — Z1212 Encounter for screening for malignant neoplasm of rectum: Principal | ICD-10-CM

## 2017-08-10 DIAGNOSIS — Z1211 Encounter for screening for malignant neoplasm of colon: Secondary | ICD-10-CM

## 2017-08-10 LAB — POC HEMOCCULT BLD/STL (HOME/3-CARD/SCREEN)
FECAL OCCULT BLD: NEGATIVE
FECAL OCCULT BLD: NEGATIVE
Fecal Occult Blood, POC: NEGATIVE

## 2017-08-17 ENCOUNTER — Other Ambulatory Visit: Payer: Self-pay | Admitting: Internal Medicine

## 2017-08-17 DIAGNOSIS — F411 Generalized anxiety disorder: Secondary | ICD-10-CM

## 2017-08-20 ENCOUNTER — Other Ambulatory Visit: Payer: Self-pay | Admitting: Physician Assistant

## 2017-08-21 ENCOUNTER — Ambulatory Visit (INDEPENDENT_AMBULATORY_CARE_PROVIDER_SITE_OTHER): Payer: Medicare Other | Admitting: Internal Medicine

## 2017-08-21 VITALS — BP 140/90 | HR 80 | Temp 98.1°F | Resp 16 | Ht 68.25 in | Wt 197.4 lb

## 2017-08-21 DIAGNOSIS — M940 Chondrocostal junction syndrome [Tietze]: Secondary | ICD-10-CM | POA: Diagnosis not present

## 2017-08-21 DIAGNOSIS — I493 Ventricular premature depolarization: Secondary | ICD-10-CM | POA: Diagnosis not present

## 2017-08-21 DIAGNOSIS — I10 Essential (primary) hypertension: Secondary | ICD-10-CM

## 2017-08-21 DIAGNOSIS — R002 Palpitations: Secondary | ICD-10-CM | POA: Diagnosis not present

## 2017-08-22 ENCOUNTER — Encounter: Payer: Self-pay | Admitting: Internal Medicine

## 2017-08-22 NOTE — Progress Notes (Signed)
Subjective:    Patient ID: Taylor Li, female    DOB: 1950/02/29, 67 y.o.   MRN: [id number]  HPI  This nice 67 yo MWF with HTN [id number]) and remote pAfib in 2002 & 2004 (Req CV) was seen 10 days ago with c/o palpitations and EKG showed UF PVC's & she was changed from Metoprolol & Micardis to Diltiazem. Today she returns for 10 day f/u and reports random BP's has been slightly elevated at 145/80 last night and 145/98 this morning.  3 days ago, she reports a 20 minute episode of sharp CP at rest and w/o assoc N/V, dyspnea or diaphoresis.     Recently she had a LD screening Chest CT & was discovered to have a 4.3 cm Asc Thoracic Aneurysm and has been quite anxious since this discovery. She has f/u scheduled with Dr Roxan Hockey.   Medication Sig  . ALPRAZolam (XANAX) 1 MG tablet Take 1/2-1 tab up to 3 times a day as needed for severe anxiety. Avoid taking daily.  . Ascorbic Acid (VITAMIN C) 1000 MG tablet Take 1,000 mg by mouth daily.  Marland Kitchen aspirin 81 MG tablet Take 81 mg by mouth daily.  Marland Kitchen BLACK PEPPER-TURMERIC PO Take by mouth.  Marland Kitchen buPROPion (WELLBUTRIN XL) 300 MG 24 hr tablet TAKE 1 TABLET BY MOUTH EVERY DAY  . Cholecalciferol (VITAMIN D PO) Take 5,000 Units by mouth daily.  . cyclobenzaprine (FLEXERIL) 10 MG tablet TAKE 1/2 TO 1 TABLET BY MOUTH 3 TIMES A DAY AS NEEDED AS NEEDED FOR MUSCLE SPASM  . diltiazem (CARDIZEM CD) 180 MG 24 hr capsule Take 1 capsule each morning for BP & heart skips  . fexofenadine (ALLEGRA) 180 MG tablet Take 180 mg by mouth daily.  . hydrochlorothiazide (MICROZIDE) 12.5 MG capsule Take 1 to 2 caps daily for BP & Fluid Retention  . Multiple Vitamin (MULTIVITAMIN) tablet Take 1 tablet by mouth daily.  . Probiotic Product (PROBIOTIC PO) Take 1 capsule by mouth as needed.   . rosuvastatin (CRESTOR) 40 MG tablet Take 1/2 to 1 tablet daily or as directed for Cholesterol  . metoprolol succinate (TOPROL-XL) 50 MG 24 hr tablet TAKE 1 TABLET BY MOUTH EVERY DAY *MAX 30 DAYS ON  INSURANCE*  . telmisartan (MICARDIS) 40 MG tablet Take 1/2 to 1 tablet every night for BP   No facility-administered medications prior to visit.     No Active Allergies   Past Medical History:  Diagnosis Date  . A-fib ([place])   . Arthritis   . C. difficile diarrhea   . Hyperlipidemia   . Hypertension   . Personal history of colonic polyp- adenoma 09/22/2013  . Pre-diabetes   . Vitamin D deficiency    Review of Systems  10 point systems review negative except as above.    Objective:   Physical Exam  BP 140/90   P 80   T 98.1 F  R 16   Ht 5' 8.25"    Wt 197 lb 6.4 oz    BMI 29.80   HEENT - WNL. Neck - supple.  Chest - Clear equal BS. Exquisite point tenderness of the lower Left parasternal CC jts.  Cor - Nl HS. RRR w/o sig MGR. PP 1(+). No edema. MS- FROM w/o deformities.  Gait Nl. Neuro -  Nl w/o focal abnormalities.    Assessment & Plan:   1. Essential hypertension  - advised restart Micardis & monitor BP's.   2. Palpitations  - repeat EKG shows NSR, 1st deg AVB,  Transition V1-V2 - No PVC's.  3. Costochondritis  - Reassured

## 2017-08-22 NOTE — Patient Instructions (Signed)

## 2017-08-24 ENCOUNTER — Telehealth: Payer: Self-pay | Admitting: *Deleted

## 2017-08-24 NOTE — Telephone Encounter (Signed)
Patient called and restarted Micardis, after being off of it x 3 weeks.  She reports feeling better and would like to continue until her Cardiology appointment.  Per Dr Melford Aase, a message was left to inform the patient it is OK to continue the medication.

## 2017-08-30 ENCOUNTER — Other Ambulatory Visit: Payer: Self-pay | Admitting: Internal Medicine

## 2017-08-30 DIAGNOSIS — I493 Ventricular premature depolarization: Secondary | ICD-10-CM

## 2017-08-30 DIAGNOSIS — I495 Sick sinus syndrome: Secondary | ICD-10-CM

## 2017-08-30 DIAGNOSIS — I480 Paroxysmal atrial fibrillation: Secondary | ICD-10-CM

## 2017-09-01 ENCOUNTER — Encounter: Payer: Medicare Other | Admitting: Thoracic Surgery (Cardiothoracic Vascular Surgery)

## 2017-09-04 ENCOUNTER — Ambulatory Visit (INDEPENDENT_AMBULATORY_CARE_PROVIDER_SITE_OTHER): Payer: Medicare Other | Admitting: Cardiovascular Disease

## 2017-09-04 ENCOUNTER — Encounter: Payer: Self-pay | Admitting: Cardiovascular Disease

## 2017-09-04 VITALS — BP 104/80 | HR 78 | Ht 68.0 in | Wt 195.0 lb

## 2017-09-04 DIAGNOSIS — R002 Palpitations: Secondary | ICD-10-CM

## 2017-09-04 DIAGNOSIS — I712 Thoracic aortic aneurysm, without rupture, unspecified: Secondary | ICD-10-CM | POA: Insufficient documentation

## 2017-09-04 DIAGNOSIS — R0789 Other chest pain: Secondary | ICD-10-CM | POA: Diagnosis not present

## 2017-09-04 DIAGNOSIS — E7800 Pure hypercholesterolemia, unspecified: Secondary | ICD-10-CM | POA: Diagnosis not present

## 2017-09-04 DIAGNOSIS — I493 Ventricular premature depolarization: Secondary | ICD-10-CM | POA: Diagnosis not present

## 2017-09-04 DIAGNOSIS — R072 Precordial pain: Secondary | ICD-10-CM

## 2017-09-04 LAB — LIPID PANEL
CHOL/HDL RATIO: 2.2 ratio (ref 0.0–4.4)
Cholesterol, Total: 171 mg/dL (ref 100–199)
HDL: 78 mg/dL (ref >39)
LDL Calculated: 77 mg/dL (ref 0–99)
TRIGLYCERIDES: 79 mg/dL (ref 0–149)
VLDL Cholesterol Cal: 16 mg/dL (ref 5–40)

## 2017-09-04 LAB — HEPATIC FUNCTION PANEL
ALBUMIN: 4.9 g/dL — AB (ref 3.6–4.8)
ALT: 23 IU/L (ref 0–32)
AST: 18 IU/L (ref 0–40)
Alkaline Phosphatase: 75 IU/L (ref 39–117)
BILIRUBIN TOTAL: 0.6 mg/dL (ref 0.0–1.2)
BILIRUBIN, DIRECT: 0.19 mg/dL (ref 0.00–0.40)
Total Protein: 7.3 g/dL (ref 6.0–8.5)

## 2017-09-04 NOTE — Assessment & Plan Note (Signed)
History of recent palpitations with EKG that shows bigeminal PVCs in the past.  She has cut out her caffeine the last 3 weeks.  She is under a lot of stress from the loss of her mother February 2018 with ongoing grief.  We will check a 2-week event monitor.

## 2017-09-04 NOTE — Assessment & Plan Note (Signed)
History of hyperlipidemia on statin therapy.  We will recheck a lipid and liver profile today

## 2017-09-04 NOTE — Assessment & Plan Note (Signed)
History of PAF status post DC cardioversion 2004.  She is had no recurrence since that time.  She does feel some palpitations but has never had documented A. fib recently.

## 2017-09-04 NOTE — Assessment & Plan Note (Signed)
History of recent episodes of atypical chest pain with risk factors that include treated hypertension, hyperlipidemia and 35-pack-year history tobacco abuse.  She is unable to exercise.  We will check a pharmacologic Myoview stress test and 2D echocardiogram.

## 2017-09-04 NOTE — Progress Notes (Signed)
09/04/2017 Taylor Li   Dec 01, 1950  [id number]  Primary Physician Unk Pinto, MD Primary Cardiologist: [name] MD Lupe Carney, [place]  HPI:  Taylor Li is a 67 y.o. mildly overweight married Caucasian female mother of 2, grandmother of 3 grandchildren referred by Dr. Melford Aase for cardiovascular evaluation because of chest pain and palpitations.  She is a Insurance claims handler.  Her cardiac risk factors include treated hypertension, hyper hyperlipidemia and discontinued tobacco abuse having smoked 35 pack years.  She is noticed palpitations the last several months and has had EKGs that showed ventricular bigeminy.  She is also had several episodes of atypical chest pain.  A recent chest CT done for "cancer screening" on 07/21/2017 did show 4.3 cm ascending thoracic aortic aneurysm without evidence of coronary calcification.   Current Meds  Medication Sig  . ALPRAZolam (XANAX) 1 MG tablet Take 1/2-1 tab up to 3 times a day as needed for severe anxiety. Avoid taking daily.  . Ascorbic Acid (VITAMIN C) 1000 MG tablet Take 1,000 mg by mouth daily.  Marland Kitchen aspirin 81 MG tablet Take 81 mg by mouth daily.  Marland Kitchen BLACK PEPPER-TURMERIC PO Take by mouth.  Marland Kitchen buPROPion (WELLBUTRIN XL) 300 MG 24 hr tablet TAKE 1 TABLET BY MOUTH EVERY DAY  . Cholecalciferol (VITAMIN D PO) Take 5,000 Units by mouth daily.  . cyclobenzaprine (FLEXERIL) 10 MG tablet TAKE 1/2 TO 1 TABLET BY MOUTH 3 TIMES A DAY AS NEEDED AS NEEDED FOR MUSCLE SPASM  . diltiazem (CARDIZEM CD) 180 MG 24 hr capsule Take 1 capsule each morning for BP & heart skips  . fexofenadine (ALLEGRA) 180 MG tablet Take 180 mg by mouth daily.  . hydrochlorothiazide (MICROZIDE) 12.5 MG capsule Take 1 to 2 caps daily for BP & Fluid Retention  . Multiple Vitamin (MULTIVITAMIN) tablet Take 1 tablet by mouth daily.  . Probiotic Product (PROBIOTIC PO) Take 1 capsule by mouth as needed.   . rosuvastatin (CRESTOR) 40 MG tablet Take 1/2 to 1 tablet daily  or as directed for Cholesterol     No Known Allergies  Social History   Socioeconomic History  . Marital status: Married    Spouse name: Not on file  . Number of children: Not on file  . Years of education: Not on file  . Highest education level: Not on file  Occupational History  . Occupation: Therapist, sports  Social Needs  . Financial resource strain: Not on file  . Food insecurity:    Worry: Not on file    Inability: Not on file  . Transportation needs:    Medical: Not on file    Non-medical: Not on file  Tobacco Use  . Smoking status: Former Smoker    Last attempt to quit: 04/18/2012    Years since quitting: 5.3  . Smokeless tobacco: Current User  . Tobacco comment: E-Cigarretts  Substance and Sexual Activity  . Alcohol use: Yes    Alcohol/week: 3.0 standard drinks    Types: 3 Standard drinks or equivalent per week    Comment: social  . Drug use: No  . Sexual activity: Not on file  Lifestyle  . Physical activity:    Days per week: Not on file    Minutes per session: Not on file  . Stress: Not on file  Relationships  . Social connections:    Talks on phone: Not on file    Gets together: Not on file    Attends religious service: Not on  file    Active member of club or organization: Not on file    Attends meetings of clubs or organizations: Not on file    Relationship status: Not on file  . Intimate partner violence:    Fear of current or ex partner: Not on file    Emotionally abused: Not on file    Physically abused: Not on file    Forced sexual activity: Not on file  Other Topics Concern  . Not on file  Social History Narrative   Married, Therapist, sports   Former tobacco smoker now Patent attorney   + EtOH and caffeine   No drugs     Review of Systems: General: negative for chills, fever, night sweats or weight changes.  Cardiovascular: negative for chest pain, dyspnea on exertion, edema, orthopnea, palpitations, paroxysmal nocturnal dyspnea or shortness of breath Dermatological:  negative for rash Respiratory: negative for cough or wheezing Urologic: negative for hematuria Abdominal: negative for nausea, vomiting, diarrhea, bright red blood per rectum, melena, or hematemesis Neurologic: negative for visual changes, syncope, or dizziness All other systems reviewed and are otherwise negative except as noted above.    Blood pressure 104/80, pulse 78, height 5\' 8"  (1.727 m), weight 195 lb (88.5 kg).  General appearance: alert and no distress Neck: no adenopathy, no carotid bruit, no JVD, supple, symmetrical, trachea midline and thyroid not enlarged, symmetric, no tenderness/mass/nodules Lungs: clear to auscultation bilaterally Heart: regular rate and rhythm, S1, S2 normal, no murmur, click, rub or gallop Extremities: extremities normal, atraumatic, no cyanosis or edema Pulses: 2+ and symmetric Skin: Skin color, texture, turgor normal. No rashes or lesions Neurologic: Alert and oriented X 3, normal strength and tone. Normal symmetric reflexes. Normal coordination and gait  EKG not performed today  ASSESSMENT AND PLAN:   Essential hypertension History of essential hypertension with blood pressure measured at 104/80.  She is on diltiazem and hydrochlorothiazide.  Continue current meds at current dosing.  Hyperlipidemia History of hyperlipidemia on statin therapy.  We will recheck a lipid and liver profile today  Paroxysmal Afib History of PAF status post DC cardioversion 2004.  She is had no recurrence since that time.  She does feel some palpitations but has never had documented A. fib recently.  PVC's (premature ventricular contractions) History of recent palpitations with EKG that shows bigeminal PVCs in the past.  She has cut out her caffeine the last 3 weeks.  She is under a lot of stress from the loss of her mother February 2018 with ongoing grief.  We will check a 2-week event monitor.  Atypical chest pain History of recent episodes of atypical chest pain  with risk factors that include treated hypertension, hyperlipidemia and 35-pack-year history tobacco abuse.  She is unable to exercise.  We will check a pharmacologic Myoview stress test and 2D echocardiogram.  Thoracic aortic aneurysm ([place]) Recent chest CTA performed for cancer screening 07/21/2017 revealed a 4.3 cm ascending thoracic aortic aneurysm which will need to be followed on an annual basis.  Notably, there was no mention of coronary calcification.      [name] MD FACP,FACC,FAHA, [facility] 09/04/2017 9:36 AM

## 2017-09-04 NOTE — Assessment & Plan Note (Signed)
History of essential hypertension with blood pressure measured at 104/80.  She is on diltiazem and hydrochlorothiazide.  Continue current meds at current dosing.

## 2017-09-04 NOTE — Assessment & Plan Note (Signed)
Recent chest CTA performed for cancer screening 07/21/2017 revealed a 4.3 cm ascending thoracic aortic aneurysm which will need to be followed on an annual basis.  Notably, there was no mention of coronary calcification.

## 2017-09-04 NOTE — Patient Instructions (Signed)
Medication Instructions:   NO CHANGE  Labwork:  Your physician recommends that you HAVE LAB WORK TODAY  Testing/Procedures:  Your physician has requested that you have an echocardiogram. Echocardiography is a painless test that uses sound waves to create images of your heart. It provides your doctor with information about the size and shape of your heart and how well your heart's chambers and valves are working. This procedure takes approximately one hour. There are no restrictions for this procedure.   Your physician has recommended that you wear a 2 WEEK event monitor. Event monitors are medical devices that record the heart's electrical activity. Doctors most often [place] these monitors to diagnose arrhythmias. Arrhythmias are problems with the speed or rhythm of the heartbeat. The monitor is a small, portable device. You can wear one while you do your normal daily activities. This is usually used to diagnose what is causing palpitations/syncope (passing out).   Your physician has requested that you have a lexiscan myoview. For further information please visit [email or web address]. Please follow instruction sheet, as given.    Follow-Up:  Your physician recommends that you schedule a follow-up appointment in: WITH DR BERRY AFTER TESTING COMPLETE

## 2017-09-08 ENCOUNTER — Telehealth (HOSPITAL_COMMUNITY): Payer: Self-pay

## 2017-09-08 NOTE — Telephone Encounter (Signed)
Encounter complete. 

## 2017-09-09 ENCOUNTER — Ambulatory Visit (INDEPENDENT_AMBULATORY_CARE_PROVIDER_SITE_OTHER): Payer: Medicare Other

## 2017-09-09 ENCOUNTER — Telehealth (HOSPITAL_COMMUNITY): Payer: Self-pay

## 2017-09-09 ENCOUNTER — Other Ambulatory Visit: Payer: Self-pay

## 2017-09-09 ENCOUNTER — Ambulatory Visit (HOSPITAL_COMMUNITY): Payer: Medicare Other | Attending: Cardiology

## 2017-09-09 DIAGNOSIS — R002 Palpitations: Secondary | ICD-10-CM

## 2017-09-09 DIAGNOSIS — R072 Precordial pain: Secondary | ICD-10-CM | POA: Diagnosis not present

## 2017-09-09 NOTE — Telephone Encounter (Signed)
Encounter complete. 

## 2017-09-10 ENCOUNTER — Ambulatory Visit (HOSPITAL_COMMUNITY)
Admission: RE | Admit: 2017-09-10 | Discharge: 2017-09-10 | Disposition: A | Discharge status: Home / Self Care | Payer: Medicare Other | Source: Ambulatory Visit | Attending: Cardiology | Admitting: Cardiology

## 2017-09-10 DIAGNOSIS — R072 Precordial pain: Secondary | ICD-10-CM | POA: Diagnosis not present

## 2017-09-10 LAB — MYOCARDIAL PERFUSION IMAGING
CHL CUP NUCLEAR SDS: 1
CHL CUP RESTING HR STRESS: 85 {beats}/min
LVDIAVOL: 96 mL (ref 46–106)
LVSYSVOL: 38 mL
NUC STRESS TID: 1.17
Peak HR: 114 {beats}/min
SRS: 0
SSS: 1

## 2017-09-10 MED ORDER — TECHNETIUM TC 99M TETROFOSMIN IV KIT
24.5000 | PACK | Freq: Once | INTRAVENOUS | Status: AC | PRN
  Administered 2017-09-10: 24.5 via INTRAVENOUS
  Filled 2017-09-10: qty 25

## 2017-09-10 MED ORDER — REGADENOSON 0.4 MG/5ML IV SOLN
0.4000 mg | Freq: Once | INTRAVENOUS | Status: AC
  Administered 2017-09-10: 0.4 mg via INTRAVENOUS

## 2017-09-10 MED ORDER — AMINOPHYLLINE 25 MG/ML IV SOLN
25.0000 mg | Freq: Once | INTRAVENOUS | Status: AC
  Administered 2017-09-10: 25 mg via INTRAVENOUS

## 2017-09-10 MED ORDER — TECHNETIUM TC 99M TETROFOSMIN IV KIT
7.5000 | PACK | Freq: Once | INTRAVENOUS | Status: AC | PRN
  Administered 2017-09-10: 7.5 via INTRAVENOUS
  Filled 2017-09-10: qty 8

## 2017-09-10 MED ORDER — AMINOPHYLLINE 25 MG/ML IV SOLN
75.0000 mg | Freq: Once | INTRAVENOUS | Status: AC
  Administered 2017-09-10: 75 mg via INTRAVENOUS

## 2017-09-11 ENCOUNTER — Other Ambulatory Visit: Payer: Self-pay | Admitting: *Deleted

## 2017-09-11 DIAGNOSIS — I712 Thoracic aortic aneurysm, without rupture, unspecified: Secondary | ICD-10-CM

## 2017-09-15 ENCOUNTER — Other Ambulatory Visit: Payer: Self-pay

## 2017-09-15 ENCOUNTER — Institutional Professional Consult (permissible substitution) (INDEPENDENT_AMBULATORY_CARE_PROVIDER_SITE_OTHER): Payer: Medicare Other | Admitting: Thoracic Surgery (Cardiothoracic Vascular Surgery)

## 2017-09-15 ENCOUNTER — Encounter: Payer: Self-pay | Admitting: Thoracic Surgery (Cardiothoracic Vascular Surgery)

## 2017-09-15 VITALS — BP 130/87 | HR 88 | Resp 18 | Ht 68.0 in | Wt 195.0 lb

## 2017-09-15 DIAGNOSIS — I7121 Aneurysm of the ascending aorta, without rupture: Secondary | ICD-10-CM

## 2017-09-15 DIAGNOSIS — I712 Thoracic aortic aneurysm, without rupture: Secondary | ICD-10-CM

## 2017-09-15 NOTE — Progress Notes (Signed)
PCP is Unk Pinto, MD Referring Provider is Unk Pinto, MD  Chief Complaint  Patient presents with  . Thoracic Aortic Aneurysm    Surgical eval, Chest CT 07/21/2017, ECHO 09/04/2017    HPI: [name] sent for consultation regarding an ascending aortic aneurysm  Taylor Li is a 67 year old woman with a past medical history significant for hypertension, hyperlipidemia, atrial fibrillation, and glucose intolerance.  She also has a history of "collapsed lung", arthritis, anxiety, colon polyp, vitamin D deficiency, and C. difficile diarrhea.  She has a history of tobacco abuse.  She smoked about a pack a day for 30 years before quitting about 5 or 6 years ago.  She did use E cigarettes for a while but stopped that after a couple of years.  She saw Dr. Melford Aase for her annual checkup earlier this year.  He recommended a low-dose screening CT due to her smoking history.  That showed no suspicious lung nodules, but she was noted to have a 4.3 cm ascending thoracic aorta.  She has had an extensive cardiac evaluation because of chest tightness and palpitations.  She currently has an event monitor on.  An echocardiogram last week showed grade 1 diastolic dysfunction, normal LV function and mild aortic dilatation.  Stress test was negative for ischemia but did show frequent PVCs.  She does complain of some tightness in her chest and palpitations.  She is concerned that it could be atrial fibrillation.  She is anxious.  She also has arthritis and joint swelling.  She has occasional lightheadedness but no frank vertigo or syncope.   Past Medical History:  Diagnosis Date  . A-fib ([place])   . Arthritis   . C. difficile diarrhea   . Hyperlipidemia   . Hypertension   . Personal history of colonic polyp- adenoma 09/22/2013  . Pre-diabetes   . Vitamin D deficiency     Past Surgical History:  Procedure Laterality Date  . APPENDECTOMY    . BREAST SURGERY Bilateral 1983   SQ mastectomies  .  CARDIAC DEFIBRILLATOR PLACEMENT    . CARDIOVERSION    . CHOLECYSTECTOMY    . COLONOSCOPY    . LAMINECTOMY  1982   L4-L5  . NASAL SEPTUM SURGERY    . TONSILLECTOMY    . VAGINAL HYSTERECTOMY  1985    Family History  Problem Relation Age of Onset  . Hypertension Father   . Lung cancer Father   . Thyroid disease Father   . Hypertension Mother   . Barrett's esophagus Mother   . Colon cancer Neg Hx     Social History Social History   Tobacco Use  . Smoking status: Former Smoker    Last attempt to quit: 04/18/2012    Years since quitting: 5.4  . Smokeless tobacco: Current User  . Tobacco comment: E-Cigarretts  Substance Use Topics  . Alcohol use: Yes    Alcohol/week: 3.0 standard drinks    Types: 3 Standard drinks or equivalent per week    Comment: social  . Drug use: No    Current Outpatient Medications  Medication Sig Dispense Refill  . ALPRAZolam (XANAX) 1 MG tablet Take 1/2-1 tab up to 3 times a day as needed for severe anxiety. Avoid taking daily. 90 tablet 0  . Ascorbic Acid (VITAMIN C) 1000 MG tablet Take 1,000 mg by mouth daily.    Marland Kitchen aspirin 81 MG tablet Take 81 mg by mouth daily.    Marland Kitchen BLACK PEPPER-TURMERIC PO Take by mouth.    Marland Kitchen  buPROPion (WELLBUTRIN XL) 300 MG 24 hr tablet TAKE 1 TABLET BY MOUTH EVERY DAY 90 tablet 3  . Cholecalciferol (VITAMIN D PO) Take 5,000 Units by mouth daily.    . cyclobenzaprine (FLEXERIL) 10 MG tablet TAKE 1/2 TO 1 TABLET BY MOUTH 3 TIMES A DAY AS NEEDED AS NEEDED FOR MUSCLE SPASM 90 tablet 3  . diltiazem (CARDIZEM CD) 180 MG 24 hr capsule Take 1 capsule each morning for BP & heart skips 90 capsule 3  . fexofenadine (ALLEGRA) 180 MG tablet Take 180 mg by mouth daily.    . hydrochlorothiazide (MICROZIDE) 12.5 MG capsule Take 1 to 2 caps daily for BP & Fluid Retention 180 capsule 1  . Multiple Vitamin (MULTIVITAMIN) tablet Take 1 tablet by mouth daily.    . Probiotic Product (PROBIOTIC PO) Take 1 capsule by mouth as needed.     .  rosuvastatin (CRESTOR) 40 MG tablet Take 1/2 to 1 tablet daily or as directed for Cholesterol 90 tablet 1  . telmisartan (MICARDIS) 40 MG tablet Take 40 mg by mouth daily.     No current facility-administered medications for this visit.     No Known Allergies  Review of Systems  Constitutional: Negative for activity change and unexpected weight change.  HENT: Negative for trouble swallowing and voice change.   Respiratory: Positive for chest tightness. Negative for shortness of breath.   Cardiovascular: Positive for chest pain (Tightness) and palpitations.  Genitourinary: Negative for difficulty urinating and dysuria.  Musculoskeletal: Positive for arthralgias and joint swelling.  Neurological: Positive for light-headedness. Negative for dizziness and syncope.  Hematological: Negative for adenopathy. Does not bruise/bleed easily.  Psychiatric/Behavioral: The patient is nervous/anxious.   All other systems reviewed and are negative.   BP 130/87 (BP Location: Right Arm, Patient Position: Sitting, Cuff Size: Large)   Pulse 88   Resp 18   Ht 5\' 8"  (1.727 m)   Wt 195 lb (88.5 kg)   SpO2 97% Comment: RA  BMI 29.65 kg/m  Physical Exam  Constitutional: She is oriented to person, place, and time. She appears well-developed and well-nourished. No distress.  HENT:  Head: Normocephalic and atraumatic.  Mouth/Throat: No oropharyngeal exudate.  Eyes: Conjunctivae and EOM are normal. No scleral icterus.  Neck: Normal range of motion. Neck supple. No thyromegaly present.  Cardiovascular: Normal heart sounds and intact distal pulses. Exam reveals no gallop and no friction rub.  No murmur heard. Irregular rate  Pulmonary/Chest: Effort normal and breath sounds normal. No respiratory distress. She has no wheezes.  Abdominal: Soft. There is no tenderness.  Musculoskeletal: She exhibits no edema or deformity.  Lymphadenopathy:    She has no cervical adenopathy.  Neurological: She is alert and  oriented to person, place, and time. No cranial nerve deficit. She exhibits normal muscle tone. Coordination normal.  Skin: Skin is dry.  Vitals reviewed.    Diagnostic Tests: CT CHEST WITHOUT CONTRAST LOW-DOSE FOR LUNG CANCER SCREENING  TECHNIQUE: Multidetector CT imaging of the chest was performed following the standard protocol without IV contrast.  COMPARISON:  No comparison studies available.  FINDINGS: Cardiovascular: The heart size is normal. No substantial pericardial effusion. Coronary artery calcification is evident. Ascending thoracic aorta measures 4.3 cm diameter.  Mediastinum/Nodes: No mediastinal lymphadenopathy. No evidence for gross hilar lymphadenopathy although assessment is limited by the lack of intravenous contrast on today's study. The esophagus has normal imaging features. There is no axillary lymphadenopathy.  Lungs/Pleura: The central tracheobronchial airways are patent. Biapical pleuroparenchymal scarring evident.  Calcified granuloma identified left upper lobe. Chronic atelectasis or scarring evident at the left base. Tiny nodule in the anterior right upper lobe (image 244) has volume derived equivalent diameter of 2.5 mm. No overtly suspicious pulmonary nodule or mass.  Upper Abdomen: Unremarkable.  Musculoskeletal: No worrisome lytic or sclerotic osseous abnormality.  IMPRESSION: 1. Lung-RADS 2, benign appearance or behavior. Continue annual screening with low-dose chest CT without contrast in 12 months. 2. Ascending thoracic aorta measures 4.3 cm diameter consistent with aneurysm. Recommend annual imaging followup by CTA or MRA. This recommendation follows 2010 ACCF/AHA/AATS/ACR/ASA/SCA/SCAI/SIR/STS/SVM Guidelines for the Diagnosis and Management of Patients with Thoracic Aortic Disease. Circulation. 2010; 121: I153-P943   Electronically Signed   By: Misty Stanley M.D.   On: 07/21/2017 15:48 I personally reviewed the CT images  and concur with the findings noted above  Impression: Taylor Li is a 67 year old woman with a history of tobacco abuse, hypertension, hyperlipidemia and atrial fibrillation.  She recently had a low-dose screening CT for lung cancer due to her smoking history.  This showed no suspicious lung nodules.  However it did show a 4.3 cm ascending aneurysm.  Echocardiogram to evaluate chest tightness and palpitations also showed a dilated aortic root.  There is no indication for surgery at this time.  She does need annual follow-up.  That would best be done with CT angiogram.  That can also be used to monitor for any suspicious lung nodules.  Tobacco abuse-smoking 5 to 6 years ago.  Abstinence was emphasized.  Hypertension-pressure control is adequate today at 130/87.  She understands that blood pressure control is important for decreasing the risk of aneurysmal expansion.  Plan: Return in 1 year with CT angiogram of chest  Melrose Nakayama, MD [facility] [phone]

## 2017-09-22 ENCOUNTER — Encounter: Payer: Medicare Other | Admitting: Thoracic Surgery (Cardiothoracic Vascular Surgery)

## 2017-09-29 ENCOUNTER — Ambulatory Visit: Payer: Self-pay | Admitting: Adult Health

## 2017-09-30 ENCOUNTER — Ambulatory Visit (INDEPENDENT_AMBULATORY_CARE_PROVIDER_SITE_OTHER): Payer: Medicare Other | Admitting: Cardiovascular Disease

## 2017-09-30 ENCOUNTER — Encounter: Payer: Self-pay | Admitting: Cardiovascular Disease

## 2017-09-30 DIAGNOSIS — I493 Ventricular premature depolarization: Secondary | ICD-10-CM | POA: Diagnosis not present

## 2017-09-30 MED ORDER — METOPROLOL SUCCINATE ER 25 MG PO TB24: 25.0000 mg | ORAL_TABLET | Freq: Every day | ORAL | 3 refills | Status: DC

## 2017-09-30 MED ORDER — ROSUVASTATIN CALCIUM 5 MG PO TABS: 5.0000 mg | ORAL_TABLET | ORAL | 1 refills | Status: DC

## 2017-09-30 MED ORDER — DILTIAZEM HCL ER COATED BEADS 120 MG PO CP24: 120.0000 mg | ORAL_CAPSULE | Freq: Every day | ORAL | 3 refills | Status: DC

## 2017-09-30 NOTE — Patient Instructions (Addendum)
Medication Instructions:  Your physician has recommended you make the following change in your medication:  1) DECREASE Cardizem to 120 mg tablet by mouth ONCE daily 2) START Toprol XL 25 mg tablet by mouth ONCE daily 3) STOP Crestor for 2-4 weeks until you feel better, then START Crestor 5 mg tablet by mouth ONCE weekly   Labwork: none  Testing/Procedures: none  Follow-Up: Your physician recommends that you schedule a follow-up appointment in: Dr. Gwenlyn Found in 3 months  Your physician recommends that you schedule a follow-up appointment in: Early December with [facility]      Any Other Special Instructions Will Be Listed Below (If Applicable).     If you need a refill on your cardiac medications before your next appointment, please call your pharmacy.

## 2017-09-30 NOTE — Progress Notes (Signed)
Taylor Li returns today for follow-up.  Her 2D echo was entirely normal except for a dilated thoracic aorta measuring 45 mm.  A Myoview stress test was normal as well.  Her event monitor showed PVCs.  Her thoracic aorta measures 4.3 cm and she has seen Dr. Roxan Hockey for this.  I am going to explore Repatha because of statin intolerance.  I am also going to decrease her Cardizem from 180 mg to 120 mg daily and add Toprol-XL 25 for symptomatic PVCs.  I will see her back in 3 months.  [name], M.D., [place], [place], [place] [address] [place], [place]  [address]  [phone] 09/30/2017 12:17 PM

## 2017-09-30 NOTE — Assessment & Plan Note (Signed)
History of hyperlipidemia on 20 mg of Crestor 3 days a week with a favorable lipid profile and LDL 77 but with statin side effects including myalgias and arthralgias.  We will explore the possibility of starting Repatha.

## 2017-09-30 NOTE — Assessment & Plan Note (Addendum)
Multiple PVCs on event monitoring with a short run of nonsustained V. tach versus accelerated junctional rhythm.  I am going to decrease her Cardizem CD from  180-1 20 and begin Toprol-XL 25.  Her 2D echo was entirely normal.

## 2017-10-06 ENCOUNTER — Other Ambulatory Visit: Payer: Self-pay | Admitting: Pharmacist Clinician (PhC)/ Clinical Pharmacy Specialist

## 2017-10-06 ENCOUNTER — Ambulatory Visit: Payer: Self-pay | Admitting: Adult Health

## 2017-10-06 MED ORDER — ALIROCUMAB 75 MG/ML ~~LOC~~ SOPN: 75.0000 mg | PEN_INJECTOR | SUBCUTANEOUS | 12 refills | Status: DC

## 2017-10-06 NOTE — Telephone Encounter (Signed)
Insurance covers Praluent 75 mg - pt to determine copay

## 2017-10-12 ENCOUNTER — Other Ambulatory Visit: Payer: Self-pay | Admitting: Pharmacist Clinician (PhC)/ Clinical Pharmacy Specialist

## 2017-10-12 MED ORDER — ALIROCUMAB 75 MG/ML ~~LOC~~ SOPN: 75.0000 mg | PEN_INJECTOR | SUBCUTANEOUS | 12 refills | Status: DC

## 2017-10-14 ENCOUNTER — Encounter: Payer: Self-pay | Admitting: Pharmacist Clinician (PhC)/ Clinical Pharmacy Specialist

## 2017-10-14 NOTE — Progress Notes (Signed)
MEDICARE ANNUAL WELLNESS VISIT AND FOLLOW UP  Assessment:   Diagnoses and all orders for this visit:  Welcome to Medicare preventive visit EKG AAA screening - smoker, known hx of aneurysm  Thoracic aortic aneurysm without rupture ([place]) Control BP, followed by Dr. Roxan Hockey Monitor annually - next due 07/2018  PVC's (premature ventricular contractions) Monitor, followed by Dr. Gwenlyn Found  Paroxysmal atrial fibrillation [facility]) Last documented remote; recently controlled post CV Continue to monitor closely, continue rate controlling medications  Essential hypertension Continue medication Monitor blood pressure at home; call if consistently over 130/80 Continue DASH diet.   Reminder to go to the ER if any CP, SOB, nausea, dizziness, severe HA, changes vision/speech, left arm numbness and tingling and jaw pain.  Chronic obstructive pulmonary disease, unspecified COPD type ([place]) Asymptomatic, monitor  Vitamin D deficiency Continue supplementation Check vitamin D level  Personal history of colonic polyp- adenoma Follow up colonoscopy due 2020  Overweight (BMI 25.0-29.9) Long discussion about weight loss, diet, and exercise Recommended diet heavy in fruits and veggies and low in animal meats, cheeses, and dairy products, appropriate calorie intake Discussed appropriate weight for height Follow up at next visit  Other abnormal glucose Recent A1Cs at goal Discussed diet/exercise, weight management  Defer A1C; check CMP  Medication management CBC, CMP/GFR  Hyperlipidemia Continue medications Continue low cholesterol diet and exercise.  Check lipid panel.   Atypical chest pain Not having recently Workup by Dr. Gwenlyn Found unremarkable  Anxiety and depression Well managed by current regimen; continue medications, continue working on tapering down benzo to avoid daily use Stress management techniques discussed, increase water, good sleep hygiene discussed, increase exercise,  and increase veggies.    Over 40 minutes of exam, counseling, chart review and critical decision making was performed Future Appointments  Date Time Provider [place]  12/29/2017 10:00 AM [facility] PHARMACIST [facility]  12/29/2017 10:45 AM [name], MD [facility]  01/11/2018  2:30 PM Unk Pinto, MD [facility] None  07/05/2018  2:00 PM Unk Pinto, MD [facility] None     Plan:   During the course of the visit the patient was educated and counseled about appropriate screening and preventive services including:    Pneumococcal vaccine   Prevnar 13  Influenza vaccine  Td vaccine  Screening electrocardiogram  Bone densitometry screening  Colorectal cancer screening  Diabetes screening  Glaucoma screening  Nutrition counseling   Advanced directives: requested   Subjective:  Taylor Li is a 67 y.o. female who presents for Medicare Annual Wellness Visit and 3 month follow up. She has remote pAfib in 2002 & 2004 (Req CV), recently had atypical CP and was evaluated by Dr. Gwenlyn Found, Her 2D echo was entirely normal except for a dilated thoracic aorta measuring 43 mm (has followed up with Dr. Roxan Hockey).  A Myoview stress test was normal as well.  Her event monitor showed PVCs.    she has a diagnosis of depression/anxiety and is currently on wellbutrin 300 mg daily, PRN xanax, reports symptoms are fairly well controlled on current regimen. she currently takes 1 at night for sleep, trying to cut down.   BMI is Body mass index is 30.56 kg/m., she has been working on diet and exercise. Wt Readings from Last 3 Encounters:  10/15/17 201 lb (91.2 kg)  09/30/17 197 lb 3.2 oz (89.4 kg)  09/15/17 195 lb (88.5 kg)    Her blood pressure has been controlled at home, today their BP is BP: 114/80 She does not workout. She  denies chest pain, shortness of breath, dizziness.   She is not on cholesterol medication, taking triple omega 3  supplement (rosuvastatin and alirocumab via cardiology, but cannot afford, severe myalgias with statin) and denies myalgias. Her cholesterol is at goal. The cholesterol last visit was:   Lab Results  Component Value Date   CHOL 171 09/04/2017   HDL 78 09/04/2017   LDLCALC 77 09/04/2017   TRIG 79 09/04/2017   CHOLHDL 2.2 09/04/2017    She has not been working on diet and exercise for glucose management, and denies foot ulcerations, increased appetite, nausea, paresthesia of the feet, polydipsia, polyuria and visual disturbances. Last A1C in the office was:  Lab Results  Component Value Date   HGBA1C 5.5 06/15/2017   Last GFR: Lab Results  Component Value Date   GFRNONAA 79 06/15/2017   Patient is newly on Vitamin D supplement.   Lab Results  Component Value Date   VD25OH 28 (L) 06/15/2017      Medication Review: Current Outpatient Medications on File Prior to Visit  Medication Sig Dispense Refill  . Alirocumab (PRALUENT) 75 MG/ML SOPN Inject 75 mg into the skin every 14 (fourteen) days. 2 pen 12  . ALPRAZolam (XANAX) 1 MG tablet Take 1/2-1 tab up to 3 times a day as needed for severe anxiety. Avoid taking daily. 90 tablet 0  . Ascorbic Acid (VITAMIN C) 1000 MG tablet Take 1,000 mg by mouth daily.    Marland Kitchen aspirin 81 MG tablet Take 81 mg by mouth daily.    Marland Kitchen BLACK PEPPER-TURMERIC PO Take by mouth.    Marland Kitchen buPROPion (WELLBUTRIN XL) 300 MG 24 hr tablet TAKE 1 TABLET BY MOUTH EVERY DAY 90 tablet 3  . Cholecalciferol (VITAMIN D PO) Take 5,000 Units by mouth daily.    . cyclobenzaprine (FLEXERIL) 10 MG tablet TAKE 1/2 TO 1 TABLET BY MOUTH 3 TIMES A DAY AS NEEDED AS NEEDED FOR MUSCLE SPASM 90 tablet 3  . diltiazem (CARDIZEM CD) 120 MG 24 hr capsule Take 1 capsule (120 mg total) by mouth daily. 90 capsule 3  . hydrochlorothiazide (MICROZIDE) 12.5 MG capsule Take 1 to 2 caps daily for BP & Fluid Retention 180 capsule 1  . metoprolol succinate (TOPROL XL) 25 MG 24 hr tablet Take 1 tablet (25 mg  total) by mouth daily. 90 tablet 3  . Multiple Vitamin (MULTIVITAMIN) tablet Take 1 tablet by mouth daily.    . Probiotic Product (PROBIOTIC PO) Take 1 capsule by mouth as needed.     Marland Kitchen telmisartan (MICARDIS) 40 MG tablet Take 40 mg by mouth daily.     No current facility-administered medications on file prior to visit.     Allergies  Allergen Reactions  . Rosuvastatin     myalgias    Current Problems (verified) Patient Active Problem List   Diagnosis Date Noted  . PVC's (premature ventricular contractions) 09/04/2017  . Atypical chest pain 09/04/2017  . Thoracic aortic aneurysm ([place]) 09/04/2017  . Overweight (BMI 25.0-29.9) 03/10/2017  . COPD (chronic obstructive pulmonary disease) ([place]) 09/29/2016  . Anxiety and depression 12/06/2013  . Personal history of colonic polyp- adenoma 09/22/2013  . Medication management 09/09/2013  . Essential hypertension 01/14/2013  . Hyperlipidemia 01/14/2013  . Vitamin D deficiency 01/14/2013  . Other abnormal glucose 01/14/2013  . Paroxysmal Afib 01/14/2013    Screening Tests Immunization History  Administered Date(s) Administered  . DTaP 01/13/2001  . Influenza Inj Mdck Quad With Preservative 09/30/2016  . Influenza Whole 10/06/2012  .  Influenza,inj,quad, With Preservative 10/18/2015  . Influenza-Unspecified 11/11/2013, 11/15/2014  . PPD Test 01/14/2013, 04/17/2014, 04/16/2015, 05/15/2016  . Pneumococcal Conjugate-13 06/15/2017  . Pneumococcal Polysaccharide-23 01/13/2001  . Tdap 10/18/2015  . Zoster 11/19/2009    Preventative care: Last colonoscopy: 09/2013 - repeat 2020 per Dr. Carlean Purl Last mammogram: has implants, last 2015, declines Last pap smear/pelvic exam: hysterectomy   DEXA: 2011, patient req to defer to next  Prior vaccinations: TD or Tdap: 2017  Influenza: 2018  Pneumococcal: 2003, due 06/2018 Prevnar13: 06/2017 Shingles/Zostavax: 2011  Names of Other Physician/Practitioners you currently use: 1. [facility] here for primary care 2. [place], eye doctor, last visit 2019 3. Dr, Lavonne Chick dentist, last visit 2017  Patient Care Team: Unk Pinto, MD as PCP - General (Internal Medicine) [name], MD as PCP - Cardiology (Cardiology)  SURGICAL HISTORY She  has a past surgical history that includes Laminectomy (1982); Breast surgery (Bilateral, 1983); Vaginal hysterectomy (1985); Tonsillectomy; Nasal septum surgery; Cardioversion; Colonoscopy; Appendectomy; Cholecystectomy; and Cardiac defibrillator placement. FAMILY HISTORY Her family history includes Barrett's esophagus in her mother; Hypertension in her father and mother; Lung cancer in her father; Thyroid disease in her father. SOCIAL HISTORY She  reports that she quit smoking about 5 years ago. She has quit using smokeless tobacco. She reports that she drinks about 3.0 standard drinks of alcohol per week. She reports that she does not use drugs.   MEDICARE WELLNESS OBJECTIVES: Physical activity: Current Exercise Habits: The patient does not participate in regular exercise at present, Exercise limited by: None identified Cardiac risk factors: Cardiac Risk Factors include: advanced age (>57men, >57 women);dyslipidemia;hypertension;smoking/ tobacco exposure Depression/mood screen:   Depression screen [facility] 2/9 10/15/2017  Decreased Interest 0  Down, Depressed, Hopeless 1  PHQ - 2 Score 1    ADLs:  In your present state of health, do you have any difficulty performing the following activities: 10/15/2017 08/22/2017  Hearing? N N  Vision? N N  Difficulty concentrating or making decisions? N N  Walking or climbing stairs? N N  Dressing or bathing? N N  Doing errands, shopping? N N  Some recent data might be hidden     Cognitive Testing  Alert? Yes  Normal Appearance?Yes  Oriented to person? Yes  Place? Yes   Time? Yes  Recall of three objects?  Yes  Can perform simple calculations?  Yes  Displays appropriate judgment?Yes  Can read the correct time from a watch face?Yes  EOL planning: Does Patient Have a Medical Advance Directive?: Yes Type of Advance Directive: Healthcare Power of Attorney, Living will Does patient want to make changes to medical advance directive?: No - Patient declined Copy of [place] in Chart?: No - copy requested  Review of Systems  Constitutional: Negative for malaise/fatigue and weight loss.  HENT: Negative for hearing loss and tinnitus.   Eyes: Negative for blurred vision and double vision.  Respiratory: Negative for cough, sputum production, shortness of breath and wheezing.   Cardiovascular: Negative for chest pain, palpitations, orthopnea, claudication, leg swelling and PND.  Gastrointestinal: Negative for abdominal pain, blood in stool, constipation, diarrhea, heartburn, melena, nausea and vomiting.  Genitourinary: Negative.   Musculoskeletal: Negative for falls, joint pain and myalgias.  Skin: Negative for rash.  Neurological: Negative for dizziness, tingling, sensory change, weakness and headaches.  Endo/Heme/Allergies: Negative for polydipsia.  Psychiatric/Behavioral: Negative.  Negative for depression, memory loss, substance abuse and suicidal ideas. The patient is not nervous/anxious and  does not have insomnia.   All other systems reviewed and are negative.    Objective:     Today's Vitals   10/15/17 1355  BP: 114/80  Pulse: 68  Temp: (!) 96.8 F (36 C)  SpO2: 97%  Weight: 201 lb (91.2 kg)  Height: 5\' 8"  (1.727 m)   Body mass index is 30.56 kg/m.  General appearance: alert, no distress, WD/WN, female HEENT: normocephalic, sclerae anicteric, TMs pearly, nares patent, no discharge or erythema, pharynx normal Oral cavity: MMM, no lesions Neck: supple, no lymphadenopathy, no thyromegaly, no masses Heart: RRR, normal S1, S2, no murmurs Lungs: CTA bilaterally, no wheezes, rhonchi, or rales Abdomen:  +bs, soft, non tender, non distended, no masses, no hepatomegaly, no splenomegaly Musculoskeletal: nontender, no swelling, no obvious deformity Extremities: no edema, no cyanosis, no clubbing Pulses: 2+ symmetric, upper and lower extremities, normal cap refill Neurological: alert, oriented x 3, CN2-12 intact, strength normal upper extremities and lower extremities, sensation normal throughout, DTRs 2+ throughout, no cerebellar signs, gait normal Psychiatric: normal affect, behavior normal, pleasant   Medicare Attestation I have personally reviewed: The patient's medical and social history Their use of alcohol, tobacco or illicit drugs Their current medications and supplements The patient's functional ability including ADLs,fall risks, home safety risks, cognitive, and hearing and visual impairment Diet and physical activities Evidence for depression or mood disorders  The patient's weight, height, BMI, and visual acuity have been recorded in the chart.  I have made referrals, counseling, and provided education to the patient based on review of the above and I have provided the patient with a written personalized care plan for preventive services.     Izora Ribas, NP   10/15/2017

## 2017-10-15 ENCOUNTER — Ambulatory Visit (INDEPENDENT_AMBULATORY_CARE_PROVIDER_SITE_OTHER): Payer: Medicare Other | Admitting: Adult Health

## 2017-10-15 ENCOUNTER — Encounter: Payer: Self-pay | Admitting: Adult Health

## 2017-10-15 VITALS — BP 114/80 | HR 68 | Temp 96.8°F | Ht 68.0 in | Wt 201.0 lb

## 2017-10-15 DIAGNOSIS — R0789 Other chest pain: Secondary | ICD-10-CM | POA: Diagnosis not present

## 2017-10-15 DIAGNOSIS — Z23 Encounter for immunization: Secondary | ICD-10-CM | POA: Diagnosis not present

## 2017-10-15 DIAGNOSIS — Z8679 Personal history of other diseases of the circulatory system: Secondary | ICD-10-CM

## 2017-10-15 DIAGNOSIS — R7309 Other abnormal glucose: Secondary | ICD-10-CM

## 2017-10-15 DIAGNOSIS — I480 Paroxysmal atrial fibrillation: Secondary | ICD-10-CM

## 2017-10-15 DIAGNOSIS — R6889 Other general symptoms and signs: Secondary | ICD-10-CM | POA: Diagnosis not present

## 2017-10-15 DIAGNOSIS — F329 Major depressive disorder, single episode, unspecified: Secondary | ICD-10-CM

## 2017-10-15 DIAGNOSIS — Z136 Encounter for screening for cardiovascular disorders: Secondary | ICD-10-CM

## 2017-10-15 DIAGNOSIS — Z860100 Personal history of colon polyps, unspecified: Secondary | ICD-10-CM

## 2017-10-15 DIAGNOSIS — F32A Depression, unspecified: Secondary | ICD-10-CM

## 2017-10-15 DIAGNOSIS — Z0001 Encounter for general adult medical examination with abnormal findings: Secondary | ICD-10-CM

## 2017-10-15 DIAGNOSIS — I493 Ventricular premature depolarization: Secondary | ICD-10-CM

## 2017-10-15 DIAGNOSIS — E663 Overweight: Secondary | ICD-10-CM

## 2017-10-15 DIAGNOSIS — I712 Thoracic aortic aneurysm, without rupture, unspecified: Secondary | ICD-10-CM

## 2017-10-15 DIAGNOSIS — Z87891 Personal history of nicotine dependence: Secondary | ICD-10-CM

## 2017-10-15 DIAGNOSIS — E559 Vitamin D deficiency, unspecified: Secondary | ICD-10-CM

## 2017-10-15 DIAGNOSIS — Z79899 Other long term (current) drug therapy: Secondary | ICD-10-CM

## 2017-10-15 DIAGNOSIS — F419 Anxiety disorder, unspecified: Secondary | ICD-10-CM | POA: Diagnosis not present

## 2017-10-15 DIAGNOSIS — I10 Essential (primary) hypertension: Secondary | ICD-10-CM

## 2017-10-15 DIAGNOSIS — J449 Chronic obstructive pulmonary disease, unspecified: Secondary | ICD-10-CM | POA: Diagnosis not present

## 2017-10-15 DIAGNOSIS — E782 Mixed hyperlipidemia: Secondary | ICD-10-CM

## 2017-10-15 DIAGNOSIS — Z0000 Encounter for general adult medical examination without abnormal findings: Secondary | ICD-10-CM

## 2017-10-15 MED ORDER — ROSUVASTATIN CALCIUM 5 MG PO TABS: 5.0000 mg | ORAL_TABLET | ORAL | 1 refills | Status: DC

## 2017-10-15 NOTE — Patient Instructions (Addendum)
  [name] , Thank you for taking time to come for your Medicare Wellness Visit. I appreciate your ongoing commitment to your health goals. Please review the following plan we discussed and let me know if I can assist you in the future.   These are the goals we discussed: Goals    . LDL CALC < 70    . Weight (lb) < 190 lb (86.2 kg)       This is a list of the screening recommended for you and due dates:  Health Maintenance  Topic Date Due  . Mammogram  04/16/2015  . Flu Shot  10/22/2017*  . Pneumonia vaccines (2 of 2 - PPSV23) 06/16/2018  . Colon Cancer Screening  09/17/2018  . Tetanus Vaccine  10/17/2025  . DEXA scan (bone density measurement)  Completed  .  Hepatitis C: One time screening is recommended by Center for Disease Control  (CDC) for  adults born from [birth year] through 1963.   Completed  . HIV Screening  Completed  . Pap Smear  Discontinued  *Topic was postponed. The date shown is not the original due date.    Know what a healthy weight is for you (roughly BMI <25) and aim to maintain this  Aim for 7+ servings of fruits and vegetables daily  65-80+ fluid ounces of water or unsweet tea for healthy kidneys  Limit to max 1 drink of alcohol per day; avoid smoking/tobacco  Limit animal fats in diet for cholesterol and heart health - choose grass fed whenever available  Avoid highly processed foods, and foods high in saturated/trans fats  Aim for low stress - take time to unwind and care for your mental health  Aim for 150 min of moderate intensity exercise weekly for heart health, and weights twice weekly for bone health  Aim for 7-9 hours of sleep daily

## 2017-10-16 LAB — VITAMIN D 25 HYDROXY (VIT D DEFICIENCY, FRACTURES): Vit D, 25-Hydroxy: 49 ng/mL (ref 30–100)

## 2017-10-16 LAB — CBC WITH DIFFERENTIAL/PLATELET
BASOS ABS: 30 {cells}/uL (ref 0–200)
Basophils Relative: 0.4 %
EOS PCT: 1.8 %
Eosinophils Absolute: 133 cells/uL (ref 15–500)
HCT: 42.8 % (ref 35.0–45.0)
Hemoglobin: 14.3 g/dL (ref 11.7–15.5)
Lymphs Abs: 2694 cells/uL (ref 850–3900)
MCH: 29.7 pg (ref 27.0–33.0)
MCHC: 33.4 g/dL (ref 32.0–36.0)
MCV: 88.8 fL (ref 80.0–100.0)
MONOS PCT: 8 %
MPV: 11 fL (ref 7.5–12.5)
NEUTROS ABS: 3952 {cells}/uL (ref 1500–7800)
NEUTROS PCT: 53.4 %
PLATELETS: 271 10*3/uL (ref 140–400)
RBC: 4.82 10*6/uL (ref 3.80–5.10)
RDW: 12.3 % (ref 11.0–15.0)
TOTAL LYMPHOCYTE: 36.4 %
WBC mixed population: 592 cells/uL (ref 200–950)
WBC: 7.4 10*3/uL (ref 3.8–10.8)

## 2017-10-16 LAB — COMPLETE METABOLIC PANEL WITH GFR
AG Ratio: 1.8 (calc) (ref 1.0–2.5)
ALT: 15 U/L (ref 6–29)
AST: 14 U/L (ref 10–35)
Albumin: 4.5 g/dL (ref 3.6–5.1)
Alkaline phosphatase (APISO): 68 U/L (ref 33–130)
BUN: 19 mg/dL (ref 7–25)
CALCIUM: 9.6 mg/dL (ref 8.6–10.4)
CO2: 26 mmol/L (ref 20–32)
CREATININE: 0.77 mg/dL (ref 0.50–0.99)
Chloride: 104 mmol/L (ref 98–110)
GFR, EST NON AFRICAN AMERICAN: 81 mL/min/{1.73_m2} (ref >=60)
GFR, Est African American: 94 mL/min/{1.73_m2} (ref >=60)
GLOBULIN: 2.5 g/dL (ref 1.9–3.7)
GLUCOSE: 82 mg/dL (ref 65–99)
Potassium: 4 mmol/L (ref 3.5–5.3)
SODIUM: 139 mmol/L (ref 135–146)
Total Bilirubin: 0.7 mg/dL (ref 0.2–1.2)
Total Protein: 7 g/dL (ref 6.1–8.1)

## 2017-10-16 LAB — TSH: TSH: 1.07 m[IU]/L (ref 0.40–4.50)

## 2017-10-16 LAB — MAGNESIUM: Magnesium: 2 mg/dL (ref 1.5–2.5)

## 2017-10-19 ENCOUNTER — Other Ambulatory Visit: Payer: Self-pay | Admitting: Internal Medicine

## 2017-10-19 DIAGNOSIS — I10 Essential (primary) hypertension: Secondary | ICD-10-CM

## 2017-10-20 ENCOUNTER — Other Ambulatory Visit: Payer: Self-pay | Admitting: Cardiovascular Disease

## 2017-11-13 ENCOUNTER — Other Ambulatory Visit: Payer: Self-pay | Admitting: Adult Health

## 2017-11-13 ENCOUNTER — Other Ambulatory Visit: Payer: Self-pay | Admitting: Internal Medicine

## 2017-11-13 DIAGNOSIS — F411 Generalized anxiety disorder: Secondary | ICD-10-CM

## 2017-11-16 ENCOUNTER — Other Ambulatory Visit: Payer: Self-pay | Admitting: Internal Medicine

## 2017-11-16 MED ORDER — PROBIOTIC PO CAPS: ORAL_CAPSULE | ORAL | 3 refills | Status: DC

## 2017-11-22 ENCOUNTER — Other Ambulatory Visit: Payer: Self-pay | Admitting: Cardiovascular Disease

## 2017-12-22 ENCOUNTER — Telehealth: Payer: Self-pay

## 2017-12-22 DIAGNOSIS — E782 Mixed hyperlipidemia: Secondary | ICD-10-CM

## 2017-12-22 NOTE — Telephone Encounter (Signed)
Called pt left msg regarding needing labs for lipid panel for next weeks appt with the pharmacist at the lipid clinic

## 2017-12-22 NOTE — Telephone Encounter (Signed)
Pt called and stated that they never took praluent that they were taking crestor 40mg  3x's a week down to 5mg  3x's a week. Pt called to change appt

## 2017-12-22 NOTE — Telephone Encounter (Signed)
Called pt to schedule lipid panel for pharmacy visit pt is compliant and said that they would come in for that. Pt stated that they discontinued taking the praulent because it was causing severe muscle & joint pain. Pt restarted the crestor 5mg  3x's a week.

## 2017-12-22 NOTE — Telephone Encounter (Signed)
Rescheduled pt for Dr. Gwenlyn Found and the Pharmacist for January and told the pt to come have labs done a week before they come to the first appt

## 2017-12-22 NOTE — Addendum Note (Signed)
Addended by: Allean Found on: 12/22/2017 03:27 PM   Modules accepted: Orders

## 2017-12-29 ENCOUNTER — Ambulatory Visit: Payer: Medicare Other

## 2017-12-29 ENCOUNTER — Ambulatory Visit: Payer: Medicare Other | Admitting: Cardiovascular Disease

## 2018-01-11 ENCOUNTER — Ambulatory Visit: Payer: Self-pay | Admitting: Internal Medicine

## 2018-01-21 ENCOUNTER — Encounter: Payer: Self-pay | Admitting: Internal Medicine

## 2018-01-21 NOTE — Progress Notes (Signed)
This very nice 68 y.o. MWF presents for 6 month follow up with HTN, HLD, Pre-Diabetes and Vitamin D Deficiency.      Patient is treated for HTN (2002) & BP has been controlled at home. Today's BP is at goal - 126/80.  Patient has a remote hx/o pAfib in 2002 & 2004 (req CV) . Recent 2D echo & Stress Myoview were Normal in Sept 2019.  Dr Alvester Chou had adjusted med med decreasing Diltiazem and adding low dose Metoprolol for concern of PVC's.  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 Patient has had no complaints of any cardiac type chest pain, palpitations, dyspnea / orthopnea / PND, dizziness, claudication, or dependent edema.     Hyperlipidemia is near controlled with diet & meds. Patient denies myalgias or other med SE's. Currently she is on low dose alternate day Crestor. Last Lipids were near goal: Lab Results  Component Value Date   CHOL 193 01/22/2018   HDL 77 01/22/2018   LDLCALC 97 01/22/2018   TRIG 96 01/22/2018   CHOLHDL 2.5 01/22/2018      Also, the patient has history of PreDiabetes (A1c 5.8% / 2011 and 5.9% / 2011) and has had no symptoms of reactive hypoglycemia, diabetic polys, paresthesias or visual blurring.  Last A1c was Normal & at goal: Lab Results  Component Value Date   HGBA1C 5.6 01/22/2018      Further, the patient also has history of Vitamin D Deficiency ("36 " / 2010)  and supplements vitamin D without any suspected side-effects. Last vitamin D was not at goal (70-100): Lab Results  Component Value Date   VD25OH 51 01/22/2018   Current Outpatient Medications on File Prior to Visit  Medication Sig  . ALPRAZolam (XANAX) 1 MG tablet Take 1/2-1 tablet 2 - 3 x /day ONLY if needed for Anxiety Attack &  limit to 5 days /week to avoid addiction  . Ascorbic Acid (VITAMIN C) 1000 MG tablet Take 1,000 mg by mouth daily.  Marland Kitchen aspirin 81 MG tablet Take 81 mg by mouth daily.  Marland Kitchen BLACK  PEPPER-TURMERIC PO Take by mouth.  Marland Kitchen buPROPion (WELLBUTRIN XL) 300 MG 24 hr tablet TAKE 1 TABLET BY MOUTH EVERY DAY  . Cholecalciferol (VITAMIN D PO) Take 5,000 Units by mouth daily.  . Coenzyme Q10 (COQ-10) 100 MG CAPS Take 2 capsules by mouth daily.  . cyclobenzaprine (FLEXERIL) 10 MG tablet TAKE 1/2 TO 1 TABLET BY MOUTH 3 TIMES A DAY AS NEEDED AS NEEDED FOR MUSCLE SPASM  . hydrochlorothiazide (MICROZIDE) 12.5 MG capsule TAKE 1 CAPSULE (12.5 MG TOTAL) BY MOUTH DAILY.((MAX PER INSURANCE IS 30 DAYS))  . Magnesium 500 MG TABS Take by mouth. Takes 1/2 tablets daily.  . metoprolol succinate (TOPROL XL) 25 MG 24 hr tablet Take 1 tablet (25 mg total) by mouth daily.  . Multiple Vitamin (MULTIVITAMIN) tablet Take 1 tablet by mouth daily.  Marland Kitchen OVER THE COUNTER MEDICATION Takes Metamucil as needed.  . Probiotic CAPS Take 1 capsule daily  . rosuvastatin (CRESTOR) 5 MG tablet TAKE 1 TABLET BY MOUTH 1-3 TIMES A WEEK AS TOLERATED.  Marland Kitchen telmisartan (MICARDIS) 40 MG tablet TAKE 1/2 TO 1 TABLET EVERY NIGHT  . diltiazem (CARDIZEM CD) 120 MG 24 hr capsule Take 1 capsule (120 mg total) by mouth daily.   No current facility-administered medications on file prior to visit.    Allergies  Allergen  Reactions  . Rosuvastatin     myalgias   PMHx:   Past Medical History:  Diagnosis Date  . A-fib ([place])   . Arthritis   . C. difficile diarrhea   . Hyperlipidemia   . Hypertension   . Personal history of colonic polyp- adenoma 09/22/2013  . Pre-diabetes   . Vitamin D deficiency    Immunization History  Administered Date(s) Administered  . DTaP 01/13/2001  . Influenza Inj Mdck Quad With Preservative 09/30/2016  . Influenza Whole 10/06/2012  . Influenza, High Dose Seasonal PF 10/15/2017  . Influenza,inj,quad, With Preservative 10/18/2015  . Influenza-Unspecified 11/11/2013, 11/15/2014  . PPD Test 01/14/2013, 04/17/2014, 04/16/2015, 05/15/2016  . Pneumococcal Conjugate-13 06/15/2017  . Pneumococcal  Polysaccharide-23 01/13/2001  . Tdap 10/18/2015  . Zoster 11/19/2009   Past Surgical History:  Procedure Laterality Date  . APPENDECTOMY    . BREAST SURGERY Bilateral 1983   SQ mastectomies  . CARDIAC DEFIBRILLATOR PLACEMENT    . CARDIOVERSION    . CHOLECYSTECTOMY    . COLONOSCOPY    . LAMINECTOMY  1982   L4-L5  . NASAL SEPTUM SURGERY    . TONSILLECTOMY    . VAGINAL HYSTERECTOMY  1985   FHx:    Reviewed / unchanged  SHx:    Reviewed / unchanged   Systems Review:  Constitutional: Denies fever, chills, wt changes, headaches, insomnia, fatigue, night sweats, change in appetite. Eyes: Denies redness, blurred vision, diplopia, discharge, itchy, watery eyes.  ENT: Denies discharge, congestion, post nasal drip, epistaxis, sore throat, earache, hearing loss, dental pain, tinnitus, vertigo, sinus pain, snoring.  CV: Denies chest pain, palpitations, irregular heartbeat, syncope, dyspnea, diaphoresis, orthopnea, PND, claudication or edema. Respiratory: denies cough, dyspnea, DOE, pleurisy, hoarseness, laryngitis, wheezing.  Gastrointestinal: Denies dysphagia, odynophagia, heartburn, reflux, water brash, abdominal pain or cramps, nausea, vomiting, bloating, diarrhea, constipation, hematemesis, melena, hematochezia  or hemorrhoids. Genitourinary: Denies dysuria, frequency, urgency, nocturia, hesitancy, discharge, hematuria or flank pain. Musculoskeletal: Denies arthralgias, myalgias, stiffness, jt. swelling, pain, limping or strain/sprain.  Skin: Denies pruritus, rash, hives, warts, acne, eczema or change in skin lesion(s). Neuro: No weakness, tremor, incoordination, spasms, paresthesia or pain. Psychiatric: Denies confusion, memory loss or sensory loss. Endo: Denies change in weight, skin or hair change.  Heme/Lymph: No excessive bleeding, bruising or enlarged lymph nodes.  Physical Exam  BP 126/80   Pulse 72   Temp (!) 97 F (36.1 C)   Resp 16   Ht 5\' 8"  (1.727 m)   Wt 196 lb 9.6  oz (89.2 kg)   BMI 29.89 kg/m   Appears  well nourished, well groomed  and in no distress.  Eyes: PERRLA, EOMs, conjunctiva no swelling or erythema. Sinuses: No frontal/maxillary tenderness ENT/Mouth: EAC's clear, TM's nl w/o erythema, bulging. Nares clear w/o erythema, swelling, exudates. Oropharynx clear without erythema or exudates. Oral hygiene is good. Tongue normal, non obstructing. Hearing intact.  Neck: Supple. Thyroid not palpable. Car 2+/2+ without bruits, nodes or JVD. Chest: Respirations nl with BS clear & equal w/o rales, rhonchi, wheezing or stridor.  Cor: Heart sounds normal w/ regular rate and rhythm without sig. murmurs, gallops, clicks or rubs. Peripheral pulses normal and equal  without edema.  Abdomen: Soft & bowel sounds normal. Non-tender w/o guarding, rebound, hernias, masses or organomegaly.  Lymphatics: Unremarkable.  Musculoskeletal: Full ROM all peripheral extremities, joint stability, 5/5 strength and normal gait.  Skin: Warm, dry without exposed rashes, lesions or ecchymosis apparent.  Neuro: Cranial nerves intact, reflexes equal bilaterally. Sensory-motor testing  grossly intact. Tendon reflexes grossly intact.  Pysch: Alert & oriented x 3.  Insight and judgement nl & appropriate. No ideations.  Assessment and Plan:  1. Essential hypertension  - Continue medication, monitor blood pressure at home.  - Continue DASH diet.  Reminder to go to the ER if any CP,  SOB, nausea, dizziness, severe HA, changes vision/speech.  - CBC with Differential/Platelet - COMPLETE METABOLIC PANEL WITH GFR - Magnesium - TSH  2. Hyperlipidemia, mixed  - Continue diet/meds, exercise,& lifestyle modifications.  - Continue monitor periodic cholesterol/liver & renal functions   - Lipid panel - TSH  3. Abnormal glucose  - Continue diet, exercise  - Lifestyle modifications.  - Monitor appropriate labs.  - Hemoglobin A1c - Insulin, random  4. Vitamin D deficiency  -  Continue supplementation.   - VITAMIN D 25 Hydroxyl  5. Prediabetes  - Hemoglobin A1c - Insulin, random  6. Medication management  - CBC with Differential/Platelet - COMPLETE METABOLIC PANEL WITH GFR - Magnesium - Lipid panel - TSH - Hemoglobin A1c - Insulin, random - VITAMIN D 25 Hydroxyl       Discussed  regular exercise, BP monitoring, weight control to achieve/maintain BMI less than 25 and discussed med and SE's. Recommended labs to assess and monitor clinical status with further disposition pending results of labs. Over 30 minutes of exam, counseling, chart review was performed.

## 2018-01-21 NOTE — Patient Instructions (Signed)

## 2018-01-22 ENCOUNTER — Ambulatory Visit (INDEPENDENT_AMBULATORY_CARE_PROVIDER_SITE_OTHER): Payer: Medicare Other | Admitting: Internal Medicine

## 2018-01-22 VITALS — BP 126/80 | HR 72 | Temp 97.0°F | Resp 16 | Ht 68.0 in | Wt 196.6 lb

## 2018-01-22 DIAGNOSIS — R7309 Other abnormal glucose: Secondary | ICD-10-CM | POA: Diagnosis not present

## 2018-01-22 DIAGNOSIS — I10 Essential (primary) hypertension: Secondary | ICD-10-CM | POA: Diagnosis not present

## 2018-01-22 DIAGNOSIS — E782 Mixed hyperlipidemia: Secondary | ICD-10-CM

## 2018-01-22 DIAGNOSIS — R7303 Prediabetes: Secondary | ICD-10-CM

## 2018-01-22 DIAGNOSIS — E559 Vitamin D deficiency, unspecified: Secondary | ICD-10-CM | POA: Diagnosis not present

## 2018-01-22 DIAGNOSIS — Z79899 Other long term (current) drug therapy: Secondary | ICD-10-CM | POA: Diagnosis not present

## 2018-01-25 LAB — CBC WITH DIFFERENTIAL/PLATELET
ABSOLUTE MONOCYTES: 554 {cells}/uL (ref 200–950)
BASOS ABS: 31 {cells}/uL (ref 0–200)
Basophils Relative: 0.4 %
EOS ABS: 109 {cells}/uL (ref 15–500)
Eosinophils Relative: 1.4 %
HCT: 42.8 % (ref 35.0–45.0)
Hemoglobin: 14.4 g/dL (ref 11.7–15.5)
Lymphs Abs: 2722 cells/uL (ref 850–3900)
MCH: 30.1 pg (ref 27.0–33.0)
MCHC: 33.6 g/dL (ref 32.0–36.0)
MCV: 89.4 fL (ref 80.0–100.0)
MPV: 10.9 fL (ref 7.5–12.5)
Monocytes Relative: 7.1 %
NEUTROS PCT: 56.2 %
Neutro Abs: 4384 cells/uL (ref 1500–7800)
PLATELETS: 292 10*3/uL (ref 140–400)
RBC: 4.79 10*6/uL (ref 3.80–5.10)
RDW: 12.8 % (ref 11.0–15.0)
TOTAL LYMPHOCYTE: 34.9 %
WBC: 7.8 10*3/uL (ref 3.8–10.8)

## 2018-01-25 LAB — COMPLETE METABOLIC PANEL WITH GFR
AG Ratio: 1.8 (calc) (ref 1.0–2.5)
ALBUMIN MSPROF: 4.6 g/dL (ref 3.6–5.1)
ALT: 18 U/L (ref 6–29)
AST: 16 U/L (ref 10–35)
Alkaline phosphatase (APISO): 67 U/L (ref 33–130)
BILIRUBIN TOTAL: 0.5 mg/dL (ref 0.2–1.2)
BUN: 17 mg/dL (ref 7–25)
CO2: 27 mmol/L (ref 20–32)
CREATININE: 0.84 mg/dL (ref 0.50–0.99)
Calcium: 9.8 mg/dL (ref 8.6–10.4)
Chloride: 100 mmol/L (ref 98–110)
GFR, EST AFRICAN AMERICAN: 85 mL/min/{1.73_m2} (ref >=60)
GFR, Est Non African American: 73 mL/min/{1.73_m2} (ref >=60)
Globulin: 2.5 g/dL (calc) (ref 1.9–3.7)
Glucose, Bld: 91 mg/dL (ref 65–99)
Potassium: 4 mmol/L (ref 3.5–5.3)
Sodium: 137 mmol/L (ref 135–146)
TOTAL PROTEIN: 7.1 g/dL (ref 6.1–8.1)

## 2018-01-25 LAB — LIPID PANEL
Cholesterol: 193 mg/dL (ref <200)
HDL: 77 mg/dL (ref >50)
LDL CHOLESTEROL (CALC): 97 mg/dL
NON-HDL CHOLESTEROL (CALC): 116 mg/dL (ref <130)
TRIGLYCERIDES: 96 mg/dL (ref <150)
Total CHOL/HDL Ratio: 2.5 (calc) (ref <5.0)

## 2018-01-25 LAB — HEMOGLOBIN A1C
Hgb A1c MFr Bld: 5.6 % of total Hgb (ref <5.7)
Mean Plasma Glucose: 114 (calc)
eAG (mmol/L): 6.3 (calc)

## 2018-01-25 LAB — VITAMIN D 25 HYDROXY (VIT D DEFICIENCY, FRACTURES): VIT D 25 HYDROXY: 51 ng/mL (ref 30–100)

## 2018-01-25 LAB — TSH: TSH: 1.11 mIU/L (ref 0.40–4.50)

## 2018-01-25 LAB — MAGNESIUM: MAGNESIUM: 1.7 mg/dL (ref 1.5–2.5)

## 2018-01-25 LAB — INSULIN, RANDOM: Insulin: 8.1 u[IU]/mL (ref 2.0–19.6)

## 2018-01-27 ENCOUNTER — Ambulatory Visit: Payer: Medicare Other

## 2018-02-02 ENCOUNTER — Ambulatory Visit: Payer: Medicare Other | Admitting: Cardiovascular Disease

## 2018-02-06 ENCOUNTER — Other Ambulatory Visit: Payer: Self-pay | Admitting: Cardiovascular Disease

## 2018-02-10 ENCOUNTER — Other Ambulatory Visit: Payer: Self-pay | Admitting: Internal Medicine

## 2018-02-10 ENCOUNTER — Other Ambulatory Visit: Payer: Self-pay | Admitting: Adult Health

## 2018-02-10 DIAGNOSIS — F411 Generalized anxiety disorder: Secondary | ICD-10-CM

## 2018-03-02 ENCOUNTER — Other Ambulatory Visit: Payer: Self-pay | Admitting: Cardiovascular Disease

## 2018-03-03 NOTE — Telephone Encounter (Signed)
Rx(s) sent to pharmacy electronically.  

## 2018-03-15 ENCOUNTER — Other Ambulatory Visit: Payer: Self-pay | Admitting: Physician Assistant

## 2018-03-28 ENCOUNTER — Other Ambulatory Visit: Payer: Self-pay | Admitting: Adult Health

## 2018-03-28 DIAGNOSIS — F411 Generalized anxiety disorder: Secondary | ICD-10-CM

## 2018-04-14 ENCOUNTER — Other Ambulatory Visit: Payer: Self-pay | Admitting: Internal Medicine

## 2018-04-14 DIAGNOSIS — E782 Mixed hyperlipidemia: Secondary | ICD-10-CM

## 2018-04-14 DIAGNOSIS — I10 Essential (primary) hypertension: Secondary | ICD-10-CM

## 2018-04-15 ENCOUNTER — Other Ambulatory Visit: Payer: Self-pay | Admitting: Internal Medicine

## 2018-04-15 MED ORDER — ROSUVASTATIN CALCIUM 5 MG PO TABS: ORAL_TABLET | ORAL | 3 refills | Status: DC

## 2018-04-16 ENCOUNTER — Other Ambulatory Visit: Payer: Self-pay | Admitting: Internal Medicine

## 2018-04-16 MED ORDER — ROSUVASTATIN CALCIUM 5 MG PO TABS: ORAL_TABLET | ORAL | 3 refills | Status: DC

## 2018-04-24 ENCOUNTER — Other Ambulatory Visit: Payer: Self-pay | Admitting: Internal Medicine

## 2018-04-24 DIAGNOSIS — E782 Mixed hyperlipidemia: Secondary | ICD-10-CM

## 2018-04-24 MED ORDER — ROSUVASTATIN CALCIUM 5 MG PO TABS: ORAL_TABLET | ORAL | 3 refills | Status: DC

## 2018-05-06 ENCOUNTER — Other Ambulatory Visit: Payer: Self-pay | Admitting: Internal Medicine

## 2018-05-06 NOTE — Progress Notes (Deleted)
FOLLOW UP  Assessment and Plan:   Atrial fib (Taylor Li) No known episodes after CV 2002/2004 Normal rate and rhythm today Continue follow up cardiology as recommended  Hypertension Well controlled with current medications  Monitor blood pressure at home; patient to call if consistently greater than 130/80 Continue DASH diet.   Reminder to go to the ER if any CP, SOB, nausea, dizziness, severe HA, changes vision/speech, left arm numbness and tingling and jaw pain.  Cholesterol Currently at goal on rosuvastatin 5 mg daily  Continue low cholesterol diet and exercise.  Check lipid panel.   Other abnormal glucose Recent A1Cs at goal Continue diet and exercise.  Perform daily foot/skin check, notify office of any concerning changes.  Defer A1C; check CMP  Overweight Long discussion about weight loss, diet, and exercise Recommended diet heavy in fruits and veggies and low in animal meats, cheeses, and dairy products, appropriate calorie intake Discussed ideal weight for height - goal weight for next visit 190 lb Patient will work on  Will follow up in 3 months  Depression/anxiety continue with wellbutrin and PRN xanax - has decreased use significantly. Encouraged to avoid daily use if possible.  Lifestyle discussed: diet/exerise, sleep hygiene, stress management, hydration  Vitamin D Def/ osteoporosis prevention Near goal at last check; continue to recommend supplementation for goal of 60-100 *** vitamin D level  Continue diet and meds as discussed. Further disposition pending results of labs. Discussed med's effects and SE's.   Over 30 minutes of exam, counseling, chart review, and critical decision making was performed.   Future Appointments  Date Time Provider [place]  05/10/2018 10:45 AM Liane Comber, NP [facility] None  08/12/2018  2:00 PM Unk Pinto, MD [facility] None  11/12/2018  9:30 AM Liane Comber, NP [facility] None     ----------------------------------------------------------------------------------------------------------------------  HPI 68 y.o. female  presents for 3 month follow up on hypertension, cholesterol, glucose management, weight, depression/anxiety and vitamin D deficiency.    She has hx/o pAfib (2002/2004)  S/p CV and a negative Cardiolite in 2011 without further known episodes. On bASA.   ? COPD  she has a diagnosis of depression/anxiety and is currently on wellbutrin 300 mg mg daily and xanax 1 mg TID PRN, reports symptoms are well controlled on current regimen. she reports she takes 1 at night, but hasn't needed during the day these days.   Goal weight 190 lb ***  BMI is There is no height or weight on file to calculate BMI., she has not been working on diet and exercise but plans to start soon. Plans to start working out with her husband.  Wt Readings from Last 3 Encounters:  01/22/18 196 lb 9.6 oz (89.2 kg)  10/15/17 201 lb (91.2 kg)  09/30/17 197 lb 3.2 oz (89.4 kg)   Her blood pressure has been controlled at home, today their BP is    She does not workout. She denies chest pain, shortness of breath, dizziness.   She is on cholesterol medication (rosuvastatin 5 mg daily) and denies myalgias. Her cholesterol is at goal. The cholesterol last visit was:   Lab Results  Component Value Date   CHOL 193 01/22/2018   HDL 77 01/22/2018   LDLCALC 97 01/22/2018   TRIG 96 01/22/2018   CHOLHDL 2.5 01/22/2018    She has not been working on diet and exercise for glucose management, and denies increased appetite, nausea, paresthesia of the feet, polydipsia, polyuria, visual disturbances, vomiting and weight loss. Last A1C in the  office was:  Lab Results  Component Value Date   HGBA1C 5.6 01/22/2018   Patient is on Vitamin D supplement and near goal at most recent check:    Lab Results  Component Value Date   VD25OH 51 01/22/2018    She reports she has *** supplement since that  time.     Current Medications:  Current Outpatient Medications on File Prior to Visit  Medication Sig  . ALPRAZolam (XANAX) 1 MG tablet TAKE 1/2-1 TABLET, 2 - 3 X /DAY ONLY IF NEEDED FOR ANXIETY ATTACK & LIMIT TO 5 DAYS /WEEK TO AVOID ADDICTION  . Ascorbic Acid (VITAMIN C) 1000 MG tablet Take 1,000 mg by mouth daily.  Marland Kitchen aspirin 81 MG tablet Take 81 mg by mouth daily.  Marland Kitchen BLACK PEPPER-TURMERIC PO Take by mouth.  Marland Kitchen buPROPion (WELLBUTRIN XL) 300 MG 24 hr tablet TAKE 1 TABLET BY MOUTH EVERY DAY  . Cholecalciferol (VITAMIN D PO) Take 5,000 Units by mouth daily.  . Coenzyme Q10 (COQ-10) 100 MG CAPS Take 2 capsules by mouth daily.  . cyclobenzaprine (FLEXERIL) 10 MG tablet TAKE 1/2 TO 1 TABLET BY MOUTH 3 TIMES A DAY AS NEEDED AS NEEDED FOR MUSCLE SPASM  . diltiazem (CARDIZEM CD) 120 MG 24 hr capsule Take 1 capsule (120 mg total) by mouth daily.  . hydrochlorothiazide (MICROZIDE) 12.5 MG capsule TAKE 1 CAPSULE (12.5 MG TOTAL) BY MOUTH DAILY.((MAX PER INSURANCE IS 30 DAYS))  . Magnesium 500 MG TABS Take by mouth. Takes 1/2 tablets daily.  . metoprolol succinate (TOPROL XL) 25 MG 24 hr tablet Take 1 tablet (25 mg total) by mouth daily.  . Multiple Vitamin (MULTIVITAMIN) tablet Take 1 tablet by mouth daily.  Marland Kitchen OVER THE COUNTER MEDICATION Takes Metamucil as needed.  . Probiotic CAPS Take 1 capsule daily  . rosuvastatin (CRESTOR) 5 MG tablet Take 1 tablet daily for Cholesterol  . telmisartan (MICARDIS) 40 MG tablet TAKE 1/2-1 TABLET BY MOUTH EVERY NIGHT.   No current facility-administered medications on file prior to visit.      Allergies:  Allergies  Allergen Reactions  . Rosuvastatin     myalgias     Medical History:  Past Medical History:  Diagnosis Date  . A-fib ([place])   . Arthritis   . C. difficile diarrhea   . Hyperlipidemia   . Hypertension   . Personal history of colonic polyp- adenoma 09/22/2013  . Pre-diabetes   . Vitamin D deficiency    Family history- Reviewed and  unchanged Social history- Reviewed and unchanged   Review of Systems:  Review of Systems  Constitutional: Negative for malaise/fatigue and weight loss.  HENT: Negative for hearing loss and tinnitus.   Eyes: Negative for blurred vision and double vision.  Respiratory: Negative for cough, shortness of breath and wheezing.   Cardiovascular: Negative for chest pain, palpitations, orthopnea, claudication and leg swelling.  Gastrointestinal: Negative for abdominal pain, blood in stool, constipation, diarrhea, heartburn, melena, nausea and vomiting.  Genitourinary: Negative.   Musculoskeletal: Negative for joint pain and myalgias.  Skin: Negative for rash.  Neurological: Negative for dizziness, tingling, sensory change, weakness and headaches.  Endo/Heme/Allergies: Negative for polydipsia.  Psychiatric/Behavioral: Negative for depression, memory loss, substance abuse and suicidal ideas. The patient is nervous/anxious (Rare) and has insomnia (Difficulty falling sleep without xanax).   All other systems reviewed and are negative.   Physical Exam: There were no vitals taken for this visit. Wt Readings from Last 3 Encounters:  01/22/18 196 lb 9.6 oz (  89.2 kg)  10/15/17 201 lb (91.2 kg)  09/30/17 197 lb 3.2 oz (89.4 kg)   General Appearance: Well nourished, in no apparent distress. Eyes: PERRLA, EOMs, conjunctiva no swelling or erythema Sinuses: No Frontal/maxillary tenderness ENT/Mouth: Ext aud canals clear, TMs without erythema, bulging. No erythema, swelling, or exudate on post pharynx.  Tonsils not swollen or erythematous. Hearing normal.  Neck: Supple, thyroid normal.  Respiratory: Respiratory effort normal, BS equal bilaterally without rales, rhonchi, wheezing or stridor.  Cardio: RRR with no MRGs. Brisk peripheral pulses without edema.  Abdomen: Soft, + BS.  Non tender, no guarding, rebound, hernias, masses. Lymphatics: Non tender without lymphadenopathy.  Musculoskeletal: Full ROM,  5/5 strength, Normal gait Skin: Warm, dry without rashes, lesions, ecchymosis.  Neuro: Cranial nerves intact. No cerebellar symptoms.  Psych: Awake and oriented X 3, normal affect, Insight and Judgment appropriate.    Izora Ribas, NP 11:29 AM [facility]

## 2018-05-07 ENCOUNTER — Other Ambulatory Visit: Payer: Self-pay | Admitting: Internal Medicine

## 2018-05-07 DIAGNOSIS — I10 Essential (primary) hypertension: Secondary | ICD-10-CM

## 2018-05-07 MED ORDER — HYDROCHLOROTHIAZIDE 12.5 MG PO TABS: ORAL_TABLET | ORAL | 3 refills | Status: DC

## 2018-05-07 NOTE — Progress Notes (Signed)
Virtual Visit via Telephone Note  I connected with Taylor Li on 05/10/18 at 10:45 AM EDT by telephone and verified that I am speaking with the correct person using two identifiers.   I discussed the limitations, risks, security and privacy concerns of performing an evaluation and management service by telephone and the availability of in person appointments. I also discussed with the patient that there may be a patient responsible charge related to this service. The patient expressed understanding and agreed to proceed.   I discussed the assessment and treatment plan with the patient. The patient was provided an opportunity to ask questions and all were answered. The patient agreed with the plan and demonstrated an understanding of the instructions.   The patient was advised to call back or seek an in-person evaluation if the symptoms worsen or if the condition fails to improve as anticipated.  I provided 28 minutes of non-face-to-face time during this encounter.   Taylor Ribas, NP    FOLLOW UP  Assessment and Plan:   Atrial fib ([place]) No known episodes after CV 2002/2004 Normal rate and rhythm today Continue follow up cardiology as recommended  Hypertension Well controlled with current medications  Monitor blood pressure at home; patient to call if consistently greater than 130/80 Continue DASH diet.   Reminder to go to the ER if any CP, SOB, nausea, dizziness, severe HA, changes vision/speech, left arm numbness and tingling and jaw pain.  Cholesterol Currently at goal on rosuvastatin 5 mg three days a week Continue low cholesterol diet and exercise.  Check lipid panel.   Other abnormal glucose Recent A1Cs at goal Continue diet and exercise.  Perform daily foot/skin check, notify office of any concerning changes.  Defer A1C;   Overweight Long discussion about weight loss, diet, and exercise Recommended diet heavy in fruits and veggies and low in animal meats,  cheeses, and dairy products, appropriate calorie intake Discussed ideal weight for height - goal weight for next visit 190 lb Patient will work on portion control, walking more regularly   Will follow up in 3 months  Depression/anxiety continue with wellbutrin, celexa and PRN xanax- typically using in the evening, doing well. Encouraged to avoid daily use if possible.  Lifestyle discussed: diet/exerise, sleep hygiene, stress management, hydration  Vitamin D Def/ osteoporosis prevention Near goal at last check; continue to recommend supplementation for goal of 60-100 Defer vitamin D level to next OV  Continue diet and meds as discussed. Further disposition pending results of labs. Discussed med's effects and SE's.   Over 30 minutes of exam, counseling, chart review, and critical decision making was performed.   Future Appointments  Date Time Provider [place]  08/12/2018  2:00 PM Unk Pinto, MD [facility] None  11/12/2018  9:30 AM Liane Comber, NP [facility] None    ----------------------------------------------------------------------------------------------------------------------  HPI 68 y.o. female  presents for 3 month follow up on hypertension, cholesterol, glucose management, weight, depression/anxiety and vitamin D deficiency.    She has hx/o pAfib (2002/2004)  S/p CV and a negative Cardiolite in 2011 without further known episodes. On bASA.    She reports she is doing much better recently with chronic pain of hips and back fully resolved. She does follow with Dr. Berenice Primas.   she has a diagnosis of depression/anxiety and is currently on wellbutrin 300 mg mg daily, celexa 20 mg daily, and xanax 1 mg TID PRN, reports symptoms are well controlled on current regimen. she reports she takes 1 at night, but  hasn't needed during the day these days.   BMI is Body mass index is 30.11 kg/m., she has been working on diet and exercise but plans to start soon. She has a  treadmill but admits she hasn't been using very regularly. She admits she has been eating more while. She has goal weight of 190 lb.  Wt Readings from Last 3 Encounters:  05/10/18 198 lb (89.8 kg)  01/22/18 196 lb 9.6 oz (89.2 kg)  10/15/17 201 lb (91.2 kg)   Her blood pressure has been controlled at home, today their BP is BP: 118/82  She does not workout. She denies chest pain, shortness of breath, dizziness.   She is on cholesterol medication (rosuvastatin 5 mg three days a week) and denies myalgias. Her cholesterol is at goal. The cholesterol last visit was:   Lab Results  Component Value Date   CHOL 193 01/22/2018   HDL 77 01/22/2018   LDLCALC 97 01/22/2018   TRIG 96 01/22/2018   CHOLHDL 2.5 01/22/2018    She has not been working on diet and exercise for glucose management, and denies increased appetite, nausea, paresthesia of the feet, polydipsia, polyuria, visual disturbances, vomiting and weight loss. Last A1C in the office was:  Lab Results  Component Value Date   HGBA1C 5.6 01/22/2018   Patient is on Vitamin D supplement and near goal at most recent check:    Lab Results  Component Value Date   VD25OH 51 01/22/2018  Taking 5000 IU daily    Current Medications:  Current Outpatient Medications on File Prior to Visit  Medication Sig  . ALPRAZolam (XANAX) 1 MG tablet TAKE 1/2-1 TABLET, 2 - 3 X /DAY ONLY IF NEEDED FOR ANXIETY ATTACK & LIMIT TO 5 DAYS /WEEK TO AVOID ADDICTION  . Ascorbic Acid (VITAMIN C) 1000 MG tablet Take 1,000 mg by mouth daily.  Marland Kitchen aspirin 81 MG tablet Take 81 mg by mouth daily.  Marland Kitchen BLACK PEPPER-TURMERIC PO Take by mouth.  Marland Kitchen buPROPion (WELLBUTRIN XL) 300 MG 24 hr tablet TAKE 1 TABLET BY MOUTH EVERY DAY  . Cholecalciferol (VITAMIN D PO) Take 5,000 Units by mouth daily.  . citalopram (CELEXA) 40 MG tablet Take 40 mg by mouth daily. Takes 1/2 tablet daily  . cyclobenzaprine (FLEXERIL) 10 MG tablet TAKE 1/2 TO 1 TABLET BY MOUTH 3 TIMES A DAY AS NEEDED AS  NEEDED FOR MUSCLE SPASM  . diltiazem (CARDIZEM CD) 120 MG 24 hr capsule Take 1 capsule (120 mg total) by mouth daily.  Marland Kitchen Fexofenadine HCl (ALLEGRA PO) Take by mouth daily.  . hydrochlorothiazide (HYDRODIURIL) 12.5 MG tablet Take 1 tablet Daily for BP & Fluid Retention  . Magnesium 500 MG TABS Take by mouth. Takes 2 tablets daily  . metoprolol succinate (TOPROL XL) 25 MG 24 hr tablet Take 1 tablet (25 mg total) by mouth daily.  . Multiple Vitamin (MULTIVITAMIN) tablet Take 1 tablet by mouth daily.  . Omega-3 Fatty Acids (FISH OIL) 1000 MG CAPS Take by mouth daily.  Marland Kitchen OVER THE COUNTER MEDICATION Takes Metamucil as needed.  . rosuvastatin (CRESTOR) 5 MG tablet Take 1 tablet daily for Cholesterol (Patient taking differently: Take 1 tablet three times a week)  . telmisartan (MICARDIS) 40 MG tablet TAKE 1/2-1 TABLET BY MOUTH EVERY NIGHT.  Marland Kitchen Coenzyme Q10 (COQ-10) 100 MG CAPS Take 2 capsules by mouth daily.  . Probiotic CAPS Take 1 capsule daily (Patient not taking: Reported on 05/10/2018)   No current facility-administered medications on file prior  to visit.      Allergies:  Allergies  Allergen Reactions  . Rosuvastatin     myalgias     Medical History:  Past Medical History:  Diagnosis Date  . A-fib ([place])   . Arthritis   . C. difficile diarrhea   . Hyperlipidemia   . Hypertension   . Personal history of colonic polyp- adenoma 09/22/2013  . Pre-diabetes   . Vitamin D deficiency    Family history- Reviewed and unchanged Social history- Reviewed and unchanged   Review of Systems:  Review of Systems  Constitutional: Negative for malaise/fatigue and weight loss.  HENT: Negative for hearing loss and tinnitus.   Eyes: Negative for blurred vision and double vision.  Respiratory: Negative for cough, shortness of breath and wheezing.   Cardiovascular: Negative for chest pain, palpitations, orthopnea, claudication and leg swelling.  Gastrointestinal: Negative for abdominal pain, blood in  stool, constipation, diarrhea, heartburn, melena, nausea and vomiting.  Genitourinary: Negative.   Musculoskeletal: Negative for joint pain and myalgias.  Skin: Negative for rash.  Neurological: Negative for dizziness, tingling, sensory change, weakness and headaches.  Endo/Heme/Allergies: Negative for polydipsia.  Psychiatric/Behavioral: Negative for depression, memory loss, substance abuse and suicidal ideas. The patient is nervous/anxious ([place]) and has insomnia (Difficulty falling sleep without xanax).   All other systems reviewed and are negative.   Physical Exam: BP 118/82   Pulse 77   Wt 198 lb (89.8 kg)   BMI 30.11 kg/m  Wt Readings from Last 3 Encounters:  05/10/18 198 lb (89.8 kg)  01/22/18 196 lb 9.6 oz (89.2 kg)  10/15/17 201 lb (91.2 kg)   General : Well sounding patient in no apparent distress HEENT: no hoarseness, no cough for duration of visit Lungs: speaks in complete sentences, no audible wheezing, no apparent distress Neurological: alert, oriented x 3 Psychiatric: pleasant, judgement appropriate    Taylor Ribas, NP 10:55 AM [facility]

## 2018-05-10 ENCOUNTER — Ambulatory Visit: Payer: Medicare Other | Admitting: Adult Health

## 2018-05-10 ENCOUNTER — Other Ambulatory Visit: Payer: Self-pay

## 2018-05-10 ENCOUNTER — Ambulatory Visit: Payer: Self-pay | Admitting: Adult Health

## 2018-05-10 ENCOUNTER — Encounter: Payer: Self-pay | Admitting: Adult Health

## 2018-05-10 VITALS — BP 118/82 | HR 77 | Wt 198.0 lb

## 2018-05-10 DIAGNOSIS — I10 Essential (primary) hypertension: Secondary | ICD-10-CM | POA: Diagnosis not present

## 2018-05-10 DIAGNOSIS — R7309 Other abnormal glucose: Secondary | ICD-10-CM | POA: Diagnosis not present

## 2018-05-10 DIAGNOSIS — E559 Vitamin D deficiency, unspecified: Secondary | ICD-10-CM | POA: Diagnosis not present

## 2018-05-10 DIAGNOSIS — I480 Paroxysmal atrial fibrillation: Secondary | ICD-10-CM | POA: Diagnosis not present

## 2018-05-10 DIAGNOSIS — E663 Overweight: Secondary | ICD-10-CM | POA: Diagnosis not present

## 2018-05-10 DIAGNOSIS — Z79899 Other long term (current) drug therapy: Secondary | ICD-10-CM

## 2018-05-10 DIAGNOSIS — E782 Mixed hyperlipidemia: Secondary | ICD-10-CM

## 2018-05-10 DIAGNOSIS — F419 Anxiety disorder, unspecified: Secondary | ICD-10-CM

## 2018-05-10 DIAGNOSIS — F329 Major depressive disorder, single episode, unspecified: Secondary | ICD-10-CM

## 2018-05-10 DIAGNOSIS — F32A Depression, unspecified: Secondary | ICD-10-CM

## 2018-07-05 ENCOUNTER — Encounter: Payer: Self-pay | Admitting: Internal Medicine

## 2018-07-16 ENCOUNTER — Other Ambulatory Visit: Payer: Self-pay | Admitting: Internal Medicine

## 2018-08-05 ENCOUNTER — Other Ambulatory Visit: Payer: Self-pay | Admitting: Physician Assistant

## 2018-08-05 DIAGNOSIS — F411 Generalized anxiety disorder: Secondary | ICD-10-CM

## 2018-08-11 ENCOUNTER — Encounter: Payer: Self-pay | Admitting: Internal Medicine

## 2018-08-11 NOTE — Progress Notes (Signed)
   R  E  S  C  H  E  D  U  L  E  D D  U  E      T  O P  O  S  S  I  B  L  E   C  O  V  I  D-19       E  X  P  O  S  U  R  E                                                                                                                                                                                                                                                                                          This very nice 68 y.o. MWF  presents for a comprehensive evaluation and management of multiple medical co-morbidities.  Patient has been followed for HTN, HLD, Prediabetes  and Vitamin D Deficiency.      HTN predates since     . Patient's BP has been controlled at home. P_atient has a remote hx/o pAfib in 2002 & 2004. And had a CV. In 2011, she had a Negative Cardiolite.  She denies any cardiac symptoms as chest pain, palpitations, shortness of breath, dizziness or ankle swelling. Today's        Patient's hyperlipidemia is controlled with diet and medications. Patient denies myalgias or other medication SE's. Last lipids were at goal: Lab Results  Component Value Date   CHOL 193 01/22/2018   HDL 77 01/22/2018   LDLCALC 97 01/22/2018   TRIG 96 01/22/2018   CHOLHDL 2.5 01/22/2018      Patient has hx/o prediabetes predating since      and patient denies reactive hypoglycemic symptoms, visual blurring, diabetic polys or paresthesias. Last A1c was Normal & at goal: Lab Results  Component Value Date   HGBA1C 5.6 01/22/2018      Finally, patient has history of Vitamin D Deficiency and last Vitamin D was still low (goal 70-100): Lab Results  Component Value Date   VD25OH 51 01/22/2018

## 2018-08-12 ENCOUNTER — Ambulatory Visit: Payer: Medicare Other | Admitting: Internal Medicine

## 2018-08-12 ENCOUNTER — Other Ambulatory Visit: Payer: Self-pay

## 2018-08-12 ENCOUNTER — Other Ambulatory Visit: Payer: Self-pay | Admitting: Internal Medicine

## 2018-08-12 DIAGNOSIS — Z20822 Contact with and (suspected) exposure to covid-19: Secondary | ICD-10-CM

## 2018-08-12 DIAGNOSIS — R6889 Other general symptoms and signs: Secondary | ICD-10-CM | POA: Diagnosis not present

## 2018-08-14 LAB — NOVEL CORONAVIRUS, NAA: SARS-CoV-2, NAA: NOT DETECTED

## 2018-08-19 ENCOUNTER — Telehealth: Payer: Self-pay

## 2018-08-19 NOTE — Telephone Encounter (Signed)
Patient is requesting a lung screening

## 2018-08-23 ENCOUNTER — Ambulatory Visit: Payer: Medicare Other | Admitting: Adult Health

## 2018-08-23 ENCOUNTER — Other Ambulatory Visit: Payer: Self-pay

## 2018-08-23 ENCOUNTER — Encounter: Payer: Self-pay | Admitting: Adult Health

## 2018-08-23 VITALS — BP 107/63 | HR 43 | Temp 98.1°F

## 2018-08-23 DIAGNOSIS — R001 Bradycardia, unspecified: Secondary | ICD-10-CM | POA: Diagnosis not present

## 2018-08-23 DIAGNOSIS — I712 Thoracic aortic aneurysm, without rupture, unspecified: Secondary | ICD-10-CM

## 2018-08-23 DIAGNOSIS — I10 Essential (primary) hypertension: Secondary | ICD-10-CM | POA: Diagnosis not present

## 2018-08-23 NOTE — Telephone Encounter (Signed)
Patient will discuss at Phone Visit today.

## 2018-08-23 NOTE — Progress Notes (Signed)
Virtual Visit via Telephone Note  I connected with Taylor Li on 08/23/18 at  3:30 PM EDT by telephone and verified that I am speaking with the correct person using two identifiers.  Location: Patient: home Provider: [place] office   I discussed the limitations, risks, security and privacy concerns of performing an evaluation and management service by telephone and the availability of in person appointments. I also discussed with the patient that there may be a patient responsible charge related to this service. The patient expressed understanding and agreed to proceed.   History of Present Illness:  BP 107/63   Pulse (!) 43   Temp 98.1 F (36.7 C)   68 y.o. with hx of htn, p. A fib, PVCs, thoracic aortic aneurysm calls to request follow up CTA for thoracid aortic aneurysm (incidentally noted on screening chest CT for lung cancer screening/former smoker, 4.3 cm, saw cardiothoracic surgery who recommended annual monitoring).   She is also concerned about BPs and low pulse; 1-2 weeks ago was having fatigue, noted low BPs low 100s/60s, with low pulse - low as 41; she has a sense of pulse not being regular, does have hx of PVCs, saw Dr. Gwenlyn Found and had holter last year as well as ECHO on 09/09/2017 which showed Normal LV systolic function, grade 1 diastolic dysfunction, mild aortic dilatation. She was recommended follow up in 12 months but has opted to pursue monitoring through our office.   She has cut down on BP meds; has tapered and eventually held telmisartan 40 mg, hctz 12.5 mg; she has continued to take cardizem 120 mg 24 hour and toprol XL 25 mg daily. She does report fatigue has improved some since holding telmisartan and hctz. Denies CP, dyspnea, palpitations, dizziness, LE edema, PND.   Last EKG on 10/15/2017 showed normal rate, sinus rhythm with PCVs, left anterior fascicular block.    Current Outpatient Medications on File Prior to Visit  Medication Sig Dispense Refill  . ALPRAZolam  (XANAX) 1 MG tablet TAKE 1/2-1 TABLET, 2 - 3 X A DAY ONLY IF NEEDED FOR ANXIETY ATTACK & LIMIT TO 5 DAYS /WEEK TO AVOID ADDICTION 90 tablet 0  . Ascorbic Acid (VITAMIN C) 1000 MG tablet Take 1,000 mg by mouth daily.    Marland Kitchen aspirin 81 MG tablet Take 81 mg by mouth daily.    Marland Kitchen BLACK PEPPER-TURMERIC PO Take by mouth.    Marland Kitchen buPROPion (WELLBUTRIN XL) 300 MG 24 hr tablet Take 1 tablet Daily for Mood 90 tablet 3  . Cholecalciferol (VITAMIN D PO) Take 5,000 Units by mouth daily.    . citalopram (CELEXA) 40 MG tablet Take 1 tablet Daily for Mood (Patient taking differently: Take 1/2 tablet Daily for Mood) 90 tablet 3  . Coenzyme Q10 (COQ-10) 100 MG CAPS Take 2 capsules by mouth daily.    . cyclobenzaprine (FLEXERIL) 10 MG tablet TAKE 1/2 TO 1 TABLET BY MOUTH 3 TIMES A DAY AS NEEDED AS NEEDED FOR MUSCLE SPASM 90 tablet 3  . diltiazem (CARDIZEM CD) 120 MG 24 hr capsule Take 1 capsule (120 mg total) by mouth daily. 90 capsule 3  . Fexofenadine HCl (ALLEGRA PO) Take by mouth daily.    . Glucosamine-Chondroitin 500-400 MG CAPS Take 1 capsule by mouth daily.    . Magnesium 500 MG TABS Take by mouth. Takes 2 tablets daily    . metoprolol succinate (TOPROL XL) 25 MG 24 hr tablet Take 1 tablet (25 mg total) by mouth daily. 90 tablet 3  .  Multiple Vitamin (MULTIVITAMIN) tablet Take 1 tablet by mouth daily.    . Omega-3 Fatty Acids (FISH OIL) 1000 MG CAPS Take by mouth daily.    Marland Kitchen OVER THE COUNTER MEDICATION Takes Metamucil as needed.    . rosuvastatin (CRESTOR) 5 MG tablet Take 1 tablet daily for Cholesterol (Patient taking differently: Take 1 tablet three times a week) 90 tablet 3  . hydrochlorothiazide (HYDRODIURIL) 12.5 MG tablet Take 1 tablet Daily for BP & Fluid Retention (Patient not taking: Reported on 08/23/2018) 90 tablet 3  . telmisartan (MICARDIS) 40 MG tablet TAKE 1/2-1 TABLET BY MOUTH EVERY NIGHT. (Patient not taking: Reported on 08/23/2018) 90 tablet 1   No current facility-administered medications on  file prior to visit.      Allergies:  Allergies  Allergen Reactions  . Rosuvastatin     myalgias   Medical History:  has Essential hypertension; Hyperlipidemia; Vitamin D deficiency; Other abnormal glucose; Paroxysmal Afib; Medication management; Personal history of colonic polyp- adenoma; Anxiety and depression; COPD (chronic obstructive pulmonary disease) ([place]); Overweight (BMI 25.0-29.9); PVC's (premature ventricular contractions); Atypical chest pain; and Thoracic aortic aneurysm ([place]) on their problem list. Surgical History:  She  has a past surgical history that includes Laminectomy (1982); Breast surgery (Bilateral, 1983); Vaginal hysterectomy (1985); Tonsillectomy; Nasal septum surgery; Cardioversion; Colonoscopy; Appendectomy; Cholecystectomy; and Cardiac defibrillator placement. Family History:  Herfamily history includes Barrett's esophagus in her mother; Hypertension in her father and mother; Lung cancer in her father; Thyroid disease in her father. Social History:   reports that she quit smoking about 6 years ago. She has quit using smokeless tobacco. She reports current alcohol use of about 3.0 standard drinks of alcohol per week. She reports that she does not use drugs.    Observations/Objective:  General : Well sounding patient in no apparent distress HEENT: no hoarseness, no cough for duration of visit Lungs: speaks in complete sentences, no audible wheezing, no apparent distress Neurological: alert, oriented x 3 Psychiatric: pleasant, judgement appropriate   Assessment and Plan:  Jamya was seen today for hypotension.  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 Progression May need to follow up with  cardiology;  Go to the ER if any chest pain, shortness of breath, nausea, dizziness.  Thoracic aortic aneurysm without rupture ([facility]) -     CT Angio Chest W/Cm &/Or Wo Cm; Future    Follow Up Instructions:  I discussed the assessment and treatment plan with the patient. The patient was provided an opportunity to ask questions and all were answered. The patient agreed with the plan and demonstrated an understanding of the instructions.   The patient was advised to call back or seek an in-person evaluation if the symptoms worsen or if the condition fails to improve as anticipated.  I provided 20 minutes of non-face-to-face time during this encounter.   Izora Ribas, NP  Future Appointments  Date Time Provider [place]  08/26/2018  2:00 PM Liane Comber, NP [facility] None  10/22/2018  9:00 AM Unk Pinto, MD [facility] None  08/24/2019  2:00 PM Unk Pinto, MD [facility] None

## 2018-08-25 NOTE — Progress Notes (Signed)
Assessment and Plan:  Taylor Li was seen today for follow-up.  Diagnoses and all orders for this visit:  Bradycardia/first degree AV block Bradycardia resolved to baseline with holding toprol XL 25 mg  BPs no longer hypotensive EKG shows 1st degree AV block consistent with last year Has seen cardiology Dr. Gwenlyn Found, low risk pharm myoview last year, ECHO on showed Normal LV systolic function, grade 1 diastolic dysfunction, mild aortic dilatation Has been holding telmisartan; restart taking 1/2 tab daily for BP above 130/80 Continue to hold HCTZ for now Advised to follow up if any worsening palpitations/skipped beats, fatigue, edema any CP/dyspnea to present to ED or call Dr. Gwenlyn Found for recommendation and follow up -     EKG 12-Lead -     COMPLETE METABOLIC PANEL WITH GFR -     Magnesium -     TSH  Further disposition pending results of labs. Discussed med's effects and SE's.   Over 30 minutes of exam, counseling, chart review, and critical decision making was performed.   Future Appointments  Date Time Provider [place]  09/03/2018 12:20 PM [facility] 1 [facility]  10/22/2018  9:00 AM Unk Pinto, MD [facility] None  08/24/2019  2:00 PM Unk Pinto, MD [facility] None    ------------------------------------------------------------------------------------------------------------------   HPI BP (!) 138/92   Pulse 64   Temp (!) 97.2 F (36.2 C)   Resp 16   Ht 5\' 8"  (1.727 m)   Wt 195 lb 3.2 oz (88.5 kg)   BMI 29.68 kg/m   68 y.o.female with hx of paroxysmal a. Fib, PACs, htn presents for 2 day follow up on new onset bradycardia and hypotension. She has known 1st degree AV block per last EKG, is followed by Dr. Gwenlyn Found.   She was evaluated via telehealth visit on 08/23/2018;   Per note from 08/23/2018: She is also concerned about BPs and low pulse; 1-2 weeks ago was having fatigue, noted low BPs low 100s/60s, with low pulse - low as 41; she has a sense of  pulse not being regular, does have hx of PVCs, saw Dr. Gwenlyn Found and had holter last year as well as ECHO on 09/09/2017 which showed Normal LV systolic function, grade 1 diastolic dysfunction, mild aortic dilatation. She was recommended follow up in 12 months but has opted to pursue monitoring through our office.   She has cut down on BP meds; has tapered and eventually held telmisartan 40 mg, hctz 12.5 mg; she has continued to take cardizem 120 mg 24 hour and toprol XL 25 mg daily. She does report fatigue has improved some since holding telmisartan and hctz. Denies CP, dyspnea, palpitations, dizziness, LE edema, PND.   -----------------------------  She had pharm myoview last year which showed no evidence of ischemia or infarction, appears toprol XL 25 mg daily was added at that time for sensation of skipped beats to improve frequent PVCs. At last visit, she was advised to hold toprol XL 25 mg daily and presents for 3 day follow up.   EKG today shows normal rate, PVCs, 1st degree AV block consistent with last, PR interval of 244 similar to last on 10/15/2017.   She reports BPs and pulses, fatigue has improved significantly since stoping toprol. She reports no increase in sense of skipped beats, no chest pains, dyspnea, edema. She wonders if increased PVCs last year may have been related to increased alcohol at that time, reports has stopped drinking since Jan/2020.   Has been holding telmisartan 40  and HCTZ 12.5  Her blood pressure has been controlled at home, today their BP is BP: (!) 138/92 She denies chest pain, shortness of breath, dizziness, edema.    Past Medical History:  Diagnosis Date  . A-fib ([place])   . Arthritis   . C. difficile diarrhea   . Hyperlipidemia   . Hypertension   . Personal history of colonic polyp- adenoma 09/22/2013  . Pre-diabetes   . Vitamin D deficiency      Allergies  Allergen Reactions  . Rosuvastatin     myalgias    Current Outpatient Medications on File  Prior to Visit  Medication Sig  . ALPRAZolam (XANAX) 1 MG tablet TAKE 1/2-1 TABLET, 2 - 3 X A DAY ONLY IF NEEDED FOR ANXIETY ATTACK & LIMIT TO 5 DAYS /WEEK TO AVOID ADDICTION  . Ascorbic Acid (VITAMIN C) 1000 MG tablet Take 1,000 mg by mouth daily.  Marland Kitchen aspirin 81 MG tablet Take 81 mg by mouth daily.  Marland Kitchen BLACK PEPPER-TURMERIC PO Take by mouth.  Marland Kitchen buPROPion (WELLBUTRIN XL) 300 MG 24 hr tablet Take 1 tablet Daily for Mood  . Cholecalciferol (VITAMIN D PO) Take 5,000 Units by mouth daily.  . citalopram (CELEXA) 40 MG tablet Take 1 tablet Daily for Mood (Patient taking differently: Take 1/2 tablet Daily for Mood)  . Coenzyme Q10 (COQ-10) 100 MG CAPS Take 2 capsules by mouth daily.  . cyclobenzaprine (FLEXERIL) 10 MG tablet TAKE 1/2 TO 1 TABLET BY MOUTH 3 TIMES A DAY AS NEEDED AS NEEDED FOR MUSCLE SPASM  . Fexofenadine HCl (ALLEGRA PO) Take by mouth daily.  . Glucosamine-Chondroitin 500-400 MG CAPS Take 1 capsule by mouth daily.  . hydrochlorothiazide (HYDRODIURIL) 12.5 MG tablet Take 1 tablet Daily for BP & Fluid Retention  . Magnesium 500 MG TABS Take by mouth. Takes 2 tablets daily  . metoprolol succinate (TOPROL XL) 25 MG 24 hr tablet Take 1 tablet (25 mg total) by mouth daily.  . Multiple Vitamin (MULTIVITAMIN) tablet Take 1 tablet by mouth daily.  . Omega-3 Fatty Acids (FISH OIL) 1000 MG CAPS Take by mouth daily.  Marland Kitchen OVER THE COUNTER MEDICATION Takes Metamucil as needed.  . rosuvastatin (CRESTOR) 5 MG tablet Take 1 tablet daily for Cholesterol (Patient taking differently: Take 1 tablet three times a week)  . telmisartan (MICARDIS) 40 MG tablet TAKE 1/2-1 TABLET BY MOUTH EVERY NIGHT.  Marland Kitchen diltiazem (CARDIZEM CD) 120 MG 24 hr capsule Take 1 capsule (120 mg total) by mouth daily.   No current facility-administered medications on file prior to visit.     ROS: all negative except above.   Physical Exam:  BP (!) 138/92   Pulse 64   Temp (!) 97.2 F (36.2 C)   Resp 16   Ht 5\' 8"  (1.727 m)    Wt 195 lb 3.2 oz (88.5 kg)   BMI 29.68 kg/m   General Appearance: Well nourished, in no apparent distress. Eyes: PERRLA, conjunctiva no swelling or erythema ENT/Mouth: No erythema, swelling, or exudate on post pharynx.  Tonsils not swollen or erythematous. Hearing normal.  Neck: Supple, thyroid normal.  Respiratory: Respiratory effort normal, BS equal bilaterally without rales, rhonchi, wheezing or stridor.  Cardio: Frequent skipped beats with no MRGs. Brisk peripheral pulses without edema.  Abdomen: Soft, + BS.  Non tender, no guarding, rebound, hernias, masses. Lymphatics: Non tender without lymphadenopathy.  Musculoskeletal: normal gait.   Skin: Warm, dry without rashes, lesions, ecchymosis.  Neuro: Cranial nerves intact. Normal muscle tone, no  cerebellar symptoms Psych: Awake and oriented X 3, normal affect, Insight and Judgment appropriate.    Izora Ribas, NP 2:03 PM [facility]

## 2018-08-26 ENCOUNTER — Encounter: Payer: Self-pay | Admitting: Adult Health

## 2018-08-26 ENCOUNTER — Other Ambulatory Visit: Payer: Self-pay

## 2018-08-26 ENCOUNTER — Other Ambulatory Visit: Payer: Self-pay | Admitting: Adult Health

## 2018-08-26 ENCOUNTER — Ambulatory Visit (INDEPENDENT_AMBULATORY_CARE_PROVIDER_SITE_OTHER): Payer: Medicare Other | Admitting: Adult Health

## 2018-08-26 VITALS — BP 138/92 | HR 64 | Temp 97.2°F | Resp 16 | Ht 68.0 in | Wt 195.2 lb

## 2018-08-26 DIAGNOSIS — I10 Essential (primary) hypertension: Secondary | ICD-10-CM

## 2018-08-26 DIAGNOSIS — I712 Thoracic aortic aneurysm, without rupture, unspecified: Secondary | ICD-10-CM

## 2018-08-26 DIAGNOSIS — R001 Bradycardia, unspecified: Secondary | ICD-10-CM

## 2018-08-26 DIAGNOSIS — I480 Paroxysmal atrial fibrillation: Secondary | ICD-10-CM

## 2018-08-26 DIAGNOSIS — Z79899 Other long term (current) drug therapy: Secondary | ICD-10-CM | POA: Diagnosis not present

## 2018-08-26 DIAGNOSIS — I440 Atrioventricular block, first degree: Secondary | ICD-10-CM

## 2018-08-26 NOTE — Patient Instructions (Addendum)
Restart telmisartan 1/2 tab daily if blood pressure is above 130/80   - montior BPs - if persistently above 130/80 after 1 week, can increase to full tab   Will continue to hold hydrochlorothiazide for now unless indicated by persistently elevated BPs or edema  Please do let me know if having any chest pain episodes, slow heart rate or worsening sensation of skipped beats  For any persistent chest pain with shortness of breath please go to the ER  First-Degree Atrioventricular Block  What is atrioventricular block? Atrioventricular (AV) block, also called heart block, is a problem with the system that controls how often the heart beats (heart rate) and the pattern of heart beats (heart rhythm). In this condition, the signals that travel from the heart's upper chambers (atria) to its lower chambers (ventricles) move too slowly or are interrupted. There are several types of heart block:  First-degree.  Second-degree.  Third-degree or complete. What is first-degree heart block? First-degree AV block is the least serious type of heart block. In this condition, the signals that control heart rate move too slowly. As a result, the heart may beat more slowly than normal. First-degree AV block can increase your risk of developing a type of irregular heartbeat called atrial fibrillation. What are the causes? This condition may be caused by:  Any condition that damages the system that controls the heart's rate and rhythm, such as a heart attack.  Overstimulation of the nerve that slows down heart rate (vagus nerve). This cause is common among well-conditioned athletes.  Some medicines that slow down heart rate, such as beta blockers or calcium channel blockers.  Surgery that damages the heart. Some people are born with this condition (congenital heart block), but most people develop it over time. What increases the risk? The risk for this condition increases with age. You are also more  likely to develop this condition if you have:  A history of heart attack.  Heart failure.  Coronary heart disease.  Inflammation of heart muscle (myocarditis).  Disease of heart muscle (cardiomyopathy).  Infection of the heart valves (endocarditis).  Infections or diseases that affect the heart. These include: ? Lyme disease. ? Sarcoidosis. ? Hemochromatosis. ? Rheumatic fever. ? Muscle disorders including Lev disease and Lenegre disease. Babies are more likely to be born with heart block if:  The mother has an autoimmune disease, such as lupus.  The baby is born with a heart defect that affects the heart's structure.  A parent was born with a heart defect. What are the signs or symptoms? This condition usually does not cause any symptoms. How is this diagnosed? This condition may be diagnosed based on:  A physical exam.  Your medical history.  A measurement of your pulse or heartbeat.  Tests. These may include: ? An electrocardiogram (ECG). This test is done to check for problems with electrical activity in the heart. ? A Holter monitor or event monitor test. This test involves wearing a portable device that monitors your heart rate over time. ? An electrophysiology (EP) study. This test involves having long, thin tubes (catheters) placed in the heart. This test records electrical signals in the heart. How is this treated? Usually, treatment is not needed for this condition. In some cases, treatment involves:  Treating an underlying condition, such as heart disease.  Changing or stopping any heart medicines that can cause heart block. Follow these instructions at home:   Take over-the-counter and prescription medicines only as told by  your health care provider.  Work with your health care provider to control lifestyle choices that increase your risk for heart disease. You may need to: ? Get regular exercise. Each week, try to get 150 minutes of  moderate-intensity activity (such as walking or yoga) or 75 minutes of vigorous activity (such as running or swimming). Ask your health care provider what type of exercise is safe for you. ? Eat a heart-healthy diet with fruits and vegetables, whole grains, low-fat dairy products, and lean proteins like poultry and eggs. Your health care provider or diet and nutrition specialist (dietitian) can help you make healthy choices. ? Maintain a healthy weight. ? Limit alcohol intake to no more than 1 drink per day for nonpregnant women and 2 drinks per day for men. One drink equals 12 oz of beer, 5 oz of wine, or 1 oz of hard liquor.  Do not use any products that contain nicotine or tobacco, such as cigarettes and e-cigarettes. If you need help quitting, ask your health care provider.  Keep all follow-up visits as told by your health care provider. This is important. Contact a health care provider if:  You feel like your heart is skipping beats.  You feel more tired than normal.  You have swelling in your hands, feet, or lower legs. Get help right away if:  Your symptoms change or they get worse.  You develop new symptoms.  You have chest pain, especially if the pain: ? Feels like crushing or pressure. ? Spreads to your arms, back, neck, or jaw.  You feel short of breath.  You feel light-headed or weak.  You faint. Summary  First-degree AV block is the least serious type of heart block. In this condition, the signals that control heart rate move too slowly. As a result, the heart may beat more slowly than normal.  Usually, treatment is not needed for this condition. In some cases, you may need to change or stop medications that may be making the condition worse.  Healthy lifestyle choices such as exercising regularly, eating a healthy diet, and limiting alcohol are good for your heart. This information is not intended to replace advice given to you by your health care provider. Make  sure you discuss any questions you have with your health care provider. Document Released: 12/13/2007 Document Revised: 01/30/2017 Document Reviewed: 08/17/2015 Elsevier Patient Education  2020 [employer].

## 2018-08-27 LAB — TSH: TSH: 0.83 mIU/L (ref 0.40–4.50)

## 2018-08-27 LAB — COMPLETE METABOLIC PANEL WITHOUT GFR
AG Ratio: 1.7 (calc) (ref 1.0–2.5)
ALT: 16 U/L (ref 6–29)
AST: 15 U/L (ref 10–35)
Albumin: 4.3 g/dL (ref 3.6–5.1)
Alkaline phosphatase (APISO): 65 U/L (ref 37–153)
BUN: 11 mg/dL (ref 7–25)
CO2: 26 mmol/L (ref 20–32)
Calcium: 9.7 mg/dL (ref 8.6–10.4)
Chloride: 105 mmol/L (ref 98–110)
Creat: 0.75 mg/dL (ref 0.50–0.99)
GFR, Est African American: 96 mL/min/{1.73_m2}
GFR, Est Non African American: 83 mL/min/{1.73_m2}
Globulin: 2.6 g/dL (ref 1.9–3.7)
Glucose, Bld: 101 mg/dL — ABNORMAL HIGH (ref 65–99)
Potassium: 4.1 mmol/L (ref 3.5–5.3)
Sodium: 138 mmol/L (ref 135–146)
Total Bilirubin: 0.6 mg/dL (ref 0.2–1.2)
Total Protein: 6.9 g/dL (ref 6.1–8.1)

## 2018-08-27 LAB — MAGNESIUM: Magnesium: 2.1 mg/dL (ref 1.5–2.5)

## 2018-09-03 ENCOUNTER — Ambulatory Visit
Admission: RE | Admit: 2018-09-03 | Discharge: 2018-09-03 | Disposition: A | Discharge status: Home / Self Care | Payer: Medicare Other | Source: Ambulatory Visit | Attending: Adult Health | Admitting: Adult Health

## 2018-09-03 DIAGNOSIS — I712 Thoracic aortic aneurysm, without rupture, unspecified: Secondary | ICD-10-CM

## 2018-09-03 MED ORDER — IOPAMIDOL (ISOVUE-370) INJECTION 76%
75.0000 mL | Freq: Once | INTRAVENOUS | Status: AC | PRN
  Administered 2018-09-03: 75 mL via INTRAVENOUS

## 2018-09-12 ENCOUNTER — Encounter: Payer: Self-pay | Admitting: Internal Medicine

## 2018-09-15 DIAGNOSIS — D485 Neoplasm of uncertain behavior of skin: Secondary | ICD-10-CM | POA: Diagnosis not present

## 2018-09-15 DIAGNOSIS — D1801 Hemangioma of skin and subcutaneous tissue: Secondary | ICD-10-CM | POA: Diagnosis not present

## 2018-09-15 DIAGNOSIS — D225 Melanocytic nevi of trunk: Secondary | ICD-10-CM | POA: Diagnosis not present

## 2018-09-15 DIAGNOSIS — L570 Actinic keratosis: Secondary | ICD-10-CM | POA: Diagnosis not present

## 2018-09-15 DIAGNOSIS — L821 Other seborrheic keratosis: Secondary | ICD-10-CM | POA: Diagnosis not present

## 2018-09-15 DIAGNOSIS — Z85828 Personal history of other malignant neoplasm of skin: Secondary | ICD-10-CM | POA: Diagnosis not present

## 2018-09-15 DIAGNOSIS — L820 Inflamed seborrheic keratosis: Secondary | ICD-10-CM | POA: Diagnosis not present

## 2018-09-21 ENCOUNTER — Ambulatory Visit: Payer: Medicare Other | Admitting: Thoracic Surgery (Cardiothoracic Vascular Surgery)

## 2018-09-22 ENCOUNTER — Other Ambulatory Visit: Payer: Self-pay | Admitting: Cardiovascular Disease

## 2018-09-29 DIAGNOSIS — L570 Actinic keratosis: Secondary | ICD-10-CM | POA: Diagnosis not present

## 2018-09-29 DIAGNOSIS — Z85828 Personal history of other malignant neoplasm of skin: Secondary | ICD-10-CM | POA: Diagnosis not present

## 2018-09-29 DIAGNOSIS — L819 Disorder of pigmentation, unspecified: Secondary | ICD-10-CM | POA: Diagnosis not present

## 2018-10-11 ENCOUNTER — Other Ambulatory Visit: Payer: Self-pay | Admitting: Adult Health

## 2018-10-11 DIAGNOSIS — I10 Essential (primary) hypertension: Secondary | ICD-10-CM

## 2018-10-15 DIAGNOSIS — Z23 Encounter for immunization: Secondary | ICD-10-CM | POA: Diagnosis not present

## 2018-10-19 ENCOUNTER — Other Ambulatory Visit: Payer: Self-pay | Admitting: Adult Health

## 2018-10-19 DIAGNOSIS — F411 Generalized anxiety disorder: Secondary | ICD-10-CM

## 2018-10-22 ENCOUNTER — Encounter: Payer: Medicare Other | Admitting: Internal Medicine

## 2018-10-25 ENCOUNTER — Ambulatory Visit: Payer: Self-pay | Admitting: Adult Health

## 2018-11-03 DIAGNOSIS — L819 Disorder of pigmentation, unspecified: Secondary | ICD-10-CM | POA: Diagnosis not present

## 2018-11-03 DIAGNOSIS — Z85828 Personal history of other malignant neoplasm of skin: Secondary | ICD-10-CM | POA: Diagnosis not present

## 2018-11-03 DIAGNOSIS — L570 Actinic keratosis: Secondary | ICD-10-CM | POA: Diagnosis not present

## 2018-11-12 ENCOUNTER — Ambulatory Visit: Payer: Self-pay | Admitting: Adult Health

## 2018-12-01 ENCOUNTER — Encounter: Payer: Medicare Other | Admitting: Internal Medicine

## 2019-01-08 ENCOUNTER — Other Ambulatory Visit: Payer: Self-pay | Admitting: Internal Medicine

## 2019-01-08 DIAGNOSIS — F411 Generalized anxiety disorder: Secondary | ICD-10-CM

## 2019-01-31 ENCOUNTER — Encounter: Payer: Self-pay | Admitting: Internal Medicine

## 2019-01-31 NOTE — Progress Notes (Signed)
Comprehensive Evaluation &  Examination     This very nice 69 y.o. MWF  presents for a  comprehensive evaluation and management of multiple medical co-morbidities.  Patient has been followed for HTN, hx/o pAfib, HLD, Prediabetes  and Vitamin D Deficiency.      HTN predates since 2002.  In August , patient BP was low and she had mild associated HR and Toprol was d/c'd . As BP normalized, Telmisartan was restarted at 1/2 dose.  Since then patient's BP has been controlled at home. Patient has a very remote hx/o pAfib in 2002 and in 2004 required CV. In Sept 2019, she had a Normal Stress Myoview with Dr Gwenlyn Found. 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  Patient denies any cardiac symptoms as chest pain, palpitations, shortness of breath, dizziness or ankle swelling. Today's BP is at goal at 126/92.     Patient's hyperlipidemia is controlled with diet and medications. Patient denies myalgias or other medication SE's. Last lipids were at goal:  Lab Results  Component Value Date   CHOL 193 01/22/2018   HDL 77 01/22/2018   LDLCALC 97 01/22/2018   TRIG 96 01/22/2018   CHOLHDL 2.5 01/22/2018       Patient has hx/o prediabetes predating (A1c 5.8% / 2011 &  5.9% / 2011)  and patient denies reactive hypoglycemic symptoms, visual blurring, diabetic polys or paresthesias. Last A1c was Normal & at goal:  Lab Results  Component Value Date   HGBA1C 5.6 01/22/2018       Finally, patient has history of Vitamin D Deficiency ("36 " / 2010)  and last Vitamin D was sl low  (goal 70-100):  Lab Results  Component Value Date   VD25OH 51 01/22/2018   Current Outpatient Medications on File Prior to Visit  Medication Sig  . ALPRAZolam (XANAX) 1 MG tablet Take 1/2-1 tablet 2 - 3 x /day ONLY if needed for Anxiety Attack &  limit to 5 days /week to avoid addiction  . Ascorbic Acid (VITAMIN C) 1000 MG tablet Take 1,000 mg by mouth daily.  Marland Kitchen aspirin 81 MG tablet Take 81 mg  by mouth daily.  Marland Kitchen BLACK PEPPER-TURMERIC PO Take 1,000 mg by mouth daily.   Marland Kitchen buPROPion (WELLBUTRIN XL) 300 MG 24 hr tablet Take 1 tablet Daily for Mood  . Cholecalciferol (VITAMIN D PO) Take 5,000 Units by mouth 2 (two) times daily.   . citalopram (CELEXA) 40 MG tablet Take 1 tablet Daily for Mood (Patient taking differently: Take 1/2 tablet Daily for Mood)  . Coenzyme Q10 (COQ-10) 100 MG CAPS Take 2 capsules by mouth daily.  . cyclobenzaprine (FLEXERIL) 10 MG tablet TAKE 1/2 TO 1 TABLET BY MOUTH 3 TIMES A DAY AS NEEDED AS NEEDED FOR MUSCLE SPASM  . diltiazem (CARDIZEM CD) 120 MG 24 hr capsule TAKE 1 CAPSULE BY MOUTH EVERY DAY  . Fexofenadine HCl (ALLEGRA PO) Take by mouth daily.  . Glucosamine-Chondroitin 500-400 MG CAPS Take 1 capsule by mouth daily.  . Magnesium 500 MG TABS Take by mouth. Takes 2 tablets daily  . Multiple Vitamin (MULTIVITAMIN) tablet Take 1 tablet by mouth daily.  . Omega-3 Fatty Acids (FISH OIL) 1000 MG CAPS Take by mouth daily.  Marland Kitchen OVER THE COUNTER MEDICATION Takes Metamucil as needed.  . Probiotic Product (PROBIOTIC DAILY PO) Take 1 tablet by mouth daily.  . rosuvastatin (CRESTOR) 5 MG tablet Take 1 tablet daily for Cholesterol (Patient taking  differently: Take 1 tablet three times a week)  . telmisartan (MICARDIS) 40 MG tablet Take 1 tablet Daily for BP   No current facility-administered medications on file prior to visit.   Allergies  Allergen Reactions  . Rosuvastatin     myalgias   Past Medical History:  Diagnosis Date  . A-fib ([place])   . Arthritis   . C. difficile diarrhea   . Hyperlipidemia   . Hypertension   . Personal history of colonic polyp- adenoma 09/22/2013  . Pre-diabetes   . Vitamin D deficiency    Health Maintenance  Topic Date Due  . MAMMOGRAM  04/16/2015  . PNA vac Low Risk Adult (2 of 2 - PPSV23) 06/16/2018  . COLONOSCOPY  09/17/2018  . TETANUS/TDAP  10/17/2025  . INFLUENZA VACCINE  Completed  . DEXA SCAN  Completed  . Hepatitis C  Screening  Completed   Immunization History  Administered Date(s) Administered  . DTaP 01/13/2001  . Influenza Inj Mdck Quad With Preservative 09/30/2016  . Influenza Whole 10/06/2012  . Influenza, High Dose Seasonal PF 10/15/2017  . Influenza, Quadrivalent, Recombinant, Inj, Pf 10/15/2018  . Influenza,inj,quad, With Preservative 10/18/2015  . Influenza-Unspecified 11/11/2013, 11/15/2014  . PPD Test 01/14/2013, 04/17/2014, 04/16/2015, 05/15/2016  . Pneumococcal Conjugate-13 06/15/2017  . Pneumococcal Polysaccharide-23 01/13/2001  . Tdap 10/18/2015  . Zoster 11/19/2009   Last Colon - 09/16/2013 - Dr Carlean Purl - Recc 5 yr f/u - Overdue Sept 2020  Last MGM - 04/15/2013  (Hx/o subcutaneous mastectomies with implants)  Past Surgical History:  Procedure Laterality Date  . APPENDECTOMY    . BREAST SURGERY Bilateral 1983   SQ mastectomies  . CARDIAC DEFIBRILLATOR PLACEMENT    . CARDIOVERSION    . CHOLECYSTECTOMY    . COLONOSCOPY    . LAMINECTOMY  1982   L4-L5  . NASAL SEPTUM SURGERY    . TONSILLECTOMY    . VAGINAL HYSTERECTOMY  1985   Family History  Problem Relation Age of Onset  . Hypertension Father   . Lung cancer Father   . Thyroid disease Father   . Hypertension Mother   . Barrett's esophagus Mother   . Mitral valve prolapse Mother   . Atrial fibrillation Mother   . Colon cancer Neg Hx    Social History   Tobacco Use  . Smoking status: Former Smoker    Quit date: 04/18/2012    Years since quitting: 6.7  . Smokeless tobacco: Former Systems developer  . Tobacco comment: e-cigarettes  Substance Use Topics  . Alcohol use: Yes    Alcohol/week: 3.0 standard drinks    Types: 3 Standard drinks or equivalent per week    Comment: social  . Drug use: No    ROS Constitutional: Denies fever, chills, weight loss/gain, headaches, insomnia,  night sweats, and change in appetite. Does c/o fatigue. Eyes: Denies redness, blurred vision, diplopia, discharge, itchy, watery eyes.  ENT: Denies  discharge, congestion, post nasal drip, epistaxis, sore throat, earache, hearing loss, dental pain, Tinnitus, Vertigo, Sinus pain, snoring.  Cardio: Denies chest pain, palpitations, irregular heartbeat, syncope, dyspnea, diaphoresis, orthopnea, PND, claudication, edema Respiratory: denies cough, dyspnea, DOE, pleurisy, hoarseness, laryngitis, wheezing.  Gastrointestinal: Denies dysphagia, heartburn, reflux, water brash, pain, cramps, nausea, vomiting, bloating, diarrhea, constipation, hematemesis, melena, hematochezia, jaundice, hemorrhoids Genitourinary: Denies dysuria, frequency, urgency, nocturia, hesitancy, discharge, hematuria, flank pain Breast: Breast lumps, nipple discharge, bleeding.  Musculoskeletal: Denies arthralgia, myalgia, stiffness, Jt. Swelling, pain, limp, and strain/sprain. Denies falls. Skin: Denies puritis, rash, hives, warts, acne,  eczema, changing in skin lesion Neuro: No weakness, tremor, incoordination, spasms, paresthesia, pain Psychiatric: Denies confusion, memory loss, sensory loss. Denies Depression. Endocrine: Denies change in weight, skin, hair change, nocturia, and paresthesia, diabetic polys, visual blurring, hyper / hypo glycemic episodes.  Heme/Lymph: No excessive bleeding, bruising, enlarged lymph nodes.  Physical Exam  BP (!) 126/92   Pulse 72   Temp (!) 97 F (36.1 C)   Resp 16   Ht 5' 9.25" (1.759 m)   Wt 202 lb (91.6 kg)   BMI 29.62 kg/m   General Appearance: Well nourished, well groomed and in no apparent distress.  Eyes: PERRLA, EOMs, conjunctiva no swelling or erythema, normal fundi and vessels. Sinuses: No frontal/maxillary tenderness ENT/Mouth: EACs patent / TMs  nl. Nares clear without erythema, swelling, mucoid exudates. Oral hygiene is good. No erythema, swelling, or exudate. Tongue normal, non-obstructing. Tonsils not swollen or erythematous. Hearing normal.  Neck: Supple, thyroid not palpable. No bruits, nodes or JVD. Respiratory:  Respiratory effort normal.  BS equal and clear bilateral without rales, rhonci, wheezing or stridor. Cardio: Heart sounds are normal with regular rate and rhythm and no murmurs, rubs or gallops. Peripheral pulses are normal and equal bilaterally without edema. No aortic or femoral bruits. Chest: symmetric with normal excursions and percussion. Breasts:  Palpable implants and symmetric, without lumps, nipple discharge, retractions or fibrocystic changes.  Abdomen: Flat, soft with bowel sounds active. Nontender, no guarding, rebound, hernias, masses, or organomegaly.  Lymphatics: Non tender without lymphadenopathy.  Genitourinary:  Musculoskeletal: Full ROM all peripheral extremities, joint stability, 5/5 strength, and normal gait. Skin: Warm and dry without rashes, lesions, cyanosis, clubbing or  ecchymosis.  Neuro: Cranial nerves intact, reflexes equal bilaterally. Normal muscle tone, no cerebellar symptoms. Sensation intact.  Pysch: Alert and oriented X 3, normal affect, Insight and Judgment appropriate.   Assessment and Plan  1. Essential hypertension  - EKG 12-Lead - Urinalysis, Routine w reflex microscopic - Microalbumin / creatinine urine ratio - CBC with Differential/Platelet - COMPLETE METABOLIC PANEL WITH GFR - Magnesium - TSH  2. Hyperlipidemia, mixed  - EKG 12-Lead - Lipid panel - TSH  3. Abnormal glucose  - EKG 12-Lead - Hemoglobin A1c - Insulin, random  4. Vitamin D deficiency  - VITAMIN D 25 Hydroxy  5. Prediabetes  - EKG 12-Lead - Hemoglobin A1c - Insulin, random  6. Screening for colorectal cancer  - POC Hemoccult Bld/Stl   7. Screening for ischemic heart disease  - EKG 12-Lead  8. Former smoker  - EKG 12-Lead  9. FHx: hypertension  - EKG 12-Lead  10. Medication management  - Urinalysis, Routine w reflex microscopic - Microalbumin / creatinine urine ratio - CBC with Differential/Platelet - COMPLETE METABOLIC PANEL WITH GFR -  Magnesium - Lipid panel - TSH - Hemoglobin A1c - Insulin, random - VITAMIN D 25 Hydroxy         Patient was counseled in prudent diet to achieve/maintain BMI less than 25 for weight control, BP monitoring, regular exercise and medications. Discussed med's effects and SE's. Screening labs and tests as requested with regular follow-up as recommended. Over 40 minutes of exam, counseling, chart review and high complex critical decision making was performed.   Kirtland Bouchard, MD

## 2019-01-31 NOTE — Patient Instructions (Signed)

## 2019-02-01 ENCOUNTER — Other Ambulatory Visit: Payer: Self-pay

## 2019-02-01 ENCOUNTER — Ambulatory Visit (INDEPENDENT_AMBULATORY_CARE_PROVIDER_SITE_OTHER): Payer: Medicare Other | Admitting: Internal Medicine

## 2019-02-01 VITALS — BP 126/92 | HR 72 | Temp 97.0°F | Resp 16 | Ht 69.25 in | Wt 202.0 lb

## 2019-02-01 DIAGNOSIS — Z87891 Personal history of nicotine dependence: Secondary | ICD-10-CM | POA: Diagnosis not present

## 2019-02-01 DIAGNOSIS — Z79899 Other long term (current) drug therapy: Secondary | ICD-10-CM | POA: Diagnosis not present

## 2019-02-01 DIAGNOSIS — E782 Mixed hyperlipidemia: Secondary | ICD-10-CM

## 2019-02-01 DIAGNOSIS — Z1212 Encounter for screening for malignant neoplasm of rectum: Secondary | ICD-10-CM

## 2019-02-01 DIAGNOSIS — R7303 Prediabetes: Secondary | ICD-10-CM

## 2019-02-01 DIAGNOSIS — Z1211 Encounter for screening for malignant neoplasm of colon: Secondary | ICD-10-CM

## 2019-02-01 DIAGNOSIS — Z136 Encounter for screening for cardiovascular disorders: Secondary | ICD-10-CM

## 2019-02-01 DIAGNOSIS — I10 Essential (primary) hypertension: Secondary | ICD-10-CM

## 2019-02-01 DIAGNOSIS — R7309 Other abnormal glucose: Secondary | ICD-10-CM

## 2019-02-01 DIAGNOSIS — Z8249 Family history of ischemic heart disease and other diseases of the circulatory system: Secondary | ICD-10-CM

## 2019-02-01 DIAGNOSIS — E559 Vitamin D deficiency, unspecified: Secondary | ICD-10-CM | POA: Diagnosis not present

## 2019-02-01 DIAGNOSIS — Z23 Encounter for immunization: Secondary | ICD-10-CM

## 2019-02-02 ENCOUNTER — Telehealth: Payer: Self-pay | Admitting: Internal Medicine

## 2019-02-02 ENCOUNTER — Other Ambulatory Visit: Payer: Self-pay | Admitting: Internal Medicine

## 2019-02-02 DIAGNOSIS — D126 Benign neoplasm of colon, unspecified: Secondary | ICD-10-CM

## 2019-02-02 LAB — CBC WITH DIFFERENTIAL/PLATELET
Absolute Monocytes: 481 cells/uL (ref 200–950)
Basophils Absolute: 20 cells/uL (ref 0–200)
Basophils Relative: 0.3 %
Eosinophils Absolute: 98 cells/uL (ref 15–500)
Eosinophils Relative: 1.5 %
HCT: 39.8 % (ref 35.0–45.0)
Hemoglobin: 13.4 g/dL (ref 11.7–15.5)
Lymphs Abs: 2217 cells/uL (ref 850–3900)
MCH: 29.5 pg (ref 27.0–33.0)
MCHC: 33.7 g/dL (ref 32.0–36.0)
MCV: 87.7 fL (ref 80.0–100.0)
MPV: 11.3 fL (ref 7.5–12.5)
Monocytes Relative: 7.4 %
Neutro Abs: 3686 cells/uL (ref 1500–7800)
Neutrophils Relative %: 56.7 %
Platelets: 245 10*3/uL (ref 140–400)
RBC: 4.54 10*6/uL (ref 3.80–5.10)
RDW: 12.4 % (ref 11.0–15.0)
Total Lymphocyte: 34.1 %
WBC: 6.5 10*3/uL (ref 3.8–10.8)

## 2019-02-02 LAB — URINALYSIS, ROUTINE W REFLEX MICROSCOPIC
Bacteria, UA: NONE SEEN /HPF
Bilirubin Urine: NEGATIVE
Glucose, UA: NEGATIVE
Hgb urine dipstick: NEGATIVE
Hyaline Cast: NONE SEEN /LPF
Ketones, ur: NEGATIVE
Nitrite: NEGATIVE
Protein, ur: NEGATIVE
RBC / HPF: NONE SEEN /HPF (ref 0–2)
Specific Gravity, Urine: 1.017 (ref 1.001–1.03)
Squamous Epithelial / HPF: NONE SEEN /HPF (ref <=5)
WBC, UA: NONE SEEN /HPF (ref 0–5)
pH: 7.5 (ref 5.0–8.0)

## 2019-02-02 LAB — COMPLETE METABOLIC PANEL WITH GFR
AG Ratio: 1.9 (calc) (ref 1.0–2.5)
ALT: 21 U/L (ref 6–29)
AST: 15 U/L (ref 10–35)
Albumin: 4.1 g/dL (ref 3.6–5.1)
Alkaline phosphatase (APISO): 62 U/L (ref 37–153)
BUN: 16 mg/dL (ref 7–25)
CO2: 29 mmol/L (ref 20–32)
Calcium: 9.4 mg/dL (ref 8.6–10.4)
Chloride: 104 mmol/L (ref 98–110)
Creat: 0.8 mg/dL (ref 0.50–0.99)
GFR, Est African American: 89 mL/min/{1.73_m2} (ref >=60)
GFR, Est Non African American: 77 mL/min/{1.73_m2} (ref >=60)
Globulin: 2.2 g/dL (calc) (ref 1.9–3.7)
Glucose, Bld: 99 mg/dL (ref 65–99)
Potassium: 4.5 mmol/L (ref 3.5–5.3)
Sodium: 139 mmol/L (ref 135–146)
Total Bilirubin: 0.5 mg/dL (ref 0.2–1.2)
Total Protein: 6.3 g/dL (ref 6.1–8.1)

## 2019-02-02 LAB — MAGNESIUM: Magnesium: 2 mg/dL (ref 1.5–2.5)

## 2019-02-02 LAB — LIPID PANEL
Cholesterol: 185 mg/dL (ref <200)
HDL: 75 mg/dL (ref >=50)
LDL Cholesterol (Calc): 96 mg/dL (calc)
Non-HDL Cholesterol (Calc): 110 mg/dL (calc) (ref <130)
Total CHOL/HDL Ratio: 2.5 (calc) (ref <5.0)
Triglycerides: 59 mg/dL (ref <150)

## 2019-02-02 LAB — HEMOGLOBIN A1C
Hgb A1c MFr Bld: 5.7 % of total Hgb — ABNORMAL HIGH (ref <5.7)
Mean Plasma Glucose: 117 (calc)
eAG (mmol/L): 6.5 (calc)

## 2019-02-02 LAB — TSH: TSH: 1.04 mIU/L (ref 0.40–4.50)

## 2019-02-02 LAB — VITAMIN D 25 HYDROXY (VIT D DEFICIENCY, FRACTURES): Vit D, 25-Hydroxy: 50 ng/mL (ref 30–100)

## 2019-02-02 LAB — MICROALBUMIN / CREATININE URINE RATIO
Creatinine, Urine: 56 mg/dL (ref 20–275)
Microalb Creat Ratio: 4 mcg/mg creat (ref <30)
Microalb, Ur: 0.2 mg/dL

## 2019-02-02 LAB — INSULIN, RANDOM: Insulin: 7.4 u[IU]/mL

## 2019-02-02 NOTE — Telephone Encounter (Signed)
Dr. Carlean Purl, please advise. Original colon recall for 2020, then changed to 2022. The patient is requesting if this is the case to please speak with her PCP. She desired to have her colonoscopy scheduled.

## 2019-02-02 NOTE — Telephone Encounter (Signed)
Patient calling- wanted to schedule her colonoscopy that was due this year- I let her know that Dr. Carlean Purl has changed the colonoscopy to be due in 2022. She said that her PCP wants her to have it because she states she has had infections. I told her I would put a message in to ask Dr. Carlean Purl if he was okay with her going ahead and having colonoscopy- she stated just have Dr. Carlean Purl talk to Dr. Melford Aase. I told her someone would reach back out to her to discuss it.

## 2019-02-03 NOTE — Telephone Encounter (Signed)
Please explain to her that when I did colonoscopy in 2015 the recommendations were to repeat as early as 5 and as late as 10 years.  Now the recommendations for her situation are as early as 61 and as late as 10 years and we do not think she is at increased risk for colon cancer.  So I do not think she needs a colonoscopy now as long as not having signs or symptoms of problems.

## 2019-02-03 NOTE — Telephone Encounter (Signed)
Patient had already been notified by Dr. Suella Broad of Dr. Celesta Aver recommendations.

## 2019-03-10 ENCOUNTER — Other Ambulatory Visit: Payer: Self-pay | Admitting: Internal Medicine

## 2019-03-10 DIAGNOSIS — F411 Generalized anxiety disorder: Secondary | ICD-10-CM

## 2019-03-21 ENCOUNTER — Other Ambulatory Visit: Payer: Self-pay

## 2019-03-21 DIAGNOSIS — Z1211 Encounter for screening for malignant neoplasm of colon: Secondary | ICD-10-CM

## 2019-03-21 LAB — POC HEMOCCULT BLD/STL (HOME/3-CARD/SCREEN)
Card #2 Fecal Occult Blod, POC: NEGATIVE
Card #3 Fecal Occult Blood, POC: NEGATIVE
Fecal Occult Blood, POC: NEGATIVE

## 2019-03-22 DIAGNOSIS — Z1211 Encounter for screening for malignant neoplasm of colon: Secondary | ICD-10-CM | POA: Diagnosis not present

## 2019-03-22 DIAGNOSIS — Z1212 Encounter for screening for malignant neoplasm of rectum: Secondary | ICD-10-CM | POA: Diagnosis not present

## 2019-04-06 ENCOUNTER — Other Ambulatory Visit: Payer: Self-pay | Admitting: Cardiovascular Disease

## 2019-05-05 ENCOUNTER — Ambulatory Visit: Payer: Medicare Other | Admitting: Adult Health

## 2019-05-05 ENCOUNTER — Other Ambulatory Visit: Payer: Self-pay | Admitting: Cardiovascular Disease

## 2019-05-07 ENCOUNTER — Other Ambulatory Visit: Payer: Self-pay | Admitting: Internal Medicine

## 2019-05-07 DIAGNOSIS — F411 Generalized anxiety disorder: Secondary | ICD-10-CM

## 2019-05-07 MED ORDER — DILTIAZEM HCL ER COATED BEADS 120 MG PO CP24: ORAL_CAPSULE | ORAL | 3 refills | Status: DC

## 2019-05-30 ENCOUNTER — Ambulatory Visit: Payer: Medicare Other | Admitting: Adult Health

## 2019-06-10 ENCOUNTER — Other Ambulatory Visit: Payer: Self-pay | Admitting: Physician Assistant

## 2019-06-11 ENCOUNTER — Other Ambulatory Visit: Payer: Self-pay | Admitting: Internal Medicine

## 2019-07-04 ENCOUNTER — Other Ambulatory Visit: Payer: Self-pay

## 2019-07-04 ENCOUNTER — Encounter: Payer: Self-pay | Admitting: Adult Health Nurse Practitioner

## 2019-07-04 ENCOUNTER — Ambulatory Visit (INDEPENDENT_AMBULATORY_CARE_PROVIDER_SITE_OTHER): Payer: Medicare Other | Admitting: Adult Health Nurse Practitioner

## 2019-07-04 VITALS — BP 134/82 | HR 70 | Temp 97.0°F | Ht 69.25 in | Wt 200.0 lb

## 2019-07-04 DIAGNOSIS — I493 Ventricular premature depolarization: Secondary | ICD-10-CM | POA: Diagnosis not present

## 2019-07-04 DIAGNOSIS — R7309 Other abnormal glucose: Secondary | ICD-10-CM

## 2019-07-04 DIAGNOSIS — I10 Essential (primary) hypertension: Secondary | ICD-10-CM

## 2019-07-04 DIAGNOSIS — F329 Major depressive disorder, single episode, unspecified: Secondary | ICD-10-CM

## 2019-07-04 DIAGNOSIS — Z0000 Encounter for general adult medical examination without abnormal findings: Secondary | ICD-10-CM

## 2019-07-04 DIAGNOSIS — Z860100 Personal history of colon polyps, unspecified: Secondary | ICD-10-CM

## 2019-07-04 DIAGNOSIS — I480 Paroxysmal atrial fibrillation: Secondary | ICD-10-CM

## 2019-07-04 DIAGNOSIS — I712 Thoracic aortic aneurysm, without rupture, unspecified: Secondary | ICD-10-CM

## 2019-07-04 DIAGNOSIS — M19041 Primary osteoarthritis, right hand: Secondary | ICD-10-CM

## 2019-07-04 DIAGNOSIS — J449 Chronic obstructive pulmonary disease, unspecified: Secondary | ICD-10-CM

## 2019-07-04 DIAGNOSIS — M19042 Primary osteoarthritis, left hand: Secondary | ICD-10-CM

## 2019-07-04 DIAGNOSIS — F419 Anxiety disorder, unspecified: Secondary | ICD-10-CM

## 2019-07-04 DIAGNOSIS — E782 Mixed hyperlipidemia: Secondary | ICD-10-CM

## 2019-07-04 DIAGNOSIS — F32A Depression, unspecified: Secondary | ICD-10-CM

## 2019-07-04 DIAGNOSIS — R0789 Other chest pain: Secondary | ICD-10-CM

## 2019-07-04 DIAGNOSIS — Z79899 Other long term (current) drug therapy: Secondary | ICD-10-CM

## 2019-07-04 DIAGNOSIS — E663 Overweight: Secondary | ICD-10-CM

## 2019-07-04 NOTE — Progress Notes (Addendum)
Patient contact via MyChar 07/06/19 & 07/07/19.  Reporting loose diarrhea and concern for infection like she has had in the past.  She wants [place] to set up colonoscopy for her.  Reviewed chart with Dr Melford Aase today.  Plan of care will consist of referral to GI.  She is up to date with her screening colonoscopy, last 2015.  Two sessile polyps measuring 3 and 5 mm in size were found at the cecum and in the sigmoid colon, cytology negative next screening due 2025.    MEDICARE ANNUAL WELLNESS VISIT AND FOLLOW UP  Assessment:   Diagnoses and all orders for this visit:  Welcome to Medicare preventive visit EKG AAA screening - smoker, known hx of aneurysm  Thoracic aortic aneurysm without rupture ([place]) Control BP, followed by Dr. Roxan Hockey Monitor annually  - Due for this 08/2019  PVC's (premature ventricular contractions) Monitor, followed by Dr. Gwenlyn Found  Paroxysmal atrial fibrillation [facility]) Last documented remote; recently controlled post CV Continue to monitor closely, continue rate controlling medications  Essential hypertension Continue medication Monitor blood pressure at home; call if consistently over 130/80 Continue DASH diet.   Reminder to go to the ER if any CP, SOB, nausea, dizziness, severe HA, changes vision/speech, left arm numbness and tingling and jaw pain.  Chronic obstructive pulmonary disease, unspecified COPD type ([place]) Asymptomatic Continue to monitor  Vitamin D deficiency Continue supplementation Check vitamin D level  Personal history of colonic polyp- adenoma Follow up colonoscopy due 2025  Overweight (BMI 25.0-29.9) Long discussion about weight loss, diet, and exercise Recommended diet heavy in fruits and veggies and low in animal meats, cheeses, and dairy products, appropriate calorie intake Discussed appropriate weight for height Follow up at next visit  Other abnormal glucose Recent A1Cs at goal Discussed diet/exercise, weight management   Defer A1C; check CMP  Medication management Continued  Hyperlipidemia Continue medications, rosuvastatin 3 days a week. Continue low cholesterol diet and exercise.  Check lipid panel.   Atypical chest pain Not having recently Workup by Dr. Gwenlyn Found unremarkable  Anxiety and depression Well managed by current regimen; continue medications, continue working on tapering down benzo to avoid daily use Stress management techniques discussed, increase water, good sleep hygiene discussed, increase exercise, and increase veggies.    Overweight BMI 25-29 Discussed dietary and exercise modifications   Osteoarthritis, bilateral Discussed supportive care, warm moist heat. Discussed RA work up? Consider Meloxicam for pain management   Over 30 minutes of face to face exam, counseling, chart review and critical decision making was performed Future Appointments  Date Time Provider [place]  08/09/2019 10:30 AM Unk Pinto, MD [facility] None  02/20/2020 11:00 AM Unk Pinto, MD [facility] None     Plan:   During the course of the visit the patient was educated and counseled about appropriate screening and preventive services including:    Pneumococcal vaccine   Prevnar 13  Influenza vaccine  Td vaccine  Screening electrocardiogram  Bone densitometry screening  Colorectal cancer screening  Diabetes screening  Glaucoma screening  Nutrition counseling   Advanced directives: requested   Subjective:  Taylor Li is a 69 y.o. female who presents for Medicare Annual Wellness Visit and 3 month follow up. She has remote pAfib in 2002 & 2004 (Req CV), she has history of atypical CP and was evaluated by Dr. Gwenlyn Found, Her 2D echo was entirely normal except for a dilated thoracic aorta measuring 43 mm (has followed up with Dr. Roxan Hockey).  A Myoview stress  test was normal as well.  Her event monitor at that time showed PVCs.   She reports she is having some labile  hypertension.  She reports large swings in her blood pressure readings.  She monitored at home and got 96/70 and later 138/85.  She denies any shortness of breath although she does get fatigued easily.  She denies any chest pains.  She has history of palpitations and takes cardizem 120mg  for this.  She used to take metoprolol BID and this was discontinued related to hypotenstion 08/2018.  She also take telmisartan 20mg  daily.  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  DIP and PIP joint enlargement.  She has started taking CBD oil, glucosamin and tumerick extract to help with th inflammation.  She reports that it is intermittent in nature.  She has tried splints to help but reports it is not helping.  She reports she has tried other medicationa in the past that helped but does not want any medicine.  She has not been doing any heat or ice application.  Discussed RA/autoimmune work up.  She reports she has had this in the past.  2018 she had Negative PMR work up from our office.  she has a diagnosis of depression/anxiety and is currently on wellbutrin 300 mg daily, PRN xanax, reports symptoms are fairly well controlled on current regimen. she currently takes 1 at night for sleep and this is working well.  CBD for shoulder as well. She has scoliosis as well.  Has history of smoking.   BMI is Body mass index is 29.32 kg/m., she has been working on diet and exercise. Though her activity is impaired by her hands and feet at times. Wt Readings from Last 3 Encounters:  07/04/19 200 lb (90.7 kg)  02/01/19 202 lb (91.6 kg)  08/26/18 195 lb 3.2 oz (88.5 kg)    Her blood pressure today their BP is BP: 134/82 She does not workout. She denies chest pain, shortness of breath, dizziness.   She is not on cholesterol medication, taking triple omega 3 supplement (rosuvastatin 5 mg three days a week)  Was taking alirocumab via cardiology, but cannot afford, severe myalgias with statin).   Her cholesterol is  at goal. The cholesterol last visit was:   Lab Results  Component Value Date   CHOL 185 02/01/2019   HDL 75 02/01/2019   LDLCALC 96 02/01/2019   TRIG 59 02/01/2019   CHOLHDL 2.5 02/01/2019    She has not been working on diet and exercise for glucose management, and denies foot ulcerations, increased appetite, nausea, paresthesia of the feet, polydipsia, polyuria and visual disturbances. Last A1C in the office was:  Lab Results  Component Value Date   HGBA1C 5.7 (H) 02/01/2019   Last GFR: Lab Results  Component Value Date   GFRNONAA 77 02/01/2019   Patient is newly on Vitamin D supplement.   Lab Results  Component Value Date   VD25OH 50 02/01/2019      Medication Review: Current Outpatient Medications on File Prior to Visit  Medication Sig Dispense Refill  . ALPRAZolam (XANAX) 1 MG tablet Take 1/2 - 1 tablet 2 - 3 x /day ONLY if needed for Anxiety Attack &  limit to 5 days /week to avoid Addiction & Dementia 60 tablet 0  . Ascorbic Acid (VITAMIN C) 1000 MG tablet Take 1,000 mg by mouth daily.    Marland Kitchen aspirin 81 MG tablet Take 81 mg by mouth daily.    Marland Kitchen BLACK PEPPER-TURMERIC  PO Take 1,000 mg by mouth daily.     Marland Kitchen buPROPion (WELLBUTRIN XL) 300 MG 24 hr tablet Take 1 tablet Daily for Mood 90 tablet 3  . Cholecalciferol (VITAMIN D PO) Take 5,000 Units by mouth 2 (two) times daily.     . citalopram (CELEXA) 40 MG tablet Take 1 tablet Daily for Mood (Patient taking differently: Take 1/2 tablet Daily for Mood) 90 tablet 3  . cyclobenzaprine (FLEXERIL) 10 MG tablet Take 1/2 to 1 tablet 2 to 3 x /day if needed for Muscle Spasm 90 tablet 0  . diltiazem (CARDIZEM CD) 120 MG 24 hr capsule Take 1 capsule Daily for Heart Rhythm 90 capsule 3  . Fexofenadine HCl (ALLEGRA PO) Take by mouth daily.    . Glucosamine-Chondroitin 500-400 MG CAPS Take 1 capsule by mouth daily.    . Magnesium 500 MG TABS Take by mouth. Takes 2 tablets daily    . Multiple Vitamin (MULTIVITAMIN) tablet Take 1 tablet by  mouth daily.    . Omega-3 Fatty Acids (FISH OIL) 1000 MG CAPS Take by mouth daily.    Marland Kitchen OVER THE COUNTER MEDICATION Takes Metamucil as needed.    . Probiotic Product (PROBIOTIC DAILY PO) Take 1 tablet by mouth daily.    . rosuvastatin (CRESTOR) 5 MG tablet Take 1 tablet daily for Cholesterol (Patient taking differently: Take 1 tablet three times a week) 90 tablet 3  . telmisartan (MICARDIS) 40 MG tablet Take 1 tablet Daily for BP 90 tablet 3  . Coenzyme Q10 (COQ-10) 100 MG CAPS Take 2 capsules by mouth daily. (Patient not taking: Reported on 07/04/2019)     No current facility-administered medications on file prior to visit.    Allergies  Allergen Reactions  . Rosuvastatin     myalgias    Current Problems (verified) Patient Active Problem List   Diagnosis Date Noted  . PVC's (premature ventricular contractions) 09/04/2017  . Atypical chest pain 09/04/2017  . Thoracic aortic aneurysm ([place]) 09/04/2017  . Overweight (BMI 25.0-29.9) 03/10/2017  . COPD (chronic obstructive pulmonary disease) ([place]) 09/29/2016  . Anxiety and depression 12/06/2013  . Personal history of colonic polyp- adenoma 09/22/2013  . Medication management 09/09/2013  . Essential hypertension 01/14/2013  . Hyperlipidemia 01/14/2013  . Vitamin D deficiency 01/14/2013  . Other abnormal glucose 01/14/2013  . Paroxysmal Afib 01/14/2013    Screening Tests Immunization History  Administered Date(s) Administered  . DTaP 01/13/2001  . Influenza Inj Mdck Quad With Preservative 09/30/2016  . Influenza Whole 10/06/2012  . Influenza, High Dose Seasonal PF 10/15/2017  . Influenza, Quadrivalent, Recombinant, Inj, Pf 10/15/2018  . Influenza,inj,quad, With Preservative 10/18/2015  . Influenza-Unspecified 11/11/2013, 11/15/2014  . PFIZER SARS-COV-2 Vaccination 02/25/2019, 03/22/2019  . PPD Test 01/14/2013, 04/17/2014, 04/16/2015, 05/15/2016  . Pneumococcal Conjugate-13 06/15/2017  . Pneumococcal Polysaccharide-23  01/13/2001, 02/01/2019  . Tdap 10/18/2015  . Zoster 11/19/2009    Preventative care: Last colonoscopy: 09/2013 - repeat 2020 per Dr. Carlean Purl Last mammogram: has implants, last 2015, declines Last pap smear/pelvic exam: hysterectomy   DEXA: 2011, patient req to defer to next  Prior vaccinations: TD or Tdap: 2017  Influenza: 2018  Pneumococcal: 2021 Prevnar13: 06/2017 Shingles/Zostavax: 2011 Shringrix: Discussed vaccination   Names of Other Physician/Practitioners you currently use: 1. [facility] here for primary care 2. [facility], eye doctor, last visit 2000, Due for 2021 3. Dr, Lavonne Chick dentist, last visit 2021  Patient Care Team: Unk Pinto, MD as PCP - General (Internal  Medicine) [name], MD as PCP - Cardiology (Cardiology)  SURGICAL HISTORY She  has a past surgical history that includes Laminectomy (1982); Breast surgery (Bilateral, 1983); Vaginal hysterectomy (1985); Tonsillectomy; Nasal septum surgery; Cardioversion; Colonoscopy; Appendectomy; Cholecystectomy; and Cardiac defibrillator placement. FAMILY HISTORY Her family history includes Atrial fibrillation in her mother; Barrett's esophagus in her mother; Hypertension in her father and mother; Lung cancer in her father; Mitral valve prolapse in her mother; Thyroid disease in her father. SOCIAL HISTORY She  reports that she quit smoking about 7 years ago. She has quit using smokeless tobacco. She reports current alcohol use of about 3.0 standard drinks of alcohol per week. She reports that she does not use drugs.   MEDICARE WELLNESS OBJECTIVES: Physical activity: Current Exercise Habits: Home exercise routine, Type of exercise: walking (Knows she needs to increase her activity), Time (Minutes): 50, Frequency (Times/Week): 1, Weekly Exercise (Minutes/Week): 50, Intensity: Mild, Exercise limited by: orthopedic condition(s) Cardiac risk factors:   Depression/mood  screen:   Depression screen [facility] 2/9 07/04/2019  Decreased Interest 0  Down, Depressed, Hopeless 0  PHQ - 2 Score 0    ADLs:  In your present state of health, do you have any difficulty performing the following activities: 07/04/2019 01/31/2019  Hearing? N N  Vision? N N  Difficulty concentrating or making decisions? N N  Walking or climbing stairs? N N  Dressing or bathing? N N  Doing errands, shopping? N N  Using the Toilet? N -  Do you have problems with loss of bowel control? N -  Managing your Medications? N -  Managing your Finances? N -  Housekeeping or managing your Housekeeping? N -  Some recent data might be hidden     Cognitive Testing  Alert? Yes  Normal Appearance?Yes  Oriented to person? Yes  Place? Yes   Time? Yes  Recall of three objects?  Yes  Can perform simple calculations? Yes  Displays appropriate judgment?Yes  Can read the correct time from a watch face?Yes  EOL planning: Does Patient Have a Medical Advance Directive?: Yes Type of Advance Directive: Healthcare Power of Attorney, Living will Does patient want to make changes to medical advance directive?: No - Patient declined Copy of [place] in Chart?: No - copy requested  Review of Systems  Constitutional: Negative for malaise/fatigue and weight loss.  HENT: Negative for hearing loss and tinnitus.   Eyes: Negative for blurred vision and double vision.  Respiratory: Negative for cough, sputum production, shortness of breath and wheezing.   Cardiovascular: Negative for chest pain, palpitations, orthopnea, claudication, leg swelling and PND.  Gastrointestinal: Negative for abdominal pain, blood in stool, constipation, diarrhea, heartburn, melena, nausea and vomiting.  Genitourinary: Negative.   Musculoskeletal: Negative for falls, joint pain and myalgias.  Skin: Negative for rash.  Neurological: Negative for dizziness, tingling, sensory change, weakness and headaches.   Endo/Heme/Allergies: Negative for polydipsia.  Psychiatric/Behavioral: Negative.  Negative for depression, memory loss, substance abuse and suicidal ideas. The patient is not nervous/anxious and does not have insomnia.   All other systems reviewed and are negative.    Objective:     Today's Vitals   07/04/19 0954  BP: 134/82  Pulse: 70  Temp: (!) 97 F (36.1 C)  SpO2: 98%  Weight: 200 lb (90.7 kg)  Height: 5' 9.25" (1.759 m)   Body mass index is 29.32 kg/m.  General appearance: alert, no distress, WD/WN, female HEENT: normocephalic, sclerae anicteric, TMs pearly, nares patent, no  discharge or erythema, pharynx normal Oral cavity: MMM, no lesions Neck: supple, no lymphadenopathy, no thyromegaly, no masses Heart: RRR, normal S1, S2, no murmurs Lungs: CTA bilaterally, no wheezes, rhonchi, or rales Abdomen: +bs, soft, non tender, non distended, no masses, no hepatomegaly, no splenomegaly Musculoskeletal: nontender, no swelling, DIP and PIP joint enlargment, tenderness and erythema to left 2nd digit.  Extremities: no edema, no cyanosis, no clubbing Pulses: 2+ symmetric, upper and lower extremities, normal cap refill Neurological: alert, oriented x 3, CN2-12 intact, strength normal upper extremities and lower extremities, sensation normal throughout, DTRs 2+ throughout, no cerebellar signs, gait normal Psychiatric: normal affect, behavior normal, pleasant   Medicare Attestation I have personally reviewed: The patient's medical and social history Their use of alcohol, tobacco or illicit drugs Their current medications and supplements The patient's functional ability including ADLs,fall risks, home safety risks, cognitive, and hearing and visual impairment Diet and physical activities Evidence for depression or mood disorders  The patient's weight, height, BMI, and visual acuity have been recorded in the chart.  I have made referrals, counseling, and provided education to the  patient based on review of the above and I have provided the patient with a written personalized care plan for preventive services.     Garnet Sierras, NP   07/04/2019

## 2019-07-05 LAB — LIPID PANEL
Cholesterol: 199 mg/dL (ref <200)
HDL: 66 mg/dL (ref >=50)
LDL Cholesterol (Calc): 111 mg/dL (calc) — ABNORMAL HIGH
Non-HDL Cholesterol (Calc): 133 mg/dL (calc) — ABNORMAL HIGH (ref <130)
Total CHOL/HDL Ratio: 3 (calc) (ref <5.0)
Triglycerides: 112 mg/dL (ref <150)

## 2019-07-05 LAB — COMPLETE METABOLIC PANEL WITH GFR
AG Ratio: 1.7 (calc) (ref 1.0–2.5)
ALT: 19 U/L (ref 6–29)
AST: 16 U/L (ref 10–35)
Albumin: 4.3 g/dL (ref 3.6–5.1)
Alkaline phosphatase (APISO): 70 U/L (ref 37–153)
BUN: 17 mg/dL (ref 7–25)
CO2: 26 mmol/L (ref 20–32)
Calcium: 9.5 mg/dL (ref 8.6–10.4)
Chloride: 105 mmol/L (ref 98–110)
Creat: 0.75 mg/dL (ref 0.50–0.99)
GFR, Est African American: 96 mL/min/{1.73_m2} (ref >=60)
GFR, Est Non African American: 82 mL/min/{1.73_m2} (ref >=60)
Globulin: 2.6 g/dL (calc) (ref 1.9–3.7)
Glucose, Bld: 95 mg/dL (ref 65–99)
Potassium: 4.5 mmol/L (ref 3.5–5.3)
Sodium: 141 mmol/L (ref 135–146)
Total Bilirubin: 0.6 mg/dL (ref 0.2–1.2)
Total Protein: 6.9 g/dL (ref 6.1–8.1)

## 2019-07-05 LAB — CBC WITH DIFFERENTIAL/PLATELET
Absolute Monocytes: 518 cells/uL (ref 200–950)
Basophils Absolute: 21 cells/uL (ref 0–200)
Basophils Relative: 0.3 %
Eosinophils Absolute: 133 cells/uL (ref 15–500)
Eosinophils Relative: 1.9 %
HCT: 43.6 % (ref 35.0–45.0)
Hemoglobin: 14.4 g/dL (ref 11.7–15.5)
Lymphs Abs: 2324 cells/uL (ref 850–3900)
MCH: 29.7 pg (ref 27.0–33.0)
MCHC: 33 g/dL (ref 32.0–36.0)
MCV: 89.9 fL (ref 80.0–100.0)
MPV: 10.5 fL (ref 7.5–12.5)
Monocytes Relative: 7.4 %
Neutro Abs: 4004 cells/uL (ref 1500–7800)
Neutrophils Relative %: 57.2 %
Platelets: 296 10*3/uL (ref 140–400)
RBC: 4.85 10*6/uL (ref 3.80–5.10)
RDW: 12.2 % (ref 11.0–15.0)
Total Lymphocyte: 33.2 %
WBC: 7 10*3/uL (ref 3.8–10.8)

## 2019-07-05 LAB — URIC ACID: Uric Acid, Serum: 4.4 mg/dL (ref 2.5–7.0)

## 2019-07-07 ENCOUNTER — Other Ambulatory Visit: Payer: Self-pay | Admitting: Adult Health Nurse Practitioner

## 2019-07-07 ENCOUNTER — Encounter: Payer: Self-pay | Admitting: Adult Health Nurse Practitioner

## 2019-07-07 MED ORDER — BISOPROLOL FUMARATE 5 MG PO TABS: ORAL_TABLET | ORAL | 1 refills | Status: DC

## 2019-07-07 MED ORDER — MELOXICAM 15 MG PO TABS: ORAL_TABLET | ORAL | 1 refills | Status: DC

## 2019-07-07 NOTE — Addendum Note (Signed)
Addended byGarnet Sierras A on: 07/07/2019 04:21 PM   Modules accepted: Orders

## 2019-07-08 ENCOUNTER — Telehealth: Payer: Self-pay | Admitting: Internal Medicine

## 2019-07-08 NOTE — Telephone Encounter (Signed)
Spoke with patient regarding recommendations. Pt scheduled follow up for 08/25/19 at 10:50 am with Dr. Carlean Purl. Pt advised to call back if symptoms worsened to see if there was a sooner appt available.

## 2019-07-08 NOTE — Telephone Encounter (Signed)
Spoke with patient, pt reports "not normal diarrhea" , pt states that symptoms slowed down yesterday and today. Pt reports sharp abdominal pain and urgency about a week or two ago. Tuesday/Wednesday 1-2 episodes of diarrhea. Pt states that it can turn into explosive diarrhea with abdominal pain. Pt states she had a normal stool today and no pain. Pt states that she had her old office note from 2018 that stated to try Florastor. Pt states that she hasn't picked up the Florastor but will purchase it, pt states that she hasn't taken the probiotic in the last couple of days either. Pt advised to restart probiotic, pt states that "I've been trying to eat yogurt to get some good bacteria in there". Pt is concerned about the pain that is associated with the diarrhea.  Pt called earlier this year in regards to getting a colonoscopy scheduled.  Please advise, thank you.

## 2019-07-08 NOTE — Telephone Encounter (Signed)
I suggest we set her up for an office visit - per the latest guidelines, a routine colonoscopy is not due until 2022 - in her case a repeat is advised 7-10 years after last as opposed to the original 5 year recommendation so that is why her time was extended   She may need one sooner if there have been persistent bowel habit changes  So I suggest that she get an appointment in office  She does have a hx of C diff - so if she gets and keeps watery diarrhea we will need to recheck that but since back to [place] now NOT YET

## 2019-07-08 NOTE — Telephone Encounter (Signed)
Patient called and she is seeking medication for she is having a flare up please advise.

## 2019-07-20 ENCOUNTER — Other Ambulatory Visit: Payer: Self-pay | Admitting: Internal Medicine

## 2019-07-20 DIAGNOSIS — F411 Generalized anxiety disorder: Secondary | ICD-10-CM

## 2019-08-02 ENCOUNTER — Telehealth: Payer: Self-pay | Admitting: Adult Health

## 2019-08-02 NOTE — Telephone Encounter (Signed)
Patient called to request 1 yr follow up CTA  Chest Order. Please advise your recommendation.

## 2019-08-03 ENCOUNTER — Other Ambulatory Visit: Payer: Self-pay | Admitting: Adult Health

## 2019-08-03 ENCOUNTER — Other Ambulatory Visit: Payer: Self-pay | Admitting: Internal Medicine

## 2019-08-03 DIAGNOSIS — I712 Thoracic aortic aneurysm, without rupture, unspecified: Secondary | ICD-10-CM

## 2019-08-09 ENCOUNTER — Ambulatory Visit: Payer: Medicare Other | Admitting: Internal Medicine

## 2019-08-22 ENCOUNTER — Telehealth: Payer: Self-pay | Admitting: Internal Medicine

## 2019-08-22 NOTE — Telephone Encounter (Signed)
Pt states that she has been taking Florastor and it has been helping regulate her bowels depending on what she eats as well. Pt wanted to cancel upcoming appt, pt had questions regarding colonoscopy in 2022, pt advised that we will call her around that time to get her scheduled as we do not have that schedule available right now and it will have to be exactly 7 years so expected colon will be around 09/2020, recall in epic. Advised to call with any other questions.

## 2019-08-24 ENCOUNTER — Encounter: Payer: Medicare Other | Admitting: Internal Medicine

## 2019-08-25 ENCOUNTER — Ambulatory Visit: Payer: Medicare Other | Admitting: Internal Medicine

## 2019-08-30 ENCOUNTER — Other Ambulatory Visit: Payer: Self-pay | Admitting: Internal Medicine

## 2019-08-30 DIAGNOSIS — E782 Mixed hyperlipidemia: Secondary | ICD-10-CM

## 2019-09-02 ENCOUNTER — Other Ambulatory Visit: Payer: Self-pay

## 2019-09-02 ENCOUNTER — Ambulatory Visit
Admission: RE | Admit: 2019-09-02 | Discharge: 2019-09-02 | Disposition: A | Discharge status: Home / Self Care | Payer: Medicare Other | Source: Ambulatory Visit | Attending: Adult Health | Admitting: Adult Health

## 2019-09-02 DIAGNOSIS — I700 Atherosclerosis of aorta: Secondary | ICD-10-CM | POA: Diagnosis not present

## 2019-09-02 DIAGNOSIS — I2510 Atherosclerotic heart disease of native coronary artery without angina pectoris: Secondary | ICD-10-CM | POA: Diagnosis not present

## 2019-09-02 DIAGNOSIS — J439 Emphysema, unspecified: Secondary | ICD-10-CM | POA: Diagnosis not present

## 2019-09-02 DIAGNOSIS — I712 Thoracic aortic aneurysm, without rupture, unspecified: Secondary | ICD-10-CM

## 2019-09-02 MED ORDER — IOPAMIDOL (ISOVUE-370) INJECTION 76%
75.0000 mL | Freq: Once | INTRAVENOUS | Status: AC | PRN
  Administered 2019-09-02: 75 mL via INTRAVENOUS

## 2019-09-05 ENCOUNTER — Other Ambulatory Visit: Payer: Self-pay | Admitting: Adult Health

## 2019-09-05 ENCOUNTER — Other Ambulatory Visit: Payer: Self-pay | Admitting: Adult Health Nurse Practitioner

## 2019-09-05 DIAGNOSIS — M19042 Primary osteoarthritis, left hand: Secondary | ICD-10-CM

## 2019-09-14 ENCOUNTER — Other Ambulatory Visit: Payer: Self-pay | Admitting: Internal Medicine

## 2019-09-14 DIAGNOSIS — F411 Generalized anxiety disorder: Secondary | ICD-10-CM

## 2019-10-05 ENCOUNTER — Ambulatory Visit (INDEPENDENT_AMBULATORY_CARE_PROVIDER_SITE_OTHER): Payer: Medicare Other | Admitting: Internal Medicine

## 2019-10-05 ENCOUNTER — Encounter: Payer: Self-pay | Admitting: Internal Medicine

## 2019-10-05 ENCOUNTER — Other Ambulatory Visit: Payer: Self-pay

## 2019-10-05 VITALS — BP 120/78 | HR 63 | Temp 97.2°F | Resp 12 | Ht 69.25 in | Wt 205.4 lb

## 2019-10-05 DIAGNOSIS — E782 Mixed hyperlipidemia: Secondary | ICD-10-CM

## 2019-10-05 DIAGNOSIS — R7303 Prediabetes: Secondary | ICD-10-CM

## 2019-10-05 DIAGNOSIS — Z79899 Other long term (current) drug therapy: Secondary | ICD-10-CM

## 2019-10-05 DIAGNOSIS — I10 Essential (primary) hypertension: Secondary | ICD-10-CM | POA: Diagnosis not present

## 2019-10-05 DIAGNOSIS — E559 Vitamin D deficiency, unspecified: Secondary | ICD-10-CM | POA: Diagnosis not present

## 2019-10-05 DIAGNOSIS — R7309 Other abnormal glucose: Secondary | ICD-10-CM

## 2019-10-05 NOTE — Patient Instructions (Signed)

## 2019-10-05 NOTE — Progress Notes (Signed)
History of Present Illness:       This very nice 68 y.o.   MWF presents for  6 month follow up with HTN, HLD, Pre-Diabetes and Vitamin D Deficiency.       Patient is treated for HTN & BP has been controlled at home. Today's BP: is at goal -  120/78. Patient has had no complaints of any cardiac type chest pain, palpitations, dyspnea / orthopnea / PND, dizziness, claudication, or dependent edema.      Hyperlipidemia is  Not controlled with diet & Rosuvastatin. Patient denies myalgias or other med SE's. Last Lipids were not at goal:  Lab Results  Component Value Date   CHOL 199 07/04/2019   HDL 66 07/04/2019   LDLCALC 111 (H) 07/04/2019   TRIG 112 07/04/2019   CHOLHDL 3.0 07/04/2019    Also, the patient has history of PreDiabetes and has had no symptoms of reactive hypoglycemia, diabetic polys, paresthesias or visual blurring.  Last A1c was near goal:  Lab Results  Component Value Date   HGBA1C 5.7 (H) 02/01/2019       Further, the patient also has history of Vitamin D Deficiency and supplements vitamin D without any suspected side-effects. Last vitamin D was not at goal (70-100):  Lab Results  Component Value Date   VD25OH 50 02/01/2019    Current Outpatient Medications on File Prior to Visit  Medication Sig  . ALPRAZolam (XANAX) 1 MG tablet TAKE 1/2 - 1 TABLET 2 - 3 X /DAY ONLY IF NEEDED FOR ANXIETY ATTACK & LIMIT TO 5 DAYS /WEEK TO AVOID ADDICTION & DEMENTIA  . Ascorbic Acid (VITAMIN C) 1000 MG tablet Take 1,000 mg by mouth daily.  Marland Kitchen aspirin 81 MG tablet Take 81 mg by mouth daily.  . bisoprolol (ZEBETA) 5 MG tablet Take one tablet every night for blood pressure.  Marland Kitchen BLACK PEPPER-TURMERIC PO Take 1,000 mg by mouth daily.   Marland Kitchen buPROPion (WELLBUTRIN XL) 300 MG 24 hr tablet Take 1 tablet Daily for Mood, Focus & Concentration  . Cholecalciferol (VITAMIN D PO) Take 5,000 Units by mouth 2 (two) times daily.   . citalopram (CELEXA) 40 MG tablet Take 1 tablet Daily for Mood  (Patient taking differently: Take 1/2 tablet Daily for Mood)  . Coenzyme Q10 (COQ-10) 100 MG CAPS Take 2 capsules by mouth daily. (Patient not taking: Reported on 07/04/2019)  . cyclobenzaprine (FLEXERIL) 10 MG tablet TAKE 1/2 TO 1 TABLET 2 TO 3 X /DAY IF NEEDED FOR MUSCLE SPASM  . diltiazem (CARDIZEM CD) 120 MG 24 hr capsule Take 1 capsule Daily for Heart Rhythm  . Fexofenadine HCl (ALLEGRA PO) Take by mouth daily.  . Glucosamine-Chondroitin 500-400 MG CAPS Take 1 capsule by mouth daily.  . Magnesium 500 MG TABS Take by mouth. Takes 2 tablets daily  . meloxicam (MOBIC) 15 MG tablet Take 1/2-1 tab daily as needed with food for pain. DO NOT take with aleve, ibuprofen. May take tylenol.  . Multiple Vitamin (MULTIVITAMIN) tablet Take 1 tablet by mouth daily.  . Omega-3 Fatty Acids (FISH OIL) 1000 MG CAPS Take by mouth daily.  Marland Kitchen OVER THE COUNTER MEDICATION Takes Metamucil as needed.  . Probiotic Product (PROBIOTIC DAILY PO) Take 1 tablet by mouth daily.  . rosuvastatin (CRESTOR) 5 MG tablet Take 1 tablet    Daily    For Cholesterol  . telmisartan (MICARDIS) 40 MG tablet Take 1 tablet Daily for BP     Allergies  Allergen Reactions  . Rosuvastatin     myalgias    PMHx:   Past Medical History:  Diagnosis Date  . A-fib ([place])   . Arthritis   . C. difficile diarrhea   . Hyperlipidemia   . Hypertension   . Personal history of colonic polyp- adenoma 09/22/2013  . Pre-diabetes   . Vitamin D deficiency     Immunization History  Administered Date(s) Administered  . DTaP 01/13/2001  . Influenza Inj Mdck Quad With Preservative 09/30/2016  . Influenza Whole 10/06/2012  . Influenza, High Dose Seasonal PF 10/15/2017  . Influenza, Quadrivalent, Recombinant, Inj, Pf 10/15/2018  . Influenza,inj,quad, With Preservative 10/18/2015  . Influenza-Unspecified 11/11/2013, 11/15/2014  . PFIZER SARS-COV-2 Vaccination 02/25/2019, 03/22/2019  . PPD Test 01/14/2013, 04/17/2014, 04/16/2015, 05/15/2016  .  Pneumococcal Conjugate-13 06/15/2017  . Pneumococcal Polysaccharide-23 01/13/2001, 02/01/2019  . Tdap 10/18/2015  . Zoster 11/19/2009    FHx:    Reviewed / unchanged  SHx:    Reviewed / unchanged   Systems Review:  Constitutional: Denies fever, chills, wt changes, headaches, insomnia, fatigue, night sweats, change in appetite. Eyes: Denies redness, blurred vision, diplopia, discharge, itchy, watery eyes.  ENT: Denies discharge, congestion, post nasal drip, epistaxis, sore throat, earache, hearing loss, dental pain, tinnitus, vertigo, sinus pain, snoring.  CV: Denies chest pain, palpitations, irregular heartbeat, syncope, dyspnea, diaphoresis, orthopnea, PND, claudication or edema. Respiratory: denies cough, dyspnea, DOE, pleurisy, hoarseness, laryngitis, wheezing.  Gastrointestinal: Denies dysphagia, odynophagia, heartburn, reflux, water brash, abdominal pain or cramps, nausea, vomiting, bloating, diarrhea, constipation, hematemesis, melena, hematochezia  or hemorrhoids. Genitourinary: Denies dysuria, frequency, urgency, nocturia, hesitancy, discharge, hematuria or flank pain. Musculoskeletal: Denies arthralgias, myalgias, stiffness, jt. swelling, pain, limping or strain/sprain.  Skin: Denies pruritus, rash, hives, warts, acne, eczema or change in skin lesion(s). Neuro: No weakness, tremor, incoordination, spasms, paresthesia or pain. Psychiatric: Denies confusion, memory loss or sensory loss. Endo: Denies change in weight, skin or hair change.  Heme/Lymph: No excessive bleeding, bruising or enlarged lymph nodes.  Physical Exam  BP 120/78   Pulse 63   Temp (!) 97.2 F (36.2 C)   Wt 205 lb 6.4 oz (93.2 kg)   SpO2 96%   BMI 30.11 kg/m   Appears  well nourished, well groomed  and in no distress.  Eyes: PERRLA, EOMs, conjunctiva no swelling or erythema. Sinuses: No frontal/maxillary tenderness ENT/Mouth: EAC's clear, TM's nl w/o erythema, bulging. Nares clear w/o erythema,  swelling, exudates. Oropharynx clear without erythema or exudates. Oral hygiene is good. Tongue normal, non obstructing. Hearing intact.  Neck: Supple. Thyroid not palpable. Car 2+/2+ without bruits, nodes or JVD. Chest: Respirations nl with BS clear & equal w/o rales, rhonchi, wheezing or stridor.  Cor: Heart sounds normal w/ regular rate and rhythm without sig. murmurs, gallops, clicks or rubs. Peripheral pulses normal and equal  without edema.  Abdomen: Soft & bowel sounds normal. Non-tender w/o guarding, rebound, hernias, masses or organomegaly.  Lymphatics: Unremarkable.  Musculoskeletal: Full ROM all peripheral extremities, joint stability, 5/5 strength and normal gait.  Skin: Warm, dry without exposed rashes, lesions or ecchymosis apparent.  Neuro: Cranial nerves intact, reflexes equal bilaterally. Sensory-motor testing grossly intact. Tendon reflexes grossly intact.  Pysch: Alert & oriented x 3.  Insight and judgement nl & appropriate. No ideations.  Assessment and Plan:  1. Essential hypertension  - Continue medication, monitor blood pressure at home.  - Continue DASH diet.  Reminder to go to the ER if any CP,  SOB, nausea, dizziness, severe  HA, changes vision/speech.  - CBC with Differential/Platelet - COMPLETE METABOLIC PANEL WITH GFR - Magnesium - TSH  2. Hyperlipidemia, mixed  - Continue diet/meds, exercise,& lifestyle modifications.  - Continue monitor periodic cholesterol/liver & renal functions   - Lipid panel - TSH  3. Abnormal glucose  - Continue diet, exercise  - Lifestyle modifications.  - Monitor appropriate labs.  - Hemoglobin A1c - Insulin, random  4. Vitamin D deficiency  - Continue supplementation.  - VITAMIN D 25 Hydroxy   5. Prediabetes  - Hemoglobin A1c - Insulin, random  6. Medication management  - CBC with Differential/Platelet - COMPLETE METABOLIC PANEL WITH GFR - Magnesium - Lipid panel - TSH - Hemoglobin A1c - VITAMIN D 25  Hydroxy         `Discussed  regular exercise, BP monitoring, weight control to achieve/maintain BMI less than 25 and discussed med and SE's. Recommended labs to assess and monitor clinical status with further disposition pending results of labs.  I discussed the assessment and treatment plan with the patient. The patient was provided an opportunity to ask questions and all were answered. The patient agreed with the plan and demonstrated an understanding of the instructions.  I provided over 30 minutes of exam, counseling, chart review and  complex critical decision making.    Kirtland Bouchard, MD

## 2019-10-06 LAB — COMPLETE METABOLIC PANEL WITH GFR
AG Ratio: 1.8 (calc) (ref 1.0–2.5)
ALT: 17 U/L (ref 6–29)
AST: 14 U/L (ref 10–35)
Albumin: 4.5 g/dL (ref 3.6–5.1)
Alkaline phosphatase (APISO): 71 U/L (ref 37–153)
BUN: 13 mg/dL (ref 7–25)
CO2: 26 mmol/L (ref 20–32)
Calcium: 9.5 mg/dL (ref 8.6–10.4)
Chloride: 104 mmol/L (ref 98–110)
Creat: 0.76 mg/dL (ref 0.50–0.99)
GFR, Est African American: 94 mL/min/{1.73_m2} (ref >=60)
GFR, Est Non African American: 81 mL/min/{1.73_m2} (ref >=60)
Globulin: 2.5 g/dL (calc) (ref 1.9–3.7)
Glucose, Bld: 96 mg/dL (ref 65–99)
Potassium: 4.2 mmol/L (ref 3.5–5.3)
Sodium: 139 mmol/L (ref 135–146)
Total Bilirubin: 0.7 mg/dL (ref 0.2–1.2)
Total Protein: 7 g/dL (ref 6.1–8.1)

## 2019-10-06 LAB — LIPID PANEL
Cholesterol: 203 mg/dL — ABNORMAL HIGH (ref <200)
HDL: 78 mg/dL (ref >=50)
LDL Cholesterol (Calc): 106 mg/dL (calc) — ABNORMAL HIGH
Non-HDL Cholesterol (Calc): 125 mg/dL (calc) (ref <130)
Total CHOL/HDL Ratio: 2.6 (calc) (ref <5.0)
Triglycerides: 97 mg/dL (ref <150)

## 2019-10-06 LAB — VITAMIN D 25 HYDROXY (VIT D DEFICIENCY, FRACTURES): Vit D, 25-Hydroxy: 45 ng/mL (ref 30–100)

## 2019-10-06 LAB — CBC WITH DIFFERENTIAL/PLATELET
Absolute Monocytes: 538 cells/uL (ref 200–950)
Basophils Absolute: 41 cells/uL (ref 0–200)
Basophils Relative: 0.6 %
Eosinophils Absolute: 138 cells/uL (ref 15–500)
Eosinophils Relative: 2 %
HCT: 42.6 % (ref 35.0–45.0)
Hemoglobin: 14.4 g/dL (ref 11.7–15.5)
Lymphs Abs: 2436 cells/uL (ref 850–3900)
MCH: 30.3 pg (ref 27.0–33.0)
MCHC: 33.8 g/dL (ref 32.0–36.0)
MCV: 89.7 fL (ref 80.0–100.0)
MPV: 11 fL (ref 7.5–12.5)
Monocytes Relative: 7.8 %
Neutro Abs: 3747 cells/uL (ref 1500–7800)
Neutrophils Relative %: 54.3 %
Platelets: 270 10*3/uL (ref 140–400)
RBC: 4.75 10*6/uL (ref 3.80–5.10)
RDW: 13 % (ref 11.0–15.0)
Total Lymphocyte: 35.3 %
WBC: 6.9 10*3/uL (ref 3.8–10.8)

## 2019-10-06 LAB — TSH: TSH: 1.24 mIU/L (ref 0.40–4.50)

## 2019-10-06 LAB — MAGNESIUM: Magnesium: 2.1 mg/dL (ref 1.5–2.5)

## 2019-10-06 LAB — INSULIN, RANDOM: Insulin: 6.7 u[IU]/mL

## 2019-10-06 LAB — HEMOGLOBIN A1C
Hgb A1c MFr Bld: 5.5 % of total Hgb (ref <5.7)
Mean Plasma Glucose: 111 (calc)
eAG (mmol/L): 6.2 (calc)

## 2019-10-06 NOTE — Progress Notes (Addendum)
========================================================== -   Test results slightly outside the reference range are not unusual. If there is anything important, I will review this with you,  otherwise it is considered normal test values.  If you have further questions,  please do not hesitate to contact me at the office or via My Chart.  ==========================================================  -  Total Chol = 203 - slightly elevated  (Ideal or Goal is less than 180)   - and   - Bad LDL Chol = 106 - also too high (Ideal or Goal is less than 70)  - So  - Work a little harder on  low cholesterol diet   - Cholesterol only comes from animal sources  - ie. meat, dairy, egg yolks  - Eat all the vegetables you want.  - Avoid meat, especially red meat - Beef AND Pork .  - Avoid cheese & dairy - milk & ice cream.     - Cheese is the most concentrated form of trans-fats which  is the worst thing to clog up our arteries.   - Veggie cheese is OK which can be found in the fresh  produce section at [employer] or [employer] or [employer] ==========================================================  -  A1c = 5.5% - back to Normal - Great - No Diabetes  - Vitamin D = 45 - Low   - Vitamin D goal is between 70-100.   - Please make sure that you are taking your Vitamin D at  10,000 units /day as directed.   - It is very important as a natural anti-inflammatory and helping the  immune system protect against viral infections, like the Covid-19    helping hair, skin, and nails, as well as reducing stroke and  heart attack risk.   - It helps your bones and helps with mood.  - It also decreases numerous cancer risks so please take it as directed.   - Low Vit D is associated with a 200-300% higher risk for CANCER   and 200-300% higher risk for HEART   ATTACK  &  STROKE.    - It is also associated with higher death rate at younger ages,   autoimmune diseases like Rheumatoid  arthritis, Lupus,  Multiple Sclerosis.     - Also many other serious conditions, like depression, Alzheimer's  Dementia, infertility, muscle aches, fatigue, fibromyalgia  - just to name a few.  ==========================================================  - All Else - CBC - Kidneys - Electrolytes - Liver - Magnesium & Thyroid    - all  Normal / OK ==========================================================

## 2019-10-24 DIAGNOSIS — Z23 Encounter for immunization: Secondary | ICD-10-CM | POA: Diagnosis not present

## 2019-11-13 ENCOUNTER — Other Ambulatory Visit: Payer: Self-pay | Admitting: Internal Medicine

## 2019-11-20 ENCOUNTER — Other Ambulatory Visit: Payer: Self-pay | Admitting: Internal Medicine

## 2019-11-20 DIAGNOSIS — E782 Mixed hyperlipidemia: Secondary | ICD-10-CM

## 2019-11-28 ENCOUNTER — Other Ambulatory Visit: Payer: Self-pay | Admitting: Adult Health

## 2019-11-28 DIAGNOSIS — F411 Generalized anxiety disorder: Secondary | ICD-10-CM

## 2019-12-04 ENCOUNTER — Other Ambulatory Visit: Payer: Self-pay | Admitting: Internal Medicine

## 2019-12-29 ENCOUNTER — Encounter: Payer: Medicare Other | Admitting: Internal Medicine

## 2020-01-01 ENCOUNTER — Other Ambulatory Visit: Payer: Self-pay | Admitting: Adult Health Nurse Practitioner

## 2020-01-03 ENCOUNTER — Ambulatory Visit: Payer: Medicare Other | Admitting: Adult Health Nurse Practitioner

## 2020-01-05 ENCOUNTER — Ambulatory Visit: Payer: Medicare Other | Admitting: Adult Health Nurse Practitioner

## 2020-01-17 ENCOUNTER — Ambulatory Visit: Payer: Medicare Other | Admitting: Adult Health Nurse Practitioner

## 2020-01-26 ENCOUNTER — Other Ambulatory Visit: Payer: Self-pay | Admitting: Adult Health

## 2020-01-26 ENCOUNTER — Other Ambulatory Visit: Payer: Self-pay | Admitting: Internal Medicine

## 2020-01-26 DIAGNOSIS — F411 Generalized anxiety disorder: Secondary | ICD-10-CM

## 2020-02-13 ENCOUNTER — Other Ambulatory Visit: Payer: Self-pay | Admitting: Internal Medicine

## 2020-02-13 ENCOUNTER — Encounter: Payer: Self-pay | Admitting: Internal Medicine

## 2020-02-13 DIAGNOSIS — I10 Essential (primary) hypertension: Secondary | ICD-10-CM

## 2020-02-13 NOTE — Progress Notes (Unsigned)
History of Present Illness:      This very nice 70 yo MWF with hx/o HTN (2002) , hx/o pAfib, HLD, Pre-Diabetes and Vitamin D Deficiency. Patient presents with increased palpitations and c/o fluctuating pulse rates and rhythms  and also BP's fluctuating from 110-145 /60-96 with HR ranging betw 50-65.       Today's EKG not significantly changed showing NSR / 1 deg AVB 252 ms)  / LAD (-65 deg) / Prolonged QT (438 ms)  / UF PVC's & fusion beat, rSr prime /  Inferior Q-waves suspect for Cardiomyopathy.        She has hx/o remote CV x 2 in 2002 & 2004 for pAfib. Hx/o Negative Stress Myoview in 2009 by Dr Gwenlyn Found. Patient is being followed by Dr Roxan Hockey for a dilated Ascending Thoracic Aorta (43 mm).       Her HLD is not controlled with diet & her Rosuvastatin.   Lab Results  Component Value Date   CHOL 203 (H) 10/05/2019   HDL 78 10/05/2019   LDLCALC 106 (H) 10/05/2019   TRIG 97 10/05/2019   CHOLHDL 2.6 10/05/2019                                           Patient has hx/o prediabetes predating (A1c 5.8% /2011 &  5.9% /2011)and she denies reactive hypoglycemic symptoms, visual blurring, diabetic polys or paresthesias. Last A1c was Normal & at goal:  Lab Results  Component Value Date   HGBA1C 5.5 10/05/2019                                         Finally, patient has history of Vitamin D Deficiency("36"/2010)  and last Vitamin D was sl low  (goal 70-100):  Last vitamin D Lab Results  Component Value Date   VD25OH 45 10/05/2019    Medications  .  bisoprolol  5 MG tablet, Take  one tablet every Night for BP  .  diltiazem CD 120 MG 24 hr , Take 1 capsule Daily for Heart Rhythm  .  telmisartan (MICARDIS) 40 MG tablet, Take 1 tablet Daily for BP  .  aspirin 81 MG tablet, Take  Daily.  .  rosuvastatin 5 MG tablet, TAKE 1 TABLET  EVERY DAY FOR CHOLESTEROL  .  Fexofenadine, Take daily.  .  meloxicam 15 MG tablet, Take 1/2-1 tab daily as needed with food for pain. DO NOT  take with aleve, ibuprofen. May take tylenol.  .  ALPRAZolam  1 MG tablet, TAKE 1/2-1 TABLET 2-3X/DAY ONLY IF NEEDED  .  VITAMIN C 1000 MG tablet, Take  daily. Marland Kitchen  BLACK PEPPER-TURMERIC  1,000 mg , Take daily.  Marland Kitchen  buPROPion XL 300 MG , TAKE 1 TABLET DAILY  .  VITAMIN D  5,000 Units, Take  2  times daily.  .  citalopram  40 MG , Take 1 tablet Daily for Mood .  cyclobenzaprine  10 MG tablet, TAKE 1/2-1 TABLET 2-3 x /DAY IF NEEDED  .  Glucosamine-Chondroitin 500-400 MG , Take 1 capsule daily. .  Magnesium 500 MG , T. Takes 2 tablets daily .  Multiple Vitamin , Take 1 tablet  daily. .  Omega-3 FISH OIL 1000 MG CAPS, Take daily. .  Metamucil ,  Takes  as needed. .  Probiotic , Take 1 tablet daily.  Problem list She has Essential hypertension; Hyperlipidemia; Vitamin D deficiency; Other abnormal glucose; Paroxysmal Afib; Personal history of colonic polyp- adenoma; Anxiety and depression; COPD (chronic obstructive pulmonary disease) ([place]); Overweight (BMI 25.0-29.9); PVC's (premature ventricular contractions); Atypical chest pain; and Thoracic aortic aneurysm (HCC) on their problem list.   Observations/Objective:  BP 116/84   Pulse 74   Temp (!) 97.4 F (36.3 C)   Resp 16   Ht 5' 9.25" (1.759 m)   Wt 204 lb (92.5 kg)   SpO2 97%   BMI 29.91 kg/m   HEENT - WNL. Neck - supple.  Chest - Clear equal BS. Cor - Nl HS. RRR w/o sig MGR. PP 1(+). No edema. MS- FROM w/o deformities.  Gait Nl. Neuro -  Nl w/o focal abnormalities.  Assessment and Plan:  1. Essential hypertension, labile   - CBC with Differential/Platelet - COMPLETE METABOLIC PANEL WITH GFR - Magnesium - TSH - EKG 12-Lead  2. Hyperlipidemia, mixed  - Lipid panel - TSH - EKG 12-Lead  3. Abnormal glucose  - Hemoglobin A1c - Insulin, random - EKG 12-Lead  4. Vitamin D deficiency  - VITAMIN D 25 Hydroxy   5. Prediabetes  - Hemoglobin A1c - Insulin, random  6. Thoracic aortic aneurysm without rupture  (HCC)   7. Paroxysmal atrial fibrillation ([place])  - Discussed referral with patient to see Dr Gwenlyn Found  re: long term arrhythmia monitoring, cardiac echo  - TSH - EKG 12-Lead  8. Screening for cardiovascular condition  - EKG 12-Lead  9. Medication management  - CBC with Differential/Platelet - COMPLETE METABOLIC PANEL WITH GFR - Magnesium - Lipid panel - TSH - Hemoglobin A1c - Insulin, random - VITAMIN D 25 Hydroxy  Follow Up Instructions:       I discussed the assessment and treatment plan with the patient. The patient was provided an opportunity to ask questions and all were answered. The patient agreed with the plan and demonstrated an understanding of the instructions.       The patient was advised to call back or seek an in-person evaluation if the symptoms worsen or if the condition fails to improve as anticipated.    Kirtland Bouchard, MD

## 2020-02-14 ENCOUNTER — Other Ambulatory Visit: Payer: Self-pay

## 2020-02-14 ENCOUNTER — Ambulatory Visit (INDEPENDENT_AMBULATORY_CARE_PROVIDER_SITE_OTHER): Payer: Medicare Other | Admitting: Internal Medicine

## 2020-02-14 VITALS — BP 116/84 | HR 74 | Temp 97.4°F | Resp 16 | Ht 69.25 in | Wt 204.0 lb

## 2020-02-14 DIAGNOSIS — R7303 Prediabetes: Secondary | ICD-10-CM | POA: Diagnosis not present

## 2020-02-14 DIAGNOSIS — E559 Vitamin D deficiency, unspecified: Secondary | ICD-10-CM

## 2020-02-14 DIAGNOSIS — I480 Paroxysmal atrial fibrillation: Secondary | ICD-10-CM | POA: Diagnosis not present

## 2020-02-14 DIAGNOSIS — E782 Mixed hyperlipidemia: Secondary | ICD-10-CM

## 2020-02-14 DIAGNOSIS — I4581 Long QT syndrome: Secondary | ICD-10-CM

## 2020-02-14 DIAGNOSIS — I712 Thoracic aortic aneurysm, without rupture, unspecified: Secondary | ICD-10-CM

## 2020-02-14 DIAGNOSIS — R7309 Other abnormal glucose: Secondary | ICD-10-CM | POA: Diagnosis not present

## 2020-02-14 DIAGNOSIS — Z79899 Other long term (current) drug therapy: Secondary | ICD-10-CM

## 2020-02-14 DIAGNOSIS — Z136 Encounter for screening for cardiovascular disorders: Secondary | ICD-10-CM

## 2020-02-14 DIAGNOSIS — I10 Essential (primary) hypertension: Secondary | ICD-10-CM | POA: Diagnosis not present

## 2020-02-14 DIAGNOSIS — I493 Ventricular premature depolarization: Secondary | ICD-10-CM

## 2020-02-14 DIAGNOSIS — I429 Cardiomyopathy, unspecified: Secondary | ICD-10-CM

## 2020-02-14 NOTE — Patient Instructions (Signed)

## 2020-02-15 LAB — CBC WITH DIFFERENTIAL/PLATELET
Absolute Monocytes: 616 cells/uL (ref 200–950)
Basophils Absolute: 31 cells/uL (ref 0–200)
Basophils Relative: 0.4 %
Eosinophils Absolute: 70 cells/uL (ref 15–500)
Eosinophils Relative: 0.9 %
HCT: 42.4 % (ref 35.0–45.0)
Hemoglobin: 14.7 g/dL (ref 11.7–15.5)
Lymphs Abs: 2410 cells/uL (ref 850–3900)
MCH: 30.8 pg (ref 27.0–33.0)
MCHC: 34.7 g/dL (ref 32.0–36.0)
MCV: 88.7 fL (ref 80.0–100.0)
MPV: 11.9 fL (ref 7.5–12.5)
Monocytes Relative: 7.9 %
Neutro Abs: 4672 cells/uL (ref 1500–7800)
Neutrophils Relative %: 59.9 %
Platelets: 241 10*3/uL (ref 140–400)
RBC: 4.78 10*6/uL (ref 3.80–5.10)
RDW: 12.2 % (ref 11.0–15.0)
Total Lymphocyte: 30.9 %
WBC: 7.8 10*3/uL (ref 3.8–10.8)

## 2020-02-15 LAB — COMPLETE METABOLIC PANEL WITH GFR
AG Ratio: 1.7 (calc) (ref 1.0–2.5)
ALT: 20 U/L (ref 6–29)
AST: 16 U/L (ref 10–35)
Albumin: 4.3 g/dL (ref 3.6–5.1)
Alkaline phosphatase (APISO): 72 U/L (ref 37–153)
BUN: 19 mg/dL (ref 7–25)
CO2: 26 mmol/L (ref 20–32)
Calcium: 9.7 mg/dL (ref 8.6–10.4)
Chloride: 104 mmol/L (ref 98–110)
Creat: 0.81 mg/dL (ref 0.50–0.99)
GFR, Est African American: 87 mL/min/{1.73_m2} (ref >=60)
GFR, Est Non African American: 75 mL/min/{1.73_m2} (ref >=60)
Globulin: 2.6 g/dL (calc) (ref 1.9–3.7)
Glucose, Bld: 87 mg/dL (ref 65–99)
Potassium: 4.7 mmol/L (ref 3.5–5.3)
Sodium: 139 mmol/L (ref 135–146)
Total Bilirubin: 0.6 mg/dL (ref 0.2–1.2)
Total Protein: 6.9 g/dL (ref 6.1–8.1)

## 2020-02-15 LAB — INSULIN, RANDOM: Insulin: 7.7 u[IU]/mL

## 2020-02-15 LAB — LIPID PANEL
Cholesterol: 203 mg/dL — ABNORMAL HIGH (ref <200)
HDL: 68 mg/dL (ref >=50)
LDL Cholesterol (Calc): 113 mg/dL (calc) — ABNORMAL HIGH
Non-HDL Cholesterol (Calc): 135 mg/dL (calc) — ABNORMAL HIGH (ref <130)
Total CHOL/HDL Ratio: 3 (calc) (ref <5.0)
Triglycerides: 116 mg/dL (ref <150)

## 2020-02-15 LAB — TSH: TSH: 1.54 mIU/L (ref 0.40–4.50)

## 2020-02-15 LAB — HEMOGLOBIN A1C
Hgb A1c MFr Bld: 5.7 % of total Hgb — ABNORMAL HIGH (ref <5.7)
Mean Plasma Glucose: 117 mg/dL
eAG (mmol/L): 6.5 mmol/L

## 2020-02-15 LAB — VITAMIN D 25 HYDROXY (VIT D DEFICIENCY, FRACTURES): Vit D, 25-Hydroxy: 46 ng/mL (ref 30–100)

## 2020-02-15 LAB — MAGNESIUM: Magnesium: 2.1 mg/dL (ref 1.5–2.5)

## 2020-02-15 NOTE — Progress Notes (Signed)
========================================================== - Test results slightly outside the reference range are not unusual. If there is anything important, I will review this with you,  otherwise it is considered normal test values.  If you have further questions,  please do not hesitate to contact me at the office or via My Chart.  ========================================================== ==========================================================  -  Total Chol = 203 - slightly elevated  (Ideal or Goal is less than 180  ! )   - and   -  LDL Chol = 113 - also too high  (Ideal or Goal is less than 70  ! )   - So - Recommend a stricter low cholesterol diet   - Cholesterol only comes from animal sources  - ie. meat, dairy, egg yolks  - Eat all the vegetables you want.  - Avoid meat, especially red meat - Beef AND Pork .  - Avoid cheese & dairy - milk & ice cream.     - Cheese is the most concentrated form of trans-fats which  is the worst thing to clog up our arteries.   - Veggie cheese is OK which can be found in the fresh  produce section at [employer] or [employer] or [employer] ========================================================== ==========================================================  -  A1c = 5.7% - Blood sugar and is elevated  AGAIN in the borderline and  early or pre-diabetes range which has the same   300% increased risk for heart attack, stroke, cancer and   alzheimer- type vascular dementia as full blown diabetes.   But the good news is that diet, exercise with  weight loss can cure the early diabetes at this point. ===========================================================  -  Being diabetic has a  300% increased risk for heart attack,  stroke, cancer, and alzheimer- type vascular dementia.   -  It is very important that you work harder with diet by  avoiding all foods that are white except chicken,   fish & calliflower.  - Avoid white  rice  (brown & wild rice is OK),   - Avoid white potatoes  (sweet potatoes in moderation is OK),   White bread or wheat bread or anything made out of   white flour like bagels, donuts, rolls, buns, biscuits, cakes,  - pastries, cookies, pizza crust, and pasta (made from  white flour & egg whites)   - vegetarian pasta or spinach or wheat pasta is OK.  - Multigrain breads like Arnold's, Pepperidge Farm or   multigrain sandwich thins or high fiber breads like   [place] bread or "Dave's Killer" breads that are  4 to 5 grams fiber per slice !  are best.    Diet, exercise and weight loss can reverse and cure  diabetes in the early stages.   ========================================================== ==========================================================  -  Vitamin D = 46 - slightly Low   - Vitamin D goal is between 70-100.   - Please make sure that you are taking your Vitamin D 10,000 units / day   - It is very important as a natural anti-inflammatory and helping the  immune system protect against viral infections, like the Covid-19    helping hair, skin, and nails, as well as reducing stroke and  heart attack risk.   - It helps your bones and helps with mood.  - It also decreases numerous cancer risks so please  take it as directed.   - Low Vit D is associated with a 200-300% higher risk for  CANCER   and 200-300% higher risk for  HEART   ATTACK  &  STROKE.    - It is also associated with higher death rate at younger ages,   autoimmune diseases like Rheumatoid arthritis, Lupus,  Multiple Sclerosis.     - Also many other serious conditions, like depression, Alzheimer's  Dementia, infertility, muscle aches, fatigue, fibromyalgia   - just to name a few. ========================================================== ==========================================================  -  All Else - CBC - Kidneys - Electrolytes - Liver - Magnesium & Thyroid    - all  Normal  / OK ===========================================================  ===========================================================

## 2020-02-16 ENCOUNTER — Encounter: Payer: Self-pay | Admitting: Cardiology

## 2020-02-16 ENCOUNTER — Ambulatory Visit (INDEPENDENT_AMBULATORY_CARE_PROVIDER_SITE_OTHER): Payer: Medicare Other

## 2020-02-16 ENCOUNTER — Encounter: Payer: Self-pay | Admitting: Radiology

## 2020-02-16 ENCOUNTER — Telehealth (INDEPENDENT_AMBULATORY_CARE_PROVIDER_SITE_OTHER): Payer: Medicare Other | Admitting: Cardiology

## 2020-02-16 VITALS — BP 136/86 | HR 52 | Ht 69.25 in | Wt 203.0 lb

## 2020-02-16 DIAGNOSIS — R0600 Dyspnea, unspecified: Secondary | ICD-10-CM

## 2020-02-16 DIAGNOSIS — I493 Ventricular premature depolarization: Secondary | ICD-10-CM

## 2020-02-16 DIAGNOSIS — E782 Mixed hyperlipidemia: Secondary | ICD-10-CM

## 2020-02-16 DIAGNOSIS — R5383 Other fatigue: Secondary | ICD-10-CM | POA: Diagnosis not present

## 2020-02-16 DIAGNOSIS — I712 Thoracic aortic aneurysm, without rupture, unspecified: Secondary | ICD-10-CM

## 2020-02-16 DIAGNOSIS — R0609 Other forms of dyspnea: Secondary | ICD-10-CM

## 2020-02-16 DIAGNOSIS — I10 Essential (primary) hypertension: Secondary | ICD-10-CM | POA: Diagnosis not present

## 2020-02-16 NOTE — Patient Instructions (Signed)
Medication Instructions:  No Changes *If you need a refill on your cardiac medications before your next appointment, please call your pharmacy*   Lab Work: No Labs If you have labs (blood work) drawn today and your tests are completely normal, you will receive your results only by: Marland Kitchen MyChart Message (if you have MyChart) OR . A paper copy in the mail If you have any lab test that is abnormal or we need to change your treatment, we will call you to review the results.   Testing/Procedures: [address] [place] has requested that you have an echocardiogram. Echocardiography is a painless test that uses sound waves to create images of your heart. It provides your doctor with information about the size and shape of your heart and how well your heart's chambers and valves are working. This procedure takes approximately one hour. There are no restrictions for this procedure.  [place], [address] Your physician has requested that you have a lexiscan myoview. For further information please visit [email or web address]. Please follow instruction sheet, as given.  ZIO XT- Long Term Monitor Instructions   Your physician has requested you wear your ZIO patch monitor___7____days.   This is a single patch monitor.  [employer] supplies one patch monitor per enrollment.  Additional stickers are not available.   Please do not apply patch if you will be having a Nuclear Stress Test, Echocardiogram, Cardiac CT, MRI, or Chest Xray during the time frame you would be wearing the monitor. The patch cannot be worn during these tests.  You cannot remove and re-apply the ZIO XT patch monitor.   Your ZIO patch monitor will be sent USPS Priority mail from [facility] directly to your home address. The monitor may also be mailed to a PO BOX if home delivery is not available.   It may take 3-5 days to receive your monitor after you have been enrolled.   Once you have  received you monitor, please review enclosed instructions.  Your monitor has already been registered assigning a specific monitor serial # to you.   Applying the monitor   Shave hair from upper left chest.   Hold abrader disc by orange tab.  Rub abrader in 40 strokes over left upper chest as indicated in your monitor instructions.   Clean area with 4 enclosed alcohol pads .  Use all pads to assure are is cleaned thoroughly.  Let dry.   Apply patch as indicated in monitor instructions.  Patch will be place under collarbone on left side of chest with arrow pointing upward.   Rub patch adhesive wings for 2 minutes.Remove white label marked "1".  Remove white label marked "2".  Rub patch adhesive wings for 2 additional minutes.   While looking in a mirror, press and release button in center of patch.  A small green light will flash 3-4 times .  This will be your only indicator the monitor has been turned on.     Do not shower for the first 24 hours.  You may shower after the first 24 hours.   Press button if you feel a symptom. You will hear a small click.  Record Date, Time and Symptom in the Patient Log Book.   When you are ready to remove patch, follow instructions on last 2 pages of Patient Log Book.  Stick patch monitor onto last page of Patient Log Book.   Place Patient Log Book in [place] box.  Use locking tab  on box and tape box closed securely.  The [employer] has [employer] on it.  Please place in mailbox as soon as possible.  Your physician should have your test results approximately 7 days after the monitor has been mailed back to [facility].   Call [place] at [phone] if you have questions regarding your ZIO XT patch monitor.  Call them immediately if you see an orange light blinking on your monitor.   If your monitor falls off in less than 4 days contact our Monitor department at [phone].  If your monitor becomes loose or falls off  after 4 days call [employer] at [phone] for suggestions on securing your monitor.    Follow-Up: At [facility], you and your health needs are our priority.  As part of our continuing mission to provide you with exceptional heart care, we have created designated Provider Care Teams.  These Care Teams include your primary Cardiologist (physician) and Advanced Practice Providers (APPs -  Physician Assistants and Nurse Practitioners) who all work together to provide you with the care you need, when you need it.    Your next appointment:   April 11, 2020 11:45 AM  The format for your next appointment:   In Person  Provider:   Quay Burow, MD

## 2020-02-16 NOTE — Progress Notes (Signed)
Enrolled patient for a 7 day Zio XT monitor to be mailed to patients home.  

## 2020-02-16 NOTE — Addendum Note (Signed)
Addended by: Merri Ray A on: 02/16/2020 10:51 AM   Modules accepted: Orders

## 2020-02-16 NOTE — Progress Notes (Signed)
Virtual Visit via Telephone Note   This visit type was conducted due to national recommendations for restrictions regarding the COVID-19 Pandemic (e.g. social distancing) in an effort to limit this patient's exposure and mitigate transmission in our community.  Due to her co-morbid illnesses, this patient is at least at moderate risk for complications without adequate follow up.  This format is felt to be most appropriate for this patient at this time.  The patient did not have access to video technology/had technical difficulties with video requiring transitioning to audio format only (telephone).  All issues noted in this document were discussed and addressed.  No physical exam could be performed with this format.  Please refer to the patient's chart for her  consent to telehealth for [facility].    Date:  02/16/2020   ID:  Taylor Li, DOB [birth year], MRN [id number] The patient was identified using 2 identifiers.  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  She was evaluated in 2019 by Dr. Gwenlyn Found.  She had a low risk Myoview and a normal echocardiogram.  Outpatient monitor showed PVCs and a four beat run of nonsustained VT.  The patient was reassured.  She does have a ascending aortic aneurysm of 4.3 cm, this is followed at [facility].  The patient was seen today as a referral from her primary care provider.  The patient complains of profound fatigue.  She says her heart rate is low at times at home.  She is also had erratic blood pressure readings on her home machine.  She has dyspnea on exertion.  She definitely feels there is something going on that is new.  The patient does not have  symptoms concerning for COVID-19 infection (fever, chills, cough, or new shortness of breath).    Past Medical History:  Diagnosis Date  . A-fib ([place])   . Arthritis   . C. difficile diarrhea   . Hyperlipidemia   . Hypertension   . Personal history of colonic polyp- adenoma 09/22/2013  . Pre-diabetes   . Vitamin D deficiency    Past Surgical History:  Procedure Laterality Date  . APPENDECTOMY    . BREAST SURGERY Bilateral 1983   SQ mastectomies  . CARDIAC DEFIBRILLATOR PLACEMENT    . CARDIOVERSION    . CHOLECYSTECTOMY    . COLONOSCOPY    . LAMINECTOMY  1982   L4-L5  . NASAL SEPTUM SURGERY    . TONSILLECTOMY    . VAGINAL HYSTERECTOMY  1985     Current Meds  Medication Sig  . ALPRAZolam (XANAX) 1 MG tablet Take 1 mg by mouth at bedtime. Take 1 tablet at Bedtime  . Ascorbic Acid (VITAMIN C) 1000 MG tablet Take 1,000 mg by mouth daily.  Marland Kitchen aspirin 81 MG tablet Take 81 mg by mouth daily.  . bisoprolol (ZEBETA) 10 MG tablet Take 10 mg by mouth 2 (two) times daily. Take 1/2  Half Tablet Twice Daily  . BLACK PEPPER-TURMERIC PO Take 1,000 mg by mouth daily.   Marland Kitchen buPROPion (WELLBUTRIN XL) 300 MG 24 hr tablet TAKE 1 TABLET DAILY FOR MOOD, FOCUS & CONCENTRATION  . Cholecalciferol (VITAMIN D PO) Take 5,000 Units by mouth 2 (two) times  daily.   . citalopram (CELEXA) 40 MG tablet Take     1 tablet       Daily       for Mood  . cyclobenzaprine (FLEXERIL) 10 MG tablet Take 10 mg by mouth at bedtime. ! Tablet at Bedtime  . diltiazem (CARDIZEM CD) 120 MG 24 hr capsule Take 1 capsule Daily for Heart Rhythm  . Magnesium 500 MG TABS Take by mouth. Takes 2 tablets daily  . Multiple Vitamin (MULTIVITAMIN) tablet Take 1 tablet by mouth daily.  . Omega-3 Fatty Acids (FISH OIL) 1000 MG CAPS Take by mouth daily.  . Probiotic Product (PROBIOTIC DAILY PO) Take 1 tablet by mouth daily.  . rosuvastatin (CRESTOR) 5 MG tablet TAKE 1 TABLET BY MOUTH EVERY DAY FOR CHOLESTEROL  . telmisartan (MICARDIS) 40 MG  tablet Take 1 tablet Daily for BP     Allergies:   Rosuvastatin   Social History   Tobacco Use  . Smoking status: Former Smoker    Quit date: 04/18/2012    Years since quitting: 7.8  . Smokeless tobacco: Former Systems developer  . Tobacco comment: e-cigarettes  Substance Use Topics  . Alcohol use: Not Currently  . Drug use: No     Family Hx: The patient's family history includes Atrial fibrillation in her mother; Barrett's esophagus in her mother; Hypertension in her father and mother; Lung cancer in her father; Mitral valve prolapse in her mother; Thyroid disease in her father. There is no history of Colon cancer.  ROS:   Please see the history of present illness.    Fully vaccinated All other systems reviewed and are negative.   Prior CV studies:   The following studies were reviewed today: Myoview- 09/10/2017-   The left ventricular ejection fraction is normal (55-65%).  Nuclear stress EF: 61%.  There was no ST segment deviation noted during stress.  The study is normal.   1. EF 61%, normal wall motion.  2. No evidence for ischemia or infarction on perfusion images.  3. Frequent PVCs with Lexiscan infusion (symptomatic  Echo 09/09/2017- Study Conclusions   - Left ventricle: The cavity size was normal. There was mild focal  basal hypertrophy of the septum. Systolic function was normal.  The estimated ejection fraction was in the range of 55% to 60%.  Wall motion was normal; there were no regional wall motion  abnormalities. Doppler parameters are consistent with abnormal  left ventricular relaxation (grade 1 diastolic dysfunction).  - Aortic valve: There was trivial regurgitation.  - Aorta: Ascending aortic diameter: 45 mm (S).  - Ascending aorta: The ascending aorta was mildly dilated.  - Mitral valve: There was mild regurgitation.  - Left atrium: The atrium was mildly dilated. Volume/bsa, ES,  (1-plane Simpson&'s, A2C): 39.1 ml/m^2.    Labs/Other Tests and  Data Reviewed:    EKG:  An ECG dated 01/15/2020 was personally reviewed today and demonstrated:  NSR, PVCs, fusion beat, inferior Qs, 1st AVB  Recent Labs: 02/14/2020: ALT 20; BUN 19; Creat 0.81; Hemoglobin 14.7; Magnesium 2.1; Platelets 241; Potassium 4.7; Sodium 139; TSH 1.54   Recent Lipid Panel Lab Results  Component Value Date/Time   CHOL 203 (H) 02/14/2020 03:47 PM   CHOL 171 09/04/2017 09:44 AM   TRIG 116 02/14/2020 03:47 PM   HDL 68 02/14/2020 03:47 PM   HDL 78 09/04/2017 09:44 AM   CHOLHDL 3.0 02/14/2020 03:47 PM   LDLCALC 113 (H) 02/14/2020 03:47 PM    Wt Readings from Last 3  Encounters:  02/16/20 203 lb (92.1 kg)  02/14/20 204 lb (92.5 kg)  10/05/19 205 lb 6.4 oz (93.2 kg)     Risk Assessment/Calculations:      Objective:    Vital Signs:  BP 136/86   Pulse (!) 52   Ht 5' 9.25" (1.759 m)   Wt 203 lb (92.1 kg)   BMI 29.76 kg/m    VITAL SIGNS:  reviewed  ASSESSMENT & PLAN:    Fatigue/DOE- R/O ischemia, r/o cardiomyopathy, r/o arrhythmia  PVCs- Check OP monitor   Ascending Aortic aneurysm- 4.3 cm by CTA 09/02/2019. Scattered coronary ca++ noted  HTN- I suspect her labile readings are secondary to PVCs  HLD- On statin Rx- PCP follows  Plan: Check echo, Myoview, and OP 7 day ZIO- f/u with Dr Gwenlyn Found after the above tests.   Shared Decision Making/Informed Consent The risks [chest pain, shortness of breath, cardiac arrhythmias, dizziness, blood pressure fluctuations, myocardial infarction, stroke/transient ischemic attack, nausea, vomiting, allergic reaction, radiation exposure, metallic taste sensation and life-threatening complications (estimated to be 1 in 10,000)], benefits (risk stratification, diagnosing coronary artery disease, treatment guidance) and alternatives of a nuclear stress test were discussed in detail with Ms. Morro and she agrees to proceed.   COVID-19 Education: The signs and symptoms of COVID-19 were discussed with the patient and  how to seek care for testing (follow up with PCP or arrange E-visit).  The importance of social distancing was discussed today.  Time:   Today, I have spent 20 minutes with the patient with telehealth technology discussing the above problems.     Medication Adjustments/Labs and Tests Ordered: Current medicines are reviewed at length with the patient today.  Concerns regarding medicines are outlined above.   Tests Ordered: No orders of the defined types were placed in this encounter.   Medication Changes: No orders of the defined types were placed in this encounter.   Follow Up:  In Person with Dr Gwenlyn Found in 4-6 weeks  Signed, Kerin Ransom, PA-C  02/16/2020 9:52 AM    [place]

## 2020-02-20 ENCOUNTER — Other Ambulatory Visit: Payer: Self-pay | Admitting: Internal Medicine

## 2020-02-20 ENCOUNTER — Encounter: Payer: Medicare Other | Admitting: Internal Medicine

## 2020-02-26 ENCOUNTER — Other Ambulatory Visit: Payer: Self-pay | Admitting: Internal Medicine

## 2020-03-01 NOTE — Addendum Note (Signed)
Addended by: Vennie Homans on: 03/01/2020 10:15 AM   Modules accepted: Orders

## 2020-03-01 NOTE — Addendum Note (Signed)
Addended by: Erlene Quan on: 03/01/2020 10:26 AM   Modules accepted: Orders

## 2020-03-05 ENCOUNTER — Ambulatory Visit: Payer: Medicare Other | Admitting: Adult Health Nurse Practitioner

## 2020-03-05 ENCOUNTER — Telehealth (HOSPITAL_COMMUNITY): Payer: Self-pay | Admitting: *Deleted

## 2020-03-05 NOTE — Telephone Encounter (Signed)
Patient given detailed instructions per Myocardial Perfusion Study Information Sheet for the test on 03/07/20. Patient notified to arrive 15 minutes early and that it is imperative to arrive on time for appointment to keep from having the test rescheduled.  If you need to cancel or reschedule your appointment, please call the office within 24 hours of your appointment. . Patient verbalized understanding. Kirstie Peri

## 2020-03-06 DIAGNOSIS — R5383 Other fatigue: Secondary | ICD-10-CM | POA: Diagnosis not present

## 2020-03-06 DIAGNOSIS — R0600 Dyspnea, unspecified: Secondary | ICD-10-CM | POA: Diagnosis not present

## 2020-03-06 DIAGNOSIS — I493 Ventricular premature depolarization: Secondary | ICD-10-CM | POA: Diagnosis not present

## 2020-03-07 ENCOUNTER — Other Ambulatory Visit: Payer: Self-pay

## 2020-03-07 ENCOUNTER — Ambulatory Visit (HOSPITAL_COMMUNITY): Payer: Medicare Other | Attending: Cardiology

## 2020-03-07 DIAGNOSIS — E782 Mixed hyperlipidemia: Secondary | ICD-10-CM

## 2020-03-07 DIAGNOSIS — I712 Thoracic aortic aneurysm, without rupture, unspecified: Secondary | ICD-10-CM

## 2020-03-07 DIAGNOSIS — R5383 Other fatigue: Secondary | ICD-10-CM

## 2020-03-07 DIAGNOSIS — I10 Essential (primary) hypertension: Secondary | ICD-10-CM | POA: Diagnosis not present

## 2020-03-07 DIAGNOSIS — R0609 Other forms of dyspnea: Secondary | ICD-10-CM

## 2020-03-07 DIAGNOSIS — R0600 Dyspnea, unspecified: Secondary | ICD-10-CM | POA: Diagnosis not present

## 2020-03-07 DIAGNOSIS — I493 Ventricular premature depolarization: Secondary | ICD-10-CM

## 2020-03-07 LAB — MYOCARDIAL PERFUSION IMAGING
LV dias vol: 112 mL (ref 46–106)
LV sys vol: 46 mL
Peak HR: 92 {beats}/min
Rest HR: 58 {beats}/min
SDS: 3
SRS: 1
SSS: 5
TID: 1.04

## 2020-03-07 MED ORDER — TECHNETIUM TC 99M TETROFOSMIN IV KIT
30.6000 | PACK | Freq: Once | INTRAVENOUS | Status: AC | PRN
  Administered 2020-03-07: 30.6 via INTRAVENOUS
  Filled 2020-03-07: qty 31

## 2020-03-07 MED ORDER — REGADENOSON 0.4 MG/5ML IV SOLN
0.4000 mg | Freq: Once | INTRAVENOUS | Status: AC
  Administered 2020-03-07: 0.4 mg via INTRAVENOUS

## 2020-03-07 MED ORDER — TECHNETIUM TC 99M TETROFOSMIN IV KIT
10.2000 | PACK | Freq: Once | INTRAVENOUS | Status: AC | PRN
  Administered 2020-03-07: 10.2 via INTRAVENOUS
  Filled 2020-03-07: qty 11

## 2020-03-12 ENCOUNTER — Ambulatory Visit (HOSPITAL_COMMUNITY): Payer: Medicare Other | Attending: Cardiology

## 2020-03-12 ENCOUNTER — Other Ambulatory Visit: Payer: Self-pay

## 2020-03-12 DIAGNOSIS — I712 Thoracic aortic aneurysm, without rupture, unspecified: Secondary | ICD-10-CM

## 2020-03-12 DIAGNOSIS — I493 Ventricular premature depolarization: Secondary | ICD-10-CM

## 2020-03-12 DIAGNOSIS — R0600 Dyspnea, unspecified: Secondary | ICD-10-CM | POA: Diagnosis not present

## 2020-03-12 DIAGNOSIS — E782 Mixed hyperlipidemia: Secondary | ICD-10-CM | POA: Diagnosis not present

## 2020-03-12 DIAGNOSIS — I10 Essential (primary) hypertension: Secondary | ICD-10-CM

## 2020-03-12 DIAGNOSIS — R5383 Other fatigue: Secondary | ICD-10-CM | POA: Diagnosis not present

## 2020-03-12 DIAGNOSIS — R0609 Other forms of dyspnea: Secondary | ICD-10-CM

## 2020-03-12 LAB — ECHOCARDIOGRAM COMPLETE
Area-P 1/2: 3.31 cm2
S' Lateral: 2.75 cm

## 2020-03-14 ENCOUNTER — Other Ambulatory Visit: Payer: Self-pay | Admitting: Internal Medicine

## 2020-03-14 DIAGNOSIS — I10 Essential (primary) hypertension: Secondary | ICD-10-CM

## 2020-03-14 DIAGNOSIS — E782 Mixed hyperlipidemia: Secondary | ICD-10-CM

## 2020-03-14 MED ORDER — TELMISARTAN 20 MG PO TABS
ORAL_TABLET | ORAL | 0 refills | Status: DC
Start: 2020-03-14 — End: 2020-06-09

## 2020-03-14 MED ORDER — BISOPROLOL-HYDROCHLOROTHIAZIDE 2.5-6.25 MG PO TABS: ORAL_TABLET | ORAL | 0 refills | Status: DC

## 2020-03-20 ENCOUNTER — Other Ambulatory Visit: Payer: Self-pay | Admitting: Adult Health Nurse Practitioner

## 2020-03-20 ENCOUNTER — Telehealth: Payer: Self-pay | Admitting: Cardiology

## 2020-03-20 DIAGNOSIS — F411 Generalized anxiety disorder: Secondary | ICD-10-CM

## 2020-03-20 NOTE — Telephone Encounter (Signed)
I called the patient to review her monitor results.  She has stopped her diltiazem as I suggested.  Her primary care provider now has her on bisoprolol 2.5 with HCTZ 6.25 mg twice daily and telmisartan 10 mg every afternoon.  She says her pulse is irregular at times but is not particularly slow and she has no sustained tachycardia.  She will keep her follow-up with Dr. Gwenlyn Found as scheduled.  Kerin Ransom PA-C 03/20/2020 8:58 AM

## 2020-03-29 DIAGNOSIS — R001 Bradycardia, unspecified: Secondary | ICD-10-CM

## 2020-03-29 NOTE — Telephone Encounter (Signed)
Per pt has been feeling poorly for a couple of months Pt recently worked up had myoview, echo and monitor Will forward to Dr Gwenlyn Found for review .Adonis Housekeeper

## 2020-04-02 ENCOUNTER — Telehealth: Payer: Self-pay | Admitting: Cardiovascular Disease

## 2020-04-02 NOTE — Telephone Encounter (Signed)
New message   Pt would like a call from RN. She would like to discuss what kinds of activity she can do with her bradycardia. She also wants to know if she can cancel the appt on 3/30 with Dr. Gwenlyn Found because she will be seeing EP that day. She said she just saw Kerin Ransom and her meds were adjusted.

## 2020-04-02 NOTE — Telephone Encounter (Signed)
Returned call to patient who states that she made her appointment with EP on 3/30 at 9 am. Patient inquiring if she should keep appointment with Dr. Gwenlyn Found on 3/30 since she is seeing EP that day. Patient also wanted to check with Dr. Gwenlyn Found and see what she can do activity wise given her bradycardia. Patient reports she went to the grocery story yesterday and had to sit down due to feeling slightly dizzy, short of breath, and exhausted. Patient states that she has had no chest pain, lightheadedness, or syncope. Patient states that today her blood pressure was 127/73 with HR reading low on monitor, patient rechecked it and it was 131/76 with HR 46. These readings were taken 1 hour after taking Bisoprolol.   Advised patient I would forward message to Dr. Gwenlyn Found for him to review and advise.   Made patient aware of ED precautions should symptoms worsen or new symptoms develop. Patient verbalized understanding.

## 2020-04-02 NOTE — Telephone Encounter (Signed)
OK to cancel appt with me 0n 3/30 since she has an appt with EP that day. She can address her questions to them on that day and see me back in 3 months

## 2020-04-03 NOTE — Telephone Encounter (Signed)
Spoke with pt on the phone. Relayed Dr. Kennon Holter message to the pt and re-made her appointment for a 3 month follow up. Pt scheduled for June 22nd at 11:30am. Pt verbalizes understanding.

## 2020-04-04 ENCOUNTER — Encounter: Payer: Self-pay | Admitting: Internal Medicine

## 2020-04-04 ENCOUNTER — Ambulatory Visit (INDEPENDENT_AMBULATORY_CARE_PROVIDER_SITE_OTHER): Payer: Medicare Other | Admitting: Internal Medicine

## 2020-04-04 ENCOUNTER — Other Ambulatory Visit: Payer: Self-pay

## 2020-04-04 VITALS — BP 112/78 | HR 66 | Ht 69.0 in | Wt 203.4 lb

## 2020-04-04 DIAGNOSIS — E782 Mixed hyperlipidemia: Secondary | ICD-10-CM | POA: Diagnosis not present

## 2020-04-04 DIAGNOSIS — I10 Essential (primary) hypertension: Secondary | ICD-10-CM | POA: Diagnosis not present

## 2020-04-04 DIAGNOSIS — R5383 Other fatigue: Secondary | ICD-10-CM | POA: Diagnosis not present

## 2020-04-04 DIAGNOSIS — R001 Bradycardia, unspecified: Secondary | ICD-10-CM

## 2020-04-04 DIAGNOSIS — I493 Ventricular premature depolarization: Secondary | ICD-10-CM | POA: Diagnosis not present

## 2020-04-04 NOTE — Patient Instructions (Addendum)
Medication Instructions:  Stop your Bisoprolol- HCTZ (Ziac) Stop your Crestor  Your physician recommends that you continue on your current medications as directed. Please refer to the Current Medication list given to you today.  Labwork: None ordered.  Testing/Procedures: None ordered.  Follow-Up: Your physician wants you to follow-up in: Follow up as scheduled with Dr. Gwenlyn Found.    Any Other Special Instructions Will Be Listed Below (If Applicable).  If you need a refill on your cardiac medications before your next appointment, please call your pharmacy.

## 2020-04-04 NOTE — Progress Notes (Signed)
Electrophysiology Office Note   Date:  04/04/2020   ID:  Taylor Li, DOB [birth year], MRN [id number]  PCP:  Unk Pinto, MD  Cardiologist:  Dr Gwenlyn Found Primary Electrophysiologist: Thompson Grayer, MD    CC: fatigue   History of Present Illness: Taylor Li is a 70 y.o. female who presents today for electrophysiology evaluation.   The patient is referred for EP consultation regarding her recent event monitor which showed PVCs and second degree AV block.   She has multiple complaints today.  She feels intolerant of statins and feels that some of her fatigue and achyness are due to crestor.  She also has had difficulty with multiple antihypertensive medicines.  She has occasional fatigue.  She is reasonably active.  She says she has a h/o afib, though no recent events.  Today, she denies symptoms of palpitations, chest pain, shortness of breath, orthopnea, PND, lower extremity edema, claudication, dizziness, presyncope, syncope, bleeding, or neurologic sequela. The patient is tolerating medications without difficulties and is otherwise without complaint today.    Past Medical History:  Diagnosis Date  . A-fib ([place])   . Arthritis   . C. difficile diarrhea   . Hyperlipidemia   . Hypertension   . Personal history of colonic polyp- adenoma 09/22/2013  . Pre-diabetes   . Vitamin D deficiency    Past Surgical History:  Procedure Laterality Date  . APPENDECTOMY    . BREAST SURGERY Bilateral 1983   SQ mastectomies  . CARDIOVERSION    . CHOLECYSTECTOMY    . COLONOSCOPY    . LAMINECTOMY  1982   L4-L5  . NASAL SEPTUM SURGERY    . TONSILLECTOMY    . VAGINAL HYSTERECTOMY  1985     Current Outpatient Medications  Medication Sig Dispense Refill  . tetanus immune globulin (HYPERTET) 250 UNIT/ML injection Inject into the muscle once.    . ALPRAZolam (XANAX) 1 MG tablet TAKE 1/2-1 TAB 2-3X/DAY ONLY IF NEEDED FOR ANXIETY ATTACK & LIMIT TO 5 DAYS/WEEK TO AVOID ADDICTION & DEMENTIA  60 tablet 0  . Ascorbic Acid (VITAMIN C) 1000 MG tablet Take 1,000 mg by mouth daily.    Marland Kitchen aspirin 81 MG tablet Take 81 mg by mouth daily.    Marland Kitchen BLACK PEPPER-TURMERIC PO Take 1,000 mg by mouth daily.     Marland Kitchen buPROPion (WELLBUTRIN XL) 300 MG 24 hr tablet TAKE 1 TABLET DAILY FOR MOOD, FOCUS & CONCENTRATION 90 tablet 3  . Cholecalciferol (VITAMIN D PO) Take 5,000 Units by mouth 2 (two) times daily.     . citalopram (CELEXA) 40 MG tablet TAKE 1 TABLET BY MOUTH DAILY FOR MOOD 90 tablet 0  . cyclobenzaprine (FLEXERIL) 10 MG tablet TAKE 1/2 TO 1 TABLET 2 TO 3 X /DAY IF NEEDED FOR MUSCLE SPASM 90 tablet 0  . Magnesium 500 MG TABS Take by mouth. Takes 2 tablets daily    . Multiple Vitamin (MULTIVITAMIN) tablet Take 1 tablet by mouth daily.    . Omega-3 Fatty Acids (FISH OIL) 1000 MG CAPS Take by mouth daily.    . Probiotic Product (PROBIOTIC DAILY PO) Take 1 tablet by mouth daily.    Marland Kitchen telmisartan (MICARDIS) 20 MG tablet Take  1/2 to 1  tablet  Daily  for BP 90 tablet 0   No current facility-administered medications for this visit.    Allergies:   Rosuvastatin   Social History:  The patient  reports that she quit smoking about 7 years ago. She has quit using smokeless  tobacco. She reports previous alcohol use. She reports that she does not use drugs.   Family History:  The patient's  family history includes Atrial fibrillation in her mother; Barrett's esophagus in her mother; Hypertension in her father and mother; Lung cancer in her father; Mitral valve prolapse in her mother; Thyroid disease in her father.    ROS:  Please see the history of present illness.   All other systems are personally reviewed and negative.    PHYSICAL EXAM: VS:  BP 112/78   Pulse 66   Ht 5\' 9"  (1.753 m)   Wt 203 lb 6.4 oz (92.3 kg)   SpO2 95%   BMI 30.04 kg/m  , BMI Body mass index is 30.04 kg/m. GEN: Well nourished, well developed, in no acute distress HEENT: normal Neck: no JVD  Cardiac: RRR  Respiratory:  normal work of breathing GI: soft  MS: no deformity or atrophy Skin: warm and dry  Neuro:  Strength and sensation are intact Psych: euthymic mood, full affect  EKG:  EKG is ordered today. The ekg ordered today is personally reviewed and shows sinus rhythm 66 bpm, PR 272 msec, QRS 98 msec, QTc 452 msec   Recent Labs: 02/14/2020: ALT 20; BUN 19; Creat 0.81; Hemoglobin 14.7; Magnesium 2.1; Platelets 241; Potassium 4.7; Sodium 139; TSH 1.54  personally reviewed   Lipid Panel     Component Value Date/Time   CHOL 203 (H) 02/14/2020 1547   CHOL 171 09/04/2017 0944   TRIG 116 02/14/2020 1547   HDL 68 02/14/2020 1547   HDL 78 09/04/2017 0944   CHOLHDL 3.0 02/14/2020 1547   VLDL 23 05/15/2016 1553   LDLCALC 113 (H) 02/14/2020 1547   personally reviewed   Wt Readings from Last 3 Encounters:  04/04/20 203 lb 6.4 oz (92.3 kg)  03/07/20 203 lb (92.1 kg)  02/16/20 203 lb (92.1 kg)      Other studies personally reviewed: Additional studies/ records that were reviewed today include: prior office notes, echo, event monitor, ecgs  Review of the above records today demonstrates: as above   ASSESSMENT AND PLAN:  1.  PVCs/ NSVT Minimally symptomatic Given first degree AV block and mobitz I second degree AV block on monitor, will stop beta blocker today.  2. 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 HTN Well controlled Given intolerance to medicines, would allow a little higher BP and less aggressive medical therapy. Stop bystolic/ hctz today No other changes  4. HL Intolerant of statins Stop crestor Could consider repatha  5. ? H/o afib No recent events No AF by recent monitor Consider Gardner if her afib burden increases   Risks, benefits and potential toxicities for medications prescribed and/or refilled reviewed with patient today.    Follow-up:  With Dr Gwenlyn Found as scheduled I will see as  needed  Signed, Thompson Grayer, MD  04/04/2020 4:52 PM     [place] [address] [phone] (office) [phone] (fax)

## 2020-04-11 ENCOUNTER — Institutional Professional Consult (permissible substitution): Payer: Medicare Other | Admitting: Internal Medicine

## 2020-04-11 ENCOUNTER — Ambulatory Visit: Payer: Medicare Other | Admitting: Cardiovascular Disease

## 2020-04-12 NOTE — Telephone Encounter (Signed)
Patients BP and HR 120/73 50, 121/85 54 for yesterday and today. Noted no tachycardia and BP is still okay after stopping medication. Doesn't seem to be medication related. Talked to patient about her fatigue which she states has been going on for a few months and was hoping that after stopping the medications that she would start to feel better but feels the same. Discussed decrease in activity for a few months and the difference that can make in her stamina. Advised to start making a point to go on two short walks am and pm and increase distance as she can. Advised to contact PCP office and I would see if Dr. Rayann Heman has any further advisement. I will call her IF Dr. Rayann Heman has anything else to add.     She verbalized understanding.

## 2020-04-19 NOTE — Telephone Encounter (Signed)
Left message to call back  

## 2020-05-06 ENCOUNTER — Other Ambulatory Visit: Payer: Self-pay | Admitting: Internal Medicine

## 2020-05-08 ENCOUNTER — Other Ambulatory Visit: Payer: Self-pay | Admitting: Adult Health

## 2020-05-14 ENCOUNTER — Other Ambulatory Visit: Payer: Self-pay | Admitting: Adult Health

## 2020-05-14 DIAGNOSIS — F411 Generalized anxiety disorder: Secondary | ICD-10-CM

## 2020-05-15 DIAGNOSIS — Z23 Encounter for immunization: Secondary | ICD-10-CM | POA: Diagnosis not present

## 2020-05-27 NOTE — Progress Notes (Addendum)
RESCHEDULED

## 2020-05-28 ENCOUNTER — Ambulatory Visit: Payer: Medicare Other | Admitting: Internal Medicine

## 2020-05-28 ENCOUNTER — Encounter: Payer: Self-pay | Admitting: Internal Medicine

## 2020-05-28 DIAGNOSIS — Z87891 Personal history of nicotine dependence: Secondary | ICD-10-CM

## 2020-05-28 DIAGNOSIS — I712 Thoracic aortic aneurysm, without rupture: Secondary | ICD-10-CM

## 2020-05-28 DIAGNOSIS — Z79899 Other long term (current) drug therapy: Secondary | ICD-10-CM

## 2020-05-28 DIAGNOSIS — Z1211 Encounter for screening for malignant neoplasm of colon: Secondary | ICD-10-CM

## 2020-05-28 DIAGNOSIS — R7309 Other abnormal glucose: Secondary | ICD-10-CM

## 2020-05-28 DIAGNOSIS — E782 Mixed hyperlipidemia: Secondary | ICD-10-CM

## 2020-05-28 DIAGNOSIS — I480 Paroxysmal atrial fibrillation: Secondary | ICD-10-CM

## 2020-05-28 DIAGNOSIS — R7303 Prediabetes: Secondary | ICD-10-CM

## 2020-05-28 DIAGNOSIS — E559 Vitamin D deficiency, unspecified: Secondary | ICD-10-CM

## 2020-05-28 DIAGNOSIS — Z136 Encounter for screening for cardiovascular disorders: Secondary | ICD-10-CM

## 2020-05-28 DIAGNOSIS — Z8249 Family history of ischemic heart disease and other diseases of the circulatory system: Secondary | ICD-10-CM

## 2020-05-28 DIAGNOSIS — I10 Essential (primary) hypertension: Secondary | ICD-10-CM

## 2020-05-30 ENCOUNTER — Encounter: Payer: Self-pay | Admitting: Adult Health

## 2020-05-30 ENCOUNTER — Other Ambulatory Visit: Payer: Self-pay

## 2020-05-30 ENCOUNTER — Ambulatory Visit (INDEPENDENT_AMBULATORY_CARE_PROVIDER_SITE_OTHER): Payer: Medicare Other | Admitting: Adult Health

## 2020-05-30 VITALS — BP 100/60 | HR 48 | Temp 97.1°F | Wt 199.0 lb

## 2020-05-30 DIAGNOSIS — I440 Atrioventricular block, first degree: Secondary | ICD-10-CM

## 2020-05-30 DIAGNOSIS — R5383 Other fatigue: Secondary | ICD-10-CM

## 2020-05-30 DIAGNOSIS — I493 Ventricular premature depolarization: Secondary | ICD-10-CM

## 2020-05-30 DIAGNOSIS — R001 Bradycardia, unspecified: Secondary | ICD-10-CM | POA: Diagnosis not present

## 2020-05-30 DIAGNOSIS — I10 Essential (primary) hypertension: Secondary | ICD-10-CM

## 2020-05-30 NOTE — Progress Notes (Signed)
Assessment and Plan:  Taylor Li was seen today for bradycardia.  Diagnoses and all orders for this visit:  Bradycardia Fatigue, unspecified type First degree AV block PVC's (premature ventricular contractions) Initially quite bradycardic but improved, EKG shows first degree AVB, NSCPT; she is able to ambulate without dyspnea, denies dizziness Fatigue of unclear etiology, may have intermittent brady rhythm She is off all rate controlling agents Reviewed recent ECHO and cardiology notes with her in detail Discussed alternative etiologies for fatigue; discussed labs such as CBC, TSH, B12 however denies other concerning sx today, she declines labs at this time. Encouraged to continue to monitor, notably if unable to exert consistent with baseline, CP, dyspnea with exertion, dizziness with position changes contact office.   Hypertension Mildly overtreated today - recommend she continue to monitor closely, if having BPs <110/70 reduce telmisartan to 1/2 tab, take whole tab if 140/80+ Continue DASH diet, push fluids   Further disposition pending results of labs. Discussed med's effects and SE's.   Over 30 minutes of exam, counseling, chart review, and critical decision making was performed.   Future Appointments  Date Time Provider [place]  07/04/2020 11:30 AM [name], MD [facility]  07/30/2020  2:00 PM Unk Pinto, MD [facility] None    ------------------------------------------------------------------------------------------------------------------   HPI BP 100/60   Pulse (!) 33   Temp (!) 97.1 F (36.2 C)   Wt 199 lb (90.3 kg)   SpO2 94%   BMI 29.39 kg/m   70 y.o.female with hx of ? A. Fib, 1st degree AVB, frequent PVCs presents for concerns with pulse and recent fatigue. Was recently evaluated by Dr. Gwenlyn Found Dr. Rayann Heman due to fatigue, event monitor showed PVCs and 1st degree AV block. ECHO 03/12/2020 showed some LVH but otherwise fairly unremarkable  with EF 60-65%. BB was d/c'd with perceived improvement, was not recommended pacing at that time. She has follow up planned 07/04/2020.   She reports work up very fatigued 3 days ago Sunday, noted pulse was slower, 40-50s, BP was lower as well, reports had some discomfort in her chest, felt light headed. This resolved, but persistent fatigue. She denies dyspnea, CP, has been able to climb stairs and ambulate normally without dyspnea, denies dizziness. She has not attempted to work out this week.   Her blood pressure has been controlled at home (review shows mostly 130s/70s, but reports this has been running lower this week), today their BP is BP: 100/60. She has been taking She denies chest pain, shortness of breath, dizziness, edema, PND. She reports is able to ambulate at normal pace without difficulty, no orthopenia.   CBC Latest Ref Rng & Units 02/14/2020 10/05/2019 07/04/2019  WBC 3.8 - 10.8 Thousand/uL 7.8 6.9 7.0  Hemoglobin 11.7 - 15.5 g/dL 14.7 14.4 14.4  Hematocrit 35.0 - 45.0 % 42.4 42.6 43.6  Platelets 140 - 400 Thousand/uL 241 270 296   Lab Results  Component Value Date   TSH 1.54 02/14/2020     Past Medical History:  Diagnosis Date  . A-fib ([place])   . Arthritis   . C. difficile diarrhea   . Hyperlipidemia   . Hypertension   . Personal history of colonic polyp- adenoma 09/22/2013  . Pre-diabetes   . Vitamin D deficiency      Allergies  Allergen Reactions  . Rosuvastatin     myalgias    Current Outpatient Medications on File Prior to Visit  Medication Sig  . ALPRAZolam (XANAX) 1 MG tablet TAKE 1/2-1 TAB 2-3X  DAILY ONLY IF NEEDED FOR ANXIETY ATTACK & LIMIT TO 5 DAYS/WEEK TO AVOID ADDICTION & DEMENTIA  . Ascorbic Acid (VITAMIN C) 1000 MG tablet Take 1,000 mg by mouth daily.  Marland Kitchen aspirin 81 MG tablet Take 81 mg by mouth daily.  Marland Kitchen BLACK PEPPER-TURMERIC PO Take 1,000 mg by mouth daily.   Marland Kitchen buPROPion (WELLBUTRIN XL) 300 MG 24 hr tablet TAKE 1 TABLET DAILY FOR MOOD, FOCUS &  CONCENTRATION  . Cholecalciferol (VITAMIN D PO) Take 5,000 Units by mouth 2 (two) times daily.   . citalopram (CELEXA) 40 MG tablet TAKE 1 TABLET BY MOUTH DAILY FOR MOOD  . cyclobenzaprine (FLEXERIL) 10 MG tablet Take  1/2 to 1 tablet   2 to 3 x /day  as needed for Muscle Spasm  . Magnesium 500 MG TABS Take by mouth. Takes 2 tablets daily  . Multiple Vitamin (MULTIVITAMIN) tablet Take 1 tablet by mouth daily.  . Omega-3 Fatty Acids (FISH OIL) 1000 MG CAPS Take by mouth daily.  . Probiotic Product (PROBIOTIC DAILY PO) Take 1 tablet by mouth daily.  Marland Kitchen telmisartan (MICARDIS) 20 MG tablet Take  1/2 to 1  tablet  Daily  for BP  . tetanus immune globulin (HYPERTET) 250 UNIT/ML injection Inject into the muscle once.   No current facility-administered medications on file prior to visit.    ROS: Review of Systems  Constitutional: Positive for malaise/fatigue. Negative for chills, diaphoresis, fever and weight loss.  HENT: Negative.   Eyes: Negative for blurred vision.  Respiratory: Negative for cough, shortness of breath and wheezing.   Cardiovascular: Negative for chest pain, palpitations, orthopnea, leg swelling and PND.  Gastrointestinal: Negative for blood in stool, diarrhea, melena and nausea.  Musculoskeletal: Negative for falls.  Skin: Negative for rash.  Neurological: Negative for dizziness, weakness and headaches.  Endo/Heme/Allergies: Negative for polydipsia. Does not bruise/bleed easily.  Psychiatric/Behavioral: Negative for substance abuse.     Physical Exam:  BP 100/60   Pulse (!) 33   Temp (!) 97.1 F (36.2 C)   Wt 199 lb (90.3 kg)   SpO2 94%   BMI 29.39 kg/m   General Appearance: Well nourished, well dressed adult elder female in no apparent distress. Eyes: PERRLA, conjunctiva no swelling or erythema ENT/Mouth: Ext aud canals clear, TMs without erythema, bulging. No erythema, swelling, or exudate on post pharynx.  Tonsils not swollen or erythematous. Hearing normal.   Neck: Supple, thyroid normal.  Respiratory: Respiratory effort normal, BS equal bilaterally without rales, rhonchi, wheezing or stridor.  Cardio: Initially regularly irregular with pauses, bradycardic; with no MRGs. Brisk peripheral pulses without edema.  Abdomen: Soft, + BS.  Non tender, no guarding, rebound, hernias, masses. Lymphatics: Non tender without lymphadenopathy.  Musculoskeletal: No obvious deformity, 5/5 strength, normal gait.  Skin: Warm, dry without rashes, lesions, ecchymosis.  Neuro: Normal muscle tone  Psych: Awake and oriented X 3, normal affect, Insight and Judgment appropriate.     Izora Ribas, NP 2:29 PM [facility]

## 2020-06-01 NOTE — Addendum Note (Signed)
Addended by: Liane Comber C on: 06/01/2020 01:00 PM   Modules accepted: Orders

## 2020-06-07 ENCOUNTER — Other Ambulatory Visit: Payer: Self-pay | Admitting: Adult Health Nurse Practitioner

## 2020-06-09 ENCOUNTER — Other Ambulatory Visit: Payer: Self-pay | Admitting: Internal Medicine

## 2020-06-09 DIAGNOSIS — I10 Essential (primary) hypertension: Secondary | ICD-10-CM

## 2020-06-14 ENCOUNTER — Ambulatory Visit: Payer: Medicare Other | Admitting: Adult Health

## 2020-07-04 ENCOUNTER — Ambulatory Visit: Payer: Medicare Other | Admitting: Cardiovascular Disease

## 2020-07-25 ENCOUNTER — Other Ambulatory Visit: Payer: Self-pay | Admitting: Adult Health

## 2020-07-25 DIAGNOSIS — F411 Generalized anxiety disorder: Secondary | ICD-10-CM

## 2020-07-30 ENCOUNTER — Ambulatory Visit (INDEPENDENT_AMBULATORY_CARE_PROVIDER_SITE_OTHER): Payer: Medicare Other | Admitting: Internal Medicine

## 2020-07-30 ENCOUNTER — Other Ambulatory Visit: Payer: Self-pay

## 2020-07-30 ENCOUNTER — Encounter: Payer: Self-pay | Admitting: Internal Medicine

## 2020-07-30 VITALS — BP 112/84 | HR 78 | Temp 97.1°F | Resp 16 | Ht 68.0 in | Wt 190.2 lb

## 2020-07-30 DIAGNOSIS — R7309 Other abnormal glucose: Secondary | ICD-10-CM

## 2020-07-30 DIAGNOSIS — Z1211 Encounter for screening for malignant neoplasm of colon: Secondary | ICD-10-CM

## 2020-07-30 DIAGNOSIS — I10 Essential (primary) hypertension: Secondary | ICD-10-CM

## 2020-07-30 DIAGNOSIS — E559 Vitamin D deficiency, unspecified: Secondary | ICD-10-CM

## 2020-07-30 DIAGNOSIS — I493 Ventricular premature depolarization: Secondary | ICD-10-CM | POA: Diagnosis not present

## 2020-07-30 DIAGNOSIS — Z1212 Encounter for screening for malignant neoplasm of rectum: Secondary | ICD-10-CM

## 2020-07-30 DIAGNOSIS — R5383 Other fatigue: Secondary | ICD-10-CM

## 2020-07-30 DIAGNOSIS — Z87891 Personal history of nicotine dependence: Secondary | ICD-10-CM

## 2020-07-30 DIAGNOSIS — E782 Mixed hyperlipidemia: Secondary | ICD-10-CM | POA: Diagnosis not present

## 2020-07-30 DIAGNOSIS — Z79899 Other long term (current) drug therapy: Secondary | ICD-10-CM

## 2020-07-30 DIAGNOSIS — I712 Thoracic aortic aneurysm, without rupture, unspecified: Secondary | ICD-10-CM

## 2020-07-30 DIAGNOSIS — I480 Paroxysmal atrial fibrillation: Secondary | ICD-10-CM | POA: Diagnosis not present

## 2020-07-30 DIAGNOSIS — Z136 Encounter for screening for cardiovascular disorders: Secondary | ICD-10-CM

## 2020-07-30 DIAGNOSIS — Z8249 Family history of ischemic heart disease and other diseases of the circulatory system: Secondary | ICD-10-CM

## 2020-07-30 NOTE — Patient Instructions (Signed)

## 2020-07-30 NOTE — Progress Notes (Signed)
Annual Screening/Preventative Visit & Comprehensive Evaluation &  Examination  Future Appointments  Date Time Provider [place]  07/30/2020  2:00 PM Unk Pinto, MD [facility] None  09/14/2020 11:15 AM [name], MD [facility]  07/30/2021  2:00 PM Unk Pinto, MD [facility] None        This very nice [name] presents for a Screening /Preventative Visit & comprehensive evaluation and management of multiple medical co-morbidities.  Patient has been followed for HTN, HLD, Prediabetes  and Vitamin D Deficiency.        HTN predates since 2008.  Patient has remote hx /o CV x 2 for pAfib in 2002 & 2004. Currently she's followed by Dr Gwenlyn Found with a negative Stress Myoview in 2009.  Patient also is followed by Dr Roxan Hockey for a for a dilated Ascending Thoracic Aorta (43 mm).  Patient's BP has been controlled at home and patient denies any cardiac symptoms as chest pain, palpitations, shortness of breath, dizziness or ankle swelling. Today's   atient's BP has been controlled at home and patient denies any cardiac symptoms as chest pain, palpitations, shortness of breath, dizziness or ankle swelling. Today's          Patient's hyperlipidemia is controlled with diet and medications. Patient denies myalgias or other medication SE's. Last lipids were not at goal:  Lab Results  Component Value Date   CHOL 203 (H) 02/14/2020   HDL 68 02/14/2020   LDLCALC 113 (H) 02/14/2020   TRIG 116 02/14/2020   CHOLHDL 3.0 02/14/2020         Patient has hx/o prediabetes predating A1c 5.8% /2011 &  5.9% /2011) and patient denies reactive hypoglycemic symptoms, visual blurring, diabetic polys or paresthesias. Last A1c was   Lab Results  Component Value Date   HGBA1C 5.7 (H) 02/14/2020         Finally, patient has history of Vitamin D Deficiency ("36 " /2010) and last Vitamin D was still  low (goal 70-100):  Lab Results  Component Value Date   VD25OH 46 02/14/2020      Current Outpatient Medications on File Prior to Visit  Medication Sig   ALPRAZolam (XANAX) 1 MG tablet TAKE 1/2-1 TAB 2-3X DAILY ONLY IF NEEDED                                                                                    VITAMIN C 1000 MG tablet Take 1,000 mg daily.   aspirin 81 MG tablet Take  daily.   BLACK PEPPER-TURMERIC PO Take 1,000 mg by mouth daily.    buPROPion (WELLBUTRIN XL) 300 MG 24 hr tablet TAKE 1 TABLET DAILY FOR MOOD, FOCUS & CONCENTRATION   Cholecalciferol (VITAMIN D PO) Take 5,000 Units by mouth 2 (two) times daily.    citalopram (CELEXA) 40 MG tablet TAKE 1 TABLET BY MOUTH DAILY FOR MOOD   cyclobenzaprine (FLEXERIL) 10 MG tablet Take  1/2 to 1 tablet   2 to 3 x /day  as needed for Muscle Spasm   Magnesium 500 MG TABS Take by mouth. Takes 2 tablets daily   Multiple Vitamin (MULTIVITAMIN) tablet Take 1 tablet by mouth daily.  Omega-3 Fatty Acids (FISH OIL) 1000 MG CAPS Take by mouth daily.   Probiotic Product (PROBIOTIC DAILY PO) Take 1 tablet by mouth daily.   telmisartan (MICARDIS) 20 MG tablet Take  1 tablet  Daily  for BP    Allergies  Allergen Reactions   Rosuvastatin     myalgias     Past Medical History:  Diagnosis Date   A-fib ([place])    Arthritis    C. difficile diarrhea    Hyperlipidemia    Hypertension    Personal history of colonic polyp- adenoma 09/22/2013   Pre-diabetes    Vitamin D deficiency      Health Maintenance  Topic Date Due   Zoster Vaccines- Shingrix (1 of 2) Never done   MAMMOGRAM  04/16/2015   INFLUENZA VACCINE  08/13/2020   COVID-19 Vaccine (5 - Booster for Pfizer series) 09/15/2020   TETANUS/TDAP  10/17/2025   DEXA SCAN  Completed   Hepatitis C Screening  Completed   PNA vac Low Risk Adult  Completed   HPV VACCINES  Aged Out     Immunization History  Administered Date(s) Administered   DTaP 01/13/2001   Influenza Inj Mdck Quad With Preservative 09/30/2016   Influenza Whole 10/06/2012   Influenza, High  Dose Seasonal PF 10/15/2017, 10/24/2019   Influenza, Quadrivalent, Recombinant, Inj, Pf 10/15/2018   Influenza,inj,quad, With Preservative 10/18/2015   Influenza-Unspecified 11/11/2013, 11/15/2014   PFIZER Comirnaty(Gray Top)Covid-19 Tri-Sucrose Vaccine 05/15/2020   PFIZER(Purple Top)SARS-COV-2 Vaccination 02/25/2019, 03/22/2019, 10/24/2019   PPD Test 01/14/2013, 04/17/2014, 04/16/2015, 05/15/2016   Pneumococcal Conjugate-13 06/15/2017   Pneumococcal Polysaccharide-23 01/13/2001, 02/01/2019   Tdap 10/18/2015   Zoster, Live 11/19/2009     Last Colon - 09/16/2013 - Dr Carlean Purl - polyps - Recommended repeat Colon in 2020 and rescheduled to 2022 - later changed to 10 yr f/u - due 2025.    Last MGM - 04/15/2013 [employer] - Implants - Recc 1 yr f/u.   Past Surgical History:  Procedure Laterality Date   APPENDECTOMY     BREAST SURGERY Bilateral 1983   SQ mastectomies   CARDIOVERSION     CHOLECYSTECTOMY     COLONOSCOPY     LAMINECTOMY  1982   L4-L5   NASAL SEPTUM SURGERY     TONSILLECTOMY     VAGINAL HYSTERECTOMY  1985    Social History   Tobacco Use   Smoking status: Former    Types: Cigarettes    Quit date: 04/18/2012    Years since quitting: 8.2   Smokeless tobacco: Former   Tobacco comments:    e-cigarettes  Substance Use Topics   Alcohol use: Not Currently   Drug use: No      ROS Constitutional: Denies fever, chills, weight loss/gain, headaches, insomnia,  night sweats, and change in appetite. Does c/o fatigue. Eyes: Denies redness, blurred vision, diplopia, discharge, itchy, watery eyes.  ENT: Denies discharge, congestion, post nasal drip, epistaxis, sore throat, earache, hearing loss, dental pain, Tinnitus, Vertigo, Sinus pain, snoring.  Cardio: Denies chest pain, palpitations, irregular heartbeat, syncope, dyspnea, diaphoresis, orthopnea, PND, claudication, edema Respiratory: denies cough, dyspnea, DOE, pleurisy, hoarseness, laryngitis, wheezing.  Gastrointestinal:  Denies dysphagia, heartburn, reflux, water brash, pain, cramps, nausea, vomiting, bloating, diarrhea, constipation, hematemesis, melena, hematochezia, jaundice, hemorrhoids Genitourinary: Denies dysuria, frequency, urgency, nocturia, hesitancy, discharge, hematuria, flank pain Breast: Breast lumps, nipple discharge, bleeding.  Musculoskeletal: Denies arthralgia, myalgia, stiffness, Jt. Swelling, pain, limp, and strain/sprain. Denies falls. Skin: Denies puritis, rash, hives, warts, acne, eczema, changing in skin  lesion Neuro: No weakness, tremor, incoordination, spasms, paresthesia, pain Psychiatric: Denies confusion, memory loss, sensory loss. Denies Depression. Endocrine: Denies change in weight, skin, hair change, nocturia, and paresthesia, diabetic polys, visual blurring, hyper / hypo glycemic episodes.  Heme/Lymph: No excessive bleeding, bruising, enlarged lymph nodes.  Physical Exam  There were no vitals taken for this visit.  General Appearance: Well nourished, well groomed and in no apparent distress.  Eyes: PERRLA, EOMs, conjunctiva no swelling or erythema, normal fundi and vessels. Sinuses: No frontal/maxillary tenderness ENT/Mouth: EACs patent / TMs  nl. Nares clear without erythema, swelling, mucoid exudates. Oral hygiene is good. No erythema, swelling, or exudate. Tongue normal, non-obstructing. Tonsils not swollen or erythematous. Hearing normal.  Neck: Supple, thyroid not palpable. No bruits, nodes or JVD. Respiratory: Respiratory effort normal.  BS equal and clear bilateral without rales, rhonci, wheezing or stridor. Cardio: Heart sounds are normal with regular rate and rhythm and no murmurs, rubs or gallops. Peripheral pulses are normal and equal bilaterally without edema. No aortic or femoral bruits. Chest: symmetric with normal excursions and percussion. Breasts: Symmetric, without lumps, nipple discharge, retractions, or fibrocystic changes.  Abdomen: Flat, soft with bowel  sounds active. Nontender, no guarding, rebound, hernias, masses, or organomegaly.  Lymphatics: Non tender without lymphadenopathy.  Genitourinary:  Musculoskeletal: Full ROM all peripheral extremities, joint stability, 5/5 strength, and normal gait. Skin: Warm and dry without rashes, lesions, cyanosis, clubbing or  ecchymosis.  Neuro: Cranial nerves intact, reflexes equal bilaterally. Normal muscle tone, no cerebellar symptoms. Sensation intact.  Pysch: Alert and oriented X 3, normal affect, Insight and Judgment appropriate.    Assessment and Plan  1. Annual Preventative Screening Examination  1. Essential hypertension  - EKG 12-Lead - Urinalysis, Routine w reflex microscopic - Microalbumin / creatinine urine ratio - CBC with Differential/Platelet - COMPLETE METABOLIC PANEL WITH GFR - Magnesium - TSH  2. Hyperlipidemia, mixed  - EKG 12-Lead - Lipid panel - TSH  3. Abnormal glucose  - EKG 12-Lead - Hemoglobin A1c - Insulin, random  4. Vitamin D deficiency  - EKG 12-Lead - VITAMIN D 25 Hydroxy (Vit-D Deficiency, Fractures)  5. Thoracic aortic aneurysm without rupture (HCC) - EKG 12-Lead - Lipid panel  6. Paroxysmal atrial fibrillation (HCC)  - EKG 12-Lead  7. Screening for colorectal cancer  - POC Hemoccult Bld/Stl  8. Screen Ischemic Heart Disease - EKG 12-Lead  9. FHx: hypertension  - EKG 12-Lead  10. Former smoker  - EKG 12-Lead  11. Fatigue   12. Medication management  - Urinalysis, Routine w reflex microscopic - Microalbumin / creatinine urine ratio - CBC with Differential/Platelet - COMPLETE METABOLIC PANEL WITH GFR - Magnesium - Lipid panel - TSH - Hemoglobin A1c - Insulin, random - VITAMIN D 25 Hydroxy            Patient was counseled in prudent diet to achieve/maintain BMI less than 25 for weight control, BP monitoring, regular exercise and medications. Discussed med's effects and SE's. Screening labs and tests as requested with  regular follow-up as recommended. Over 40 minutes of exam, counseling, chart review and high complex critical decision making was performed.   Kirtland Bouchard, MD

## 2020-07-31 ENCOUNTER — Other Ambulatory Visit: Payer: Self-pay | Admitting: Internal Medicine

## 2020-07-31 LAB — COMPLETE METABOLIC PANEL WITH GFR
AG Ratio: 1.6 (calc) (ref 1.0–2.5)
ALT: 23 U/L (ref 6–29)
AST: 17 U/L (ref 10–35)
Albumin: 4.1 g/dL (ref 3.6–5.1)
Alkaline phosphatase (APISO): 73 U/L (ref 37–153)
BUN: 14 mg/dL (ref 7–25)
CO2: 27 mmol/L (ref 20–32)
Calcium: 9.3 mg/dL (ref 8.6–10.4)
Chloride: 104 mmol/L (ref 98–110)
Creat: 0.77 mg/dL (ref 0.50–1.05)
Globulin: 2.6 g/dL (calc) (ref 1.9–3.7)
Glucose, Bld: 84 mg/dL (ref 65–99)
Potassium: 4.4 mmol/L (ref 3.5–5.3)
Sodium: 138 mmol/L (ref 135–146)
Total Bilirubin: 0.6 mg/dL (ref 0.2–1.2)
Total Protein: 6.7 g/dL (ref 6.1–8.1)
eGFR: 84 mL/min/{1.73_m2} (ref >=60)

## 2020-07-31 LAB — MICROALBUMIN / CREATININE URINE RATIO
Creatinine, Urine: 213 mg/dL (ref 20–275)
Microalb Creat Ratio: 6 mcg/mg creat (ref <30)
Microalb, Ur: 1.3 mg/dL

## 2020-07-31 LAB — URINALYSIS, ROUTINE W REFLEX MICROSCOPIC
Bilirubin Urine: NEGATIVE
Glucose, UA: NEGATIVE
Hgb urine dipstick: NEGATIVE
Ketones, ur: NEGATIVE
Leukocytes,Ua: NEGATIVE
Nitrite: NEGATIVE
Protein, ur: NEGATIVE
Specific Gravity, Urine: 1.022 (ref 1.001–1.035)
pH: 5.5 (ref 5.0–8.0)

## 2020-07-31 LAB — CBC WITH DIFFERENTIAL/PLATELET
Absolute Monocytes: 582 cells/uL (ref 200–950)
Basophils Absolute: 33 cells/uL (ref 0–200)
Basophils Relative: 0.4 %
Eosinophils Absolute: 98 cells/uL (ref 15–500)
Eosinophils Relative: 1.2 %
HCT: 42.8 % (ref 35.0–45.0)
Hemoglobin: 14 g/dL (ref 11.7–15.5)
Lymphs Abs: 2427 cells/uL (ref 850–3900)
MCH: 29.8 pg (ref 27.0–33.0)
MCHC: 32.7 g/dL (ref 32.0–36.0)
MCV: 91.1 fL (ref 80.0–100.0)
MPV: 11.8 fL (ref 7.5–12.5)
Monocytes Relative: 7.1 %
Neutro Abs: 5059 cells/uL (ref 1500–7800)
Neutrophils Relative %: 61.7 %
Platelets: 244 10*3/uL (ref 140–400)
RBC: 4.7 10*6/uL (ref 3.80–5.10)
RDW: 12.2 % (ref 11.0–15.0)
Total Lymphocyte: 29.6 %
WBC: 8.2 10*3/uL (ref 3.8–10.8)

## 2020-07-31 LAB — HEMOGLOBIN A1C
Hgb A1c MFr Bld: 5.4 % of total Hgb (ref <5.7)
Mean Plasma Glucose: 108 mg/dL
eAG (mmol/L): 6 mmol/L

## 2020-07-31 LAB — TSH: TSH: 1.1 mIU/L (ref 0.40–4.50)

## 2020-07-31 LAB — LIPID PANEL
Cholesterol: 176 mg/dL (ref <200)
HDL: 69 mg/dL (ref >=50)
LDL Cholesterol (Calc): 87 mg/dL (calc)
Non-HDL Cholesterol (Calc): 107 mg/dL (calc) (ref <130)
Total CHOL/HDL Ratio: 2.6 (calc) (ref <5.0)
Triglycerides: 103 mg/dL (ref <150)

## 2020-07-31 LAB — INSULIN, RANDOM: Insulin: 4.8 u[IU]/mL

## 2020-07-31 LAB — VITAMIN D 25 HYDROXY (VIT D DEFICIENCY, FRACTURES): Vit D, 25-Hydroxy: 78 ng/mL (ref 30–100)

## 2020-07-31 LAB — MAGNESIUM: Magnesium: 2.1 mg/dL (ref 1.5–2.5)

## 2020-07-31 NOTE — Progress Notes (Signed)
============================================================ -   Test results slightly outside the reference range are not unusual. If there is anything important, I will review this with you,  otherwise it is considered normal test values.  If you have further questions,  please do not hesitate to contact me at the office or via My Chart.  ============================================================ ============================================================  -  Total Chol = 176 and LDL Chol 87 - Both  Excellent   - Very low risk for Heart Attack  / Stroke ============================================================ ============================================================  - A1c = 5.4% - Normal - Great - No Diabetes ! ============================================================ ============================================================  - Vitamin D = 78 - Excellent ! ============================================================ ============================================================  - All Else - CBC - Kidneys - Electrolytes - Liver - Magnesium & Thyroid    - all  Normal / OK ===========================================================   - Keep up the [place] Work !  ============================================================ ============================================================

## 2020-09-04 ENCOUNTER — Other Ambulatory Visit: Payer: Self-pay | Admitting: Adult Health

## 2020-09-05 ENCOUNTER — Other Ambulatory Visit: Payer: Self-pay | Admitting: Internal Medicine

## 2020-09-05 DIAGNOSIS — I712 Thoracic aortic aneurysm, without rupture, unspecified: Secondary | ICD-10-CM

## 2020-09-05 DIAGNOSIS — Z1212 Encounter for screening for malignant neoplasm of rectum: Secondary | ICD-10-CM

## 2020-09-05 DIAGNOSIS — Z1211 Encounter for screening for malignant neoplasm of colon: Secondary | ICD-10-CM

## 2020-09-05 DIAGNOSIS — D126 Benign neoplasm of colon, unspecified: Secondary | ICD-10-CM

## 2020-09-12 ENCOUNTER — Ambulatory Visit (HOSPITAL_COMMUNITY): Payer: Medicare Other

## 2020-09-14 ENCOUNTER — Ambulatory Visit: Payer: Medicare Other | Admitting: Cardiovascular Disease

## 2020-09-18 ENCOUNTER — Other Ambulatory Visit: Payer: Self-pay | Admitting: Internal Medicine

## 2020-09-18 DIAGNOSIS — I712 Thoracic aortic aneurysm, without rupture, unspecified: Secondary | ICD-10-CM

## 2020-09-19 ENCOUNTER — Other Ambulatory Visit: Payer: Self-pay

## 2020-09-19 ENCOUNTER — Ambulatory Visit (HOSPITAL_COMMUNITY)
Admission: RE | Admit: 2020-09-19 | Discharge: 2020-09-19 | Disposition: A | Discharge status: Home / Self Care | Payer: Medicare Other | Source: Ambulatory Visit | Attending: Internal Medicine | Admitting: Internal Medicine

## 2020-09-19 DIAGNOSIS — I712 Thoracic aortic aneurysm, without rupture, unspecified: Secondary | ICD-10-CM

## 2020-09-19 LAB — POCT I-STAT CREATININE: Creatinine, Ser: 0.9 mg/dL (ref 0.44–1.00)

## 2020-09-19 MED ORDER — IOHEXOL 350 MG/ML SOLN
75.0000 mL | Freq: Once | INTRAVENOUS | Status: AC | PRN
  Administered 2020-09-19: 75 mL via INTRAVENOUS

## 2020-09-19 NOTE — Progress Notes (Signed)
============================================================ ============================================================  -   CT Angio - >  GREAT ! Stable - No Change &                                                                    recommended continued annual f/u   ============================================================ ============================================================

## 2020-10-05 ENCOUNTER — Other Ambulatory Visit: Payer: Self-pay | Admitting: Internal Medicine

## 2020-10-05 DIAGNOSIS — F411 Generalized anxiety disorder: Secondary | ICD-10-CM

## 2020-10-05 MED ORDER — ALPRAZOLAM 1 MG PO TABS: ORAL_TABLET | ORAL | 0 refills | Status: DC

## 2020-10-16 IMAGING — CT CT ANGIOGRAPHY CHEST
2 series · 19 of 32 positions shown · IV contrast (APPLIED)
Comparison: July 21, 2017

CLINICAL DATA: 68-year-old female with thoracic aneurysm

EXAM:
CT ANGIOGRAPHY CHEST WITH CONTRAST
TECHNIQUE: Multidetector CT imaging of the chest was performed using the
standard protocol during bolus administration of intravenous
contrast. Multiplanar CT image reconstructions and MIPs were
obtained to evaluate the vascular anatomy.
CONTRAST:  75mL WRTIWM-BU7 IOPAMIDOL (WRTIWM-BU7) INJECTION 76%

[Series 4: chest angio · axial · 0.83mm/px · z∈[-341,-50]mm · 11 of 117 slices shown]
[im 10/117  lung]
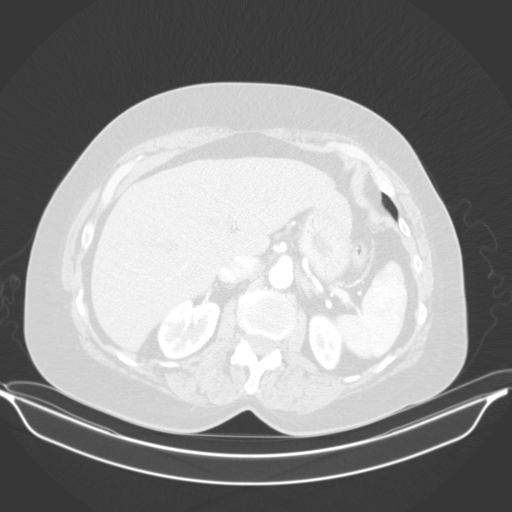
[im 20/117  soft-tissue]
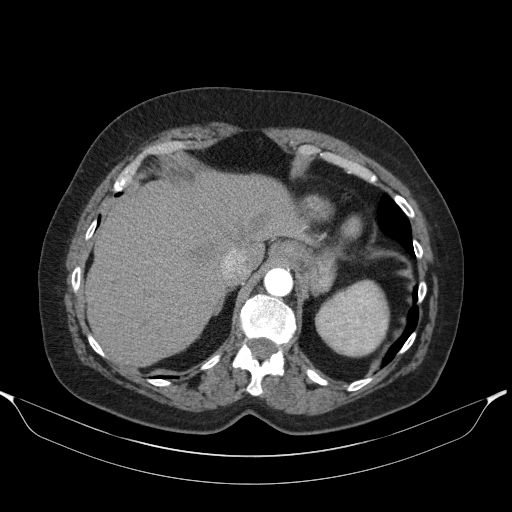
[im 30/117  lung]
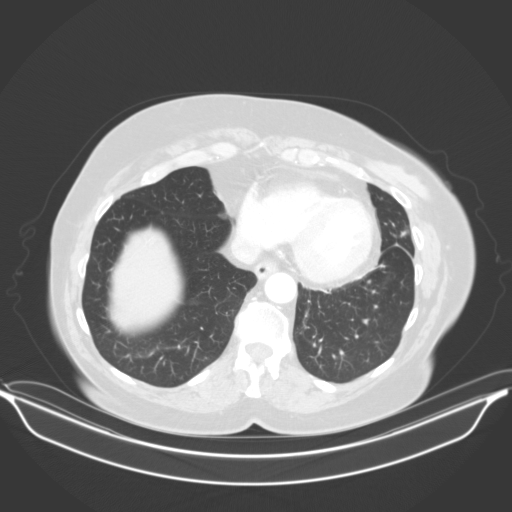
[im 39/117  soft-tissue]
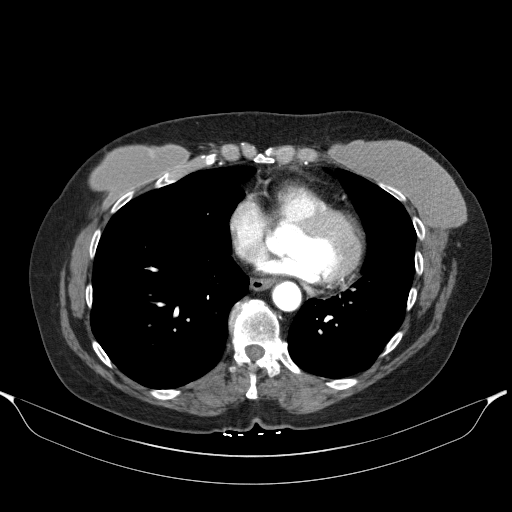
[im 49/117  lung]
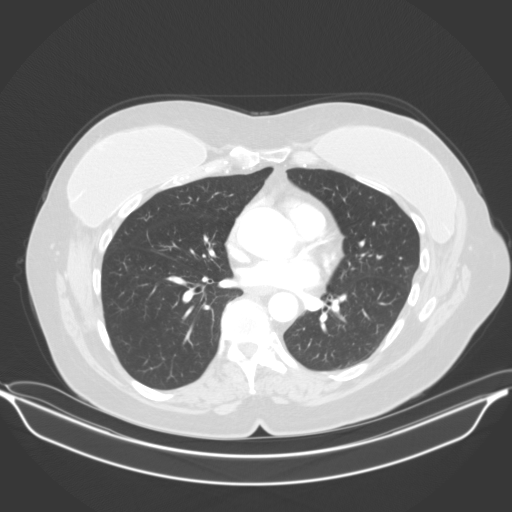
[im 59/117  soft-tissue]
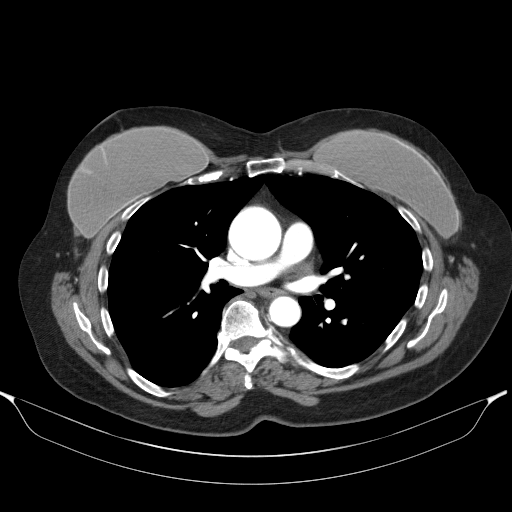
[im 68/117  lung]
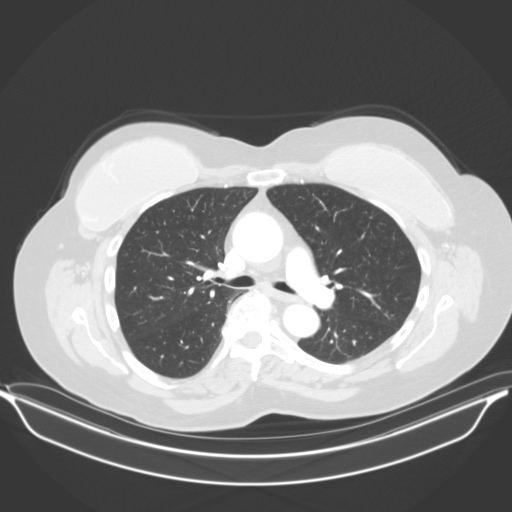
[im 78/117  soft-tissue]
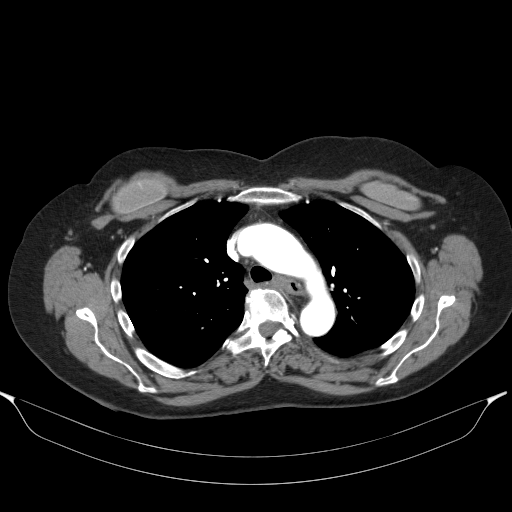
[im 88/117  lung]
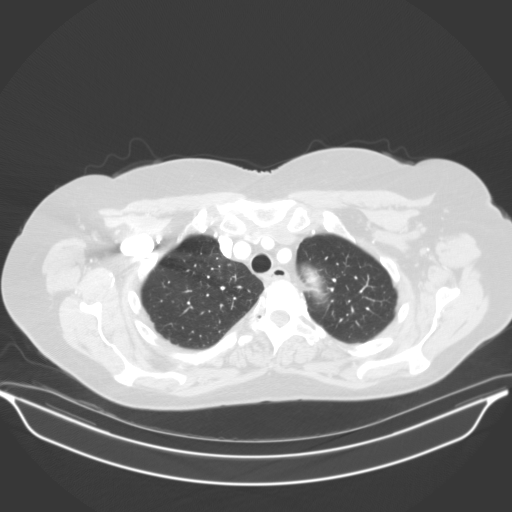
[im 97/117  soft-tissue]
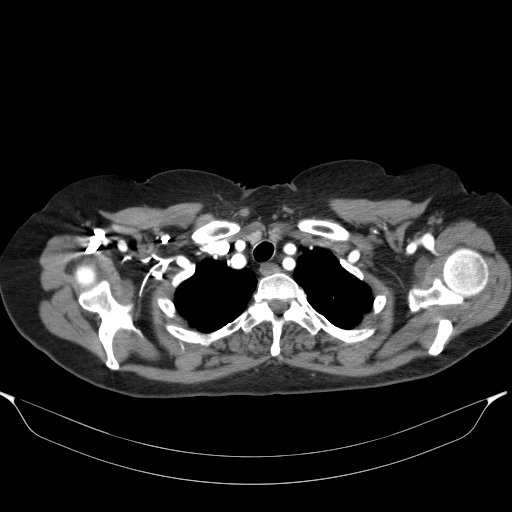
[im 107/117  lung]
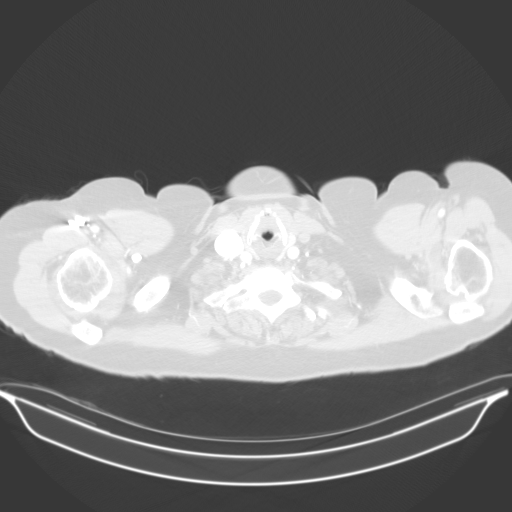

[Series 7: lung · axial · 0.83mm/px · z∈[-324,-58]mm · 8 of 172 slices shown]
[im 20/172  soft-tissue]
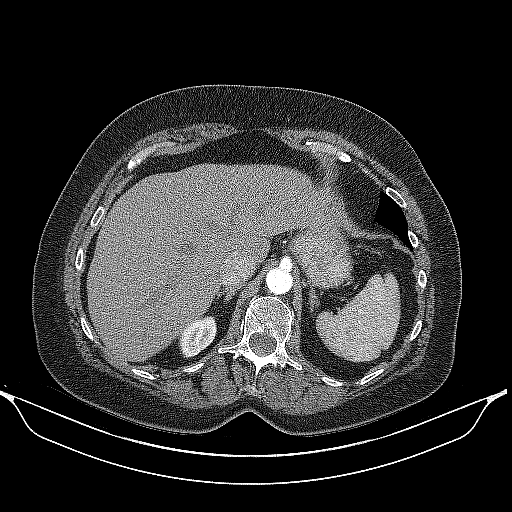
[im 39/172  soft-tissue]
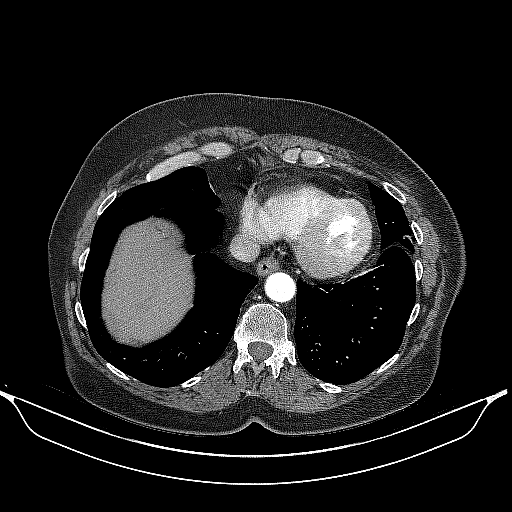
[im 58/172  soft-tissue]
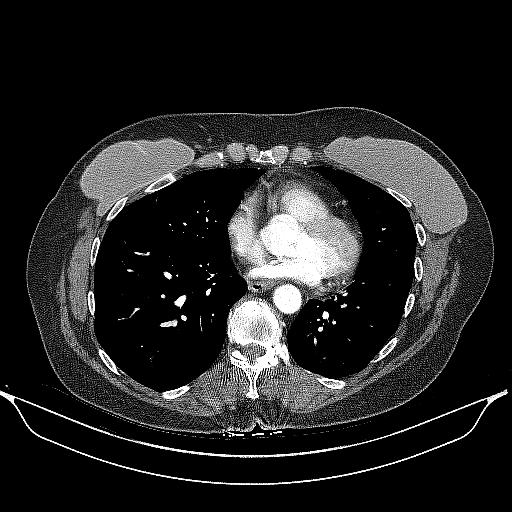
[im 77/172  soft-tissue]
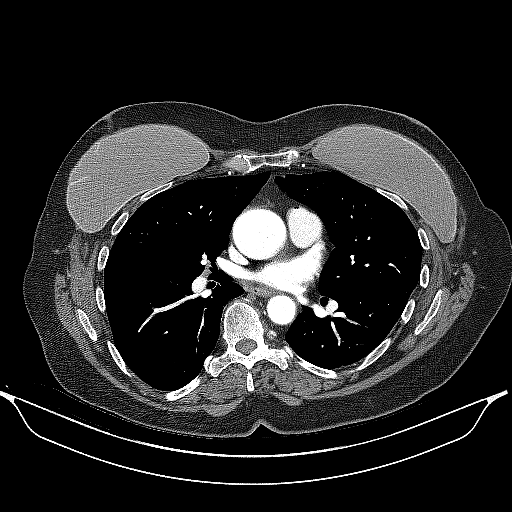
[im 96/172  soft-tissue]
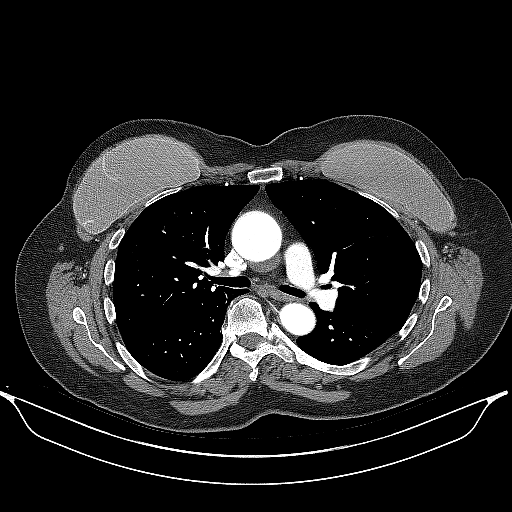
[im 115/172  soft-tissue]
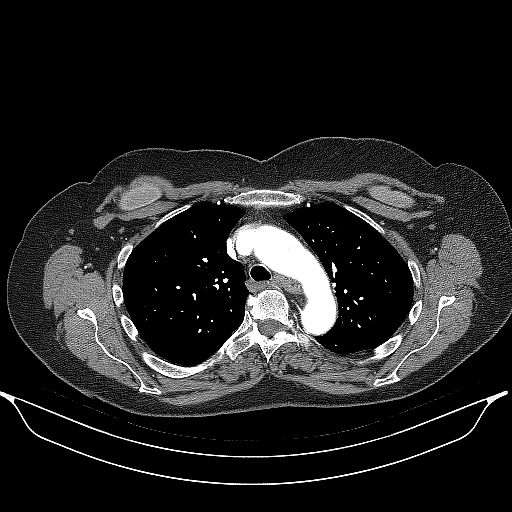
[im 134/172  soft-tissue]
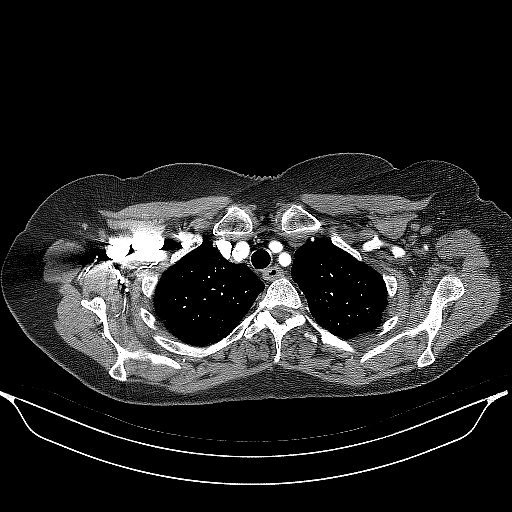
[im 153/172  soft-tissue]
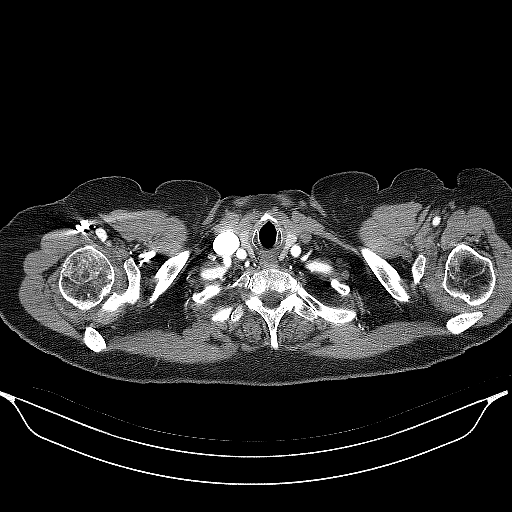

[19 of 32 positions shown; findings below may reference images not displayed]

FINDINGS: Cardiovascular:

Heart:

No cardiomegaly. No pericardial fluid/thickening. Calcifications of
the left anterior descending coronary artery

Aorta:

Diameter of the ascending aorta measures 43 mm. Measurement on the
prior CT was 43 mm. No dissection or periaortic fluid. No
significant atherosclerotic changes. Three vessel arch. Unremarkable
course caliber and contour of the descending thoracic aorta.

Pulmonary arteries:

Bolus is not timed for a most sensitive evaluation of the pulmonary
arteries, however, no proximal filling defects are identified.

Mediastinum/Nodes: No mediastinal adenopathy. Unremarkable
appearance of the thoracic esophagus.

Unremarkable thoracic inlet

Lungs/Pleura: Central airways are clear. No pleural effusion. No
confluent airspace disease.

No pneumothorax.

Upper Abdomen: No acute.

Chest wall: Surgical changes of bilateral mastectomy and
reconstruction

Musculoskeletal: No acute displaced fracture. Degenerative changes
of the spine.

Review of the MIP images confirms the above findings.
IMPRESSION: Ascending aorta measures 4.3 cm. Recommend annual imaging followup
by CTA or MRA. This recommendation follows 6252
ACCF/AHA/AATS/ACR/ASA/SCA/AUJLA/TZIMHS/JUMPER/MORALEZ Guidelines for the
Diagnosis and Management of Patients with Thoracic Aortic Disease.
Circulation. 6252; 121: [id number]. Aortic aneurysm NOS (BWGI2-2W1.C)

Coronary artery disease.

## 2020-10-17 ENCOUNTER — Encounter: Payer: Self-pay | Admitting: Nurse Practitioner

## 2020-10-17 ENCOUNTER — Ambulatory Visit (INDEPENDENT_AMBULATORY_CARE_PROVIDER_SITE_OTHER): Payer: Medicare Other | Admitting: Nurse Practitioner

## 2020-10-17 VITALS — BP 102/74 | HR 58 | Ht 68.0 in | Wt 185.4 lb

## 2020-10-17 DIAGNOSIS — K589 Irritable bowel syndrome without diarrhea: Secondary | ICD-10-CM

## 2020-10-17 DIAGNOSIS — Z86010 Personal history of colonic polyps: Secondary | ICD-10-CM

## 2020-10-17 NOTE — Progress Notes (Signed)
ASSESSMENT AND PLAN    #70 year old female with a history of adenomatous colon polyps.  She had a diminutive tubular adenoma removed at time of screening colonoscopy in September 2015. Due for surveillance colonoscopy ( received recall letter).  ---Schedule colonoscopy. The risks and benefits of colonoscopy with possible polypectomy / biopsies were discussed and the patient agrees to proceed.   # Chronic alternating constipation and diarrhea with abdominal pain ( relieved with defecation). Probable IBS. Overall I think her predominant pattern is that of constipation. She manages well for the most part with diet and Mg+ supplement which PCP has her on for hypomagnesemia. Sometimes has diarrhea depending on what she eats.  --Avoid foods that trigger diarrhea. Consider artificial sweeteners as possible culprits.  --High fiber diet brochure given.   # History of dilated ascending thoracic aorta.  Recent CTA of chest shows stable appearance of ascending thoracic aorta measuring up to 4.3 cm.   HISTORY OF PRESENT ILLNESS     Chief Complaint : History of colon polyps, diarrhea, IBS.   Taylor Li is a 70 y.o. female known to Dr Carlean Purl with a past medical history significant for PAF s/p remote cardioversion,  C. difficile infection in 2018, hypertension, hyperlipidemia, prediabetes, vitamin D deficiency , thoracic history of adenomatous colon polyps, generalized anxiety disorder,  cholecystectomy, appendectomy. See PMH below for any additional history.   Patient referred by PCP for her history of adenomatous colon polyps and IBS symptoms. She gives a history of chronic constipation alternating with diarrhea. After making dietary changes to low cholesterol a year or so ago she noticed improvement in bowel habits as far as constipation.  Additionally she says her PCP has her taking Mg+ for hypomagnesemia which helps keep her regular. Still occasionally has constipation with straining and a while  back thought she strained hard enough and caused rectal prolapse.   With diet changes her lipids improved and patient says she slacked off on her healthy eating which has led to occasional explosive diarrhea and sometimes post - prandial loose stool with associated with periumbilical pain.  The abdominal pain is relieved with defecation. She is aware of which foods tend to trigger diarrhea. Diary actually constipates her. She does use artifical sweeteners but hasn't noticed correlation with diarrhea. .  No.recent antibiotics. Overall in current state her bowel movements are most normal . No blood in stool.   Data Reviewed: July 2022 Hemoglobin 14 CMP normal   PREVIOUS GI EVALUATIONS:   September 2015 screening colonoscopy. --Complete exam, excellent prep.  2 polyps ranging in size from 3 to 5 mm were removed from the rectum and sigmoid colon.  Pathology compatible with a diminutive adenoma and a hyperplastic polyp.    Past Medical History:  Diagnosis Date   A-fib ([place])    Arthritis    C. difficile diarrhea    Hyperlipidemia    Hypertension    Personal history of colonic polyp- adenoma 09/22/2013   Pre-diabetes    Vitamin D deficiency      Past Surgical History:  Procedure Laterality Date   APPENDECTOMY     BREAST SURGERY Bilateral 1983   SQ mastectomies   CARDIOVERSION     CHOLECYSTECTOMY     COLONOSCOPY     LAMINECTOMY  1982   L4-L5   NASAL SEPTUM SURGERY     TONSILLECTOMY     VAGINAL HYSTERECTOMY  1985   Family History  Problem Relation Age of Onset   Hypertension Father  Lung cancer Father    Thyroid disease Father    Hypertension Mother    Barrett's esophagus Mother    Mitral valve prolapse Mother    Atrial fibrillation Mother    Colon cancer Neg Hx    Social History   Tobacco Use   Smoking status: Former    Types: Cigarettes    Quit date: 04/18/2012    Years since quitting: 8.5   Smokeless tobacco: Former   Tobacco comments:    e-cigarettes  Substance  Use Topics   Alcohol use: Not Currently   Drug use: No   Current Outpatient Medications  Medication Sig Dispense Refill   ALPRAZolam (XANAX) 1 MG tablet Take  1/2 - 1 tablet  2 - 3 x /day  ONLY if needed for PANIC or  Anxiety Attack &  limit to 5 days /week to avoid Addiction & Dementia 60 tablet 0   Ascorbic Acid (VITAMIN C) 1000 MG tablet Take 1,000 mg by mouth daily.     aspirin 81 MG tablet Take 81 mg by mouth daily.     BLACK PEPPER-TURMERIC PO Take 1,000 mg by mouth daily.      buPROPion (WELLBUTRIN XL) 300 MG 24 hr tablet TAKE 1 TABLET DAILY FOR MOOD, FOCUS & CONCENTRATION 90 tablet 3   Cholecalciferol (VITAMIN D PO) Take 5,000 Units by mouth 2 (two) times daily.      citalopram (CELEXA) 40 MG tablet TAKE 1 TABLET BY MOUTH DAILY FOR MOOD 90 tablet 0   cyclobenzaprine (FLEXERIL) 10 MG tablet Take  1/2 to 1 tablet   2 to 3 x /day  as needed for Muscle Spasm 90 tablet 0   Magnesium 500 MG TABS Take by mouth. Takes 2 tablets daily     Multiple Vitamin (MULTIVITAMIN) tablet Take 1 tablet by mouth daily.     Omega-3 Fatty Acids (FISH OIL) 1000 MG CAPS Take by mouth daily.     Probiotic Product (PROBIOTIC DAILY PO) Take 1 tablet by mouth daily.     telmisartan (MICARDIS) 20 MG tablet Take  1 tablet  Daily  for BP 90 tablet 3   No current facility-administered medications for this visit.   Allergies  Allergen Reactions   Rosuvastatin     myalgias     Review of Systems: All  systems reviewed and negative except where noted in HPI.    PHYSICAL EXAM :    Wt Readings from Last 3 Encounters:  07/30/20 190 lb 3.2 oz (86.3 kg)  05/30/20 199 lb (90.3 kg)  04/04/20 203 lb 6.4 oz (92.3 kg)    BP 102/74   Pulse (!) 58   Ht 5\' 8"  (1.727 m)   Wt 185 lb 6 oz (84.1 kg)   SpO2 96%   BMI 28.19 kg/m  Constitutional:  Generally well appearing female in no acute distress. Psychiatric: Pleasant. Normal mood and affect. Behavior is normal. EENT: Pupils normal.  Conjunctivae are normal. No  scleral icterus. Neck supple.  Cardiovascular: Normal rate, regular rhythm. No edema Pulmonary/chest: Effort normal and breath sounds normal. No wheezing, rales or rhonchi. Abdominal: Soft, nondistended, nontender. Bowel sounds active throughout. There are no masses palpable. No hepatomegaly. Neurological: Alert and oriented to person place and time. Skin: Skin is warm and dry. No rashes noted.  Tye Savoy, NP  10/17/2020, 8:51 AM  Cc:  Referring Provider Unk Pinto, MD

## 2020-10-17 NOTE — Patient Instructions (Signed)
If you are age 68 or older, your body mass index should be between 23-30. Your Body mass index is 28.19 kg/m. If this is out of the aforementioned range listed, please consider follow up with your Primary Care Provider. __________________________________________________________  The [facility] providers would like to encourage you to use [facility] to communicate with providers for non-urgent requests or questions.  Due to long hold times on the telephone, sending your provider a message by [facility] may be a faster and more efficient way to get a response.  Please allow 48 business hours for a response.  Please remember that this is for non-urgent requests.   You have been scheduled for a colonoscopy. Please follow written instructions given to you at your visit today.  Please pick up your prep supplies at the pharmacy within the next 1-3 days. If you use inhalers (even only as needed), please bring them with you on the day of your procedure.  Follow up pending the results of your Colonoscopy.  Thank you for entrusting me with your care and choosing [facility].  Tye Savoy, NP-C

## 2020-10-18 ENCOUNTER — Other Ambulatory Visit: Payer: Self-pay | Admitting: Internal Medicine

## 2020-10-25 ENCOUNTER — Ambulatory Visit (AMBULATORY_SURGERY_CENTER): Payer: Medicare Other | Admitting: Internal Medicine

## 2020-10-25 ENCOUNTER — Other Ambulatory Visit: Payer: Self-pay

## 2020-10-25 ENCOUNTER — Encounter: Payer: Self-pay | Admitting: Internal Medicine

## 2020-10-25 VITALS — BP 138/84 | HR 79 | Temp 97.8°F | Resp 20 | Ht 68.0 in | Wt 185.0 lb

## 2020-10-25 DIAGNOSIS — I4891 Unspecified atrial fibrillation: Secondary | ICD-10-CM | POA: Diagnosis not present

## 2020-10-25 DIAGNOSIS — E785 Hyperlipidemia, unspecified: Secondary | ICD-10-CM | POA: Diagnosis not present

## 2020-10-25 DIAGNOSIS — I10 Essential (primary) hypertension: Secondary | ICD-10-CM | POA: Diagnosis not present

## 2020-10-25 DIAGNOSIS — R7303 Prediabetes: Secondary | ICD-10-CM | POA: Diagnosis not present

## 2020-10-25 DIAGNOSIS — Z86010 Personal history of colonic polyps: Secondary | ICD-10-CM

## 2020-10-25 MED ORDER — SODIUM CHLORIDE 0.9 % IV SOLN: 500.0000 mL | Freq: Once | INTRAVENOUS | Status: DC

## 2020-10-25 NOTE — Progress Notes (Signed)
Pt's states no medical or surgical changes since previsit or office visit. VS assessed by D.T 

## 2020-10-25 NOTE — Progress Notes (Signed)
[facility] History and Physical   Primary Care Physician:  Unk Pinto, MD   Reason for Procedure:   Hx colon polyps  Plan:    colonoscopy     HPI: Taylor Li is a 70 y.o. female here for a surveillance colonoscopy   Past Medical History:  Diagnosis Date   A-fib [facility])    Arthritis    C. difficile diarrhea    Hyperlipidemia    Hypertension    Personal history of colonic polyp- adenoma 09/22/2013   Pre-diabetes    Rectal prolapse    Vitamin D deficiency     Past Surgical History:  Procedure Laterality Date   APPENDECTOMY     BREAST SURGERY Bilateral 1983   SQ mastectomies   CARDIOVERSION     CHOLECYSTECTOMY     COLONOSCOPY     LAMINECTOMY  1982   L4-L5   NASAL SEPTUM SURGERY     TONSILLECTOMY     VAGINAL HYSTERECTOMY  1985    Prior to Admission medications   Medication Sig Start Date End Date Taking? Authorizing Provider  ALPRAZolam Duanne Moron) 1 MG tablet Take  1/2 - 1 tablet  2 - 3 x /day  ONLY if needed for PANIC or  Anxiety Attack &  limit to 5 days /week to avoid Addiction & Dementia 10/05/20  Yes Unk Pinto, MD  Ascorbic Acid (VITAMIN C) 1000 MG tablet Take 1,000 mg by mouth daily.   Yes [provider]  aspirin 81 MG tablet Take 81 mg by mouth daily.   Yes [provider]  BLACK PEPPER-TURMERIC PO Take 1,000 mg by mouth daily.    Yes [provider]  buPROPion (WELLBUTRIN XL) 300 MG 24 hr tablet TAKE 1 TABLET DAILY FOR MOOD, FOCUS & CONCENTRATION 11/13/19  Yes Unk Pinto, MD  Cholecalciferol (VITAMIN D PO) Take 5,000 Units by mouth 2 (two) times daily.    Yes [provider]  citalopram (CELEXA) 40 MG tablet TAKE 1 TABLET BY MOUTH DAILY FOR MOOD 09/04/20  Yes Magda Bernheim, NP  cyclobenzaprine (FLEXERIL) 10 MG tablet TAKE 1/2 TO 1 TABLET 2 TO 3 X /DAY AS NEEDED FOR MUSCLE SPASM 10/18/20  Yes Unk Pinto, MD  Magnesium 500 MG TABS Take by mouth. Takes 2 tablets daily   Yes [provider]  Multiple Vitamin (MULTIVITAMIN) tablet Take 1 tablet by mouth daily.   Yes [provider]  Omega-3 Fatty Acids (FISH OIL) 1000 MG CAPS Take by mouth daily.   Yes [provider]  telmisartan (MICARDIS) 20 MG tablet Take  1 tablet  Daily  for BP 06/09/20  Yes Unk Pinto, MD  Probiotic Product (PROBIOTIC DAILY PO) Take 1 tablet by mouth daily. Patient not taking: Reported on 10/25/2020    [provider]    Current Outpatient Medications  Medication Sig Dispense Refill   ALPRAZolam (XANAX) 1 MG tablet Take  1/2 - 1 tablet  2 - 3 x /day  ONLY if needed for PANIC or  Anxiety Attack &  limit to 5 days /week to avoid Addiction & Dementia 60 tablet 0   Ascorbic Acid (VITAMIN C) 1000 MG tablet Take 1,000 mg by mouth daily.     aspirin 81 MG tablet Take 81 mg by mouth daily.     BLACK PEPPER-TURMERIC PO Take 1,000 mg by mouth daily.      buPROPion (WELLBUTRIN XL) 300 MG 24 hr tablet TAKE 1 TABLET DAILY FOR MOOD, FOCUS & CONCENTRATION 90 tablet  3   Cholecalciferol (VITAMIN D PO) Take 5,000 Units by mouth 2 (two) times daily.      citalopram (CELEXA) 40 MG tablet TAKE 1 TABLET BY MOUTH DAILY FOR MOOD 90 tablet 0   cyclobenzaprine (FLEXERIL) 10 MG tablet TAKE 1/2 TO 1 TABLET 2 TO 3 X /DAY AS NEEDED FOR MUSCLE SPASM 90 tablet 0   Magnesium 500 MG TABS Take by mouth. Takes 2 tablets daily     Multiple Vitamin (MULTIVITAMIN) tablet Take 1 tablet by mouth daily.     Omega-3 Fatty Acids (FISH OIL) 1000 MG CAPS Take by mouth daily.     telmisartan (MICARDIS) 20 MG tablet Take  1 tablet  Daily  for BP 90 tablet 3   Probiotic Product (PROBIOTIC DAILY PO) Take 1 tablet by mouth daily. (Patient not taking: Reported on 10/25/2020)     Current Facility-Administered Medications  Medication Dose Route Frequency Provider Last Rate Last Admin   0.9 %  sodium chloride infusion  500 mL Intravenous Once Gatha Mayer, MD        Allergies as of 10/25/2020 - Review  Complete 10/25/2020  Allergen Reaction Noted   Rosuvastatin  10/15/2017    Family History  Problem Relation Age of Onset   Hypertension Mother    Barrett's esophagus Mother    Mitral valve prolapse Mother    Atrial fibrillation Mother    Hypertension Father    Lung cancer Father    Thyroid disease Father    Clotting disorder Father    Colon cancer Neg Hx    Diabetes Neg Hx    Esophageal cancer Neg Hx    Pancreatic cancer Neg Hx    Stomach cancer Neg Hx     Social History   Socioeconomic History   Marital status: Married    Spouse name: Not on file   Number of children: Not on file   Years of education: Not on file   Highest education level: Not on file  Occupational History   Occupation: RN  Tobacco Use   Smoking status: Former    Types: Cigarettes    Quit date: 04/18/2012    Years since quitting: 8.5   Smokeless tobacco: Never   Tobacco comments:    e-cigarettes  Vaping Use   Vaping Use: Never used  Substance and Sexual Activity   Alcohol use: Yes    Comment: 3-4 glasses of wine per week   Drug use: No   Sexual activity: Not on file  Other Topics Concern   Not on file  Social History Narrative   Married, Therapist, sports   Former tobacco smoker now Patent attorney   + EtOH and caffeine   No drugs   Social Determinants of Radio broadcast assistant Strain: Not on file  Food Insecurity: Not on file  Transportation Needs: Not on file  Physical Activity: Not on file  Stress: Not on file  Social Connections: Not on file  Intimate Partner Violence: Not on file    Review of Systems: All other review of systems negative except as mentioned in the HPI.  Physical Exam: Vital signs BP (!) 150/108   Pulse 75   Temp 97.8 F (36.6 C) (Skin)   Ht 5\' 8"  (1.727 m)   Wt 185 lb (83.9 kg)   SpO2 99%   BMI 28.13 kg/m   General:   Alert,  Well-developed, well-nourished, pleasant and cooperative in NAD Lungs:  Clear throughout to auscultation.   Heart:  Regular rate  and rhythm; no  murmurs, clicks, rubs,  or gallops. Abdomen:  Soft, nontender and nondistended. Normal bowel sounds.   Neuro/Psych:  Alert and cooperative. Normal mood and affect. A and O x 3   @Lylee Corrow  Simonne Maffucci, MD, [facility] [phone] (pager) 10/25/2020 2:15 PM@

## 2020-10-25 NOTE — Op Note (Signed)
[facility] Patient Name: Taylor Li Procedure Date: 10/25/2020 2:07 PM MRN: [id number] Endoscopist: Gatha Mayer , MD Age: 70 Referring MD:  Date of Birth: [birth year] Gender: Female Account #: [id number] Procedure:                Colonoscopy Indications:              Surveillance: Personal history of adenomatous                            polyps on last colonoscopy > 5 years ago Medicines:                Propofol per Anesthesia, Monitored Anesthesia Care Procedure:                Pre-Anesthesia Assessment:                           - Prior to the procedure, a History and Physical                            was performed, and patient medications and                            allergies were reviewed. The patient's tolerance of                            previous anesthesia was also reviewed. The risks                            and benefits of the procedure and the sedation                            options and risks were discussed with the patient.                            All questions were answered, and informed consent                            was obtained. Prior Anticoagulants: The patient has                            taken no previous anticoagulant or antiplatelet                            agents. ASA Grade Assessment: III - A patient with                            severe systemic disease. After reviewing the risks                            and benefits, the patient was deemed in                            satisfactory condition to undergo the procedure.  After obtaining informed consent, the colonoscope                            was passed under direct vision. Throughout the                            procedure, the patient's blood pressure, pulse, and                            oxygen saturations were monitored continuously. The                            CF [id number] #[id number] was introduced through the anus                             and advanced to the the cecum, identified by                            appendiceal orifice and ileocecal valve. The                            colonoscopy was performed without difficulty. The                            patient tolerated the procedure well. The quality                            of the bowel preparation was good. The ileocecal                            valve, appendiceal orifice, and rectum were                            photographed. Scope In: 2:25:26 PM Scope Out: 2:39:59 PM Scope Withdrawal Time: 0 hours 9 minutes 56 seconds  Total Procedure Duration: 0 hours 14 minutes 33 seconds  Findings:                 The perianal and digital rectal examinations were                            normal.                           External and internal hemorrhoids were found.                           The exam was otherwise without abnormality on                            direct and retroflexion views. Complications:            No immediate complications. Estimated Blood Loss:     Estimated blood loss: none. Impression:               - External and internal hemorrhoids.                           -  The examination was otherwise normal on direct                            and retroflexion views.                           - No specimens collected. Recommendation:           - Patient has a contact number available for                            emergencies. The signs and symptoms of potential                            delayed complications were discussed with the                            patient. Return to normal activities tomorrow.                            Written discharge instructions were provided to the                            patient.                           - Resume previous diet.                           - Continue present medications.                           - No repeat colonoscopy due to age and no polyps                            today (diminutive  adenoma 2015). Gatha Mayer, MD 10/25/2020 2:51:35 PM This report has been signed electronically.

## 2020-10-25 NOTE — Progress Notes (Signed)
To PACU, VSS. Report to [email or web address] 

## 2020-10-25 NOTE — Patient Instructions (Signed)
Handout on hemorrhoids given to patient. No polyps today.  Resume previous diet and continue current medications.   YOU HAD AN ENDOSCOPIC PROCEDURE TODAY AT [facility]:   Refer to the procedure report that was given to you for any specific questions about what was found during the examination.  If the procedure report does not answer your questions, please call your gastroenterologist to clarify.  If you requested that your care partner not be given the details of your procedure findings, then the procedure report has been included in a sealed envelope for you to review at your convenience later.  YOU SHOULD EXPECT: Some feelings of bloating in the abdomen. Passage of more gas than usual.  Walking can help get rid of the air that was put into your GI tract during the procedure and reduce the bloating. If you had a lower endoscopy (such as a colonoscopy or flexible sigmoidoscopy) you may notice spotting of blood in your stool or on the toilet paper. If you underwent a bowel prep for your procedure, you may not have a normal bowel movement for a few days.  Please Note:  You might notice some irritation and congestion in your nose or some drainage.  This is from the oxygen used during your procedure.  There is no need for concern and it should clear up in a day or so.  SYMPTOMS TO REPORT IMMEDIATELY:  Following lower endoscopy (colonoscopy or flexible sigmoidoscopy):  Excessive amounts of blood in the stool  Significant tenderness or worsening of abdominal pains  Swelling of the abdomen that is new, acute  Fever of 100F or higher  For urgent or emergent issues, a gastroenterologist can be reached at any hour by calling [phone]. Do not use MyChart messaging for urgent concerns.    DIET:  We do recommend a small meal at first, but then you may proceed to your regular diet.  Drink plenty of fluids but you should avoid alcoholic beverages for 24 hours.  ACTIVITY:  You  should plan to take it easy for the rest of today and you should NOT DRIVE or use heavy machinery until tomorrow (because of the sedation medicines used during the test).    FOLLOW UP: Our staff will call the number listed on your records 48-72 hours following your procedure to check on you and address any questions or concerns that you may have regarding the information given to you following your procedure. If we do not reach you, we will leave a message.  We will attempt to reach you two times.  During this call, we will ask if you have developed any symptoms of COVID 19. If you develop any symptoms (ie: fever, flu-like symptoms, shortness of breath, cough etc.) before then, please call [phone].  If you test positive for Covid 19 in the 2 weeks post procedure, please call and report this information to [place].    If any biopsies were taken you will be contacted by phone or by letter within the next 1-3 weeks.  Please call us at [phone] if you have not heard about the biopsies in 3 weeks.    SIGNATURES/CONFIDENTIALITY: You and/or your care partner have signed paperwork which will be entered into your electronic medical record.  These signatures attest to the fact that that the information above on your After Visit Summary has been reviewed and is understood.  Full responsibility of the confidentiality of this discharge information lies with you and/or your care-partner.

## 2020-10-27 DIAGNOSIS — Z23 Encounter for immunization: Secondary | ICD-10-CM | POA: Diagnosis not present

## 2020-10-29 ENCOUNTER — Telehealth: Payer: Self-pay

## 2020-10-29 NOTE — Telephone Encounter (Signed)
  Follow up Call-  Call back number 10/25/2020  Post procedure Call Back phone  # [phone]  Permission to leave phone message Yes  Some recent data might be hidden     Patient questions:  Do you have a fever, pain , or abdominal swelling? No. Pain Score  0 *  Have you tolerated food without any problems? Yes.    Have you been able to return to your normal activities? Yes.    Do you have any questions about your discharge instructions: Diet   No. Medications  No. Follow up visit  No.  Do you have questions or concerns about your Care? No  Actions: * If pain score is 4 or above: No action needed, pain <4. Have you developed a fever since your procedure? no  2.   Have you had an respiratory symptoms (SOB or cough) since your procedure? no  3.   Have you tested positive for COVID 19 since your procedure no  4.   Have you had any family members/close contacts diagnosed with the COVID 19 since your procedure?  no   If yes to any of these questions please route to Joylene John, RN and Joella Prince, RN

## 2020-11-08 ENCOUNTER — Other Ambulatory Visit: Payer: Self-pay | Admitting: Internal Medicine

## 2020-11-08 DIAGNOSIS — Z1231 Encounter for screening mammogram for malignant neoplasm of breast: Secondary | ICD-10-CM

## 2020-11-16 ENCOUNTER — Other Ambulatory Visit: Payer: Self-pay | Admitting: Internal Medicine

## 2020-11-20 ENCOUNTER — Other Ambulatory Visit: Payer: Self-pay | Admitting: Internal Medicine

## 2020-12-03 ENCOUNTER — Other Ambulatory Visit: Payer: Self-pay | Admitting: Nurse Practitioner

## 2020-12-03 ENCOUNTER — Other Ambulatory Visit: Payer: Self-pay | Admitting: Internal Medicine

## 2020-12-03 DIAGNOSIS — F411 Generalized anxiety disorder: Secondary | ICD-10-CM

## 2020-12-24 NOTE — Progress Notes (Deleted)
FOLLOW UP  Assessment and Plan:   Thoracic Aortic Aneurysm without rupture Control BP, followed by Dr. Roxan Li Monitor annually- stable 09/19/20 CT  Atrial fib (Taylor Li) No known episodes after CV 2002/2004 Normal rate and rhythm today Continue follow up cardiology as recommended  Hypertension Well controlled with current medications  Monitor blood pressure at home; patient to call if consistently greater than 130/80 Continue DASH diet.   Reminder to go to the ER if any CP, SOB, nausea, dizziness, severe HA, changes vision/speech, left arm numbness and tingling and jaw pain.  Cholesterol Currently at goal on rosuvastatin 5 mg three days a week Continue low cholesterol diet and exercise.  Check lipid panel.   Other abnormal glucose Recent A1Cs at goal Continue diet and exercise.  Perform daily foot/skin check, notify office of any concerning changes.  Defer A1C;   COPD Asymptomatic Continue to monitor  Overweight Long discussion about weight loss, diet, and exercise Recommended diet heavy in fruits and veggies and low in animal meats, cheeses, and dairy products, appropriate calorie intake Discussed ideal weight for height - goal weight for next visit 190 lb Patient will work on portion control, walking more regularly   Will follow up in 3 months  Depression/anxiety continue with wellbutrin, celexa and PRN xanax- typically using in the evening, doing well. Encouraged to avoid daily use if possible.  Lifestyle discussed: diet/exerise, sleep hygiene, stress management, hydration  Vitamin D Def/ osteoporosis prevention Near goal at last check; continue to recommend supplementation for goal of 60-100 Defer vitamin D level to next OV  Continue diet and meds as discussed. Further disposition pending results of labs. Discussed med's effects and SE's.   Over 30 minutes of exam, counseling, chart review, and critical decision making was performed.   Future Appointments   Date Time Provider [place]  12/26/2020 11:00 AM Taylor Bernheim, NP [facility] None  01/17/2021 12:30 PM [facility]  07/30/2021  2:00 PM Taylor Pinto, MD [facility] None    ----------------------------------------------------------------------------------------------------------------------  HPI 70 y.o. female  presents for 3 month follow up on hypertension, cholesterol, glucose management, weight, depression/anxiety and vitamin D deficiency.    She has hx/o pAfib (2002/2004)  S/p CV and a negative Cardiolite in 2011 without further known episodes. On bASA.    She reports she is doing much better recently with chronic pain of hips and back fully resolved. She does follow with Dr. Berenice Li.   she has a diagnosis of depression/anxiety and is currently on wellbutrin 300 mg mg daily, celexa 20 mg daily, and xanax 1 mg TID PRN, reports symptoms are well controlled on current regimen. she reports she takes 1 at night, but hasn't needed during the day these days.   BMI is There is no height or weight on file to calculate BMI., she has been working on diet and exercise but plans to start soon. She has a treadmill but admits she hasn't been using very regularly. She admits she has been eating more while. She has goal weight of 190 lb.  Wt Readings from Last 3 Encounters:  10/25/20 185 lb (83.9 kg)  10/17/20 185 lb 6 oz (84.1 kg)  07/30/20 190 lb 3.2 oz (86.3 kg)   Her blood pressure has been controlled at home, today their BP is    She does not workout. She denies chest pain, shortness of breath, dizziness.   She is on cholesterol medication (rosuvastatin 5 mg three days a week) and  denies myalgias. Her cholesterol is at goal. The cholesterol last visit was:   Lab Results  Component Value Date   CHOL 176 07/30/2020   HDL 69 07/30/2020   LDLCALC 87 07/30/2020   TRIG 103 07/30/2020   CHOLHDL 2.6 07/30/2020    She has not been working on diet and exercise for  glucose management, and denies increased appetite, nausea, paresthesia of the feet, polydipsia, polyuria, visual disturbances, vomiting and weight loss. Last A1C in the office was:  Lab Results  Component Value Date   HGBA1C 5.4 07/30/2020   Patient is on Vitamin D supplement and near goal at most recent check:    Lab Results  Component Value Date   VD25OH 78 07/30/2020  Taking 5000 IU daily    Current Medications:  Current Outpatient Medications on File Prior to Visit  Medication Sig   ALPRAZolam (XANAX) 1 MG tablet TAKE 1/2 - 1 TABLET 2 - 3 X /DAY ONLY IF NEEDED FOR PANIC OR ANXIETY ATTACK & LIMIT TO 5 DAYS /WEEK TO AVOID ADDICTION & DEMENTIA   Ascorbic Acid (VITAMIN C) 1000 MG tablet Take 1,000 mg by mouth daily.   aspirin 81 MG tablet Take 81 mg by mouth daily.   BLACK PEPPER-TURMERIC PO Take 1,000 mg by mouth daily.    buPROPion (WELLBUTRIN XL) 300 MG 24 hr tablet TAKE 1 TABLET DAILY FOR MOOD, FOCUS & CONCENTRATION   Cholecalciferol (VITAMIN D PO) Take 5,000 Units by mouth 2 (two) times daily.    citalopram (CELEXA) 40 MG tablet TAKE 1 TABLET BY MOUTH DAILY FOR MOOD   cyclobenzaprine (FLEXERIL) 10 MG tablet TAKE 1/2 TO 1 TABLET 2 TO 3 X /DAY AS NEEDED FOR MUSCLE SPASM   Magnesium 500 MG TABS Take by mouth. Takes 2 tablets daily   Multiple Vitamin (MULTIVITAMIN) tablet Take 1 tablet by mouth daily.   Omega-3 Fatty Acids (FISH OIL) 1000 MG CAPS Take by mouth daily.   Probiotic Product (PROBIOTIC DAILY PO) Take 1 tablet by mouth daily. (Patient not taking: Reported on 10/25/2020)   telmisartan (MICARDIS) 20 MG tablet Take  1 tablet  Daily  for BP   No current facility-administered medications on file prior to visit.     Allergies:  Allergies  Allergen Reactions   Rosuvastatin     myalgias     Medical History:  Past Medical History:  Diagnosis Date   A-fib ([place])    Arthritis    C. difficile diarrhea    Hyperlipidemia    Hypertension    Personal history of colonic  polyp- adenoma 09/22/2013   Pre-diabetes    Rectal prolapse    Vitamin D deficiency    Family history- Reviewed and unchanged Social history- Reviewed and unchanged   Review of Systems:  Review of Systems  Constitutional:  Negative for malaise/fatigue and weight loss.  HENT:  Negative for hearing loss and tinnitus.   Eyes:  Negative for blurred vision and double vision.  Respiratory:  Negative for cough, shortness of breath and wheezing.   Cardiovascular:  Negative for chest pain, palpitations, orthopnea, claudication and leg swelling.  Gastrointestinal:  Negative for abdominal pain, blood in stool, constipation, diarrhea, heartburn, melena, nausea and vomiting.  Genitourinary: Negative.   Musculoskeletal:  Negative for joint pain and myalgias.  Skin:  Negative for rash.  Neurological:  Negative for dizziness, tingling, sensory change, weakness and headaches.  Endo/Heme/Allergies:  Negative for polydipsia.  Psychiatric/Behavioral:  Negative for depression, memory loss, substance abuse and suicidal ideas.  The patient is nervous/anxious (Rare) and has insomnia (Difficulty falling sleep without xanax).   All other systems reviewed and are negative.  Physical Exam: There were no vitals taken for this visit. Wt Readings from Last 3 Encounters:  10/25/20 185 lb (83.9 kg)  10/17/20 185 lb 6 oz (84.1 kg)  07/30/20 190 lb 3.2 oz (86.3 kg)   General : Well sounding patient in no apparent distress HEENT: no hoarseness, no cough for duration of visit Lungs: speaks in complete sentences, no audible wheezing, no apparent distress Neurological: alert, oriented x 3 Psychiatric: pleasant, judgement appropriate    Taylor Li Kathyrn Drown, NP 12:57 PM [facility]

## 2020-12-26 ENCOUNTER — Ambulatory Visit: Payer: Medicare Other | Admitting: Nurse Practitioner

## 2020-12-26 DIAGNOSIS — E782 Mixed hyperlipidemia: Secondary | ICD-10-CM

## 2020-12-26 DIAGNOSIS — E559 Vitamin D deficiency, unspecified: Secondary | ICD-10-CM

## 2020-12-26 DIAGNOSIS — I10 Essential (primary) hypertension: Secondary | ICD-10-CM

## 2020-12-26 DIAGNOSIS — F32A Depression, unspecified: Secondary | ICD-10-CM

## 2020-12-26 DIAGNOSIS — R7309 Other abnormal glucose: Secondary | ICD-10-CM

## 2020-12-26 DIAGNOSIS — J449 Chronic obstructive pulmonary disease, unspecified: Secondary | ICD-10-CM

## 2020-12-26 DIAGNOSIS — E663 Overweight: Secondary | ICD-10-CM

## 2020-12-26 DIAGNOSIS — I712 Thoracic aortic aneurysm, without rupture, unspecified: Secondary | ICD-10-CM

## 2020-12-26 DIAGNOSIS — I480 Paroxysmal atrial fibrillation: Secondary | ICD-10-CM

## 2021-01-17 ENCOUNTER — Ambulatory Visit
Admission: RE | Admit: 2021-01-17 | Discharge: 2021-01-17 | Disposition: A | Discharge status: Home / Self Care | Payer: PPO | Source: Ambulatory Visit | Attending: Internal Medicine | Admitting: Internal Medicine

## 2021-01-17 DIAGNOSIS — Z1231 Encounter for screening mammogram for malignant neoplasm of breast: Secondary | ICD-10-CM

## 2021-01-24 ENCOUNTER — Ambulatory Visit: Payer: PPO | Admitting: Adult Health

## 2021-02-03 ENCOUNTER — Other Ambulatory Visit: Payer: Self-pay | Admitting: Internal Medicine

## 2021-02-03 ENCOUNTER — Other Ambulatory Visit: Payer: Self-pay | Admitting: Nurse Practitioner

## 2021-02-03 DIAGNOSIS — F411 Generalized anxiety disorder: Secondary | ICD-10-CM

## 2021-02-20 ENCOUNTER — Ambulatory Visit: Payer: PPO | Admitting: Adult Health

## 2021-02-27 ENCOUNTER — Ambulatory Visit: Payer: PPO | Admitting: Adult Health

## 2021-03-05 ENCOUNTER — Other Ambulatory Visit: Payer: Self-pay | Admitting: Nurse Practitioner

## 2021-03-07 ENCOUNTER — Ambulatory Visit (INDEPENDENT_AMBULATORY_CARE_PROVIDER_SITE_OTHER): Payer: PPO | Admitting: Adult Health

## 2021-03-07 ENCOUNTER — Encounter: Payer: Self-pay | Admitting: Adult Health

## 2021-03-07 ENCOUNTER — Other Ambulatory Visit: Payer: Self-pay

## 2021-03-07 VITALS — BP 120/82 | HR 72 | Temp 97.3°F | Wt 189.0 lb

## 2021-03-07 DIAGNOSIS — I10 Essential (primary) hypertension: Secondary | ICD-10-CM

## 2021-03-07 DIAGNOSIS — E663 Overweight: Secondary | ICD-10-CM

## 2021-03-07 DIAGNOSIS — F32A Depression, unspecified: Secondary | ICD-10-CM

## 2021-03-07 DIAGNOSIS — F419 Anxiety disorder, unspecified: Secondary | ICD-10-CM

## 2021-03-07 DIAGNOSIS — I493 Ventricular premature depolarization: Secondary | ICD-10-CM

## 2021-03-07 DIAGNOSIS — I480 Paroxysmal atrial fibrillation: Secondary | ICD-10-CM | POA: Diagnosis not present

## 2021-03-07 DIAGNOSIS — Z0000 Encounter for general adult medical examination without abnormal findings: Secondary | ICD-10-CM

## 2021-03-07 DIAGNOSIS — J449 Chronic obstructive pulmonary disease, unspecified: Secondary | ICD-10-CM

## 2021-03-07 DIAGNOSIS — Z86010 Personal history of colonic polyps: Secondary | ICD-10-CM

## 2021-03-07 DIAGNOSIS — I7121 Aneurysm of the ascending aorta, without rupture: Secondary | ICD-10-CM

## 2021-03-07 DIAGNOSIS — R6889 Other general symptoms and signs: Secondary | ICD-10-CM | POA: Diagnosis not present

## 2021-03-07 DIAGNOSIS — E559 Vitamin D deficiency, unspecified: Secondary | ICD-10-CM

## 2021-03-07 DIAGNOSIS — Z79899 Other long term (current) drug therapy: Secondary | ICD-10-CM | POA: Diagnosis not present

## 2021-03-07 DIAGNOSIS — Z0001 Encounter for general adult medical examination with abnormal findings: Secondary | ICD-10-CM

## 2021-03-07 DIAGNOSIS — R7309 Other abnormal glucose: Secondary | ICD-10-CM

## 2021-03-07 DIAGNOSIS — E782 Mixed hyperlipidemia: Secondary | ICD-10-CM | POA: Diagnosis not present

## 2021-03-07 DIAGNOSIS — E2839 Other primary ovarian failure: Secondary | ICD-10-CM | POA: Diagnosis not present

## 2021-03-07 LAB — CBC WITH DIFFERENTIAL/PLATELET
Absolute Monocytes: 560 cells/uL (ref 200–950)
Basophils Absolute: 16 cells/uL (ref 0–200)
Basophils Relative: 0.2 %
Eosinophils Absolute: 80 cells/uL (ref 15–500)
Eosinophils Relative: 1 %
HCT: 42 % (ref 35.0–45.0)
Hemoglobin: 14.2 g/dL (ref 11.7–15.5)
Lymphs Abs: 2504 cells/uL (ref 850–3900)
MCH: 30.7 pg (ref 27.0–33.0)
MCHC: 33.8 g/dL (ref 32.0–36.0)
MCV: 90.9 fL (ref 80.0–100.0)
MPV: 11.6 fL (ref 7.5–12.5)
Monocytes Relative: 7 %
Neutro Abs: 4840 cells/uL (ref 1500–7800)
Neutrophils Relative %: 60.5 %
Platelets: 273 10*3/uL (ref 140–400)
RBC: 4.62 10*6/uL (ref 3.80–5.10)
RDW: 12.8 % (ref 11.0–15.0)
Total Lymphocyte: 31.3 %
WBC: 8 10*3/uL (ref 3.8–10.8)

## 2021-03-07 LAB — COMPLETE METABOLIC PANEL WITH GFR
AG Ratio: 2.1 (calc) (ref 1.0–2.5)
ALT: 17 U/L (ref 6–29)
AST: 16 U/L (ref 10–35)
Albumin: 4.7 g/dL (ref 3.6–5.1)
Alkaline phosphatase (APISO): 70 U/L (ref 37–153)
BUN: 16 mg/dL (ref 7–25)
CO2: 25 mmol/L (ref 20–32)
Calcium: 9.9 mg/dL (ref 8.6–10.4)
Chloride: 102 mmol/L (ref 98–110)
Creat: 0.78 mg/dL (ref 0.50–1.05)
Globulin: 2.2 g/dL (calc) (ref 1.9–3.7)
Glucose, Bld: 94 mg/dL (ref 65–99)
Potassium: 4 mmol/L (ref 3.5–5.3)
Sodium: 136 mmol/L (ref 135–146)
Total Bilirubin: 0.6 mg/dL (ref 0.2–1.2)
Total Protein: 6.9 g/dL (ref 6.1–8.1)
eGFR: 83 mL/min/{1.73_m2} (ref >=60)

## 2021-03-07 LAB — LIPID PANEL
Cholesterol: 232 mg/dL — ABNORMAL HIGH (ref <200)
HDL: 102 mg/dL (ref >=50)
LDL Cholesterol (Calc): 107 mg/dL (calc) — ABNORMAL HIGH
Non-HDL Cholesterol (Calc): 130 mg/dL (calc) — ABNORMAL HIGH (ref <130)
Total CHOL/HDL Ratio: 2.3 (calc) (ref <5.0)
Triglycerides: 115 mg/dL (ref <150)

## 2021-03-07 LAB — TSH: TSH: 1.65 mIU/L (ref 0.40–4.50)

## 2021-03-07 LAB — MAGNESIUM: Magnesium: 2.1 mg/dL (ref 1.5–2.5)

## 2021-03-07 NOTE — Patient Instructions (Addendum)
Goals      Exercise 150 min/wk Moderate Activity     LDL CALC < 100     Weight (lb) < 180 lb (81.6 kg)         HOW TO SCHEDULE A BONE DENSITY EXAM   [facility]  7 a.m.-6:30 p.m., Monday 7 a.m.-5 p.m., Tuesday-Friday Schedule an appointment by calling [phone].      Zoster Vaccine, Recombinant injection What is this medication? ZOSTER VACCINE (ZOS ter vak SEEN) is a vaccine used to reduce the risk of getting shingles. This vaccine is not used to treat shingles or nerve pain from shingles. This medicine may be used for other purposes; ask your health care provider or pharmacist if you have questions. COMMON BRAND NAME(S): [facility] What should I tell my care team before I take this medication? They need to know if you have any of these conditions: cancer immune system problems an unusual or allergic reaction to Zoster vaccine, other medications, foods, dyes, or preservatives pregnant or trying to get pregnant breast-feeding How should I use this medication? This vaccine is injected into a muscle. It is given by a health care provider. A copy of Vaccine Information Statements will be given before each vaccination. Be sure to read this information carefully each time. This sheet may change often. Talk to your health care provider about the use of this vaccine in children. This vaccine is not approved for use in children. Overdosage: If you think you have taken too much of this medicine contact a poison control center or emergency room at once. NOTE: This medicine is only for you. Do not share this medicine with others. What if I miss a dose? Keep appointments for follow-up (booster) doses. It is important not to miss your dose. Call your health care provider if you are unable to keep an appointment. What may interact with this medication? medicines that suppress your immune system medicines to treat cancer steroid medicines like prednisone or  cortisone This list may not describe all possible interactions. Give your health care provider a list of all the medicines, herbs, non-prescription drugs, or dietary supplements you use. Also tell them if you smoke, drink alcohol, or use illegal drugs. Some items may interact with your medicine. What should I watch for while using this medication? Visit your health care provider regularly. This vaccine, like all vaccines, may not fully protect everyone. What side effects may I notice from receiving this medication? Side effects that you should report to your doctor or health care professional as soon as possible: allergic reactions (skin rash, itching or hives; swelling of the face, lips, or tongue) trouble breathing Side effects that usually do not require medical attention (report these to your doctor or health care professional if they continue or are bothersome): chills headache fever nausea pain, redness, or irritation at site where injected tiredness vomiting This list may not describe all possible side effects. Call your doctor for medical advice about side effects. You may report side effects to FDA at 1-800-FDA-1088. Where should I keep my medication? This vaccine is only given by a health care provider. It will not be stored at home. NOTE: This sheet is a summary. It may not cover all possible information. If you have questions about this medicine, talk to your doctor, pharmacist, or health care provider.  2022 [employer] Standard (2020-09-18 00:00:00)

## 2021-03-07 NOTE — Progress Notes (Signed)
MEDICARE ANNUAL WELLNESS VISIT AND FOLLOW UP  Assessment:   Diagnoses and all orders for this visit:  Annual Medicare Wellness Visit Due annually  Health maintenance reviewed  Check with insurance about shingrix DEXA ordered to schedule  Thoracic aortic aneurysm without rupture ([place]) Control BP, followed by Dr. Roxan Hockey Monitor annually  - last 09/2020  PVC's (premature ventricular contractions) Monitor, followed by Dr. Gwenlyn Found  Paroxysmal atrial fibrillation [facility]) Last documented remote; recently controlled post CV Continue to monitor closely  Essential hypertension Continue medication Monitor blood pressure at home; call if consistently over 130/80 Continue DASH diet.   Reminder to go to the ER if any CP, SOB, nausea, dizziness, severe HA, changes vision/speech, left arm numbness and tingling and jaw pain.  Chronic obstructive pulmonary disease, unspecified COPD type ([place]) Asymptomatic, former smokr Continue to monitor  Vitamin D deficiency Continue supplementation Check vitamin D level annually and as needed  Personal history of colonic polyp- adenoma Follow up colonoscopy was normal 10/2020, none further recommended by Dr. Carlean Purl Discuss options in 2032 if in good health   Overweight (BMI 25.0-29.9) Long discussion about weight loss, diet, and exercise Recommended diet heavy in fruits and veggies and low in animal meats, cheeses, and dairy products, appropriate calorie intake Discussed appropriate weight for height Follow up at next visit  Other abnormal glucose Recent A1Cs at goal Discussed diet/exercise, weight management  Defer A1C; check CMP  Medication management Continued  Hyperlipidemia Off of medications, hx of statin myalgia with low dose low frequency rosuvastatin, at goal LDL <100 by lifestyle  Continue low cholesterol diet and exercise.  Check lipid panel.   Anxiety and depression Well managed by current regimen;  She plans to  attempt celexa taper to 20 mg come spring/summer Will try slow taper down alprazolam from 1 mg to 0.5 mg after discussion of risks, receptive to this Stress management techniques discussed, increase water, good sleep hygiene discussed, increase exercise, and increase veggies.    Overweight BMI 25-29 Discussed dietary and exercise modifications   Osteoarthritis, bilateral Continue supplements, weight loss, regular gentle exercise  Orders Placed This Encounter  Procedures   DG Bone Density   CBC with Differential/Platelet   COMPLETE METABOLIC PANEL WITH GFR   Magnesium   Lipid panel   TSH      Over 30 minutes of face to face exam, counseling, chart review and critical decision making was performed Future Appointments  Date Time Provider [place]  07/30/2021  2:00 PM Unk Pinto, MD [facility] None     Plan:   During the course of the visit the patient was educated and counseled about appropriate screening and preventive services including:   Pneumococcal vaccine  Prevnar 13 Influenza vaccine Td vaccine Screening electrocardiogram Bone densitometry screening Colorectal cancer screening Diabetes screening Glaucoma screening Nutrition counseling  Advanced directives: requested   Subjective:  Taylor Li is a 71 y.o. female who presents for Medicare Annual Wellness Visit and 3 month follow up. She has Essential hypertension; Hyperlipidemia; Vitamin D deficiency; Other abnormal glucose; Paroxysmal Afib; Medication management; Personal history of colonic polyp- adenoma; Anxiety and depression; COPD (chronic obstructive pulmonary disease) ([place]); Overweight (BMI 25.0-29.9); PVC's (premature ventricular contractions); Thoracic aortic aneurysm; and Fatigue on their problem list.   She has remote pAfib in 2002 & 2004 (Req CV), she has history of atypical CP and was evaluated by Dr. Gwenlyn Found, Her 2D echo was entirely normal except for a dilated thoracic aorta  measuring 43 mm (has seen  Dr. Roxan Hockey, now annual monitoring here, last CTA 09/19/2020 showed stable at 4.3 cm).  A Myoview stress test was normal as well.  Her event monitor at that time showed PVCs.    she has a diagnosis of depression/anxiety and is currently on wellbutrin 300 mg and celexa 40 mg daily, PRN xanax, reports symptoms are fairly well controlled on current regimen. she currently takes 1 at night for sleep and this is working well, receptive to taper down after discussion of risks due to pharmacokinetics in older adults, increased risk of unintentional overdose with daily use.   BMI is Body mass index is 28.74 kg/m., she has been working on diet and exercise, likes to dance. Though her activity is impaired by her hands and feet at times. She is working to get down to <180lb, felt better with losing weight. Wt Readings from Last 3 Encounters:  03/07/21 189 lb (85.7 kg)  10/25/20 185 lb (83.9 kg)  10/17/20 185 lb 6 oz (84.1 kg)    Her blood pressure today their BP is BP: 120/82 She does not workout. She denies chest pain, shortness of breath, dizziness.   She is not on cholesterol medication, taking triple omega 3 supplement (rosuvastatin 5 mg three days a week)  Was taking alirocumab via cardiology, but cannot afford, severe myalgias with statin, even with rosuvastatin 5 mg 3 days a week).   Her cholesterol is at goal. The cholesterol last visit was:   Lab Results  Component Value Date   CHOL 176 07/30/2020   HDL 69 07/30/2020   LDLCALC 87 07/30/2020   TRIG 103 07/30/2020   CHOLHDL 2.6 07/30/2020    She has not been working on diet and exercise for glucose management, and denies foot ulcerations, increased appetite, nausea, paresthesia of the feet, polydipsia, polyuria and visual disturbances. Last A1C in the office was:  Lab Results  Component Value Date   HGBA1C 5.4 07/30/2020   Last GFR: Lab Results  Component Value Date   GFRNONAA 75 02/14/2020   Patient is newly  on Vitamin D supplement.   Lab Results  Component Value Date   VD25OH 78 07/30/2020      Medication Review: Current Outpatient Medications on File Prior to Visit  Medication Sig Dispense Refill   ALPRAZolam (XANAX) 1 MG tablet TAKE 1/2-1 TABLET 2-3 X /DAY ONLY IF NEEDED FOR PANIC OR ANXIETY ATTACK & LIMIT TO 5 DAYS /WEEK TO AVOID ADDICTION & DEMENTIA 60 tablet 0   Ascorbic Acid (VITAMIN C) 1000 MG tablet Take 1,000 mg by mouth daily.     aspirin 81 MG tablet Take 81 mg by mouth daily.     BLACK PEPPER-TURMERIC PO Take 1,000 mg by mouth daily.      buPROPion (WELLBUTRIN XL) 300 MG 24 hr tablet TAKE 1 TABLET DAILY FOR MOOD, FOCUS & CONCENTRATION 90 tablet 3   Cholecalciferol (VITAMIN D PO) Take 5,000 Units by mouth 2 (two) times daily.      citalopram (CELEXA) 40 MG tablet TAKE 1 TABLET BY MOUTH DAILY FOR MOOD 90 tablet 3   cyclobenzaprine (FLEXERIL) 10 MG tablet TAKE 1/2-1 TABLET BY MOUTH 2-3 TIMES DAILY AS NEEDED FOR MUSCLE SPASM 90 tablet 0   Magnesium 500 MG TABS Take by mouth. Takes 2 tablets daily     Multiple Vitamin (MULTIVITAMIN) tablet Take 1 tablet by mouth daily.     Omega-3 Fatty Acids (FISH OIL) 1000 MG CAPS Take by mouth daily.     Probiotic Product (PROBIOTIC  DAILY PO) Take 1 tablet by mouth daily.     telmisartan (MICARDIS) 20 MG tablet Take  1 tablet  Daily  for BP 90 tablet 3   No current facility-administered medications on file prior to visit.    Allergies  Allergen Reactions   Rosuvastatin     myalgias    Current Problems (verified) Patient Active Problem List   Diagnosis Date Noted   Fatigue 02/16/2020   PVC's (premature ventricular contractions) 09/04/2017   Thoracic aortic aneurysm 09/04/2017   Overweight (BMI 25.0-29.9) 03/10/2017   COPD (chronic obstructive pulmonary disease) ([place]) 09/29/2016   Anxiety and depression 12/06/2013   Personal history of colonic polyp- adenoma 09/22/2013   Medication management 09/09/2013   Essential hypertension  01/14/2013   Hyperlipidemia 01/14/2013   Vitamin D deficiency 01/14/2013   Other abnormal glucose 01/14/2013   Paroxysmal Afib 01/14/2013    Screening Tests Immunization History  Administered Date(s) Administered   DTaP 01/13/2001   Influenza Inj Mdck Quad With Preservative 09/30/2016   Influenza Whole 10/06/2012   Influenza, High Dose Seasonal PF 10/15/2017, 10/24/2019   Influenza, Quadrivalent, Recombinant, Inj, Pf 10/15/2018   Influenza,inj,Quad PF,6+ Mos 10/27/2020   Influenza,inj,quad, With Preservative 10/18/2015   Influenza-Unspecified 11/11/2013, 11/15/2014   PFIZER Comirnaty(Gray Top)Covid-19 Tri-Sucrose Vaccine 05/15/2020   PFIZER(Purple Top)SARS-COV-2 Vaccination 02/25/2019, 03/22/2019, 10/24/2019   PPD Test 01/14/2013, 04/17/2014, 04/16/2015, 05/15/2016   Pfizer Covid-19 Vaccine Bivalent Booster 86yrs & up 10/27/2020   Pneumococcal Conjugate-13 06/15/2017   Pneumococcal Polysaccharide-23 01/13/2001, 02/01/2019   Tdap 10/18/2015   Zoster, Live 11/19/2009   Health Maintenance  Topic Date Due   Zoster Vaccines- Shingrix (1 of 2) Never done   MAMMOGRAM  04/16/2015   TETANUS/TDAP  10/17/2025   COLONOSCOPY (Pts 45-76yrs Insurance coverage will need to be confirmed)  10/26/2030   Pneumonia Vaccine 12+ Years old  Completed   INFLUENZA VACCINE  Completed   DEXA SCAN  Completed   COVID-19 Vaccine  Completed   Hepatitis C Screening  Completed   HPV VACCINES  Aged Out    Preventative care: Last colonoscopy: 09/2013 polyp, repeat 10/2020 by Dr. Carlean Purl was normal, recommended no further. Discuss cologuard in 2032.  Last mammogram: has implants, last 2015, was advised no further mammograms, to get breast MRI if any concerns, doing regular self exams Last pap smear/pelvic exam: hysterectomy   DEXA: 2011, patient req to repeat screening this year, order placed   Shringrix: Discussed vaccination  Names of Other Physician/Practitioners you currently use: 1. [facility] here for primary care 2. [place], eye doctor, last visit 2000, Due for 2021 3. Dr, Lavonne Chick dentist, last visit 2021  Patient Care Team: Unk Pinto, MD as PCP - General (Internal Medicine) [name], MD as PCP - Cardiology (Cardiology)  SURGICAL HISTORY She  has a past surgical history that includes Laminectomy (1982); Breast surgery (Bilateral, 1983); Vaginal hysterectomy (1985); Tonsillectomy; Nasal septum surgery; Cardioversion; Appendectomy; Cholecystectomy; and Colonoscopy. FAMILY HISTORY Her family history includes Atrial fibrillation in her mother; Barrett's esophagus in her mother; Clotting disorder in her father; Hypertension in her father and mother; Lung cancer in her father; Mitral valve prolapse in her mother; Thyroid disease in her father. SOCIAL HISTORY She  reports that she quit smoking about 8 years ago. Her smoking use included cigarettes. She has never used smokeless tobacco. She reports current alcohol use. She reports that she does not use drugs.   MEDICARE WELLNESS OBJECTIVES: Physical activity:   Cardiac  risk factors:   Depression/mood screen:   Depression screen [facility] 2/9 10/05/2019  Decreased Interest 0  Down, Depressed, Hopeless 0  PHQ - 2 Score 0  Some recent data might be hidden    ADLs:  No flowsheet data found.    Cognitive Testing  Alert? Yes  Normal Appearance?Yes  Oriented to person? Yes  Place? Yes   Time? Yes  Recall of three objects?  Yes  Can perform simple calculations? Yes  Displays appropriate judgment?Yes  Can read the correct time from a watch face?Yes  EOL planning:    Review of Systems  Constitutional:  Negative for malaise/fatigue and weight loss.  HENT:  Negative for hearing loss and tinnitus.   Eyes:  Negative for blurred vision and double vision.  Respiratory:  Negative for cough, sputum production, shortness of breath and wheezing.   Cardiovascular:  Negative for chest  pain, palpitations, orthopnea, claudication, leg swelling and PND.  Gastrointestinal:  Negative for abdominal pain, blood in stool, constipation, diarrhea, heartburn, melena, nausea and vomiting.  Genitourinary: Negative.   Musculoskeletal:  Positive for joint pain (intermittent hands and feet). Negative for falls and myalgias.  Skin:  Negative for rash.  Neurological:  Negative for dizziness, tingling, sensory change, weakness and headaches.  Endo/Heme/Allergies:  Negative for polydipsia.  Psychiatric/Behavioral: Negative.  Negative for depression (in remission on meds), memory loss, substance abuse and suicidal ideas. The patient is not nervous/anxious and does not have insomnia (controlled with xanax).   All other systems reviewed and are negative.   Objective:     Today's Vitals   03/07/21 1406  BP: 120/82  Pulse: 72  Temp: (!) 97.3 F (36.3 C)  SpO2: 98%  Weight: 189 lb (85.7 kg)   Body mass index is 28.74 kg/m.  General appearance: alert, no distress, WD/WN, female HEENT: normocephalic, sclerae anicteric, TMs pearly, nares patent, no discharge or erythema, pharynx normal Oral cavity: MMM, no lesions Neck: supple, no lymphadenopathy, no thyromegaly, no masses Heart: RRR, normal S1, S2, no murmurs Lungs: CTA bilaterally, no wheezes, rhonchi, or rales Abdomen: +bs, soft, non tender, non distended, no masses, no hepatomegaly, no splenomegaly Musculoskeletal: nontender, no swelling, DIP and PIP joint enlargment, tenderness and erythema to left 2nd digit.  Extremities: no edema, no cyanosis, no clubbing Pulses: 2+ symmetric, upper and lower extremities, normal cap refill Neurological: alert, oriented x 3, CN2-12 intact, strength normal upper extremities and lower extremities, sensation normal throughout, DTRs 2+ throughout, no cerebellar signs, gait normal Psychiatric: normal affect, behavior normal, pleasant   Medicare Attestation I have personally reviewed: The patient's  medical and social history Their use of alcohol, tobacco or illicit drugs Their current medications and supplements The patient's functional ability including ADLs,fall risks, home safety risks, cognitive, and hearing and visual impairment Diet and physical activities Evidence for depression or mood disorders  The patient's weight, height, BMI, and visual acuity have been recorded in the chart.  I have made referrals, counseling, and provided education to the patient based on review of the above and I have provided the patient with a written personalized care plan for preventive services.     Izora Ribas, NP   03/07/2021

## 2021-04-01 ENCOUNTER — Other Ambulatory Visit: Payer: Self-pay | Admitting: Internal Medicine

## 2021-04-01 ENCOUNTER — Encounter: Payer: Self-pay | Admitting: Internal Medicine

## 2021-04-01 DIAGNOSIS — H6590 Unspecified nonsuppurative otitis media, unspecified ear: Secondary | ICD-10-CM

## 2021-04-01 DIAGNOSIS — H9190 Unspecified hearing loss, unspecified ear: Secondary | ICD-10-CM

## 2021-04-03 ENCOUNTER — Other Ambulatory Visit: Payer: PPO

## 2021-04-04 ENCOUNTER — Other Ambulatory Visit: Payer: Self-pay | Admitting: Internal Medicine

## 2021-04-04 DIAGNOSIS — F411 Generalized anxiety disorder: Secondary | ICD-10-CM

## 2021-04-09 ENCOUNTER — Other Ambulatory Visit: Payer: Self-pay | Admitting: Internal Medicine

## 2021-05-04 ENCOUNTER — Other Ambulatory Visit: Payer: Self-pay | Admitting: Internal Medicine

## 2021-05-04 DIAGNOSIS — I10 Essential (primary) hypertension: Secondary | ICD-10-CM

## 2021-05-28 ENCOUNTER — Encounter: Payer: Medicare Other | Admitting: Internal Medicine

## 2021-06-06 ENCOUNTER — Other Ambulatory Visit: Payer: Self-pay | Admitting: Nurse Practitioner

## 2021-06-06 DIAGNOSIS — F411 Generalized anxiety disorder: Secondary | ICD-10-CM

## 2021-07-30 ENCOUNTER — Ambulatory Visit (INDEPENDENT_AMBULATORY_CARE_PROVIDER_SITE_OTHER): Payer: PPO | Admitting: Internal Medicine

## 2021-07-30 ENCOUNTER — Encounter: Payer: Self-pay | Admitting: Internal Medicine

## 2021-07-30 VITALS — BP 120/86 | HR 78 | Temp 96.8°F | Ht 68.0 in | Wt 183.0 lb

## 2021-07-30 DIAGNOSIS — R7309 Other abnormal glucose: Secondary | ICD-10-CM | POA: Diagnosis not present

## 2021-07-30 DIAGNOSIS — I10 Essential (primary) hypertension: Secondary | ICD-10-CM | POA: Diagnosis not present

## 2021-07-30 DIAGNOSIS — I712 Thoracic aortic aneurysm, without rupture, unspecified: Secondary | ICD-10-CM | POA: Diagnosis not present

## 2021-07-30 DIAGNOSIS — E559 Vitamin D deficiency, unspecified: Secondary | ICD-10-CM | POA: Diagnosis not present

## 2021-07-30 DIAGNOSIS — Z79899 Other long term (current) drug therapy: Secondary | ICD-10-CM | POA: Diagnosis not present

## 2021-07-30 DIAGNOSIS — E782 Mixed hyperlipidemia: Secondary | ICD-10-CM | POA: Diagnosis not present

## 2021-07-30 DIAGNOSIS — Z87891 Personal history of nicotine dependence: Secondary | ICD-10-CM

## 2021-07-30 DIAGNOSIS — Z8249 Family history of ischemic heart disease and other diseases of the circulatory system: Secondary | ICD-10-CM

## 2021-07-30 DIAGNOSIS — Z136 Encounter for screening for cardiovascular disorders: Secondary | ICD-10-CM

## 2021-07-30 DIAGNOSIS — I480 Paroxysmal atrial fibrillation: Secondary | ICD-10-CM

## 2021-07-30 DIAGNOSIS — R5383 Other fatigue: Secondary | ICD-10-CM | POA: Diagnosis not present

## 2021-07-30 DIAGNOSIS — Z1211 Encounter for screening for malignant neoplasm of colon: Secondary | ICD-10-CM

## 2021-07-30 NOTE — Patient Instructions (Signed)

## 2021-07-30 NOTE — Progress Notes (Signed)
Comprehensive Evaluation &  Examination  Future Appointments  Date Time Provider Department  07/30/2021                   cpe  2:00 PM Unk Pinto, MD [facility]  03/12/2022                  wellness  2:30 PM Darrol Jump, NP [facility]  08/04/2022                   cpe  2:00 PM Unk Pinto, MD [facility]        This very nice [name] presents for a Screening /Preventative Visit & comprehensive evaluation and management of multiple medical co-morbidities.  Patient has been followed for HTN, pAfib, HLD, Prediabetes  and Vitamin D Deficiency.        HTN predates circa 2008.  Patient has remote hx /o CV x 2 for pAfib in 2002 & 2004. Currently she's followed by Dr Gwenlyn Found with a negative Stress Myoview in 2009.  Patient also is followed by Dr Roxan Hockey for a for a dilated Ascending Thoracic Aorta (43 mm).  Patient's BP has been controlled at home and patient denies any cardiac symptoms as chest pain, palpitations, shortness of breath, dizziness or ankle swelling.  BP  has been controlled at home and patient denies any cardiac symptoms as chest pain, palpitations, shortness of breath, dizziness or ankle swelling. Today's BP is at goal - 120/86 .       Patient's hyperlipidemia is not controlled with diet . Last lipids were not at goal :  Lab Results  Component Value Date   CHOL 232 (H) 03/07/2021   HDL 102 03/07/2021   LDLCALC 107 (H) 03/07/2021   TRIG 115 03/07/2021   CHOLHDL 2.3 03/07/2021         Patient has hx/o prediabetes predating A1c 5.8% /2011 &  5.9% /2011) and patient denies reactive hypoglycemic symptoms, visual blurring, diabetic polys or paresthesias. Last A1c was normal & at goal :  Lab Results  Component Value Date   HGBA1C 5.4 07/30/2020         Finally, patient has history of Vitamin D Deficiency ("36 " /2010) and last Vitamin D was at goal :  Lab Results  Component Value Date   VD25OH 78 07/30/2020       Current Outpatient Medications:     ALPRAZolam (XANAX) 1 MG tablet, TAKE 1/2-1 TABLET 2-3 TIMES A DAY ONLY IF NEEDED FOR PANIC OR ANXIETY ATTACK & LIMIT TO 5 DAYS /WEEK TO AVOID ADDICTION & DEMENTIA, Disp: 60 tablet, Rfl: 0   Ascorbic Acid (VITAMIN C) 1000 MG tablet, Take 1,000 mg by mouth daily., Disp: , Rfl:    aspirin 81 MG tablet, Take 81 mg by mouth daily., Disp: , Rfl:    BLACK PEPPER-TURMERIC PO, Take 1,000 mg by mouth daily. , Disp: , Rfl:    buPROPion (WELLBUTRIN XL) 300 MG 24 hr tablet, TAKE 1 TABLET DAILY FOR MOOD, FOCUS & CONCENTRATION, Disp: 90 tablet, Rfl: 3   Capsicum, Cayenne, (CAYENNE PEPPER PO), Take by mouth. Takes 1/4 tsp, 3 times per day, Disp: , Rfl:    Cholecalciferol (VITAMIN D PO), Take 5,000 Units by mouth 2 (two) times daily. , Disp: , Rfl:    citalopram (CELEXA) 40 MG tablet, TAKE 1 TABLET BY MOUTH DAILY FOR MOOD, Disp: 90 tablet, Rfl: 3   cyclobenzaprine (FLEXERIL) 10 MG tablet, Take  1/2 to 1 tablet  2 to 3 x /day  as needed for Muscle Spasm                        /                     TAKE                 BY                  MOUTH, Disp: 90 tablet, Rfl: 0   Magnesium 500 MG TABS, Take by mouth. Takes 2 tablets daily, Disp: , Rfl:    Multiple Vitamin (MULTIVITAMIN) tablet, Take 1 tablet by mouth daily., Disp: , Rfl:    Omega-3 Fatty Acids (FISH OIL) 1000 MG CAPS, Take by mouth daily., Disp: , Rfl:    Probiotic Product (PROBIOTIC DAILY PO), Take 1 tablet by mouth daily., Disp: , Rfl:    telmisartan (MICARDIS) 20 MG tablet, TAKE 1 TABLET DAILY FOR BLOOD PRESSURE, Disp: 90 tablet, Rfl: 3    Allergies  Allergen Reactions   Rosuvastatin     myalgias     Past Medical History:  Diagnosis Date   A-fib (HCC)    Arthritis    C. difficile diarrhea    Hyperlipidemia    Hypertension    Personal history of colonic polyp- adenoma 09/22/2013   Pre-diabetes    Vitamin D deficiency      Health Maintenance  Topic Date Due   Zoster Vaccines- Shingrix (1 of 2) Never done   MAMMOGRAM  04/16/2015    INFLUENZA VACCINE  08/13/2020   COVID-19 Vaccine (5 - Booster for Pfizer series) 09/15/2020   TETANUS/TDAP  10/17/2025   DEXA SCAN  Completed   Hepatitis C Screening  Completed   PNA vac Low Risk Adult  Completed   HPV VACCINES  Aged Out     Immunization History  Administered Date(s) Administered   DTaP 01/13/2001   Influenza Inj Mdck Quad  09/30/2016   Influenza Whole 10/06/2012   Influenza, High Dose  10/15/2017, 10/24/2019   Influenza, Quadrivalent 10/15/2018   Influenza,inj,quad 10/18/2015   Influenza 11/11/2013, 11/15/2014   PFIZER Covid-19 Tri-Sucrose Vacc 05/15/2020   PFIZER-SARS-COV-2 Vacc 02/25/2019, 03/22/2019, 10/24/2019   PPD Test 04/16/2015, 05/15/2016   Pneumococcal -13 06/15/2017   Pneumococcal -23 01/13/2001, 02/01/2019   Tdap 10/18/2015   Zoster, Live 11/19/2009     Last Colon - 09/16/2013 - Dr Carlean Purl - polyps - Recommended repeat Colon - due 2025.    Last MGM - 04/15/2013 [employer] - Implants -  s/p bilat subcut resections     Past Surgical History:  Procedure Laterality Date   APPENDECTOMY     BREAST SURGERY Bilateral 1983   SQ mastectomies   CARDIOVERSION     CHOLECYSTECTOMY     COLONOSCOPY     LAMINECTOMY  1982   L4-L5   NASAL SEPTUM SURGERY     TONSILLECTOMY     VAGINAL HYSTERECTOMY  1985    Social History   Tobacco Use   Smoking status: Former    Types: Cigarettes    Quit date: 04/18/2012    Years since quitting: 8.2   Smokeless tobacco: Former   Tobacco comments:    e-cigarettes  Substance Use Topics   Alcohol use: Not Currently   Drug use: No      ROS Constitutional: Denies fever, chills, weight loss/gain, headaches, insomnia,  night sweats, and change in appetite.  Does c/o fatigue. Eyes: Denies redness, blurred vision, diplopia, discharge, itchy, watery eyes.  ENT: Denies discharge, congestion, post nasal drip, epistaxis, sore throat, earache, hearing loss, dental pain, Tinnitus, Vertigo, Sinus pain, snoring.  Cardio: Denies  chest pain, palpitations, irregular heartbeat, syncope, dyspnea, diaphoresis, orthopnea, PND, claudication, edema Respiratory: denies cough, dyspnea, DOE, pleurisy, hoarseness, laryngitis, wheezing.  Gastrointestinal: Denies dysphagia, heartburn, reflux, water brash, pain, cramps, nausea, vomiting, bloating, diarrhea, constipation, hematemesis, melena, hematochezia, jaundice, hemorrhoids Genitourinary: Denies dysuria, frequency, urgency, nocturia, hesitancy, discharge, hematuria, flank pain Breast: Breast lumps, nipple discharge, bleeding.  Musculoskeletal: Denies arthralgia, myalgia, stiffness, Jt. Swelling, pain, limp, and strain/sprain. Denies falls. Skin: Denies puritis, rash, hives, warts, acne, eczema, changing in skin lesion Neuro: No weakness, tremor, incoordination, spasms, paresthesia, pain Psychiatric: Denies confusion, memory loss, sensory loss. Denies Depression. Endocrine: Denies change in weight, skin, hair change, nocturia, and paresthesia, diabetic polys, visual blurring, hyper / hypo glycemic episodes.  Heme/Lymph: No excessive bleeding, bruising, enlarged lymph nodes.  Physical Exam  BP 120/86   Pulse 78   Temp (!) 96.8 F (36 C)   Ht '5\' 8"'$  (1.727 m)   Wt 183 lb (83 kg)   SpO2 96%   BMI 27.83 kg/m   General Appearance: Well nourished, well groomed and in no apparent distress.  Eyes: PERRLA, EOMs, conjunctiva no swelling or erythema, normal fundi and vessels. Sinuses: No frontal/maxillary tenderness ENT/Mouth: EACs patent / TMs  nl. Nares clear without erythema, swelling, mucoid exudates. Oral hygiene is good. No erythema, swelling, or exudate. Tongue normal, non-obstructing. Tonsils not swollen or erythematous. Hearing normal.  Neck: Supple, thyroid not palpable. No bruits, nodes or JVD. Respiratory: Respiratory effort normal.  BS equal and clear bilateral without rales, rhonci, wheezing or stridor. Cardio: Heart sounds are normal with regular rate and rhythm and no  murmurs, rubs or gallops. Peripheral pulses are normal and equal bilaterally without edema. No aortic or femoral bruits. Chest: symmetric with normal excursions and percussion. Breasts: Deferred   Abdomen: Flat, soft with bowel sounds active. Nontender, no guarding, rebound, hernias, masses, or organomegaly.  Musculoskeletal: Full ROM all peripheral extremities, joint stability, 5/5 strength, and normal gait. Skin: Warm and dry without rashes, lesions, cyanosis, clubbing or  ecchymosis.  Neuro: Cranial nerves intact, reflexes equal bilaterally. Normal muscle tone, no cerebellar symptoms. Sensation intact.  Pysch: Alert and oriented X 3, normal affect, Insight and Judgment appropriate.    Assessment and Plan  1. Essential hypertension  - EKG 12-Lead - Urinalysis, Routine w reflex microscopic - Microalbumin / creatinine urine ratio - CBC with Differential/Platelet - COMPLETE METABOLIC PANEL WITH GFR - Magnesium - TSH  2. Hyperlipidemia, mixed  - EKG 12-Lead - Lipid panel - TSH  3. Abnormal glucose  - EKG 12-Lead - Hemoglobin A1c - Insulin, random  4. Vitamin D deficiency  - VITAMIN D 25 Hydroxy   5. Thoracic aortic aneurysm (HCC)  - Lipid panel  6. Paroxysmal atrial fibrillation (HCC)  - TSH  7. Screening for colorectal cancer  - POC Hemoccult Bld/Stl   8. Screening for heart disease  - EKG 12-Lead  9. FHx: hypertension  - EKG 12-Lead  10. Former smoker  - EKG 12-Lead  11. Fatigue, unspecified type   12. Medication management  - Urinalysis, Routine w reflex microscopic - Microalbumin / creatinine urine ratio - CBC with Differential/Platelet - COMPLETE METABOLIC PANEL WITH GFR - Magnesium - Lipid panel - TSH - Hemoglobin A1c - Insulin, random - VITAMIN D 25 Hydroxy  Patient was counseled in prudent diet to achieve/maintain BMI less than 25 for weight control, BP monitoring, regular exercise and medications. Discussed med's effects  and SE's. Screening labs and tests as requested with regular follow-up as recommended. Over 40 minutes of exam, counseling, chart review and high complex critical decision making was performed.   Kirtland Bouchard, MD

## 2021-07-31 ENCOUNTER — Other Ambulatory Visit: Payer: Self-pay | Admitting: Internal Medicine

## 2021-07-31 LAB — CBC WITH DIFFERENTIAL/PLATELET
Absolute Monocytes: 553 cells/uL (ref 200–950)
Basophils Absolute: 17 cells/uL (ref 0–200)
Basophils Relative: 0.2 %
Eosinophils Absolute: 102 cells/uL (ref 15–500)
Eosinophils Relative: 1.2 %
HCT: 44.9 % (ref 35.0–45.0)
Hemoglobin: 15.4 g/dL (ref 11.7–15.5)
Lymphs Abs: 2380 cells/uL (ref 850–3900)
MCH: 30.9 pg (ref 27.0–33.0)
MCHC: 34.3 g/dL (ref 32.0–36.0)
MCV: 90 fL (ref 80.0–100.0)
MPV: 11.7 fL (ref 7.5–12.5)
Monocytes Relative: 6.5 %
Neutro Abs: 5449 cells/uL (ref 1500–7800)
Neutrophils Relative %: 64.1 %
Platelets: 275 10*3/uL (ref 140–400)
RBC: 4.99 10*6/uL (ref 3.80–5.10)
RDW: 12.5 % (ref 11.0–15.0)
Total Lymphocyte: 28 %
WBC: 8.5 10*3/uL (ref 3.8–10.8)

## 2021-07-31 LAB — URINALYSIS, ROUTINE W REFLEX MICROSCOPIC
Bacteria, UA: NONE SEEN /HPF
Bilirubin Urine: NEGATIVE
Glucose, UA: NEGATIVE
Hgb urine dipstick: NEGATIVE
Hyaline Cast: NONE SEEN /LPF
Ketones, ur: NEGATIVE
Leukocytes,Ua: NEGATIVE
Nitrite: NEGATIVE
RBC / HPF: NONE SEEN /HPF (ref 0–2)
Specific Gravity, Urine: 1.019 (ref 1.001–1.035)
Squamous Epithelial / HPF: NONE SEEN /HPF (ref <=5)
WBC, UA: NONE SEEN /HPF (ref 0–5)
pH: 6.5 (ref 5.0–8.0)

## 2021-07-31 LAB — COMPLETE METABOLIC PANEL WITH GFR
AG Ratio: 1.6 (calc) (ref 1.0–2.5)
ALT: 20 U/L (ref 6–29)
AST: 16 U/L (ref 10–35)
Albumin: 4.5 g/dL (ref 3.6–5.1)
Alkaline phosphatase (APISO): 79 U/L (ref 37–153)
BUN: 14 mg/dL (ref 7–25)
CO2: 27 mmol/L (ref 20–32)
Calcium: 9.8 mg/dL (ref 8.6–10.4)
Chloride: 103 mmol/L (ref 98–110)
Creat: 0.82 mg/dL (ref 0.50–1.05)
Globulin: 2.8 g/dL (calc) (ref 1.9–3.7)
Glucose, Bld: 93 mg/dL (ref 65–99)
Potassium: 4.4 mmol/L (ref 3.5–5.3)
Sodium: 140 mmol/L (ref 135–146)
Total Bilirubin: 0.8 mg/dL (ref 0.2–1.2)
Total Protein: 7.3 g/dL (ref 6.1–8.1)
eGFR: 77 mL/min/{1.73_m2} (ref >=60)

## 2021-07-31 LAB — MICROALBUMIN / CREATININE URINE RATIO
Creatinine, Urine: 166 mg/dL (ref 20–275)
Microalb Creat Ratio: 3 mcg/mg creat (ref <30)
Microalb, Ur: 0.5 mg/dL

## 2021-07-31 LAB — VITAMIN D 25 HYDROXY (VIT D DEFICIENCY, FRACTURES): Vit D, 25-Hydroxy: 72 ng/mL (ref 30–100)

## 2021-07-31 LAB — HEMOGLOBIN A1C
Hgb A1c MFr Bld: 5.4 % of total Hgb (ref <5.7)
Mean Plasma Glucose: 108 mg/dL
eAG (mmol/L): 6 mmol/L

## 2021-07-31 LAB — INSULIN, RANDOM: Insulin: 4.8 u[IU]/mL

## 2021-07-31 LAB — MAGNESIUM: Magnesium: 2.1 mg/dL (ref 1.5–2.5)

## 2021-07-31 LAB — LIPID PANEL
Cholesterol: 213 mg/dL — ABNORMAL HIGH (ref <200)
HDL: 93 mg/dL (ref >=50)
LDL Cholesterol (Calc): 100 mg/dL (calc) — ABNORMAL HIGH
Non-HDL Cholesterol (Calc): 120 mg/dL (calc) (ref <130)
Total CHOL/HDL Ratio: 2.3 (calc) (ref <5.0)
Triglycerides: 104 mg/dL (ref <150)

## 2021-07-31 LAB — MICROSCOPIC MESSAGE

## 2021-07-31 LAB — TSH: TSH: 1.14 mIU/L (ref 0.40–4.50)

## 2021-07-31 MED ORDER — EZETIMIBE 10 MG PO TABS: ORAL_TABLET | ORAL | 3 refills | Status: DC

## 2021-07-31 NOTE — Progress Notes (Signed)
<><><><><><><><><><><><><><><><><><><><><><><><><><><><><><><><><> <><><><><><><><><><><><><><><><><><><><><><><><><><><><><><><><><> - Test results slightly outside the reference range are not unusual. If there is anything important, I will review this with you,  otherwise it is considered normal test values.  If you have further questions,  please do not hesitate to contact me at the office or via My Chart.  <><><><><><><><><><><><><><><><><><><><><><><><><><><><><><><><><> <><><><><><><><><><><><><><><><><><><><><><><><><><><><><><><><><>  -  Total  Chol =    213    - Elevated             (  Ideal  or  Goal is less than 180  !  )  & -  Bad / Dangerous LDL  Chol = 100   - also Elevated              (  Ideal  or  Goal is less than 70  !  )   - Recommend a stricter low cholesterol diet   - Cholesterol only comes from animal sources                                                                       - ie. meat, dairy, egg yolks  - Eat all the vegetables you want.  - Avoid Meat, Avoid Meat,  Avoid Meat                                                      - especially Red Meat - Beef AND Pork .  - Avoid cheese & dairy - milk & ice cream.     - Cheese is the most concentrated form of trans-fats which                                                      is the worst thing to clog up our arteries.   - Veggie cheese is OK which can be found in the fresh                     produce section at [employer] or [employer] or [employer]  - Also, sent in Rx Zetia  (Ezetimibe) (not a statin)     to help lower Cholesterol                                                                                                                                                                                   <><><><><><><><><><><><><><><><><><><><><><><><><><><><><><><><><> <><><><><><><><><><><><><><><><><><><><><><><><><><><><><><><><><>  -  A1c - Normal - No Diabetes - Great  ! <><><><><><><><><><><><><><><><><><><><><><><><><><><><><><><><><>  -  Vitamin D = 72 - Excellent  - Please keep dose same  <><><><><><><><><><><><><><><><><><><><><><><><><><><><><><><><><>  -  All Else - CBC - Kidneys - Electrolytes - Liver - Magnesium & Thyroid    - all  Normal / OK <><><><><><><><><><><><><><><><><><><><><><><><><><><><><><><><><> <><><><><><><><><><><><><><><><><><><><><><><><><><><><><><><><><>

## 2021-08-03 ENCOUNTER — Encounter: Payer: Self-pay | Admitting: Internal Medicine

## 2021-08-15 ENCOUNTER — Encounter: Payer: Self-pay | Admitting: Internal Medicine

## 2021-08-16 ENCOUNTER — Other Ambulatory Visit: Payer: Self-pay | Admitting: Nurse Practitioner

## 2021-08-16 DIAGNOSIS — F411 Generalized anxiety disorder: Secondary | ICD-10-CM

## 2021-08-16 MED ORDER — ALPRAZOLAM 1 MG PO TABS: ORAL_TABLET | ORAL | 0 refills | Status: DC

## 2021-09-18 ENCOUNTER — Other Ambulatory Visit: Payer: PPO

## 2021-10-08 ENCOUNTER — Other Ambulatory Visit: Payer: Self-pay | Admitting: Nurse Practitioner

## 2021-10-08 ENCOUNTER — Other Ambulatory Visit: Payer: Self-pay | Admitting: Internal Medicine

## 2021-10-08 DIAGNOSIS — F411 Generalized anxiety disorder: Secondary | ICD-10-CM

## 2021-10-09 ENCOUNTER — Encounter: Payer: Self-pay | Admitting: Internal Medicine

## 2021-10-18 ENCOUNTER — Encounter: Payer: Self-pay | Admitting: Internal Medicine

## 2021-10-30 ENCOUNTER — Ambulatory Visit: Payer: PPO | Admitting: Nurse Practitioner

## 2021-11-18 ENCOUNTER — Ambulatory Visit: Payer: PPO | Admitting: Nurse Practitioner

## 2021-12-02 ENCOUNTER — Other Ambulatory Visit: Payer: Self-pay | Admitting: Nurse Practitioner

## 2021-12-02 ENCOUNTER — Telehealth: Payer: Self-pay | Admitting: Nurse Practitioner

## 2021-12-02 MED ORDER — BUPROPION HCL ER (XL) 300 MG PO TB24: ORAL_TABLET | ORAL | 3 refills | Status: DC

## 2021-12-02 NOTE — Telephone Encounter (Signed)
Patient is requesting a refill on Bupropion to [facility] in [place]

## 2021-12-09 ENCOUNTER — Encounter: Payer: Self-pay | Admitting: Nurse Practitioner

## 2021-12-09 ENCOUNTER — Ambulatory Visit (INDEPENDENT_AMBULATORY_CARE_PROVIDER_SITE_OTHER): Payer: PPO | Admitting: Nurse Practitioner

## 2021-12-09 ENCOUNTER — Ambulatory Visit: Payer: PPO | Admitting: Nurse Practitioner

## 2021-12-09 ENCOUNTER — Other Ambulatory Visit: Payer: Self-pay | Admitting: Nurse Practitioner

## 2021-12-09 VITALS — BP 122/78 | HR 61 | Temp 97.9°F | Ht 68.0 in | Wt 189.4 lb

## 2021-12-09 DIAGNOSIS — I480 Paroxysmal atrial fibrillation: Secondary | ICD-10-CM

## 2021-12-09 DIAGNOSIS — I10 Essential (primary) hypertension: Secondary | ICD-10-CM | POA: Diagnosis not present

## 2021-12-09 DIAGNOSIS — E559 Vitamin D deficiency, unspecified: Secondary | ICD-10-CM

## 2021-12-09 DIAGNOSIS — J449 Chronic obstructive pulmonary disease, unspecified: Secondary | ICD-10-CM

## 2021-12-09 DIAGNOSIS — Z0000 Encounter for general adult medical examination without abnormal findings: Secondary | ICD-10-CM

## 2021-12-09 DIAGNOSIS — Z79899 Other long term (current) drug therapy: Secondary | ICD-10-CM

## 2021-12-09 DIAGNOSIS — Z86010 Personal history of colonic polyps: Secondary | ICD-10-CM

## 2021-12-09 DIAGNOSIS — M158 Other polyosteoarthritis: Secondary | ICD-10-CM | POA: Diagnosis not present

## 2021-12-09 DIAGNOSIS — E782 Mixed hyperlipidemia: Secondary | ICD-10-CM | POA: Diagnosis not present

## 2021-12-09 DIAGNOSIS — M25511 Pain in right shoulder: Secondary | ICD-10-CM

## 2021-12-09 DIAGNOSIS — R7309 Other abnormal glucose: Secondary | ICD-10-CM | POA: Diagnosis not present

## 2021-12-09 DIAGNOSIS — R6889 Other general symptoms and signs: Secondary | ICD-10-CM

## 2021-12-09 DIAGNOSIS — F419 Anxiety disorder, unspecified: Secondary | ICD-10-CM | POA: Diagnosis not present

## 2021-12-09 DIAGNOSIS — Z0001 Encounter for general adult medical examination with abnormal findings: Secondary | ICD-10-CM

## 2021-12-09 DIAGNOSIS — Z23 Encounter for immunization: Secondary | ICD-10-CM | POA: Diagnosis not present

## 2021-12-09 DIAGNOSIS — I4891 Unspecified atrial fibrillation: Secondary | ICD-10-CM

## 2021-12-09 DIAGNOSIS — E663 Overweight: Secondary | ICD-10-CM

## 2021-12-09 DIAGNOSIS — I493 Ventricular premature depolarization: Secondary | ICD-10-CM

## 2021-12-09 DIAGNOSIS — I712 Thoracic aortic aneurysm, without rupture, unspecified: Secondary | ICD-10-CM

## 2021-12-09 DIAGNOSIS — F32A Depression, unspecified: Secondary | ICD-10-CM

## 2021-12-09 DIAGNOSIS — Z136 Encounter for screening for cardiovascular disorders: Secondary | ICD-10-CM | POA: Diagnosis not present

## 2021-12-09 DIAGNOSIS — S46219A Strain of muscle, fascia and tendon of other parts of biceps, unspecified arm, initial encounter: Secondary | ICD-10-CM

## 2021-12-09 MED ORDER — DILTIAZEM HCL ER BEADS 240 MG PO CP24: 240.0000 mg | ORAL_CAPSULE | Freq: Every day | ORAL | 2 refills | Status: DC

## 2021-12-09 MED ORDER — APIXABAN 5 MG PO TABS: 5.0000 mg | ORAL_TABLET | Freq: Two times a day (BID) | ORAL | 3 refills | Status: DC

## 2021-12-09 NOTE — Patient Instructions (Signed)
Start Eliquis twice a day Start Diltiazem 240 mg daily Cardiac referral has been placed  Go to the ER if any chest pain, shortness of breath, nausea, dizziness, severe HA, changes vision/speech   Atrial Fibrillation  Atrial fibrillation is a type of heartbeat that is irregular or fast. If you have this condition, your heart beats without any order. This makes it hard for your heart to pump blood in a normal way. Atrial fibrillation may come and go, or it may become a long-lasting problem. If this condition is not treated, it can put you at higher risk for stroke, heart failure, and other heart problems. What are the causes? This condition may be caused by diseases that damage the heart. They include: High blood pressure. Heart failure. Heart valve disease. Heart surgery. Other causes include: Diabetes. Thyroid disease. Being overweight. Kidney disease. Sometimes the cause is not known. What increases the risk? You are more likely to develop this condition if: You are older. You smoke. You exercise often and very hard. You have a family history of this condition. You are a man. You use drugs. You drink a lot of alcohol. You have lung conditions, such as emphysema, pneumonia, or COPD. You have sleep apnea. What are the signs or symptoms? Common symptoms of this condition include: A feeling that your heart is beating very fast. Chest pain or discomfort. Feeling short of breath. Suddenly feeling light-headed or weak. Getting tired easily during activity. Fainting. Sweating. In some cases, there are no symptoms. How is this treated? Treatment for this condition depends on underlying conditions and how you feel when you have atrial fibrillation. They include: Medicines to: Prevent blood clots. Treat heart rate or heart rhythm problems. Using devices, such as a pacemaker, to correct heart rhythm problems. Doing surgery to remove the part of the heart that sends bad  signals. Closing an area where clots can form in the heart (left atrial appendage). In some cases, your doctor will treat other underlying conditions. Follow these instructions at home: Medicines Take over-the-counter and prescription medicines only as told by your doctor. Do not take any new medicines without first talking to your doctor. If you are taking blood thinners: Talk with your doctor before you take any medicines that have aspirin or NSAIDs, such as ibuprofen, in them. Take your medicine exactly as told by your doctor. Take it at the same time each day. Avoid activities that could hurt or bruise you. Follow instructions about how to prevent falls. Wear a bracelet that says you are taking blood thinners. Or, carry a card that lists what medicines you take. Lifestyle     Do not use any products that have nicotine or tobacco in them. These include cigarettes, e-cigarettes, and chewing tobacco. If you need help quitting, ask your doctor. Eat heart-healthy foods. Talk with your doctor about the right eating plan for you. Exercise regularly as told by your doctor. Do not drink alcohol. Lose weight if you are overweight. Do not use drugs, including cannabis. General instructions If you have a condition that causes breathing to stop for a short period of time (apnea), treat it as told by your doctor. Keep a healthy weight. Do not use diet pills unless your doctor says they are safe for you. Diet pills may make heart problems worse. Keep all follow-up visits as told by your doctor. This is important. Contact a doctor if: You notice a change in the speed, rhythm, or strength of your heartbeat. You are taking  a blood-thinning medicine and you get more bruising. You get tired more easily when you move or exercise. You have a sudden change in weight. Get help right away if:  You have pain in your chest or your belly (abdomen). You have trouble breathing. You have side effects of  blood thinners, such as blood in your vomit, poop (stool), or pee (urine), or bleeding that cannot stop. You have any signs of a stroke. "BE FAST" is an easy way to remember the main warning signs: B - Balance. Signs are dizziness, sudden trouble walking, or loss of balance. E - Eyes. Signs are trouble seeing or a change in how you see. F - Face. Signs are sudden weakness or loss of feeling in the face, or the face or eyelid drooping on one side. A - Arms. Signs are weakness or loss of feeling in an arm. This happens suddenly and usually on one side of the body. S - Speech. Signs are sudden trouble speaking, slurred speech, or trouble understanding what people say. T - Time. Time to call emergency services. Write down what time symptoms started. You have other signs of a stroke, such as: A sudden, very bad headache with no known cause. Feeling like you may vomit (nausea). Vomiting. A seizure. These symptoms may be an emergency. Do not wait to see if the symptoms will go away. Get medical help right away. Call your local emergency services (911 in the [place] Do not drive yourself to the hospital. Summary Atrial fibrillation is a type of heartbeat that is irregular or fast. You are at higher risk of this condition if you smoke, are older, have diabetes, or are overweight. Follow your doctor's instructions about medicines, diet, exercise, and follow-up visits. Get help right away if you have signs or symptoms of a stroke. Get help right away if you cannot catch your breath, or you have chest pain or discomfort. This information is not intended to replace advice given to you by your health care provider. Make sure you discuss any questions you have with your health care provider. Document Revised: 06/23/2018 Document Reviewed: 06/23/2018 [employer] Patient Education  [place].

## 2021-12-09 NOTE — Progress Notes (Signed)
MEDICARE ANNUAL WELLNESS VISIT AND FOLLOW UP  Assessment:   Diagnoses and all orders for this visit:  Annual Medicare Wellness Visit Due annually  Health maintenance reviewed  Thoracic aortic aneurysm without rupture (HCC) Control BP, followed by Dr. Roxan Hockey Monitor annually  - last 09/2020, she is due to have redone but will defer to cardiology to order once they have evaluated her A fib  PVC's (premature ventricular contractions) Monitor, followed by Dr. Gwenlyn Found  Paroxysmal atrial fibrillation [facility]) Last documented remote; recently controlled post CV Has not been to cardiology for several years Atrial fibrillation heard on auscultation and EKG confirms Started on Eliquis 5 mg BID, Diltiazem 240 mg QD- hold aspirin Referral placed to cardiology for further evaluation Go to the ER if any chest pain, shortness of breath, nausea, dizziness, severe HA, changes vision/speech   Essential hypertension Continue medication Monitor blood pressure at home; call if consistently over 130/80 Continue DASH diet.   Reminder to go to the ER if any CP, SOB, nausea, dizziness, severe HA, changes vision/speech, left arm numbness and tingling and jaw pain. - CBC  Chronic obstructive pulmonary disease, unspecified COPD type ([place]) Asymptomatic, former smokr Continue to monitor  Vitamin D deficiency Continue supplementation Check vitamin D level annually and as needed  Personal history of colonic polyp- adenoma Follow up colonoscopy was normal 10/2020, none further recommended by Dr. Carlean Purl Discuss options in 2032 if in good health   Overweight (BMI 25.0-29.9) Long discussion about weight loss, diet, and exercise Recommended diet heavy in fruits and veggies and low in animal meats, cheeses, and dairy products, appropriate calorie intake Discussed appropriate weight for height Follow up at next visit - TSH  Other abnormal glucose Recent A1Cs at goal Discussed diet/exercise, weight  management  Defer A1C; check CMP  Medication management - TSH  Hyperlipidemia Off of medications, hx of statin myalgia with low dose low frequency rosuvastatin, at goal LDL <100 by lifestyle  She stopped her Zetia as well because she thought it was contributing to muscle pain Continue low cholesterol diet and exercise.  Check lipid panel. TSH  Anxiety and depression Well managed by current regimen;  Currently on Celexa 40 mg daily, tried tapering and had additional stress and when back to her regular dose. Will try slow taper down alprazolam  Stress management techniques discussed, increase water, good sleep hygiene discussed, increase exercise, and increase veggies.    Overweight BMI 25-29 Discussed dietary and exercise modifications   Osteoarthritis, bilateral Continue supplements, weight loss, regular gentle exercise  Flu vaccine need High dose flu vaccine given  Right shoulder/bicep pain and decreased ROM - referral placed to orthopedics   Over 30 minutes of face to face exam, counseling, chart review and critical decision making was performed Future Appointments  Date Time Provider [place]  02/03/2022  2:30 PM Unk Pinto, MD [facility] None  02/27/2022  1:30 PM [facility] DX DEXA 1 [facility]  03/12/2022  2:30 PM Darrol Jump, NP [facility] None  08/04/2022  2:00 PM Unk Pinto, MD [facility] None     Plan:   During the course of the visit the patient was educated and counseled about appropriate screening and preventive services including:   Pneumococcal vaccine  Prevnar 13 Influenza vaccine Td vaccine Screening electrocardiogram Bone densitometry screening Colorectal cancer screening Diabetes screening Glaucoma screening Nutrition counseling  Advanced directives: requested   Subjective:  Taylor Li is a 71 y.o. female who presents for Medicare Annual Wellness Visit and 3  month follow up. She has Essential hypertension;  Hyperlipidemia; Vitamin D deficiency; Other abnormal glucose; Paroxysmal Afib; Medication management; Personal history of colonic polyp- adenoma; Anxiety and depression; COPD (chronic obstructive pulmonary disease) ([place]); Overweight (BMI 25.0-29.9); PVC's (premature ventricular contractions); Thoracic aortic aneurysm ([place]); and Fatigue on their problem list.  She is having bicep tendonitis of right bicep. She does have a lot of osteoarthritis but improved when stopped Rosuvastatin. On 10/12/21 she was attempting to get something in her car and felt pain in the right shoulder . The pain is worse when raising arm above head- hurts more in bicep and a little in the shoulder. She is getting numbness and tingling in right hand.   She has remote pAfib in 2002 & 2004 (Req CV), she has history of atypical CP and was evaluated by Dr. Gwenlyn Found, Her 2D echo was entirely normal except for a dilated thoracic aorta measuring 43 mm (has seen Dr. Roxan Hockey, now annual monitoring here, last CTA 09/19/2020 showed stable at 4.3 cm).  A Myoview stress test was normal as well.  Her event monitor at that time showed PVCs.  She denies having any abnormal heart rhythms that she is aware of  she has a diagnosis of depression/anxiety and is currently on wellbutrin 300 mg and celexa 40 mg daily, PRN xanax, reports symptoms are fairly well controlled on current regimen. she currently takes 1 at night for sleep and this is working well, receptive to taper down after discussion of risks due to pharmacokinetics in older adults, increased risk of unintentional overdose with daily use. Last Alprazolam 1 mg #60 was filled on 10/10/21  BMI is Body mass index is 28.8 kg/m., she has been working on diet and exercise, likes to dance.  Wt Readings from Last 3 Encounters:  12/09/21 189 lb 6.4 oz (85.9 kg)  07/30/21 183 lb (83 kg)  03/07/21 189 lb (85.7 kg)    Bp is controlled with Telmisartan 20 mg QD. Her blood pressure today their BP is BP:  122/78  BP Readings from Last 3 Encounters:  12/09/21 122/78  07/30/21 120/86  03/07/21 120/82  She does not workout. She denies chest pain, shortness of breath, dizziness.    She is not on cholesterol medication, taking triple omega 3 supplement (ezetimibe 10 mg daily stopped 2 months ago because she was having shoulder pain.)  Was taking alirocumab via cardiology, but cannot afford, severe myalgias with statin, even with rosuvastatin 5 mg 3 days a week).   Her cholesterol is at goal. The cholesterol last visit was:   Lab Results  Component Value Date   CHOL 213 (H) 07/30/2021   HDL 93 07/30/2021   LDLCALC 100 (H) 07/30/2021   TRIG 104 07/30/2021   CHOLHDL 2.3 07/30/2021    She has not been working on diet and exercise for glucose management, and denies foot ulcerations, increased appetite, nausea, paresthesia of the feet, polydipsia, polyuria and visual disturbances. Last A1C in the office was:  Lab Results  Component Value Date   HGBA1C 5.4 07/30/2021   Last GFR: Lab Results  Component Value Date   EGFR 77 07/30/2021    Patient is newly on Vitamin D supplement.   Lab Results  Component Value Date   VD25OH 72 07/30/2021      Medication Review: Current Outpatient Medications on File Prior to Visit  Medication Sig Dispense Refill   ALPRAZolam (XANAX) 1 MG tablet TAKE 1/2-1 TABLET 2-3 TIMES A DAY ONLY IF NEEDED FOR PANIC OR  ANXIETY ATTACK & LIMIT TO 5 DAYS /WEEK TO AVOID ADDICTION & DEMENTIA 60 tablet 0   Ascorbic Acid (VITAMIN C) 1000 MG tablet Take 1,000 mg by mouth daily.     BLACK PEPPER-TURMERIC PO Take 1,000 mg by mouth daily.      buPROPion (WELLBUTRIN XL) 300 MG 24 hr tablet TAKE 1 TABLET DAILY FOR MOOD, FOCUS & CONCENTRATION 90 tablet 3   Capsicum, Cayenne, (CAYENNE PEPPER PO) Take by mouth. Takes 1/4 tsp, 3 times per day     Cholecalciferol (VITAMIN D PO) Take 5,000 Units by mouth 1 day or 1 dose.     citalopram (CELEXA) 40 MG tablet TAKE 1 TABLET BY MOUTH DAILY  FOR MOOD 90 tablet 3   cyclobenzaprine (FLEXERIL) 10 MG tablet TAKE 1/2 TO 1 TABLET 2 TO 3 X /DAY AS NEEDED FOR MUSCLE SPASM 90 tablet 0   Magnesium 500 MG TABS Take by mouth. Takes 2 tablets daily     Multiple Vitamin (MULTIVITAMIN) tablet Take 1 tablet by mouth daily.     Omega-3 Fatty Acids (FISH OIL) 1000 MG CAPS Take by mouth daily.     Probiotic Product (PROBIOTIC DAILY PO) Take 1 tablet by mouth daily.     telmisartan (MICARDIS) 20 MG tablet TAKE 1 TABLET DAILY FOR BLOOD PRESSURE 90 tablet 3   ezetimibe (ZETIA) 10 MG tablet Take  1 tablet  Daily  for Cholesterol (Patient not taking: Reported on 12/09/2021) 90 tablet 3   No current facility-administered medications on file prior to visit.    Allergies  Allergen Reactions   Rosuvastatin     myalgias    Current Problems (verified) Patient Active Problem List   Diagnosis Date Noted   Fatigue 02/16/2020   PVC's (premature ventricular contractions) 09/04/2017   Thoracic aortic aneurysm ([place]) 09/04/2017   Overweight (BMI 25.0-29.9) 03/10/2017   COPD (chronic obstructive pulmonary disease) ([place]) 09/29/2016   Anxiety and depression 12/06/2013   Personal history of colonic polyp- adenoma 09/22/2013   Medication management 09/09/2013   Essential hypertension 01/14/2013   Hyperlipidemia 01/14/2013   Vitamin D deficiency 01/14/2013   Other abnormal glucose 01/14/2013   Paroxysmal Afib 01/14/2013    Screening Tests Immunization History  Administered Date(s) Administered   DTaP 01/13/2001   Influenza Inj Mdck Quad With Preservative 09/30/2016   Influenza Whole 10/06/2012   Influenza, High Dose Seasonal PF 10/15/2017, 10/24/2019, 12/09/2021   Influenza, Quadrivalent, Recombinant, Inj, Pf 10/15/2018   Influenza,inj,Quad PF,6+ Mos 10/27/2020   Influenza,inj,quad, With Preservative 10/18/2015   Influenza-Unspecified 11/11/2013, 11/15/2014   PFIZER Comirnaty(Gray Top)Covid-19 Tri-Sucrose Vaccine 05/15/2020   PFIZER(Purple  Top)SARS-COV-2 Vaccination 02/25/2019, 03/22/2019, 10/24/2019   PPD Test 01/14/2013, 04/17/2014, 04/16/2015, 05/15/2016   Pfizer Covid-19 Vaccine Bivalent Booster 42yr & up 10/27/2020   Pneumococcal Conjugate-13 06/15/2017   Pneumococcal Polysaccharide-23 01/13/2001, 02/01/2019   Tdap 10/18/2015   Zoster Recombinat (Shingrix) 03/29/2021, 06/14/2021   Zoster, Live 11/19/2009   Health Maintenance  Topic Date Due   COVID-19 Vaccine (6 - 2023-24 season) 12/25/2021 (Originally 09/13/2021)   Medicare Annual Wellness (AWV)  12/10/2022   COLONOSCOPY (Pts 45-416yrInsurance coverage will need to be confirmed)  10/26/2030   Pneumonia Vaccine [age]Years old  Completed   INFLUENZA VACCINE  Completed   DEXA SCAN  Completed   Hepatitis C Screening  Completed   Zoster Vaccines- Shingrix  Completed   HPV VACCINES  Aged Out    Preventative care: Last colonoscopy: 09/2013 polyp, repeat 10/2020 by Dr. GeCarlean Purlas normal, recommended no  further. Discuss cologuard in 2032.  Last mammogram: has implants, last 2015, was advised no further mammograms, to get breast MRI if any concerns, doing regular self exams Last pap smear/pelvic exam: hysterectomy   DEXA: 2011, patient req to repeat screening this year, order placed   Shringrix: Discussed vaccination  Names of Other Physician/Practitioners you currently use: 1. [facility] here for primary care 2. [place], eye doctor, last visit 2000, Due for 2022 3. Dr, Lavonne Chick dentist, last visit 2023  Patient Care Team: Unk Pinto, MD as PCP - General (Internal Medicine) [name], MD as PCP - Cardiology (Cardiology)  SURGICAL HISTORY She  has a past surgical history that includes Laminectomy (1982); Breast surgery (Bilateral, 1983); Vaginal hysterectomy (1985); Tonsillectomy; Nasal septum surgery; Cardioversion; Appendectomy; Cholecystectomy; and Colonoscopy. FAMILY HISTORY Her family history includes  Atrial fibrillation in her mother; Barrett's esophagus in her mother; Clotting disorder in her father; Hypertension in her father and mother; Lung cancer in her father; Mitral valve prolapse in her mother; Thyroid disease in her father. SOCIAL HISTORY She  reports that she quit smoking about 9 years ago. Her smoking use included cigarettes and e-cigarettes. She has never used smokeless tobacco. She reports current alcohol use. She reports that she does not use drugs.   MEDICARE WELLNESS OBJECTIVES: Physical activity: Current Exercise Habits: The patient does not participate in regular exercise at present, Exercise limited by: orthopedic condition(s) Cardiac risk factors: Cardiac Risk Factors include: advanced age (>66mn, >>23women);dyslipidemia;hypertension;sedentary lifestyle;smoking/ tobacco exposure Depression/mood screen:      12/09/2021    4:27 PM  Depression screen PHQ 2/9  Decreased Interest 0  Down, Depressed, Hopeless 0  PHQ - 2 Score 0    ADLs:     12/09/2021    4:27 PM 07/30/2021   12:41 AM  In your present state of health, do you have any difficulty performing the following activities:  Hearing? 0 0  Vision? 0 0  Difficulty concentrating or making decisions? 0 0  Walking or climbing stairs?  0  Dressing or bathing? 0 0  Doing errands, shopping? 0 0     Cognitive Testing  Alert? Yes  Normal Appearance?Yes  Oriented to person? Yes  Place? Yes   Time? Yes  Recall of three objects?  Yes  Can perform simple calculations? Yes  Displays appropriate judgment?Yes  Can read the correct time from a watch face?Yes  EOL planning: Does Patient Have a Medical Advance Directive?: Yes Type of Advance Directive: Healthcare Power of Attorney, Living will Does patient want to make changes to medical advance directive?: No - Patient declined Copy of [place] Chart?: Yes - validated most recent copy scanned in chart (See row information)  Review of Systems   Constitutional:  Negative for malaise/fatigue and weight loss.  HENT:  Negative for hearing loss and tinnitus.   Eyes:  Negative for blurred vision and double vision.  Respiratory:  Negative for cough, sputum production, shortness of breath and wheezing.   Cardiovascular:  Negative for chest pain, palpitations, orthopnea, claudication, leg swelling and PND.  Gastrointestinal:  Negative for abdominal pain, blood in stool, constipation, diarrhea, heartburn, melena, nausea and vomiting.  Genitourinary: Negative.   Musculoskeletal:  Positive for joint pain (Right shoulder and bicep). Negative for falls and myalgias.  Skin:  Negative for rash.  Neurological:  Negative for dizziness, tingling, sensory change, weakness and headaches.  Endo/Heme/Allergies:  Negative for polydipsia.  Psychiatric/Behavioral:  Positive  for depression (in remission on meds). Negative for memory loss, substance abuse and suicidal ideas. The patient is nervous/anxious and has insomnia (controlled with xanax).   All other systems reviewed and are negative.    Objective:     Today's Vitals   12/09/21 1542  BP: 122/78  Pulse: 61  Temp: 97.9 F (36.6 C)  SpO2: 96%  Weight: 189 lb 6.4 oz (85.9 kg)  Height: _0  (1.727 m)   Body mass index is 28.8 kg/m.  General appearance: alert, no distress, WD/WN, female HEENT: normocephalic, sclerae anicteric, TMs pearly, nares patent, no discharge or erythema, pharynx normal Oral cavity: MMM, no lesions Neck: supple, no lymphadenopathy, no thyromegaly, no masses Heart: Irregularly irregular, Atrial fib on EKG Lungs: CTA bilaterally, no wheezes, rhonchi, or rales Abdomen: +bs, soft, non tender, non distended, no masses, no hepatomegaly, no splenomegaly Musculoskeletal: nontender, no swelling, DIP and PIP joint enlargment, tenderness and erythema to left 2nd digit.  Right shoulder tenderness anteriorly, decreased ROM of right shoulder and right bicep, small mass in middle of  right bicep that is very tender Pulses: 2+ symmetric, upper and lower extremities, normal cap refill Neurological: alert, oriented x 3, CN2-12 intact, strength normal upper extremities and lower extremities, sensation normal throughout, DTRs 2+ throughout, no cerebellar signs, gait normal Psychiatric: normal affect, behavior normal, pleasant  EKG: Atrial Fibrillation  Medicare Attestation I have personally reviewed: The patient's medical and social history Their use of alcohol, tobacco or illicit drugs Their current medications and supplements The patient's functional ability including ADLs,fall risks, home safety risks, cognitive, and hearing and visual impairment Diet and physical activities Evidence for depression or mood disorders  The patient's weight, height, BMI, and visual acuity have been recorded in the chart.  I have made referrals, counseling, and provided education to the patient based on review of the above and I have provided the patient with a written personalized care plan for preventive services.     Alycia Rossetti, NP   12/09/2021

## 2021-12-10 ENCOUNTER — Encounter: Payer: Self-pay | Admitting: Physician Assistant

## 2021-12-10 ENCOUNTER — Ambulatory Visit: Payer: PPO | Attending: Physician Assistant | Admitting: Physician Assistant

## 2021-12-10 VITALS — BP 140/90 | HR 88 | Ht 68.0 in | Wt 187.6 lb

## 2021-12-10 DIAGNOSIS — I10 Essential (primary) hypertension: Secondary | ICD-10-CM

## 2021-12-10 DIAGNOSIS — I4891 Unspecified atrial fibrillation: Secondary | ICD-10-CM | POA: Diagnosis not present

## 2021-12-10 DIAGNOSIS — E782 Mixed hyperlipidemia: Secondary | ICD-10-CM

## 2021-12-10 DIAGNOSIS — I480 Paroxysmal atrial fibrillation: Secondary | ICD-10-CM | POA: Diagnosis not present

## 2021-12-10 DIAGNOSIS — I7121 Aneurysm of the ascending aorta, without rupture: Secondary | ICD-10-CM | POA: Diagnosis not present

## 2021-12-10 LAB — COMPLETE METABOLIC PANEL WITH GFR
AG Ratio: 1.6 (calc) (ref 1.0–2.5)
ALT: 16 U/L (ref 6–29)
AST: 14 U/L (ref 10–35)
Albumin: 4.4 g/dL (ref 3.6–5.1)
Alkaline phosphatase (APISO): 69 U/L (ref 37–153)
BUN: 16 mg/dL (ref 7–25)
CO2: 27 mmol/L (ref 20–32)
Calcium: 9.7 mg/dL (ref 8.6–10.4)
Chloride: 102 mmol/L (ref 98–110)
Creat: 0.81 mg/dL (ref 0.50–1.05)
Globulin: 2.7 g/dL (calc) (ref 1.9–3.7)
Glucose, Bld: 91 mg/dL (ref 65–99)
Potassium: 4.1 mmol/L (ref 3.5–5.3)
Sodium: 140 mmol/L (ref 135–146)
Total Bilirubin: 0.7 mg/dL (ref 0.2–1.2)
Total Protein: 7.1 g/dL (ref 6.1–8.1)
eGFR: 79 mL/min/{1.73_m2} (ref >=60)

## 2021-12-10 LAB — CBC WITH DIFFERENTIAL/PLATELET
Absolute Monocytes: 647 cells/uL (ref 200–950)
Basophils Absolute: 34 cells/uL (ref 0–200)
Basophils Relative: 0.4 %
Eosinophils Absolute: 151 cells/uL (ref 15–500)
Eosinophils Relative: 1.8 %
HCT: 42.2 % (ref 35.0–45.0)
Hemoglobin: 14.3 g/dL (ref 11.7–15.5)
Lymphs Abs: 3091 cells/uL (ref 850–3900)
MCH: 30.8 pg (ref 27.0–33.0)
MCHC: 33.9 g/dL (ref 32.0–36.0)
MCV: 90.8 fL (ref 80.0–100.0)
MPV: 11.1 fL (ref 7.5–12.5)
Monocytes Relative: 7.7 %
Neutro Abs: 4477 cells/uL (ref 1500–7800)
Neutrophils Relative %: 53.3 %
Platelets: 277 10*3/uL (ref 140–400)
RBC: 4.65 10*6/uL (ref 3.80–5.10)
RDW: 12.3 % (ref 11.0–15.0)
Total Lymphocyte: 36.8 %
WBC: 8.4 10*3/uL (ref 3.8–10.8)

## 2021-12-10 LAB — LIPID PANEL
Cholesterol: 200 mg/dL — ABNORMAL HIGH (ref <200)
HDL: 87 mg/dL (ref >=50)
LDL Cholesterol (Calc): 95 mg/dL (calc)
Non-HDL Cholesterol (Calc): 113 mg/dL (calc) (ref <130)
Total CHOL/HDL Ratio: 2.3 (calc) (ref <5.0)
Triglycerides: 90 mg/dL (ref <150)

## 2021-12-10 LAB — TSH: TSH: 1.53 mIU/L (ref 0.40–4.50)

## 2021-12-10 MED ORDER — APIXABAN 5 MG PO TABS: 5.0000 mg | ORAL_TABLET | Freq: Two times a day (BID) | ORAL | 3 refills | Status: DC

## 2021-12-10 NOTE — Assessment & Plan Note (Signed)
Blood pressure elevated here.  Her blood pressure has been optimal in the past.  I have asked her to monitor her blood pressure over the next couple weeks and send those readings for review through [facility].  Continue telmisartan 20 mg daily.

## 2021-12-10 NOTE — Progress Notes (Signed)
Cardiology Office Note:    Date:  12/10/2021   ID:  Taylor Li, DOB 02/28/1950, MRN [id number]  PCP:  [facility], [place] Prov[facility] Cardiologist:  Quay Burow, MD     Referring MD: Alycia Rossetti, NP   Chief Complaint:  Atrial Fibrillation    Patient Profile: Coronary Artery Ca2+  CT in 2019 Myoview 03/07/2020: EF 33, no ischemia or infarction; low risk Paroxysmal atrial fibrillation Ascending thoracic aortic aneurysm CT 09/2020: 4.3 cm  PVCs Monitor 02/2020: 5.6% burden Mobitz Type I (Wenckebach) Noted on monitor 02/2020 Cardiac monitor 03/10/2020: Sinus rhythm, frequent PVCs (5.6% burden), short runs of NSVT; Mobitz type I Seen by EP (Dr. Rayann Heman) >> beta-blocker DC'd Hypertension  Hyperlipidemia  Intol to statins Echo 03/12/2020: EF 60-65, no RWMA, mild LVH, GR 1 DD, GLS -19.5, normal RVSF, trivial MR, trivial AI, dilated aortic root (39 mm), dilated ascending aorta (39 mm), RAP 3 Chronic Obstructive Pulmonary Disease     Cardiac Studies & Procedures     STRESS TESTS  MYOCARDIAL PERFUSION IMAGING 03/07/2020  Narrative  The left ventricular ejection fraction is normal (55-65%).  Nuclear stress EF: 59%.  There was no ST segment deviation noted during stress.  The study is normal.  This is a low risk study.  Shifting artifact noted in anterior wall in supine stress images and inferior wall on upright stress images. Overall, normal perfusion. No ischemia or infarction noted. Normal wall motion.   ECHOCARDIOGRAM  ECHOCARDIOGRAM COMPLETE 03/12/2020  Narrative ECHOCARDIOGRAM REPORT    Patient Name:   Taylor Li Date of Exam: 03/12/2020 Medical Rec #:  [id number]     Height:       69.0 in Accession #:    [id number]    Weight:       203.0 lb Date of Birth:  October 27, 1950     BSA:          2.079 m Patient Age:    71 years      BP:           116/84 mmHg Patient Gender: F             HR:           63 bpm. Exam Location:  [address]  Procedure: 2D Echo, 3D Echo, Cardiac Doppler, Color Doppler and Strain Analysis  Indications:    R06.00 Dyspnea on exertion  History:        Patient has prior history of Echocardiogram examinations, most recent 09/09/2017. COPD, Arrythmias:Atrial Fibrillation and PVC; Risk Factors:Hypertension, Dyslipidemia and Former Smoker. Thoracic aortic aneurysm.  Sonographer:    Jessee Avers, RDCS Referring Phys: [place]   1. Left ventricular ejection fraction, by estimation, is 60 to 65%. Left ventricular ejection fraction by 3D volume is 61 %. The left ventricle has normal function. The left ventricle has no regional wall motion abnormalities. There is mild concentric left ventricular hypertrophy. Left ventricular diastolic parameters are consistent with Grade I diastolic dysfunction (impaired relaxation). Elevated left ventricular end-diastolic pressure. The average left ventricular global longitudinal strain is -19.5 %. The global longitudinal strain is normal. 2. Right ventricular systolic function is normal. The right ventricular size is normal. Tricuspid regurgitation signal is inadequate for assessing PA pressure. 3. The mitral valve is normal in structure. Trivial mitral valve regurgitation. No evidence of mitral stenosis. 4. The aortic valve is normal in structure. Aortic valve regurgitation is trivial. No aortic stenosis is present.  5. Aortic dilatation noted. There is mild dilatation of the aortic root, measuring 39 mm. There is mild dilatation of the ascending aorta, measuring 39 mm. 6. The inferior vena cava is normal in size with greater than 50% respiratory variability, suggesting right atrial pressure of 3 mmHg.  Comparison(s): 09/09/17 EF 55-60%.  FINDINGS Left Ventricle: Left ventricular ejection fraction, by estimation, is 60 to 65%. Left ventricular ejection fraction by 3D volume is 61 %. The left ventricle has normal function. The left ventricle  has no regional wall motion abnormalities. The average left ventricular global longitudinal strain is -19.5 %. The global longitudinal strain is normal. The left ventricular internal cavity size was normal in size. There is mild concentric left ventricular hypertrophy. Left ventricular diastolic parameters are consistent with Grade I diastolic dysfunction (impaired relaxation). Elevated left ventricular end-diastolic pressure.  Right Ventricle: The right ventricular size is normal. No increase in right ventricular wall thickness. Right ventricular systolic function is normal. Tricuspid regurgitation signal is inadequate for assessing PA pressure.  Left Atrium: Left atrial size was normal in size.  Right Atrium: Right atrial size was normal in size.  Pericardium: There is no evidence of pericardial effusion.  Mitral Valve: The mitral valve is normal in structure. Trivial mitral valve regurgitation. No evidence of mitral valve stenosis.  Tricuspid Valve: The tricuspid valve is normal in structure. Tricuspid valve regurgitation is trivial. No evidence of tricuspid stenosis.  Aortic Valve: The aortic valve is normal in structure. Aortic valve regurgitation is trivial. No aortic stenosis is present.  Pulmonic Valve: The pulmonic valve was normal in structure. Pulmonic valve regurgitation is trivial. No evidence of pulmonic stenosis.  Aorta: Aortic dilatation noted. There is mild dilatation of the aortic root, measuring 39 mm. There is mild dilatation of the ascending aorta, measuring 39 mm.  Venous: The inferior vena cava is normal in size with greater than 50% respiratory variability, suggesting right atrial pressure of 3 mmHg.  IAS/Shunts: No atrial level shunt detected by color flow Doppler.   LEFT VENTRICLE PLAX 2D LVIDd:         4.50 cm         Diastology LVIDs:         2.75 cm         LV e' medial:    4.65 cm/s LV PW:         1.30 cm         LV E/e' medial:  16.5 LV IVS:        1.30  cm         LV e' lateral:   7.73 cm/s LVOT diam:     2.60 cm         LV E/e' lateral: 9.9 LV SV:         121 LV SV Index:   58              2D LVOT Area:     5.31 cm        Longitudinal Strain 2D Strain GLS  -25.0 % (A2C): 2D Strain GLS  -17.6 % (A3C): 2D Strain GLS  -16.0 % (A4C): 2D Strain GLS  -19.5 % Avg:  3D Volume EF LV 3D EF:    Left ventricular ejection fraction by 3D volume is 61 %.  3D Volume EF: 3D EF:        61 % LV EDV:       144 ml LV ESV:       57  ml LV SV:        88 ml  RIGHT VENTRICLE RV Basal diam:  3.10 cm RV S prime:     10.80 cm/s TAPSE (M-mode): 2.7 cm  LEFT ATRIUM             Index       RIGHT ATRIUM           Index LA diam:        3.50 cm 1.68 cm/m  RA Pressure: 3.00 mmHg LA Vol (A2C):   21.7 ml 10.44 ml/m RA Area:     13.80 cm LA Vol (A4C):   22.0 ml 10.58 ml/m RA Volume:   32.10 ml  15.44 ml/m LA Biplane Vol: 24.2 ml 11.64 ml/m AORTIC VALVE LVOT Vmax:   92.00 cm/s LVOT Vmean:  61.400 cm/s LVOT VTI:    0.227 m  AORTA Ao Root diam: 3.90 cm Ao Asc diam:  4.25 cm  MITRAL VALVE               TRICUSPID VALVE Estimated RAP:  3.00 mmHg  MV E velocity: 76.70 cm/s  SHUNTS MV A velocity: 81.80 cm/s  Systemic VTI:  0.23 m MV E/A ratio:  0.94        Systemic Diam: 2.60 cm  Fransico Him MD Electronically signed by Fransico Him MD Signature Date/Time: 03/12/2020/11:03:55 AM    Final    MONITORS  LONG TERM MONITOR (3-14 DAYS) 03/10/2020  Narrative Patch Wear Time:  6 days and 23 hours (2022-02-08T11:26:41-0500 to 2022-02-15T11:08:50-0500)  Patient had a min HR of 30 bpm, max HR of 197 bpm, and avg HR of 65 bpm. Predominant underlying rhythm was Sinus Rhythm. First Degree AV Block was present. 2 Ventricular Tachycardia runs occurred, the run with the fastest interval lasting 6 beats with a max rate of 197 bpm, the longest lasting 5 beats with an avg rate of 104 bpm. Second Degree AV Block-Mobitz I (Wenckebach) was present. Isolated  SVEs were rare (<1.0%), SVE Couplets were rare (<1.0%), and SVE Triplets were rare (<1.0%). Isolated VEs were frequent (5.6%, 31848), VE Couplets were rare (<1.0%, 2135), and VE Triplets were rare (<1.0%, 95). Ventricular Bigeminy and Trigeminy were present.  1. SR/SB/ST 2. Freq PVCs 3. Short runs of NSVT 4. Mobitz Type 2 second degree AVB            History of Present Illness:   Taylor Li is a 71 y.o. female with the above problem list.  She was seen by primary care yesterday and noted to be in atrial fibrillation. She was placed on Eliquis, Diltiazem and referred back for evaluation.   Electrocardiogram from 12/09/2021 was personally reviewed and interpreted.  This demonstrates atrial fibrillation with an HR of 80, frequent PVCs, left axis deviation QTc 480  The patient notes that she developed COVID in late October.  She has been fatigued since that time as well as has had some dyspnea with exertion.  She assumed this was all related to COVID.  She has not had chest pain, orthopnea, leg edema, syncope, palpitations.  She has had some arthralgias, especially in her shoulders.  She stopped taking Zetia because of this.  She had some hemorrhoidal bleeding on 1 occasion some weeks ago.  She does have issues with diarrhea from time to time.  However, this is a chronic symptom.         Past Medical History:  Diagnosis Date   A-fib [facility])    Arthritis  C. difficile diarrhea    Hyperlipidemia    Hypertension    Personal history of colonic polyp- adenoma 09/22/2013   Pre-diabetes    Rectal prolapse    Vitamin D deficiency    Current Medications: Current Meds  Medication Sig   ALPRAZolam (XANAX) 1 MG tablet TAKE 1/2-1 TABLET 2-3 TIMES A DAY ONLY IF NEEDED FOR PANIC OR ANXIETY ATTACK & LIMIT TO 5 DAYS /WEEK TO AVOID ADDICTION & DEMENTIA   Ascorbic Acid (VITAMIN C) 1000 MG tablet Take 1,000 mg by mouth daily.   BLACK PEPPER-TURMERIC PO Take 1,000 mg by mouth daily.    buPROPion  (WELLBUTRIN XL) 300 MG 24 hr tablet TAKE 1 TABLET DAILY FOR MOOD, FOCUS & CONCENTRATION   Capsicum, Cayenne, (CAYENNE PEPPER PO) Take by mouth. Takes 1/4 tsp, 3 times per day   Cholecalciferol (VITAMIN D PO) Take 5,000 Units by mouth 1 day or 1 dose.   citalopram (CELEXA) 40 MG tablet TAKE 1 TABLET BY MOUTH DAILY FOR MOOD   cyclobenzaprine (FLEXERIL) 10 MG tablet TAKE 1/2 TO 1 TABLET 2 TO 3 X /DAY AS NEEDED FOR MUSCLE SPASM   Magnesium 500 MG TABS Take by mouth. Takes 2 tablets daily   Multiple Vitamin (MULTIVITAMIN) tablet Take 1 tablet by mouth daily.   Omega-3 Fatty Acids (FISH OIL) 1000 MG CAPS Take by mouth daily.   Probiotic Product (PROBIOTIC DAILY PO) Take 1 tablet by mouth daily.   telmisartan (MICARDIS) 20 MG tablet TAKE 1 TABLET DAILY FOR BLOOD PRESSURE   [DISCONTINUED] apixaban (ELIQUIS) 5 MG TABS tablet Take 1 tablet (5 mg total) by mouth 2 (two) times daily.    Allergies:   Rosuvastatin   Social History   Occupational History   Occupation: Therapist, sports  Tobacco Use   Smoking status: Former    Types: Cigarettes, E-cigarettes    Quit date: 04/18/2012    Years since quitting: 9.6   Smokeless tobacco: Never   Tobacco comments:    e-cigarettes  Vaping Use   Vaping Use: Never used  Substance and Sexual Activity   Alcohol use: Yes    Comment: 3-4 glasses of wine per week   Drug use: No   Sexual activity: Not on file    Family Hx: The patient's family history includes Atrial fibrillation in her mother; Barrett's esophagus in her mother; Clotting disorder in her father; Hypertension in her father and mother; Lung cancer in her father; Mitral valve prolapse in her mother; Thyroid disease in her father. There is no history of Colon cancer, Diabetes, Esophageal cancer, Pancreatic cancer, or Stomach cancer.  ROS   EKGs/Labs/Other Test Reviewed:    EKG:  EKG is not ordered today.     Recent Labs: 07/30/2021: Magnesium 2.1 12/09/2021: ALT 16; BUN 16; Creat 0.81; Hemoglobin 14.3;  Platelets 277; Potassium 4.1; Sodium 140; TSH 1.53   Recent Lipid Panel Recent Labs    12/09/21 1704  CHOL 200*  TRIG 90  HDL 87  LDLCALC 95      Risk Assessment/Calculations/Metrics:    CHA2DS2-VASc Score = 4   This indicates a 4.8% annual risk of stroke. The patient's score is based upon: CHF History: 0 HTN History: 1 Diabetes History: 0 Stroke History: 0 Vascular Disease History: 1 Age Score: 1 Gender Score: 1        HYPERTENSION CONTROL Vitals:   12/10/21 1349 12/10/21 1817  BP: (!) 128/98 (!) 140/90    The patient's blood pressure is elevated above target today.  In  order to address the patient's elevated BP: Blood pressure will be monitored at home to determine if medication changes need to be made.       Physical Exam:    VS:  BP (!) 140/90   Pulse 88   Ht '5\' 8"'$  (1.727 m)   Wt 187 lb 9.6 oz (85.1 kg)   SpO2 97%   BMI 28.52 kg/m     Wt Readings from Last 3 Encounters:  12/10/21 187 lb 9.6 oz (85.1 kg)  12/09/21 189 lb 6.4 oz (85.9 kg)  07/30/21 183 lb (83 kg)    Constitutional:      Appearance: Healthy appearance. Not in distress.  Neck:     Thyroid: No thyromegaly.     Vascular: JVD normal.  Pulmonary:     Effort: Pulmonary effort is normal.     Breath sounds: No wheezing. No rales.  Cardiovascular:     Normal rate. Irregularly irregular rhythm. Normal S1. Normal S2.      Murmurs: There is no murmur.  Edema:    Peripheral edema absent.  Abdominal:     Palpations: Abdomen is soft.  Skin:    General: Skin is warm and dry.  Neurological:     Mental Status: Alert and oriented to person, place and time.          ASSESSMENT & PLAN:   PAF (paroxysmal atrial fibrillation) ([place]) She has likely been in atrial fibrillation since late October when she developed COVID.  It is not clear if her atrial fibrillation is completely related to COVID.  She does have a history of atrial fibrillation in the past.  She was taken off of anticoagulation for  unclear reasons at that time.  She does have significant thromboembolic risk factors and requires long-term anticoagulation.  She is symptomatic with fatigue and shortness of breath.  She does not have any evidence of heart failure on exam.  Her heart rate is controlled.  She had evidence of Mobitz type I on the monitor in the past and her beta-blocker was stopped by Dr. Rayann Heman secondary to this.  She was given a prescription for diltiazem yesterday but has not yet started it.  I have advised her to not start this medication given her prior history and controlled heart rate.  We discussed the rationale for 3 weeks of anticoagulation and proceeding with cardioversion versus proceeding directly to TEE guided cardioversion.  She prefers to proceed with TEE guided cardioversion.  I reviewed this with Dr. Tamala Julian (attending MD) who agreed. Continue Eliquis 5 mg twice daily DC diltiazem Arrange TEE guided cardioversion Friday of this week or early next week Follow-up in the atrial fibrillation clinic post cardioversion Follow-up with Dr. Gwenlyn Found in 3 months  Essential hypertension Blood pressure elevated here.  Her blood pressure has been optimal in the past.  I have asked her to monitor her blood pressure over the next couple weeks and send those readings for review through [place].  Continue telmisartan 20 mg daily.  Hyperlipidemia She has been intolerant to statins in the past.  She stopped taking Zetia due to arthralgias.  I have recommended that she resume Zetia after cardioversion.  If she has recurrent side effects, we can refer her to the Pharm.D. clinic for consideration of PCSK9 inhibitor therapy.  Thoracic aortic aneurysm (HCC) 4.3 cm on CT in September 2022.  Her aneurysm is monitored by serial CT scans arranged through primary care.        Shared Decision Making/Informed  Consent The risks [stroke, cardiac arrhythmias rarely resulting in the need for a temporary or permanent pacemaker, skin  irritation or burns, esophageal damage, perforation (1:10,000 risk), bleeding, pharyngeal hematoma as well as other potential complications associated with conscious sedation including aspiration, arrhythmia, respiratory failure and death], benefits (treatment guidance, restoration of normal sinus rhythm, diagnostic support) and alternatives of a transesophageal echocardiogram guided cardioversion were discussed in detail with Ms. Bobeck and she is willing to proceed.   Dispo:  Return in about 3 months (around 03/12/2022) for Routine Follow Up w/ Dr. Gwenlyn Found.   Medication Adjustments/Labs and Tests Ordered: Current medicines are reviewed at length with the patient today.  Concerns regarding medicines are outlined above.  Tests Ordered: Orders Placed This Encounter  Procedures   ECHOCARDIOGRAM COMPLETE   Medication Changes: Meds ordered this encounter  Medications   apixaban (ELIQUIS) 5 MG TABS tablet    Sig: Take 1 tablet (5 mg total) by mouth 2 (two) times daily.    Dispense:  180 tablet    Refill:  3   Signed, Richardson Dopp, PA-C  12/10/2021 6:20 PM    [address], [place], [place]  [address] Phone: [phone]; Fax: [phone]

## 2021-12-10 NOTE — Assessment & Plan Note (Signed)
She has likely been in atrial fibrillation since late October when she developed COVID.  It is not clear if her atrial fibrillation is completely related to COVID.  She does have a history of atrial fibrillation in the past.  She was taken off of anticoagulation for unclear reasons at that time.  She does have significant thromboembolic risk factors and requires long-term anticoagulation.  She is symptomatic with fatigue and shortness of breath.  She does not have any evidence of heart failure on exam.  Her heart rate is controlled.  She had evidence of Mobitz type I on the monitor in the past and her beta-blocker was stopped by Dr. Rayann Heman secondary to this.  She was given a prescription for diltiazem yesterday but has not yet started it.  I have advised her to not start this medication given her prior history and controlled heart rate.  We discussed the rationale for 3 weeks of anticoagulation and proceeding with cardioversion versus proceeding directly to TEE guided cardioversion.  She prefers to proceed with TEE guided cardioversion.  I reviewed this with Dr. Tamala Julian (attending MD) who agreed. Continue Eliquis 5 mg twice daily DC diltiazem Arrange TEE guided cardioversion Friday of this week or early next week Follow-up in the atrial fibrillation clinic post cardioversion Follow-up with Dr. Gwenlyn Found in 3 months

## 2021-12-10 NOTE — Patient Instructions (Addendum)
Medication Instructions:  Your physician recommends that you continue on your current medications as directed. Please refer to the Current Medication list given to you today.  *If you need a refill on your cardiac medications before your next appointment, please call your pharmacy*   Lab Work: None ordered  If you have labs (blood work) drawn today and your tests are completely normal, you will receive your results only by: [place] (if you have MyChart) OR A paper copy in the mail If you have any lab test that is abnormal or we need to change your treatment, we will call you to review the results.   Testing/Procedures: Your physician has requested that you have an echocardiogram. Echocardiography is a painless test that uses sound waves to create images of your heart. It provides your doctor with information about the size and shape of your heart and how well your heart's chambers and valves are working. This procedure takes approximately one hour. There are no restrictions for this procedure. Please do NOT wear cologne, perfume, aftershave, or lotions (deodorant is allowed). Please arrive 15 minutes prior to your appointment time.   Your physician has requested that you have a TEE/Cardioversion. During a TEE, sound waves are used to create images of your heart. It provides your doctor with information about the size and shape of your heart and how well your heart's chambers and valves are working. In this test, a transducer is attached to the end of a flexible tube that is guided down you throat and into your esophagus (the tube leading from your mouth to your stomach) to get a more detailed image of your heart. Once the TEE has determined that a blood clot is not present, the cardioversion begins. Electrical Cardioversion uses a jolt of electricity to your heart either through paddles or wired patches attached to your chest. This is a controlled, usually prescheduled, procedure. This  procedure is done at the hospital and you are not awake during the procedure. You usually go home the day of the procedure. Please see the instruction sheet given to you BELOW:      Dear Taylor Li  You are scheduled for a TEE (Transesophageal Echocardiogram) Guided Cardioversion on FRIDAY, December 13, 2021 with Dr. Oval Linsey.  Please arrive at the [facility] (Main Entrance A) at [facility]: [address] [place], [place] [address] at 7:00 A.M.   DIET:  Nothing to eat or drink after midnight except a sip of water with medications (see medication instructions below)  MEDICATION INSTRUCTIONS: NO INSTRUCTIONS OTHER THAN MAKE SURE YOU TAKE YOUR ELIQUIS AND DON'T MISS A DOSE Continue taking your anticoagulant (blood thinner): Apixaban (Eliquis).  You will need to continue this after your procedure until you are told by your provider that it is safe to stop.    LABS: NOT NEEDED  FYI:  For your safety, and to allow [place] to monitor your vital signs accurately during the surgery/procedure we request: If you have artificial nails, gel coating, SNS etc, please have those removed prior to your surgery/procedure. Not having the nail coverings /polish removed may result in cancellation or delay of your surgery/procedure.  You must have a responsible person to drive you home and stay in the waiting area during your procedure. Failure to do so could result in cancellation.  Bring your insurance cards.  *Special Note: Every effort is made to have your procedure done on time. Occasionally there are emergencies that occur at the hospital that  may cause delays. Please be patient if a delay does occur.       Follow-Up: At [facility], you and your health needs are our priority.  As part of our continuing mission to provide you with exceptional heart care, we have created designated Provider Care Teams.  These Care Teams include your primary Cardiologist (physician) and Advanced  Practice Providers (APPs -  Physician Assistants and Nurse Practitioners) who all work together to provide you with the care you need, when you need it.  We recommend signing up for the patient portal called "MyChart".  Sign up information is provided on this After Visit Summary.  MyChart is used to connect with patients for Virtual Visits (Telemedicine).  Patients are able to view lab/test results, encounter notes, upcoming appointments, etc.  Non-urgent messages can be sent to your provider as well.   To learn more about what you can do with MyChart, go to [email or web address].    Your next appointment:   [facility] AFTER 1  week(s)  The format for your next appointment:   In Person  Provider:   3 MONTHS   Quay Burow, MD  or Richardson Dopp, PA-C         Other Instructions   Important Information About Sugar        Your physician has requested that you regularly monitor and record your blood pressure readings at home. Please use the same machine at the same time of day to check your readings and record them to bring to your follow-up visit.   Please monitor blood pressures and keep a log of your readings for 2 weeks then send them via mychart.    Make sure to check 2 hours after your medications.    AVOID these things for 30 minutes before checking your blood pressure: No Drinking caffeine. No Drinking alcohol. No Eating. No Smoking. No Exercising.   Five minutes before checking your blood pressure: Pee. Sit in a dining chair. Avoid sitting in a soft couch or armchair. Be quiet. Do not talk

## 2021-12-10 NOTE — Assessment & Plan Note (Signed)
4.3 cm on CT in September 2022.  Her aneurysm is monitored by serial CT scans arranged through primary care.

## 2021-12-10 NOTE — H&P (View-Only) (Signed)
Cardiology Office Note:    Date:  12/10/2021   ID:  Taylor Li, DOB January 10, 1951, MRN [id number]  PCP:  [facility], [place] Providers Cardiologist:  Quay Burow, MD     Referring MD: Alycia Rossetti, NP   Chief Complaint:  Atrial Fibrillation    Patient Profile: Coronary Artery Ca2+  CT in 2019 Myoview 03/07/2020: EF 8, no ischemia or infarction; low risk Paroxysmal atrial fibrillation Ascending thoracic aortic aneurysm CT 09/2020: 4.3 cm  PVCs Monitor 02/2020: 5.6% burden Mobitz Type I (Wenckebach) Noted on monitor 02/2020 Cardiac monitor 03/10/2020: Sinus rhythm, frequent PVCs (5.6% burden), short runs of NSVT; Mobitz type I Seen by EP (Dr. Rayann Heman) >> beta-blocker DC'd Hypertension  Hyperlipidemia  Intol to statins Echo 03/12/2020: EF 60-65, no RWMA, mild LVH, GR 1 DD, GLS -19.5, normal RVSF, trivial MR, trivial AI, dilated aortic root (39 mm), dilated ascending aorta (39 mm), RAP 3 Chronic Obstructive Pulmonary Disease     Cardiac Studies & Procedures     STRESS TESTS  MYOCARDIAL PERFUSION IMAGING 03/07/2020  Narrative  The left ventricular ejection fraction is normal (55-65%).  Nuclear stress EF: 59%.  There was no ST segment deviation noted during stress.  The study is normal.  This is a low risk study.  Shifting artifact noted in anterior wall in supine stress images and inferior wall on upright stress images. Overall, normal perfusion. No ischemia or infarction noted. Normal wall motion.   ECHOCARDIOGRAM  ECHOCARDIOGRAM COMPLETE 03/12/2020  Narrative ECHOCARDIOGRAM REPORT    Patient Name:   Taylor Li Date of Exam: 03/12/2020 Medical Rec #:  [id number]     Height:       69.0 in Accession #:    [id number]    Weight:       203.0 lb Date of Birth:  19-Nov-1950     BSA:          2.079 m Patient Age:    71 years      BP:           116/84 mmHg Patient Gender: F             HR:           63 bpm. Exam Location:  [address]  Procedure: 2D Echo, 3D Echo, Cardiac Doppler, Color Doppler and Strain Analysis  Indications:    R06.00 Dyspnea on exertion  History:        Patient has prior history of Echocardiogram examinations, most recent 09/09/2017. COPD, Arrythmias:Atrial Fibrillation and PVC; Risk Factors:Hypertension, Dyslipidemia and Former Smoker. Thoracic aortic aneurysm.  Sonographer:    Jessee Avers, RDCS Referring Phys: [place]   1. Left ventricular ejection fraction, by estimation, is 60 to 65%. Left ventricular ejection fraction by 3D volume is 61 %. The left ventricle has normal function. The left ventricle has no regional wall motion abnormalities. There is mild concentric left ventricular hypertrophy. Left ventricular diastolic parameters are consistent with Grade I diastolic dysfunction (impaired relaxation). Elevated left ventricular end-diastolic pressure. The average left ventricular global longitudinal strain is -19.5 %. The global longitudinal strain is normal. 2. Right ventricular systolic function is normal. The right ventricular size is normal. Tricuspid regurgitation signal is inadequate for assessing PA pressure. 3. The mitral valve is normal in structure. Trivial mitral valve regurgitation. No evidence of mitral stenosis. 4. The aortic valve is normal in structure. Aortic valve regurgitation is trivial. No aortic stenosis is present.  5. Aortic dilatation noted. There is mild dilatation of the aortic root, measuring 39 mm. There is mild dilatation of the ascending aorta, measuring 39 mm. 6. The inferior vena cava is normal in size with greater than 50% respiratory variability, suggesting right atrial pressure of 3 mmHg.  Comparison(s): 09/09/17 EF 55-60%.  FINDINGS Left Ventricle: Left ventricular ejection fraction, by estimation, is 60 to 65%. Left ventricular ejection fraction by 3D volume is 61 %. The left ventricle has normal function. The left ventricle  has no regional wall motion abnormalities. The average left ventricular global longitudinal strain is -19.5 %. The global longitudinal strain is normal. The left ventricular internal cavity size was normal in size. There is mild concentric left ventricular hypertrophy. Left ventricular diastolic parameters are consistent with Grade I diastolic dysfunction (impaired relaxation). Elevated left ventricular end-diastolic pressure.  Right Ventricle: The right ventricular size is normal. No increase in right ventricular wall thickness. Right ventricular systolic function is normal. Tricuspid regurgitation signal is inadequate for assessing PA pressure.  Left Atrium: Left atrial size was normal in size.  Right Atrium: Right atrial size was normal in size.  Pericardium: There is no evidence of pericardial effusion.  Mitral Valve: The mitral valve is normal in structure. Trivial mitral valve regurgitation. No evidence of mitral valve stenosis.  Tricuspid Valve: The tricuspid valve is normal in structure. Tricuspid valve regurgitation is trivial. No evidence of tricuspid stenosis.  Aortic Valve: The aortic valve is normal in structure. Aortic valve regurgitation is trivial. No aortic stenosis is present.  Pulmonic Valve: The pulmonic valve was normal in structure. Pulmonic valve regurgitation is trivial. No evidence of pulmonic stenosis.  Aorta: Aortic dilatation noted. There is mild dilatation of the aortic root, measuring 39 mm. There is mild dilatation of the ascending aorta, measuring 39 mm.  Venous: The inferior vena cava is normal in size with greater than 50% respiratory variability, suggesting right atrial pressure of 3 mmHg.  IAS/Shunts: No atrial level shunt detected by color flow Doppler.   LEFT VENTRICLE PLAX 2D LVIDd:         4.50 cm         Diastology LVIDs:         2.75 cm         LV e' medial:    4.65 cm/s LV PW:         1.30 cm         LV E/e' medial:  16.5 LV IVS:        1.30  cm         LV e' lateral:   7.73 cm/s LVOT diam:     2.60 cm         LV E/e' lateral: 9.9 LV SV:         121 LV SV Index:   58              2D LVOT Area:     5.31 cm        Longitudinal Strain 2D Strain GLS  -25.0 % (A2C): 2D Strain GLS  -17.6 % (A3C): 2D Strain GLS  -16.0 % (A4C): 2D Strain GLS  -19.5 % Avg:  3D Volume EF LV 3D EF:    Left ventricular ejection fraction by 3D volume is 61 %.  3D Volume EF: 3D EF:        61 % LV EDV:       144 ml LV ESV:       57  ml LV SV:        88 ml  RIGHT VENTRICLE RV Basal diam:  3.10 cm RV S prime:     10.80 cm/s TAPSE (M-mode): 2.7 cm  LEFT ATRIUM             Index       RIGHT ATRIUM           Index LA diam:        3.50 cm 1.68 cm/m  RA Pressure: 3.00 mmHg LA Vol (A2C):   21.7 ml 10.44 ml/m RA Area:     13.80 cm LA Vol (A4C):   22.0 ml 10.58 ml/m RA Volume:   32.10 ml  15.44 ml/m LA Biplane Vol: 24.2 ml 11.64 ml/m AORTIC VALVE LVOT Vmax:   92.00 cm/s LVOT Vmean:  61.400 cm/s LVOT VTI:    0.227 m  AORTA Ao Root diam: 3.90 cm Ao Asc diam:  4.25 cm  MITRAL VALVE               TRICUSPID VALVE Estimated RAP:  3.00 mmHg  MV E velocity: 76.70 cm/s  SHUNTS MV A velocity: 81.80 cm/s  Systemic VTI:  0.23 m MV E/A ratio:  0.94        Systemic Diam: 2.60 cm  Fransico Him MD Electronically signed by Fransico Him MD Signature Date/Time: 03/12/2020/11:03:55 AM    Final    MONITORS  LONG TERM MONITOR (3-14 DAYS) 03/10/2020  Narrative Patch Wear Time:  6 days and 23 hours (2022-02-08T11:26:41-0500 to 2022-02-15T11:08:50-0500)  Patient had a min HR of 30 bpm, max HR of 197 bpm, and avg HR of 65 bpm. Predominant underlying rhythm was Sinus Rhythm. First Degree AV Block was present. 2 Ventricular Tachycardia runs occurred, the run with the fastest interval lasting 6 beats with a max rate of 197 bpm, the longest lasting 5 beats with an avg rate of 104 bpm. Second Degree AV Block-Mobitz I (Wenckebach) was present. Isolated  SVEs were rare (<1.0%), SVE Couplets were rare (<1.0%), and SVE Triplets were rare (<1.0%). Isolated VEs were frequent (5.6%, 31848), VE Couplets were rare (<1.0%, 2135), and VE Triplets were rare (<1.0%, 95). Ventricular Bigeminy and Trigeminy were present.  1. SR/SB/ST 2. Freq PVCs 3. Short runs of NSVT 4. Mobitz Type 2 second degree AVB            History of Present Illness:   Taylor Li is a 71 y.o. female with the above problem list.  She was seen by primary care yesterday and noted to be in atrial fibrillation. She was placed on Eliquis, Diltiazem and referred back for evaluation.   Electrocardiogram from 12/09/2021 was personally reviewed and interpreted.  This demonstrates atrial fibrillation with an HR of 80, frequent PVCs, left axis deviation QTc 480  The patient notes that she developed COVID in late October.  She has been fatigued since that time as well as has had some dyspnea with exertion.  She assumed this was all related to COVID.  She has not had chest pain, orthopnea, leg edema, syncope, palpitations.  She has had some arthralgias, especially in her shoulders.  She stopped taking Zetia because of this.  She had some hemorrhoidal bleeding on 1 occasion some weeks ago.  She does have issues with diarrhea from time to time.  However, this is a chronic symptom.         Past Medical History:  Diagnosis Date   A-fib [facility])    Arthritis  C. difficile diarrhea    Hyperlipidemia    Hypertension    Personal history of colonic polyp- adenoma 09/22/2013   Pre-diabetes    Rectal prolapse    Vitamin D deficiency    Current Medications: Current Meds  Medication Sig   ALPRAZolam (XANAX) 1 MG tablet TAKE 1/2-1 TABLET 2-3 TIMES A DAY ONLY IF NEEDED FOR PANIC OR ANXIETY ATTACK & LIMIT TO 5 DAYS /WEEK TO AVOID ADDICTION & DEMENTIA   Ascorbic Acid (VITAMIN C) 1000 MG tablet Take 1,000 mg by mouth daily.   BLACK PEPPER-TURMERIC PO Take 1,000 mg by mouth daily.    buPROPion  (WELLBUTRIN XL) 300 MG 24 hr tablet TAKE 1 TABLET DAILY FOR MOOD, FOCUS & CONCENTRATION   Capsicum, Cayenne, (CAYENNE PEPPER PO) Take by mouth. Takes 1/4 tsp, 3 times per day   Cholecalciferol (VITAMIN D PO) Take 5,000 Units by mouth 1 day or 1 dose.   citalopram (CELEXA) 40 MG tablet TAKE 1 TABLET BY MOUTH DAILY FOR MOOD   cyclobenzaprine (FLEXERIL) 10 MG tablet TAKE 1/2 TO 1 TABLET 2 TO 3 X /DAY AS NEEDED FOR MUSCLE SPASM   Magnesium 500 MG TABS Take by mouth. Takes 2 tablets daily   Multiple Vitamin (MULTIVITAMIN) tablet Take 1 tablet by mouth daily.   Omega-3 Fatty Acids (FISH OIL) 1000 MG CAPS Take by mouth daily.   Probiotic Product (PROBIOTIC DAILY PO) Take 1 tablet by mouth daily.   telmisartan (MICARDIS) 20 MG tablet TAKE 1 TABLET DAILY FOR BLOOD PRESSURE   [DISCONTINUED] apixaban (ELIQUIS) 5 MG TABS tablet Take 1 tablet (5 mg total) by mouth 2 (two) times daily.    Allergies:   Rosuvastatin   Social History   Occupational History   Occupation: Therapist, sports  Tobacco Use   Smoking status: Former    Types: Cigarettes, E-cigarettes    Quit date: 04/18/2012    Years since quitting: 9.6   Smokeless tobacco: Never   Tobacco comments:    e-cigarettes  Vaping Use   Vaping Use: Never used  Substance and Sexual Activity   Alcohol use: Yes    Comment: 3-4 glasses of wine per week   Drug use: No   Sexual activity: Not on file    Family Hx: The patient's family history includes Atrial fibrillation in her mother; Barrett's esophagus in her mother; Clotting disorder in her father; Hypertension in her father and mother; Lung cancer in her father; Mitral valve prolapse in her mother; Thyroid disease in her father. There is no history of Colon cancer, Diabetes, Esophageal cancer, Pancreatic cancer, or Stomach cancer.  ROS   EKGs/Labs/Other Test Reviewed:    EKG:  EKG is not ordered today.     Recent Labs: 07/30/2021: Magnesium 2.1 12/09/2021: ALT 16; BUN 16; Creat 0.81; Hemoglobin 14.3;  Platelets 277; Potassium 4.1; Sodium 140; TSH 1.53   Recent Lipid Panel Recent Labs    12/09/21 1704  CHOL 200*  TRIG 90  HDL 87  LDLCALC 95      Risk Assessment/Calculations/Metrics:    CHA2DS2-VASc Score = 4   This indicates a 4.8% annual risk of stroke. The patient's score is based upon: CHF History: 0 HTN History: 1 Diabetes History: 0 Stroke History: 0 Vascular Disease History: 1 Age Score: 1 Gender Score: 1        HYPERTENSION CONTROL Vitals:   12/10/21 1349 12/10/21 1817  BP: (!) 128/98 (!) 140/90    The patient's blood pressure is elevated above target today.  In  order to address the patient's elevated BP: Blood pressure will be monitored at home to determine if medication changes need to be made.       Physical Exam:    VS:  BP (!) 140/90   Pulse 88   Ht '5\' 8"'$  (1.727 m)   Wt 187 lb 9.6 oz (85.1 kg)   SpO2 97%   BMI 28.52 kg/m     Wt Readings from Last 3 Encounters:  12/10/21 187 lb 9.6 oz (85.1 kg)  12/09/21 189 lb 6.4 oz (85.9 kg)  07/30/21 183 lb (83 kg)    Constitutional:      Appearance: Healthy appearance. Not in distress.  Neck:     Thyroid: No thyromegaly.     Vascular: JVD normal.  Pulmonary:     Effort: Pulmonary effort is normal.     Breath sounds: No wheezing. No rales.  Cardiovascular:     Normal rate. Irregularly irregular rhythm. Normal S1. Normal S2.      Murmurs: There is no murmur.  Edema:    Peripheral edema absent.  Abdominal:     Palpations: Abdomen is soft.  Skin:    General: Skin is warm and dry.  Neurological:     Mental Status: Alert and oriented to person, place and time.          ASSESSMENT & PLAN:   PAF (paroxysmal atrial fibrillation) ([place]) She has likely been in atrial fibrillation since late October when she developed COVID.  It is not clear if her atrial fibrillation is completely related to COVID.  She does have a history of atrial fibrillation in the past.  She was taken off of anticoagulation for  unclear reasons at that time.  She does have significant thromboembolic risk factors and requires long-term anticoagulation.  She is symptomatic with fatigue and shortness of breath.  She does not have any evidence of heart failure on exam.  Her heart rate is controlled.  She had evidence of Mobitz type I on the monitor in the past and her beta-blocker was stopped by Dr. Rayann Heman secondary to this.  She was given a prescription for diltiazem yesterday but has not yet started it.  I have advised her to not start this medication given her prior history and controlled heart rate.  We discussed the rationale for 3 weeks of anticoagulation and proceeding with cardioversion versus proceeding directly to TEE guided cardioversion.  She prefers to proceed with TEE guided cardioversion.  I reviewed this with Dr. Tamala Julian (attending MD) who agreed. Continue Eliquis 5 mg twice daily DC diltiazem Arrange TEE guided cardioversion Friday of this week or early next week Follow-up in the atrial fibrillation clinic post cardioversion Follow-up with Dr. Gwenlyn Found in 3 months  Essential hypertension Blood pressure elevated here.  Her blood pressure has been optimal in the past.  I have asked her to monitor her blood pressure over the next couple weeks and send those readings for review through [facility].  Continue telmisartan 20 mg daily.  Hyperlipidemia She has been intolerant to statins in the past.  She stopped taking Zetia due to arthralgias.  I have recommended that she resume Zetia after cardioversion.  If she has recurrent side effects, we can refer her to the Pharm.D. clinic for consideration of PCSK9 inhibitor therapy.  Thoracic aortic aneurysm (HCC) 4.3 cm on CT in September 2022.  Her aneurysm is monitored by serial CT scans arranged through primary care.        Shared Decision Making/Informed  Consent The risks [stroke, cardiac arrhythmias rarely resulting in the need for a temporary or permanent pacemaker, skin  irritation or burns, esophageal damage, perforation (1:10,000 risk), bleeding, pharyngeal hematoma as well as other potential complications associated with conscious sedation including aspiration, arrhythmia, respiratory failure and death], benefits (treatment guidance, restoration of normal sinus rhythm, diagnostic support) and alternatives of a transesophageal echocardiogram guided cardioversion were discussed in detail with Ms. Deskins and she is willing to proceed.   Dispo:  Return in about 3 months (around 03/12/2022) for Routine Follow Up w/ Dr. Gwenlyn Found.   Medication Adjustments/Labs and Tests Ordered: Current medicines are reviewed at length with the patient today.  Concerns regarding medicines are outlined above.  Tests Ordered: Orders Placed This Encounter  Procedures   ECHOCARDIOGRAM COMPLETE   Medication Changes: Meds ordered this encounter  Medications   apixaban (ELIQUIS) 5 MG TABS tablet    Sig: Take 1 tablet (5 mg total) by mouth 2 (two) times daily.    Dispense:  180 tablet    Refill:  3   Signed, Richardson Dopp, PA-C  12/10/2021 6:20 PM    [address], [place], [place]  [address] Phone: [phone]; Fax: [phone]

## 2021-12-10 NOTE — Assessment & Plan Note (Signed)
She has been intolerant to statins in the past.  She stopped taking Zetia due to arthralgias.  I have recommended that she resume Zetia after cardioversion.  If she has recurrent side effects, we can refer her to the Pharm.D. clinic for consideration of PCSK9 inhibitor therapy.

## 2021-12-11 ENCOUNTER — Ambulatory Visit: Payer: PPO | Admitting: Physician Assistant

## 2021-12-13 ENCOUNTER — Encounter (HOSPITAL_COMMUNITY): Payer: Self-pay | Admitting: Cardiovascular Disease

## 2021-12-13 ENCOUNTER — Ambulatory Visit (HOSPITAL_COMMUNITY): Payer: PPO | Admitting: Anesthesiology

## 2021-12-13 ENCOUNTER — Ambulatory Visit (HOSPITAL_COMMUNITY)
Admission: RE | Admit: 2021-12-13 | Discharge: 2021-12-13 | Disposition: A | Discharge status: Home / Self Care | Payer: PPO | Attending: Cardiovascular Disease | Admitting: Cardiovascular Disease

## 2021-12-13 ENCOUNTER — Encounter (HOSPITAL_COMMUNITY)
Admission: RE | Disposition: A | Discharge status: Home / Self Care | Payer: Self-pay | Source: Home / Self Care | Attending: Cardiovascular Disease

## 2021-12-13 ENCOUNTER — Ambulatory Visit (HOSPITAL_BASED_OUTPATIENT_CLINIC_OR_DEPARTMENT_OTHER)
Admission: RE | Admit: 2021-12-13 | Discharge: 2021-12-13 | Disposition: A | Discharge status: Home / Self Care | Payer: PPO | Source: Home / Self Care | Attending: Physician Assistant | Admitting: Physician Assistant

## 2021-12-13 ENCOUNTER — Ambulatory Visit (HOSPITAL_BASED_OUTPATIENT_CLINIC_OR_DEPARTMENT_OTHER): Payer: PPO | Admitting: Anesthesiology

## 2021-12-13 DIAGNOSIS — I480 Paroxysmal atrial fibrillation: Secondary | ICD-10-CM | POA: Diagnosis not present

## 2021-12-13 DIAGNOSIS — I10 Essential (primary) hypertension: Secondary | ICD-10-CM | POA: Diagnosis not present

## 2021-12-13 DIAGNOSIS — I4891 Unspecified atrial fibrillation: Secondary | ICD-10-CM

## 2021-12-13 DIAGNOSIS — Z87891 Personal history of nicotine dependence: Secondary | ICD-10-CM | POA: Diagnosis not present

## 2021-12-13 DIAGNOSIS — E785 Hyperlipidemia, unspecified: Secondary | ICD-10-CM | POA: Diagnosis not present

## 2021-12-13 DIAGNOSIS — J449 Chronic obstructive pulmonary disease, unspecified: Secondary | ICD-10-CM | POA: Insufficient documentation

## 2021-12-13 DIAGNOSIS — Z79899 Other long term (current) drug therapy: Secondary | ICD-10-CM | POA: Diagnosis not present

## 2021-12-13 DIAGNOSIS — I082 Rheumatic disorders of both aortic and tricuspid valves: Secondary | ICD-10-CM | POA: Diagnosis not present

## 2021-12-13 DIAGNOSIS — I7121 Aneurysm of the ascending aorta, without rupture: Secondary | ICD-10-CM | POA: Diagnosis not present

## 2021-12-13 DIAGNOSIS — Z7901 Long term (current) use of anticoagulants: Secondary | ICD-10-CM | POA: Diagnosis not present

## 2021-12-13 HISTORY — PX: TEE WITHOUT CARDIOVERSION: SHX5443

## 2021-12-13 HISTORY — PX: CARDIOVERSION: SHX1299

## 2021-12-13 SURGERY — ECHOCARDIOGRAM, TRANSESOPHAGEAL
Anesthesia: General

## 2021-12-13 MED ORDER — PROPOFOL 10 MG/ML IV BOLUS
INTRAVENOUS | Status: DC | PRN
  Administered 2021-12-13: 30 mg via INTRAVENOUS
  Administered 2021-12-13: 40 mg via INTRAVENOUS
  Administered 2021-12-13: 20 mg via INTRAVENOUS
  Administered 2021-12-13: 10 mg via INTRAVENOUS
  Administered 2021-12-13: 30 mg via INTRAVENOUS
  Administered 2021-12-13: 20 mg via INTRAVENOUS

## 2021-12-13 MED ORDER — SODIUM CHLORIDE 0.9 % IV SOLN: INTRAVENOUS | Status: DC

## 2021-12-13 MED ORDER — LIDOCAINE 2% (20 MG/ML) 5 ML SYRINGE
INTRAMUSCULAR | Status: DC | PRN
  Administered 2021-12-13: 100 mg via INTRAVENOUS

## 2021-12-13 MED ORDER — PROPOFOL 500 MG/50ML IV EMUL
INTRAVENOUS | Status: DC | PRN
  Administered 2021-12-13: 150 ug/kg/min via INTRAVENOUS

## 2021-12-13 NOTE — Anesthesia Preprocedure Evaluation (Signed)
Anesthesia Evaluation  Patient identified by MRN, date of birth, ID band Patient awake    Reviewed: Allergy & Precautions, NPO status , Patient's Chart, lab work & pertinent test results  Airway Mallampati: II  TM Distance: >3 FB Neck ROM: Full    Dental no notable dental hx. (+) Teeth Intact, Caps, Dental Advisory Given   Pulmonary COPD,  COPD inhaler, former smoker   breath sounds clear to auscultation + decreased breath sounds      Cardiovascular hypertension, Pt. on medications + dysrhythmias Atrial Fibrillation  Rhythm:Irregular Rate:Normal  Ascending aortic aneurysm 4.3cm 2021   Neuro/Psych  PSYCHIATRIC DISORDERS Anxiety Depression    negative neurological ROS     GI/Hepatic negative GI ROS, Neg liver ROS,,,  Endo/Other  Hyperlipidemia  Renal/GU negative Renal ROS  negative genitourinary   Musculoskeletal  (+) Arthritis , Osteoarthritis,    Abdominal   Peds  Hematology Eliquis therapy-last dose this am   Anesthesia Other Findings   Reproductive/Obstetrics                              Anesthesia Physical Anesthesia Plan  ASA: 3  Anesthesia Plan: General   Post-op Pain Management: Minimal or no pain anticipated   Induction: Intravenous  PONV Risk Score and Plan: 3 and Treatment may vary due to age or medical condition and Propofol infusion  Airway Management Planned: Natural Airway and Mask  Additional Equipment:   Intra-op Plan:   Post-operative Plan:   Informed Consent: I have reviewed the patients History and Physical, chart, labs and discussed the procedure including the risks, benefits and alternatives for the proposed anesthesia with the patient or authorized representative who has indicated his/her understanding and acceptance.     Dental advisory given  Plan Discussed with: CRNA and Anesthesiologist  Anesthesia Plan Comments:          Anesthesia Quick  Evaluation

## 2021-12-13 NOTE — Progress Notes (Signed)
Echocardiogram Echocardiogram Transesophageal has been performed.  Fidel Levy 12/13/2021, 8:52 AM

## 2021-12-13 NOTE — Transfer of Care (Signed)
Immediate Anesthesia Transfer of Care Note  Patient: Taylor Li  Procedure(s) Performed: TRANSESOPHAGEAL ECHOCARDIOGRAM (TEE) CARDIOVERSION  Patient Location: Short Stay  Anesthesia Type:MAC  Level of Consciousness: drowsy  Airway & Oxygen Therapy: Patient Spontanous Breathing and Patient connected to nasal cannula oxygen  Post-op Assessment: Report given to RN and Post -op Vital signs reviewed and stable  Post vital signs: Reviewed and stable  Last Vitals:  Vitals Value Taken Time  BP 92/56 12/13/21 0840  Temp    Pulse 90 12/13/21 0842  Resp 24 12/13/21 0842  SpO2 92 % 12/13/21 0842  Vitals shown include unvalidated device data.  Last Pain:  Vitals:   12/13/21 0653  TempSrc: Oral  PainSc:          Complications: No notable events documented.

## 2021-12-13 NOTE — Discharge Instructions (Signed)
TEE  YOU HAD AN CARDIAC PROCEDURE TODAY: Refer to the procedure report and other information in the discharge instructions given to you for any specific questions about what was found during the examination. If this information does not answer your questions, please call [place] office at [phone] to clarify.   DIET: Your first meal following the procedure should be a light meal and then it is ok to progress to your normal diet. A half-sandwich or bowl of soup is an example of a good first meal. Heavy or fried foods are harder to digest and may make you feel nauseous or bloated. Drink plenty of fluids but you should avoid alcoholic beverages for 24 hours. If you had a esophageal dilation, please see attached instructions for diet.   ACTIVITY: Your care partner should take you home directly after the procedure. You should plan to take it easy, moving slowly for the rest of the day. You can resume normal activity the day after the procedure however YOU SHOULD NOT DRIVE, use power tools, machinery or perform tasks that involve climbing or major physical exertion for 24 hours (because of the sedation medicines used during the test).   SYMPTOMS TO REPORT IMMEDIATELY: A cardiologist can be reached at any hour. Please call [phone] for any of the following symptoms:  Vomiting of blood or coffee ground material  New, significant abdominal pain  New, significant chest pain or pain under the shoulder blades  Painful or persistently difficult swallowing  New shortness of breath  Black, tarry-looking or red, bloody stools  FOLLOW UP:  Please also call with any specific questions about appointments or follow up tests. Electrical Cardioversion  Electrical cardioversion is the delivery of a jolt of electricity to restore a normal rhythm to the heart. A rhythm that is too fast or is not regular keeps the heart from pumping well. In this procedure, sticky patches or metal paddles are placed on the  chest to deliver electricity to the heart from a device.  If this information does not answer your questions, please call [place] office at [phone] to clarify.   Follow these instructions at home: You may have some redness on the skin where the shocks were given.  You may apply over-the-counter hydrocortisone cream or aloe vera to alleviate skin irritation. YOU SHOULD NOT DRIVE, use power tools, machinery or perform tasks that involve climbing or major physical exertion for 24 hours (because of the sedation medicines used during the test).  Take over-the-counter and prescription medicines only as told by your health care provider. Ask your health care provider how to check your pulse. Check it often. Rest for 48 hours after the procedure or as told by your health care provider. Avoid or limit your caffeine use as told by your health care provider. Keep all follow-up visits as told by your health care provider. This is important.  FOLLOW UP:  Please also call with any specific questions about appointments or follow up tests. Electrical Cardioversion  Electrical cardioversion is the delivery of a jolt of electricity to restore a normal rhythm to the heart. A rhythm that is too fast or is not regular keeps the heart from pumping well. In this procedure, sticky patches or metal paddles are placed on the chest to deliver electricity to the heart from a device.  If this information does not answer your questions, please call [place] office at [phone] to clarify.   Follow these  instructions at home: You may have some redness on the skin where the shocks were given.  You may apply over-the-counter hydrocortisone cream or aloe vera to alleviate skin irritation. YOU SHOULD NOT DRIVE, use power tools, machinery or perform tasks that involve climbing or major physical exertion for 24 hours (because of the sedation medicines used during the  test).  Take over-the-counter and prescription medicines only as told by your health care provider. Ask your health care provider how to check your pulse. Check it often. Rest for 48 hours after the procedure or as told by your health care provider. Avoid or limit your caffeine use as told by your health care provider. Keep all follow-up visits as told by your health care provider. This is important.  FOLLOW UP:  Please also call with any specific questions about appointments or follow up tests.

## 2021-12-13 NOTE — CV Procedure (Signed)
Brief TEE Note  LVEF 60-65% No LA/LAA thrombus or masses Trivial AR Mild TR  For additional details see full report.  Electrical Cardioversion Procedure Note FELMA PFEFFERLE [id number] 1950/04/09  Procedure: Electrical Cardioversion Indications:  Atrial Fibrillation  Procedure Details Consent: Risks of procedure as well as the alternatives and risks of each were explained to the (patient/caregiver).  Consent for procedure obtained. Time Out: Verified patient identification, verified procedure, site/side was marked, verified correct patient position, special equipment/implants available, medications/allergies/relevent history reviewed, required imaging and test results available.  Performed  Patient placed on cardiac monitor, pulse oximetry, supplemental oxygen as necessary.  Sedation given:  propofol Pacer pads placed anterior and posterior chest.  Cardioverted 2 time(s).  Cardioverted at 150J unsuccessful.  200J successful.  Evaluation Findings: Post procedure EKG shows: NSR Complications: None Patient did tolerate procedure well.   Skeet Latch, MD 12/13/2021, 8:37 AM

## 2021-12-13 NOTE — Anesthesia Procedure Notes (Signed)
Procedure Name: General with mask airway Date/Time: 12/13/2021 8:24 AM  Performed by: Erick Colace, CRNAPre-anesthesia Checklist: Patient identified, Emergency Drugs available, Suction available and Patient being monitored Patient Re-evaluated:Patient Re-evaluated prior to induction Oxygen Delivery Method: Nasal cannula Preoxygenation: Pre-oxygenation with 100% oxygen Induction Type: IV induction Airway Equipment and Method: Bite block Dental Injury: Teeth and Oropharynx as per pre-operative assessment

## 2021-12-13 NOTE — Interval H&P Note (Signed)
History and Physical Interval Note:  12/13/2021 8:10 AM  Taylor Li  has presented today for surgery, with the diagnosis of afib.  The various methods of treatment have been discussed with the patient and family. After consideration of risks, benefits and other options for treatment, the patient has consented to  Procedure(s): TRANSESOPHAGEAL ECHOCARDIOGRAM (TEE) (N/A) CARDIOVERSION (N/A) as a surgical intervention.  The patient's history has been reviewed, patient examined, no change in status, stable for surgery.  I have reviewed the patient's chart and labs.  Questions were answered to the patient's satisfaction.     Skeet Latch, MD

## 2021-12-13 NOTE — Anesthesia Postprocedure Evaluation (Signed)
Anesthesia Post Note  Patient: Taylor Li  Procedure(s) Performed: TRANSESOPHAGEAL ECHOCARDIOGRAM (TEE) CARDIOVERSION     Patient location during evaluation: PACU Anesthesia Type: General Level of consciousness: awake and alert and oriented Pain management: pain level controlled Vital Signs Assessment: post-procedure vital signs reviewed and stable Respiratory status: spontaneous breathing, nonlabored ventilation and respiratory function stable Cardiovascular status: blood pressure returned to baseline and stable Postop Assessment: no apparent nausea or vomiting Anesthetic complications: no   No notable events documented.  Last Vitals:  Vitals:   12/13/21 0841 12/13/21 0846  BP:    Pulse: 94 90  Resp: 20 (!) 23  Temp:    SpO2: (!) 89% 94%    Last Pain:  Vitals:   12/13/21 0840  TempSrc: Temporal  PainSc: Asleep                 [name]

## 2021-12-14 ENCOUNTER — Telehealth: Payer: Self-pay | Admitting: Home Health

## 2021-12-14 NOTE — Telephone Encounter (Signed)
Patient called after hour line, states she felt some "pauses" of her heart rate, that she believes it's beating irregularly, otherwise feeling okay. She has had cardioversion 11/13/21. She is concerned of her BP being elevated, reports that her BP 136/99 taken at 2pm , 143/96 taken 1pm, 138/94 taken last night. Overall BP control is adequate, reassured the patient,  if remains elevated on trend, may need adjustment of antihypertensive, no change for now. She has an appointment with A-fib clinic on 12/25/2021, can reevaluate recurrence of A-fib.  She is agreeable with plan.

## 2021-12-16 ENCOUNTER — Encounter (HOSPITAL_COMMUNITY): Payer: Self-pay | Admitting: Cardiovascular Disease

## 2021-12-16 ENCOUNTER — Encounter: Payer: Self-pay | Admitting: Internal Medicine

## 2021-12-16 DIAGNOSIS — M19211 Secondary osteoarthritis, right shoulder: Secondary | ICD-10-CM | POA: Diagnosis not present

## 2021-12-16 DIAGNOSIS — M19212 Secondary osteoarthritis, left shoulder: Secondary | ICD-10-CM | POA: Diagnosis not present

## 2021-12-16 NOTE — Telephone Encounter (Signed)
Agree. Have pt collect BP readings x 1 week (check once a day) and send to me via MyChart so we can decide if BP meds need to be adjusted. Richardson Dopp, PA-C    12/16/2021 8:11 AM

## 2021-12-16 NOTE — Telephone Encounter (Signed)
Called patient to let her know of [employer]. Patient agreed to take BP daily for a week and send through mychart to [employer] PA.

## 2021-12-17 ENCOUNTER — Emergency Department (HOSPITAL_COMMUNITY): Payer: PPO

## 2021-12-17 ENCOUNTER — Emergency Department (HOSPITAL_COMMUNITY)
Admission: EM | Admit: 2021-12-17 | Discharge: 2021-12-17 | Disposition: A | Discharge status: Home / Self Care | Payer: PPO | Attending: Emergency Medicine | Admitting: Emergency Medicine

## 2021-12-17 ENCOUNTER — Telehealth: Payer: Self-pay | Admitting: Physician Assistant

## 2021-12-17 ENCOUNTER — Other Ambulatory Visit: Payer: Self-pay

## 2021-12-17 ENCOUNTER — Encounter (HOSPITAL_COMMUNITY): Payer: Self-pay | Admitting: Emergency Medicine

## 2021-12-17 ENCOUNTER — Encounter: Payer: Self-pay | Admitting: Nurse Practitioner

## 2021-12-17 DIAGNOSIS — R079 Chest pain, unspecified: Secondary | ICD-10-CM | POA: Diagnosis not present

## 2021-12-17 DIAGNOSIS — Z79899 Other long term (current) drug therapy: Secondary | ICD-10-CM | POA: Insufficient documentation

## 2021-12-17 DIAGNOSIS — Z7901 Long term (current) use of anticoagulants: Secondary | ICD-10-CM | POA: Insufficient documentation

## 2021-12-17 DIAGNOSIS — R002 Palpitations: Secondary | ICD-10-CM | POA: Diagnosis not present

## 2021-12-17 DIAGNOSIS — I10 Essential (primary) hypertension: Secondary | ICD-10-CM | POA: Insufficient documentation

## 2021-12-17 DIAGNOSIS — I480 Paroxysmal atrial fibrillation: Secondary | ICD-10-CM | POA: Diagnosis not present

## 2021-12-17 DIAGNOSIS — I4891 Unspecified atrial fibrillation: Secondary | ICD-10-CM | POA: Diagnosis not present

## 2021-12-17 DIAGNOSIS — G4489 Other headache syndrome: Secondary | ICD-10-CM | POA: Diagnosis not present

## 2021-12-17 DIAGNOSIS — F419 Anxiety disorder, unspecified: Secondary | ICD-10-CM | POA: Insufficient documentation

## 2021-12-17 DIAGNOSIS — R231 Pallor: Secondary | ICD-10-CM | POA: Diagnosis not present

## 2021-12-17 LAB — BASIC METABOLIC PANEL
Anion gap: 8 (ref 5–15)
BUN: 19 mg/dL (ref 8–23)
CO2: 22 mmol/L (ref 22–32)
Calcium: 8.9 mg/dL (ref 8.9–10.3)
Chloride: 105 mmol/L (ref 98–111)
Creatinine, Ser: 0.84 mg/dL (ref 0.44–1.00)
GFR, Estimated: >60 mL/min (ref >60)
Glucose, Bld: 156 mg/dL — ABNORMAL HIGH (ref 70–99)
Potassium: 4.2 mmol/L (ref 3.5–5.1)
Sodium: 135 mmol/L (ref 135–145)

## 2021-12-17 LAB — CBC
HCT: 43.3 % (ref 36.0–46.0)
Hemoglobin: 14.6 g/dL (ref 12.0–15.0)
MCH: 30.4 pg (ref 26.0–34.0)
MCHC: 33.7 g/dL (ref 30.0–36.0)
MCV: 90.2 fL (ref 80.0–100.0)
Platelets: 280 10*3/uL (ref 150–400)
RBC: 4.8 MIL/uL (ref 3.87–5.11)
RDW: 12.8 % (ref 11.5–15.5)
WBC: 6.2 10*3/uL (ref 4.0–10.5)
nRBC: 0 % (ref 0.0–0.2)

## 2021-12-17 LAB — TROPONIN I (HIGH SENSITIVITY)
Troponin I (High Sensitivity): 4 ng/L (ref <18)
Troponin I (High Sensitivity): 5 ng/L (ref <18)

## 2021-12-17 LAB — MAGNESIUM: Magnesium: 1.8 mg/dL (ref 1.7–2.4)

## 2021-12-17 MED ORDER — LORAZEPAM 2 MG/ML IJ SOLN
0.5000 mg | Freq: Once | INTRAMUSCULAR | Status: AC
  Administered 2021-12-17: 0.5 mg via INTRAVENOUS
  Filled 2021-12-17: qty 1

## 2021-12-17 MED ORDER — DIPHENHYDRAMINE HCL 50 MG/ML IJ SOLN
12.5000 mg | Freq: Once | INTRAMUSCULAR | Status: AC
  Administered 2021-12-17: 12.5 mg via INTRAVENOUS
  Filled 2021-12-17: qty 1

## 2021-12-17 MED ORDER — METOPROLOL TARTRATE 25 MG PO TABS: 25.0000 mg | ORAL_TABLET | Freq: Two times a day (BID) | ORAL | 0 refills | Status: DC | PRN

## 2021-12-17 MED ORDER — PROCHLORPERAZINE EDISYLATE 10 MG/2ML IJ SOLN
5.0000 mg | Freq: Once | INTRAMUSCULAR | Status: AC
  Administered 2021-12-17: 5 mg via INTRAVENOUS
  Filled 2021-12-17: qty 2

## 2021-12-17 MED ORDER — SODIUM CHLORIDE 0.9 % IV BOLUS
1000.0000 mL | Freq: Once | INTRAVENOUS | Status: AC
  Administered 2021-12-17: 1000 mL via INTRAVENOUS

## 2021-12-17 NOTE — Telephone Encounter (Signed)
Advised of [employer] recommendation. Pt scheduled to see Dr. Curt Bears (EP) this Thursday for further evaluation/discussion. Pt agreeable to plan.

## 2021-12-17 NOTE — Telephone Encounter (Signed)
Pt reports going to ED early this morning d/t AFib episode, 2nd episode this month.  Pt had TEE/DCCV on 12/1 for afib. States ED MD did start her on a daily rate controlling medication -- ED MD wanted cardiology to advise on if pt should take a daily BB vs CCB.   They sent pt home w/ 5 Lopressor tablets to take BID PRN for HRs greater than 120 bpm. Pt states she is monitoring her BPs at home and is supposed to send a log to [employer] later this week -- but avg normal/high numbers at home.  Pt aware I will send this to Dr. Gwenlyn Found & Richardson Dopp for advisement on medication. Pt is scheduled to see AFib clinic next week for post hosp follow up.

## 2021-12-17 NOTE — ED Notes (Signed)
Pt ambulated to restroom without difficulty

## 2021-12-17 NOTE — ED Provider Notes (Signed)
[facility] EMERGENCY DEPARTMENT Provider Note   CSN: [id number] Arrival date & time: 12/17/21  0254     History  Chief Complaint  Patient presents with   Palpitations    Taylor Li is a 71 y.o. female.  HPI     This is a 71 year old female with a history of atrial fibrillation who presents with palpitations.  Patient reports that she had a cardioversion on Friday.  Today she has not felt like herself and has had intermittent episodes of palpitations.  She also states she has had a headache most of the day which is unusual for her.  She took 2 Tylenol with minimal relief.  She went to lay down and began to feel like she was "smothered."  In route, EMS noted heart rate in the 160s to 200s.  She was given 6 mg of adenosine and converted to as tachycardia with potentially a brief episode of atrial flutter.  Currently she reports anxiety.  No chest pain.  No shortness of breath.  Home Medications Prior to Admission medications   Medication Sig Start Date End Date Taking? Authorizing Provider  metoprolol tartrate (LOPRESSOR) 25 MG tablet Take 1 tablet (25 mg total) by mouth 2 (two) times daily as needed (HR >120). 12/17/21  Yes Seena Ritacco, Barbette Hair, MD  ALPRAZolam (XANAX) 1 MG tablet TAKE 1/2-1 TABLET 2-3 TIMES A DAY ONLY IF NEEDED FOR PANIC OR ANXIETY ATTACK & LIMIT TO 5 DAYS /WEEK TO AVOID ADDICTION & DEMENTIA Patient taking differently: Take 0.5-1 mg by mouth at bedtime. 10/10/21   Alycia Rossetti, NP  apixaban (ELIQUIS) 5 MG TABS tablet Take 1 tablet (5 mg total) by mouth 2 (two) times daily. 12/10/21   Richardson Dopp T, PA-C  Ascorbic Acid (VITAMIN C) 1000 MG tablet Take 2,000 mg by mouth daily.    [provider]  BLACK PEPPER-TURMERIC PO Take 3 tablets by mouth daily.    [provider]  buPROPion (WELLBUTRIN XL) 300 MG 24 hr tablet TAKE 1 TABLET DAILY FOR MOOD, FOCUS & CONCENTRATION 12/02/21   Alycia Rossetti, NP  Cholecalciferol (DIALYVITE  VITAMIN D 5000) 125 MCG (5000 UT) capsule Take 5,000 Units by mouth daily.    [provider]  citalopram (CELEXA) 40 MG tablet TAKE 1 TABLET BY MOUTH DAILY FOR MOOD 03/06/21   Liane Comber, NP  cyclobenzaprine (FLEXERIL) 10 MG tablet TAKE 1/2 TO 1 TABLET 2 TO 3 X /DAY AS NEEDED FOR MUSCLE SPASM Patient taking differently: Take 5-10 mg by mouth at bedtime. 12/09/21   Unk Pinto, MD  ibuprofen (ADVIL) 200 MG tablet Take 800 mg by mouth every 8 (eight) hours as needed for moderate pain.    [provider]  Magnesium 500 MG TABS Take 1,000 mg by mouth daily. Takes 2 tablets daily    [provider]  Menthol, Topical Analgesic, (BIOFREEZE EX) Apply 1 Application topically daily as needed (pain).    [provider]  Multiple Vitamin (MULTIVITAMIN) tablet Take 1 tablet by mouth daily.    [provider]  Omega-3 Fatty Acids (FISH OIL) 1000 MG CAPS Take 1,000 mg by mouth daily.    [provider]  OVER THE COUNTER MEDICATION Apply 1 Application topically daily as needed (pain). CBD roll on    [provider]  OVER THE COUNTER MEDICATION Apply 1 Application topically daily as needed (pain). pomada de coca plus marijuana cream    [provider]  Polyvinyl Alcohol-Povidone ([place] OP)  Place 1 drop into both eyes daily as needed (dry eyes).    [provider]  saccharomyces boulardii (FLORASTOR) 250 MG capsule Take 500 mg by mouth daily.    [provider]  Skin Oils (CASTOR OIL EX) Apply 1 Application topically daily as needed (pain).    [provider]  telmisartan (MICARDIS) 20 MG tablet TAKE 1 TABLET DAILY FOR BLOOD PRESSURE 05/04/21   Unk Pinto, MD      Allergies    Rosuvastatin and Other    Review of Systems   Review of Systems  Constitutional:  Negative for fever.  Respiratory:  Negative for shortness of breath.   Cardiovascular:  Positive for palpitations. Negative for chest pain.   Neurological:  Positive for headaches.  All other systems reviewed and are negative.   Physical Exam Updated Vital Signs BP (!) 146/94   Pulse 87   Temp 97.6 F (36.4 C) (Oral)   Resp 12   Ht 1.727 m ('5\' 8"'$ )   Wt 84.8 kg   SpO2 97%   BMI 28.43 kg/m  Physical Exam Vitals and nursing note reviewed.  Constitutional:      Appearance: She is well-developed. She is not ill-appearing or diaphoretic.     Comments: Anxious appearing but nontoxic  HENT:     Head: Normocephalic and atraumatic.  Eyes:     Pupils: Pupils are equal, round, and reactive to light.  Cardiovascular:     Rate and Rhythm: Regular rhythm. Tachycardia present.     Heart sounds: Normal heart sounds.  Pulmonary:     Effort: Pulmonary effort is normal. No respiratory distress.     Breath sounds: No wheezing.  Abdominal:     Palpations: Abdomen is soft.     Tenderness: There is no abdominal tenderness.  Musculoskeletal:     Cervical back: Neck supple.     Right lower leg: No edema.     Left lower leg: No edema.  Skin:    General: Skin is warm and dry.  Neurological:     Mental Status: She is alert and oriented to person, place, and time.  Psychiatric:     Comments: Anxious     ED Results / Procedures / Treatments   Labs (all labs ordered are listed, but only abnormal results are displayed) Labs Reviewed  BASIC METABOLIC PANEL - Abnormal; Notable for the following components:      Result Value   Glucose, Bld 156 (*)    All other components within normal limits  MAGNESIUM  CBC  TROPONIN I (HIGH SENSITIVITY)  TROPONIN I (HIGH SENSITIVITY)    EKG EKG Interpretation  Date/Time:  Tuesday December 17 2021 03:38:52 EST Ventricular Rate:  94 PR Interval:  254 QRS Duration: 103 QT Interval:  439 QTC Calculation: 549 R Axis:   -71 Text Interpretation: Sinus or ectopic atrial rhythm Prolonged PR interval Consider left atrial enlargement Left anterior fascicular block Anterior infarct, old Prolonged  QT interval 12 Lead; [place]ar Confirmed by Thayer Jew [phone]) on 12/17/2021 4:06:22 AM  Radiology DG Chest Port 1 View  Result Date: 12/17/2021 CLINICAL DATA:  Chest pain. EXAM: PORTABLE CHEST 1 VIEW COMPARISON:  09/04/2016. FINDINGS: The heart size and mediastinal contours are within normal limits. Mild airspace disease is noted at the left lung base. No consolidation, effusion, or pneumothorax. Scoliosis and degenerative changes are noted in the thoracic spine. IMPRESSION: Patchy airspace disease at the left lung base, possible atelectasis or infiltrate. Electronically Signed   By: Mickel Baas  Lovena Le M.D.   On: 12/17/2021 03:26    Procedures Procedures    Medications Ordered in ED Medications  sodium chloride 0.9 % bolus 1,000 mL (0 mLs Intravenous Stopped 12/17/21 0445)  prochlorperazine (COMPAZINE) injection 5 mg (5 mg Intravenous Given 12/17/21 0330)  diphenhydrAMINE (BENADRYL) injection 12.5 mg (12.5 mg Intravenous Given 12/17/21 0332)  LORazepam (ATIVAN) injection 0.5 mg (0.5 mg Intravenous Given 12/17/21 0445)    ED Course/ Medical Decision Making/ A&P                           Medical Decision Making Amount and/or Complexity of Data Reviewed Labs: ordered. Radiology: ordered.  Risk Prescription drug management.   This patient presents to the ED for concern of palpitations, headache, this involves an extensive number of treatment options, and is a complaint that carries with it a high risk of complications and morbidity.  I considered the following differential and admission for this acute, potentially life threatening condition.  The differential diagnosis includes arrhythmia, metabolic derangement, ACS, tension headache, migraine  MDM:    This is a 71 year old female who presents with palpitations.  Per EMS she was in the 160s to 200s.  They report that she was in SVT and converted with adenosine.  Question whether she may have been in rapid atrial fibrillation with a  abberancy.  By the time I evaluated the patient, she was in sinus rhythm.  She did have prolonged PR and QT intervals.  Otherwise her heart rate settled out in the 80s.  Labs obtained and reviewed.  Metabolites normal.  Magnesium normal.  Troponin x2 normal.  Doubt primary ACS.  Chest x-ray is reassuring.  She did receive a migraine cocktail for her headache and had full resolution of symptoms.  On recheck, she is much more comfortable and asymptomatic.  Discussed with her that given her interval prolongation, would defer to cardiology for rate control medication.  I feel that it is highly likely that she had an atrial fibrillation recurrence.  I will give her a as needed metoprolol prescription.  I also messaged cardiology PA Kathlen Mody.  Patient and her husband both stated understanding and were agreeable to plan.  (Labs, imaging, consults)  Labs: I Ordered, and personally interpreted labs.  The pertinent results include: CBC, BMP, troponin x2, magnesium  Imaging Studies ordered: I ordered imaging studies including chest x-ray I independently visualized and interpreted imaging. I agree with the radiologist interpretation  Additional history obtained from husband at bedside.  External records from outside source obtained and reviewed including cardiology notes  Cardiac Monitoring: The patient was maintained on a cardiac monitor.  I personally viewed and interpreted the cardiac monitored which showed an underlying rhythm of: Sinus rhythm  Reevaluation: After the interventions noted above, I reevaluated the patient and found that they have :resolved  Social Determinants of Health:  lives independently  Disposition: Discharge  Co morbidities that complicate the patient evaluation  Past Medical History:  Diagnosis Date   A-fib ([place])    Arthritis    C. difficile diarrhea    Hyperlipidemia    Hypertension    Personal history of colonic polyp- adenoma 09/22/2013   Pre-diabetes    Rectal  prolapse    Vitamin D deficiency      Medicines Meds ordered this encounter  Medications   sodium chloride 0.9 % bolus 1,000 mL   prochlorperazine (COMPAZINE) injection 5 mg   diphenhydrAMINE (BENADRYL) injection 12.5 mg  LORazepam (ATIVAN) injection 0.5 mg   metoprolol tartrate (LOPRESSOR) 25 MG tablet    Sig: Take 1 tablet (25 mg total) by mouth 2 (two) times daily as needed (HR >120).    Dispense:  5 tablet    Refill:  0    I have reviewed the patients home medicines and have made adjustments as needed  Problem List / ED Course: Problem List Items Addressed This Visit   None Visit Diagnoses     Paroxysmal atrial fibrillation ([place])    -  Primary   Relevant Medications   metoprolol tartrate (LOPRESSOR) 25 MG tablet                   Final Clinical Impression(s) / ED Diagnoses Final diagnoses:  Paroxysmal atrial fibrillation ([place])    Rx / DC Orders ED Discharge Orders          Ordered    Amb referral to AFIB Clinic        12/17/21 0700    metoprolol tartrate (LOPRESSOR) 25 MG tablet  2 times daily PRN        12/17/21 0701              Merryl Hacker, MD 12/17/21 [phone]

## 2021-12-17 NOTE — Telephone Encounter (Signed)
Pt c/o medication issue:  1. Name of Medication: metoprolol tartrate (LOPRESSOR) 25 MG tablet   2. How are you currently taking this medication (dosage and times per day)? 1 tablet twice a day  3. Are you having a reaction (difficulty breathing--STAT)? no  4. What is your medication issue? Patient states she was discharged from the hospital and had some changes on her EKG. She says she was also given 5 metoprolol and wants to discuss what medications to proceed with. She says she only has Telamasartan at home.

## 2021-12-17 NOTE — Discharge Instructions (Signed)
You were seen today for palpitations.  This is likely related to your atrial fibrillation.  You probably need to be on a beta-blocker or calcium channel blocker.  Phone call cardiology office this morning.  I also put in an ambulatory referral to atrial fibrillation clinic.

## 2021-12-17 NOTE — Telephone Encounter (Signed)
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 She was taken off of AV nodal blocking agents in the past due to Wenckebach on a monitor. I am concerned about her being on an AV nodal blocking agent on a regular basis. The metoprolol tartrate 25 mg prn given by the ED physician is ok for now.  Can we see if she can be seen by EP this week or AFib clinic this week if EP does not have availability?  Richardson Dopp, PA-C    12/17/2021 9:49 AM

## 2021-12-17 NOTE — ED Triage Notes (Addendum)
Pt BIB EMS from home for palpitations, found to be in SVT 160-200's given '6mg'$  of Adenosine converted to sinus tach. Pt was cardioverted on Friday. EMS reports pt briefly went into a-flutter at 100-116 then back into sinus tach. C/o CP in route 324 ASA given.   127/77 HR 104

## 2021-12-19 ENCOUNTER — Telehealth: Payer: Self-pay | Admitting: *Deleted

## 2021-12-19 ENCOUNTER — Telehealth: Payer: Self-pay | Admitting: Nurse Practitioner

## 2021-12-19 ENCOUNTER — Encounter: Payer: Self-pay | Admitting: Cardiology

## 2021-12-19 ENCOUNTER — Ambulatory Visit: Payer: PPO | Attending: Cardiology | Admitting: Cardiology

## 2021-12-19 VITALS — BP 102/70 | HR 79 | Ht 68.0 in | Wt 185.8 lb

## 2021-12-19 DIAGNOSIS — D6869 Other thrombophilia: Secondary | ICD-10-CM | POA: Diagnosis not present

## 2021-12-19 DIAGNOSIS — I4819 Other persistent atrial fibrillation: Secondary | ICD-10-CM

## 2021-12-19 MED ORDER — AMIODARONE HCL 200 MG PO TABS: 200.0000 mg | ORAL_TABLET | Freq: Every day | ORAL | 6 refills | Status: DC

## 2021-12-19 NOTE — Patient Instructions (Addendum)
Medication Instructions:  Your physician has recommended you make the following change in your medication:  STOP Celexa -- wean off of this medication  - take 1/2 tablet for 1 -  1 1/2 weeks, then  - take 1/4 tablet for 1 week  - then stop all together   Wait a week after stopping Celelxa, then START Amiodarone 200 mg once daily  *If you need a refill on your cardiac medications before your next appointment, please call your pharmacy*   Lab Work: None ordered   Testing/Procedures: Your physician has requested that you have cardiac CT within 7 days PRIOR to your ablation. Cardiac computed tomography (CT) is a painless test that uses an x-ray machine to take clear, detailed pictures of your heart. For further information please visit [email or web address]. Please follow instruction sheet as given.  Someone will call you to arrange this testing.  Your physician has recommended that you have an ablation. Catheter ablation is a medical procedure used to treat some cardiac arrhythmias (irregular heartbeats). During catheter ablation, a long, thin, flexible tube is put into a blood vessel in your groin (upper thigh), or neck. This tube is called an ablation catheter. It is then guided to your heart through the blood vessel. Radio frequency waves destroy small areas of heart tissue where abnormal heartbeats may cause an arrhythmia to start. Please see the instruction sheet given to you today.  You are scheduled for 05/06/2022   Follow-Up: At [facility], you and your health needs are our priority.  As part of our continuing mission to provide you with exceptional heart care, we have created designated Provider Care Teams.  These Care Teams include your primary Cardiologist (physician) and Advanced Practice Providers (APPs -  Physician Assistants and Nurse Practitioners) who all work together to provide you with the care you need, when you need it.  Your next appointment:   April  4th, 2024 @  11:20 am   (for HP and lab work, you do not need to be fasting)  The format for your next appointment:   In Person  Provider:   Legrand Como "Jonni Sanger" Chalmers Cater, [place]    Thank you for choosing [facility]!!   Trinidad Curet, RN [phone]  Other Instructions    Important Information About Sugar       Amiodarone Tablets What is this medication? AMIODARONE (a MEE oh da rone) prevents and treats a fast or irregular heartbeat (arrhythmia). It works by slowing down overactive electric signals in the heart, which stabilizes your heart rhythm. It belongs to a group of medications called antiarrhythmics. This medicine may be used for other purposes; ask your health care provider or pharmacist if you have questions. COMMON BRAND NAME(S): Cordarone, Pacerone What should I tell my care team before I take this medication? They need to know if you have any of these conditions: Liver disease Lung disease Other heart problems Thyroid disease An unusual or allergic reaction to amiodarone, iodine, other medications, foods, dyes, or preservatives Pregnant or trying to get pregnant Breast-feeding How should I use this medication? Take this medication by mouth with water. Take it as directed on the prescription label at the same time every day. You can take it with or without food. You should always take it the same way. Keep taking it unless your care team tells you to stop. A special MedGuide will be given to you by the pharmacist with each prescription and refill. Be sure to read this information carefully each time.  Talk to your care team about the use of this medication in children. Special care may be needed. Overdosage: If you think you have taken too much of this medicine contact a poison control center or emergency room at once. NOTE: This medicine is only for you. Do not share this medicine with others. What if I miss a dose? If you miss a dose, take it as soon as you can. If it is  almost time for your next dose, take only that dose. Do not take double or extra doses. What may interact with this medication? Do not take this medication with any of the following: Abarelix Apomorphine Arsenic trioxide Certain antibiotics, such as erythromycin, gemifloxacin, levofloxacin, or pentamidine Certain medications for depression, such as amoxapine or tricyclic antidepressants Certain medications for fungal infections, such as fluconazole, itraconazole, ketoconazole, posaconazole, or voriconazole Certain medications for irregular heartbeat, such as disopyramide, dronedarone, ibutilide, propafenone, or sotalol Certain medications for malaria, such as chloroquine or halofantrine Cisapride Droperidol Haloperidol [place] Maprotiline Methadone Phenothiazines, such as chlorpromazine, mesoridazine, or thioridazine Pimozide Ranolazine Red yeast rice Vardenafil This medication may also interact with the following: Antivirals for HIV Certain medications for blood pressure, heart disease, irregular heartbeat Certain medications for cholesterol, such as atorvastatin, cerivastatin, lovastatin, or simvastatin Certain medications for hepatitis C, such as sofosbuvir and ledipasvir; sofosbuvir Certain medications for seizures, such as phenytoin Certain medications for thyroid problems Certain medications that prevent or treat blood clots, such as warfarin Cholestyramine Cimetidine Clopidogrel Cyclosporine Dextromethorphan Diuretics Dofetilide Fentanyl General anesthetics Grapefruit juice Lidocaine Loratadine Methotrexate Other medications that cause heart rhythm changes Procainamide Quinidine Rifabutin, rifampin, or rifapentine St. John's Wort Trazodone Ziprasidone This list may not describe all possible interactions. Give your health care provider a list of all the medicines, herbs, non-prescription drugs, or dietary supplements you use. Also tell them if you smoke, drink  alcohol, or use illegal drugs. Some items may interact with your medicine. What should I watch for while using this medication? Your condition will be monitored closely when you first begin therapy. This medication is often started in a hospital or other monitored health care setting. Once you are on maintenance therapy, visit your care team for regular checks on your progress. Because your condition and use of this medication carry some risk, it is a good idea to carry an identification card, necklace, or bracelet with details of your condition, medications, and care team. This medication may affect your coordination, reaction time, or judgment. Do not drive or operate machinery until you know how this medication affects you. Sit up or stand slowly to reduce the risk of dizzy or fainting spells. Drinking alcohol with this medication can increase the risk of these side effects. This medication can make you more sensitive to the sun. Keep out of the sun. If you cannot avoid being in the sun, wear protective clothing and sunscreen. Do not use sun lamps, tanning beds, or tanning booths. You should have regular eye exams before and during treatment. Call your care team if you have blurred vision, see halos, or your eyes become sensitive to light. Your eyes may get dry. It may be helpful to use a lubricating eye solution or artificial tears solution. If you are going to have surgery or a procedure that requires contrast dyes, tell your care team that you are taking this medication. What side effects may I notice from receiving this medication? Side effects that you should report to your care team as soon as possible: Allergic reactions--skin  rash, itching, hives, swelling of the face, lips, tongue, or throat Bluish-gray skin Change in vision such as blurry vision, seeing halos around lights, vision loss Heart failure--shortness of breath, swelling of the ankles, feet, or hands, sudden weight gain, unusual  weakness or fatigue Heart rhythm changes--fast or irregular heartbeat, dizziness, feeling faint or lightheaded, chest pain, trouble breathing High thyroid levels (hyperthyroidism)--fast or irregular heartbeat, weight loss, excessive sweating or sensitivity to heat, tremors or shaking, anxiety, nervousness, irregular menstrual cycle or spotting Liver injury--right upper belly pain, loss of appetite, nausea, light-colored stool, dark yellow or brown urine, yellowing skin or eyes, unusual weakness or fatigue Low thyroid levels (hypothyroidism)--unusual weakness or fatigue, sensitivity to cold, constipation, hair loss, dry skin, weight gain, feelings of depression Lung injury--shortness of breath or trouble breathing, cough, spitting up blood, chest pain, fever Pain, tingling, or numbness in the hands or feet, muscle weakness, trouble walking, loss of balance or coordination Side effects that usually do not require medical attention (report to your care team if they continue or are bothersome): Nausea Vomiting This list may not describe all possible side effects. Call your doctor for medical advice about side effects. You may report side effects to FDA at 1-800-FDA-1088. Where should I keep my medication? Keep out of the reach of children and pets. Store at room temperature between 20 and 25 degrees C (68 and 77 degrees F). Protect from light. Keep container tightly closed. Throw away any unused medication after the expiration date. NOTE: This sheet is a summary. It may not cover all possible information. If you have questions about this medicine, talk to your doctor, pharmacist, or health care provider.  2023 Elsevier/Gold Standard (2020-02-24 00:00:00)     Cardiac Ablation Cardiac ablation is a procedure to destroy (ablate) heart tissue that is sending bad signals. These bad signals cause the heart to beat very fast or in a way that is not normal. Destroying some tissues can help make the heart  rhythm normal. Tell your doctor about: Any allergies you have. All medicines you are taking. These include vitamins, herbs, eye drops, creams, and over-the-counter medicines. Any problems you or family members have had with anesthesia. Any bleeding problems you have. Any surgeries you have had. Any medical conditions you have. Whether you are pregnant or may be pregnant. What are the risks? Your doctor will talk with you about risks. These may include: Infection. Bruising and bleeding. Stroke or blood clots. Damage to nearby areas of your body. Allergies to medicines or dyes. Needing a pacemaker if the heart gets damaged. A pacemaker helps the heart beat normally. The procedure not working. What happens before the procedure? Medicines Ask your doctor about changing or stopping: Your normal medicines. Vitamins, herbs, and supplements. Over-the-counter medicines. Do not take aspirin or ibuprofen unless you are told to. General instructions Follow instructions from your doctor about what you may eat and drink. If you will be going home right after the procedure, plan to have a responsible adult: Take you home from the hospital or clinic. You will not be allowed to drive. Care for you for the time you are told. Ask your doctor what steps will be taken to prevent the spread of germs. What happens during the procedure?  An IV tube will be put into one of your veins. You may be given: A sedative. This helps you relax. Anesthesia. This will: Numb certain areas of your body. The skin on your neck or groin will be numbed. A cut (  incision) will be made in your neck or groin. A needle will be put through the cut and into a large vein. The small, thin tube (catheter) will be put into the needle. The tube will be moved to your heart. A type of X-ray (fluoroscopy) will be used to help guide the tube. It will also show constant images of the heart on a screen. Dye may be put through the  tube. This helps your doctor see your heart. An electric current will be sent from the tube to destroy heart tissue in certain areas. The tube will be taken out. Pressure will be held on your cut. This helps stop bleeding. A bandage (dressing) will be put over your cut. The procedure may vary among doctors and hospitals. What happens after the procedure? You will be monitored until you leave the hospital or clinic. This includes checking your blood pressure, heart rate and rhythm, breathing rate, and blood oxygen level. Your cut will be checked for bleeding. You will need to lie still for a few hours. If your groin was used, you will need to keep your leg straight for a few hours after the small, thin tube is removed. This information is not intended to replace advice given to you by your health care provider. Make sure you discuss any questions you have with your health care provider. Document Revised: 06/18/2021 Document Reviewed: 06/18/2021 Elsevier Patient Education  [place].

## 2021-12-19 NOTE — Telephone Encounter (Signed)
Advised to stop Wellbutrin as well. She understands she will need to wean off of this as well. Advised to discuss with Wellbutrin & Celexa ordering provider on how to stop both safely. Pt will update [place] when she is able to start Amiodarone.

## 2021-12-19 NOTE — Telephone Encounter (Signed)
Cardiologist wants to do an ablation on the pt and has put her on Amiodarone. He is wanting her to wean off of Wellbutrin and Celexa, she wants to know how she should go about that.

## 2021-12-19 NOTE — Telephone Encounter (Signed)
Are your celexa and Wellbutrin both tablets.  If so I would take 1/2 tab of each every day for a week and then go to 1/2 tab every other day x 1 week and then stop.  Please let me know if you have any problems when weaning then medication

## 2021-12-19 NOTE — Progress Notes (Signed)
Electrophysiology Office Note   Date:  12/19/2021   ID:  Taylor Li, DOB 1950-02-04, MRN [id number]  PCP:  Unk Pinto, MD  Cardiologist:  Gwenlyn Found Primary Electrophysiologist:  Judd Mccubbin Meredith Leeds, MD    Chief Complaint: AF   History of Present Illness: Taylor Li is a 71 y.o. female who is being seen today for the evaluation of AF at the request of Unk Pinto, MD. Presenting today for electrophysiology evaluation.  She has a history significant for atrial fibrillation, hypertension, hyperlipidemia, thoracic aortic aneurysm.  She is status post TEE cardioversion 12/13/2021.  She developed COVID in October and had been fatigued since then.  She also has a history of Mobitz 1 AV block and thus rate control has not been started.  Her atrial fibrillation was not rapid.  She presented to the emergency room with palpitations.  She was apparently found to have atrial fibrillation.  She was given adenosine which apparently converted her to sinus rhythm.  It was thought that this was all atrial fibrillation and atrial flutter.  Today, she denies symptoms of palpitations, chest pain, shortness of breath, orthopnea, PND, lower extremity edema, claudication, dizziness, presyncope, syncope, bleeding, or neurologic sequela. The patient is tolerating medications without difficulties.    Past Medical History:  Diagnosis Date   A-fib [facility])    Arthritis    C. difficile diarrhea    Hyperlipidemia    Hypertension    Personal history of colonic polyp- adenoma 09/22/2013   Pre-diabetes    Rectal prolapse    Vitamin D deficiency    Past Surgical History:  Procedure Laterality Date   APPENDECTOMY     BREAST SURGERY Bilateral 1983   SQ mastectomies   CARDIOVERSION     CARDIOVERSION N/A 12/13/2021   Procedure: CARDIOVERSION;  Surgeon: Skeet Latch, MD;  Location: [facility];  Service: Cardiovascular;  Laterality: N/A;   CHOLECYSTECTOMY     COLONOSCOPY     LAMINECTOMY  1982    L4-L5   NASAL SEPTUM SURGERY     TEE WITHOUT CARDIOVERSION N/A 12/13/2021   Procedure: TRANSESOPHAGEAL ECHOCARDIOGRAM (TEE);  Surgeon: Skeet Latch, MD;  Location: [facility] ENDOSCOPY;  Service: Cardiovascular;  Laterality: N/A;   TONSILLECTOMY     VAGINAL HYSTERECTOMY  1985     Current Outpatient Medications  Medication Sig Dispense Refill   ALPRAZolam (XANAX) 1 MG tablet TAKE 1/2-1 TABLET 2-3 TIMES A DAY ONLY IF NEEDED FOR PANIC OR ANXIETY ATTACK & LIMIT TO 5 DAYS /WEEK TO AVOID ADDICTION & DEMENTIA (Patient taking differently: Take 0.5-1 mg by mouth at bedtime.) 60 tablet 0   amiodarone (PACERONE) 200 MG tablet Take 1 tablet (200 mg total) by mouth daily. 30 tablet 6   apixaban (ELIQUIS) 5 MG TABS tablet Take 1 tablet (5 mg total) by mouth 2 (two) times daily. 180 tablet 3   Ascorbic Acid (VITAMIN C) 1000 MG tablet Take 2,000 mg by mouth daily.     BLACK PEPPER-TURMERIC PO Take 3 tablets by mouth daily.     buPROPion (WELLBUTRIN XL) 300 MG 24 hr tablet TAKE 1 TABLET DAILY FOR MOOD, FOCUS & CONCENTRATION 90 tablet 3   Cholecalciferol (DIALYVITE VITAMIN D 5000) 125 MCG (5000 UT) capsule Take 5,000 Units by mouth daily.     citalopram (CELEXA) 40 MG tablet TAKE 1 TABLET BY MOUTH DAILY FOR MOOD 90 tablet 3   cyclobenzaprine (FLEXERIL) 10 MG tablet TAKE 1/2 TO 1 TABLET 2 TO 3 X /DAY AS NEEDED FOR MUSCLE SPASM (  Patient taking differently: Take 5-10 mg by mouth at bedtime.) 90 tablet 0   Magnesium 500 MG TABS Take 1,000 mg by mouth daily. Takes 2 tablets daily     Menthol, Topical Analgesic, (BIOFREEZE EX) Apply 1 Application topically daily as needed (pain).     metoprolol tartrate (LOPRESSOR) 25 MG tablet Take 1 tablet (25 mg total) by mouth 2 (two) times daily as needed (HR >120). 5 tablet 0   Multiple Vitamin (MULTIVITAMIN) tablet Take 1 tablet by mouth daily.     Omega-3 Fatty Acids (FISH OIL) 1000 MG CAPS Take 1,000 mg by mouth daily.     OVER THE COUNTER MEDICATION Apply 1 Application  topically daily as needed (pain). CBD roll on     OVER THE COUNTER MEDICATION Apply 1 Application topically daily as needed (pain). pomada de coca plus marijuana cream     Polyvinyl Alcohol-Povidone (REFRESH OP) Place 1 drop into both eyes daily as needed (dry eyes).     saccharomyces boulardii (FLORASTOR) 250 MG capsule Take 500 mg by mouth daily.     Skin Oils (CASTOR OIL EX) Apply 1 Application topically daily as needed (pain).     telmisartan (MICARDIS) 20 MG tablet TAKE 1 TABLET DAILY FOR BLOOD PRESSURE 90 tablet 3   No current facility-administered medications for this visit.    Allergies:   Rosuvastatin and Other   Social History:  The patient  reports that she quit smoking about 9 years ago. Her smoking use included cigarettes and e-cigarettes. She has never used smokeless tobacco. She reports current alcohol use. She reports that she does not use drugs.   Family History:  The patient's family history includes Atrial fibrillation in her mother; Barrett's esophagus in her mother; Clotting disorder in her father; Hypertension in her father and mother; Lung cancer in her father; Mitral valve prolapse in her mother; Thyroid disease in her father.    ROS:  Please see the history of present illness.   Otherwise, review of systems is positive for none.   All other systems are reviewed and negative.    PHYSICAL EXAM: VS:  BP 102/70   Pulse 79   Ht '5\' 8"'$  (1.727 m)   Wt 185 lb 12.8 oz (84.3 kg)   SpO2 98%   BMI 28.25 kg/m  , BMI Body mass index is 28.25 kg/m. GEN: Well nourished, well developed, in no acute distress  HEENT: normal  Neck: no JVD, carotid bruits, or masses Cardiac: RRR; no murmurs, rubs, or gallops,no edema  Respiratory:  clear to auscultation bilaterally, normal work of breathing GI: soft, nontender, nondistended, + BS MS: no deformity or atrophy  Skin: warm and dry Neuro:  Strength and sensation are intact Psych: euthymic mood, full affect  EKG:  EKG is not  ordered today. Personal review of the ekg ordered 12/17/2021 shows sinus rhythm, first-degree AV block  Recent Labs: 12/09/2021: ALT 16; TSH 1.53 12/17/2021: BUN 19; Creatinine, Ser 0.84; Hemoglobin 14.6; Magnesium 1.8; Platelets 280; Potassium 4.2; Sodium 135    Lipid Panel     Component Value Date/Time   CHOL 200 (H) 12/09/2021 1704   CHOL 171 09/04/2017 0944   TRIG 90 12/09/2021 1704   HDL 87 12/09/2021 1704   HDL 78 09/04/2017 0944   CHOLHDL 2.3 12/09/2021 1704   VLDL 23 05/15/2016 1553   LDLCALC 95 12/09/2021 1704     Wt Readings from Last 3 Encounters:  12/19/21 185 lb 12.8 oz (84.3 kg)  12/17/21 187 lb (  84.8 kg)  12/13/21 187 lb (84.8 kg)      Other studies Reviewed: Additional studies/ records that were reviewed today include: TEE 12/13/21  Review of the above records today demonstrates:   1. Left ventricular ejection fraction, by estimation, is 60 to 65%. The  left ventricle has normal function. The left ventricle has no regional  wall motion abnormalities.   2. Right ventricular systolic function is normal. The right ventricular  size is normal.   3. No left atrial/left atrial appendage thrombus was detected. The LAA  emptying velocity was 51 cm/s.   4. The mitral valve is normal in structure. Trivial mitral valve  regurgitation. No evidence of mitral stenosis.   5. The aortic valve is tricuspid. Aortic valve regurgitation is not  visualized. No aortic stenosis is present.   6. Aortic dilatation noted. There is mild dilatation of the aortic root,  measuring 36 mm. There is mild dilatation of the ascending aorta,  measuring 40 mm.   7. The inferior vena cava is normal in size with greater than 50%  respiratory variability, suggesting right atrial pressure of 3 mmHg.    ASSESSMENT AND PLAN:  1.  Persistent atrial fibrillation: Currently on Eliquis 5 mg twice daily.  CHA2DS2-VASc of 4.  Post TEE and cardioversion 12/13/2021.  She is continue to have episodes of  atrial fibrillation.  She feels poorly in atrial fibrillation.  Due to that, we Taylor Li plan for rhythm control strategy.  She would prefer to avoid long-term medications.  Taylor Li plan for ablation.  Prior to ablation, Taylor Li start amiodarone for rhythm control.  She is currently on antidepressants.  Taylor Li discuss with pharmacy if an alternative antidepressant would be reasonable so that amiodarone can be started.  Risk, benefits, and alternatives to EP study and radiofrequency ablation for afib were also discussed in detail today. These risks include but are not limited to stroke, bleeding, vascular damage, tamponade, perforation, damage to the esophagus, lungs, and other structures, pulmonary vein stenosis, worsening renal function, and death. The patient understands these risk and wishes to proceed.  We Taylor Li therefore proceed with catheter ablation at the next available time.  Carto, ICE, anesthesia are requested for the procedure.  Taylor Li also obtain CT PV protocol prior to the procedure to exclude LAA thrombus and further evaluate atrial anatomy.   2.  Secondary hypercoagulable state: Currently on Eliquis for atrial fibrillation as above  3.  Hypertension: Currently well-controlled  4.  Hyperlipidemia: Continue Zetia per primary cardiology  5.  Thoracic aortic aneurysm: Serial CT scans arranged by primary care    Current medicines are reviewed at length with the patient today.   The patient does not have concerns regarding her medicines.  The following changes were made today:  none  Labs/ tests ordered today include: No orders of the defined types were placed in this encounter.    Disposition:   FU with Taylor Li 3 months  Signed, Taylor Steward Meredith Leeds, MD  12/19/2021 10:41 AM     [facility] [address] [place] [address] [phone] (office) [phone] (fax)

## 2021-12-19 NOTE — Telephone Encounter (Signed)
Pt is wanting to know if Zetia will have an interaction with Amiodarone

## 2021-12-20 ENCOUNTER — Ambulatory Visit (HOSPITAL_COMMUNITY): Payer: PPO | Admitting: Physician Assistant

## 2021-12-20 NOTE — Telephone Encounter (Signed)
No I can not find any interactions between those medications

## 2021-12-24 ENCOUNTER — Ambulatory Visit (HOSPITAL_COMMUNITY): Payer: PPO | Admitting: Physician Assistant

## 2021-12-24 ENCOUNTER — Encounter: Payer: Self-pay | Admitting: Internal Medicine

## 2021-12-25 ENCOUNTER — Encounter: Payer: Self-pay | Admitting: Cardiology

## 2021-12-25 ENCOUNTER — Telehealth: Payer: Self-pay

## 2021-12-25 ENCOUNTER — Ambulatory Visit (HOSPITAL_COMMUNITY): Payer: PPO | Admitting: Physician Assistant

## 2021-12-25 NOTE — Telephone Encounter (Signed)
        Patient  visited [facility] on 12/5     Telephone encounter attempt :  1st  A HIPAA compliant voice message was left requesting a return call.  Instructed patient to call back.    [facility], Care Management  [phone] [address], [place], [place] [address] Phone: [phone] Email: [email or web address]

## 2021-12-26 ENCOUNTER — Encounter: Payer: Self-pay | Admitting: Internal Medicine

## 2021-12-26 ENCOUNTER — Telehealth: Payer: Self-pay

## 2021-12-26 NOTE — Telephone Encounter (Signed)
     Patient  visit on 12/5  at [facility]    Have you been able to follow up with your primary care physician? Yes   The patient was or was not able to obtain any needed medicine or equipment. Yes   Are there diet recommendations that you are having difficulty following? NA  Patient expresses understanding of discharge instructions and education provided has no other needs at this time. Yes     [place], [facility], Care Management  [phone] [address], [place], [place] [address] Phone: [phone] Email: [email or web address]

## 2021-12-27 ENCOUNTER — Other Ambulatory Visit: Payer: Self-pay | Admitting: Nurse Practitioner

## 2021-12-27 DIAGNOSIS — F411 Generalized anxiety disorder: Secondary | ICD-10-CM

## 2022-01-01 ENCOUNTER — Telehealth: Payer: Self-pay | Admitting: Cardiovascular Disease

## 2022-01-01 ENCOUNTER — Ambulatory Visit (HOSPITAL_COMMUNITY): Payer: PPO | Attending: Physician Assistant

## 2022-01-01 DIAGNOSIS — I480 Paroxysmal atrial fibrillation: Secondary | ICD-10-CM | POA: Insufficient documentation

## 2022-01-01 LAB — ECHOCARDIOGRAM COMPLETE
Area-P 1/2: 3.26 cm2
S' Lateral: 3.05 cm

## 2022-01-01 NOTE — Telephone Encounter (Signed)
   Patient Name: Taylor Li  DOB: 03/28/1950 MRN: [id number]  Primary Cardiologist: Quay Burow, MD  Chart reviewed as part of pre-operative protocol coverage.   Simple dental extractions (i.e. 1-2 teeth) and cleanings are considered low risk procedures per guidelines and generally do not require any specific cardiac clearance. It is also generally accepted that for simple extractions and dental cleanings, there is no need to interrupt blood thinner therapy.   SBE prophylaxis is not required for the patient from a cardiac standpoint.  I will route this recommendation to the requesting party via Epic fax function and remove from pre-op pool.  Please call with questions.  Darreld Mclean, PA-C 01/01/2022, 12:14 PM

## 2022-01-01 NOTE — Telephone Encounter (Signed)
   Pre-operative Risk Assessment    Patient Name: Taylor Li  DOB: 09-02-1950 MRN: [id number]      Request for Surgical Clearance    Procedure:   Routine Cleaning  Date of Surgery:  Clearance 01/02/22                                 Surgeon:  Dr. Lavonne Chick Surgeon's Group or Practice Name:  Dr. Lavonne Chick Phone number:  [phone] Fax number:  [phone]   Type of Clearance Requested:   - Pharmacy:  Hold Apixaban (Eliquis) Is antibiotic needed   Type of Anesthesia:  None    Additional requests/questions:  Please advise surgeon/provider what medications should be held.  Signed, Belisicia T Kenton Kingfisher   01/01/2022, 11:59 AM

## 2022-01-02 ENCOUNTER — Telehealth: Payer: Self-pay | Admitting: Cardiovascular Disease

## 2022-01-02 NOTE — Telephone Encounter (Signed)
Pt c/o medication issue:  1. Name of Medication: amiodarone (PACERONE) 200 MG tablet   2. How are you currently taking this medication (dosage and times per day)?   Take 1 tablet (200 mg total) by mouth daily.    3. Are you having a reaction (difficulty breathing--STAT)? no  4. What is your medication issue? Patient was calling with question/concerns with the medication.

## 2022-01-02 NOTE — Telephone Encounter (Signed)
Will finish tapering the antidepressants on Sunday Pt will let us know when starting the Amiodarone in several weeks as advised.

## 2022-01-02 NOTE — Telephone Encounter (Signed)
Patient stated she wanted to speak with nurse Trinidad Curet.

## 2022-01-09 ENCOUNTER — Ambulatory Visit: Payer: PPO | Admitting: Nurse Practitioner

## 2022-01-15 ENCOUNTER — Ambulatory Visit: Payer: PPO | Admitting: Nurse Practitioner

## 2022-01-16 ENCOUNTER — Ambulatory Visit: Payer: PPO | Admitting: Nurse Practitioner

## 2022-01-20 ENCOUNTER — Telehealth: Payer: Self-pay | Admitting: Cardiology

## 2022-01-20 NOTE — Telephone Encounter (Signed)
Thanked pt for updating [place] when Amiodarone was started. Aware this is temporary for afib ablation. Ablation date for 4/23 held. She will keep follow up in early April to go over instructions. Patient verbalized understanding and agreeable to plan.

## 2022-01-20 NOTE — Telephone Encounter (Signed)
Pt c/o medication issue:  1. Name of Medication:   amiodarone (PACERONE) 200 MG tablet    2. How are you currently taking this medication (dosage and times per day)?   Take 1 tablet (200 mg total) by mouth daily.    3. Are you having a reaction (difficulty breathing--STAT)? No  4. What is your medication issue? Pt states that she was told to call and notify nurse upon starting medication. Please advise

## 2022-01-24 ENCOUNTER — Ambulatory Visit: Payer: PPO | Admitting: Adult Health

## 2022-01-29 ENCOUNTER — Other Ambulatory Visit: Payer: Self-pay | Admitting: Internal Medicine

## 2022-01-29 ENCOUNTER — Other Ambulatory Visit: Payer: Self-pay | Admitting: Nurse Practitioner

## 2022-01-29 DIAGNOSIS — F411 Generalized anxiety disorder: Secondary | ICD-10-CM

## 2022-02-03 ENCOUNTER — Ambulatory Visit (INDEPENDENT_AMBULATORY_CARE_PROVIDER_SITE_OTHER): Payer: PPO | Admitting: Internal Medicine

## 2022-02-03 ENCOUNTER — Encounter: Payer: Self-pay | Admitting: Internal Medicine

## 2022-02-03 VITALS — BP 136/86 | HR 74 | Temp 97.9°F | Resp 16 | Ht 68.0 in | Wt 189.4 lb

## 2022-02-03 DIAGNOSIS — R7309 Other abnormal glucose: Secondary | ICD-10-CM | POA: Diagnosis not present

## 2022-02-03 DIAGNOSIS — I480 Paroxysmal atrial fibrillation: Secondary | ICD-10-CM

## 2022-02-03 DIAGNOSIS — E782 Mixed hyperlipidemia: Secondary | ICD-10-CM | POA: Diagnosis not present

## 2022-02-03 DIAGNOSIS — E559 Vitamin D deficiency, unspecified: Secondary | ICD-10-CM | POA: Diagnosis not present

## 2022-02-03 DIAGNOSIS — I10 Essential (primary) hypertension: Secondary | ICD-10-CM | POA: Diagnosis not present

## 2022-02-03 DIAGNOSIS — Z79899 Other long term (current) drug therapy: Secondary | ICD-10-CM | POA: Diagnosis not present

## 2022-02-03 NOTE — Patient Instructions (Signed)

## 2022-02-03 NOTE — Progress Notes (Signed)
Future Appointments  Date Time Provider Department  02/03/2022             6 mon ov  2:30 PM Unk Pinto, MD [facility]  03/11/2022  2:00 PM [name], MD [facility]  03/12/2022             wellness  2:30 PM Darrol Jump, NP [facility]  04/17/2022 11:20 AM Shirley Friar, PA-C [facility]  08/04/2022                cpe  2:00 PM Unk Pinto, MD [facility]    History of Present Illness:       This very nice 72 y.o. MWF with HTN, HLD, Pre-Diabetes and Vitamin D Deficiency presents for 6 month follow up .  Patient has multiple concerns today mostly related to bilateral shoulder pains exacerbated with shoulder abductions.         Patient is treated for HTN  since  2008 & BP has been controlled at home. Today's BP was initially slightly elevated , but rechecked 136/86.  Patient has remote hx /o CV x 2 for pAfib in 2002 & 2004. Patient  was discovered in Nov 2023 with Afib & has been scheduled by Dr Curt Bears for an Ablation in April. She had a negative Stress Myoview by Dr Gwenlyn Found in 2009.  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 Patient has had no complaints of any cardiac type chest pain, palpitations, dyspnea /Orthopnea /PND, dizziness, claudication, or dependent edema.        Hyperlipidemia is controlled with diet & meds. Patient denies myalgias or other med SE's. Last Lipids were at goal :  Lab Results  Component Value Date   CHOL 200 (H) 12/09/2021   HDL 87 12/09/2021   LDLCALC 95 12/09/2021   TRIG 90 12/09/2021   CHOLHDL 2.3 12/09/2021      Also, the patient has history of PreDiabetes (A1c 5.8% /2011 &  5.9% /2011) and has had no symptoms of reactive hypoglycemia, diabetic polys, paresthesias or visual blurring.  Last A1c was Normal & at goal :  Lab Results  Component Value Date   HGBA1C 5.4 07/30/2021                                                          Further, the patient also has history of  Vitamin D Deficiency  ("36 " /2010)  and supplements vitamin D . Last vitamin D was at goal :  Lab Results  Component Value Date   VD25OH 72 07/30/2021     Current Outpatient Medications  Medication Instructions   ALPRAZolam (XANAX) 0.5-1 mg, Oral, Daily at bedtime   amiodarone (PACERONE) 200 mg, Oral, Daily   apixaban (ELIQUIS) 5 mg, Oral, 2 times daily   cyclobenzaprine (FLEXERIL) 5-10 mg, Oral, Daily at bedtime   Vitamin D 5000 5,000 Units, Oral, Daily   ezetimibe (ZETIA) 10 mg, Oral, Daily   Magnesium 1,000 mg, Oral, Daily, Takes 2 tablets daily   telmisartan (MICARDIS) 20 MG tablet TAKE 1 TABLET DAILY    vitamin C 2,000 mg, Oral, Daily      Allergies  Allergen Reactions   Rosuvastatin     myalgias   Other Itching and Rash    Fresh green  peppers     PMHx:   Past Medical History:  Diagnosis Date   A-fib ([place])    Arthritis    C. difficile diarrhea    Hyperlipidemia    Hypertension    Personal history of colonic polyp- adenoma 09/22/2013   Pre-diabetes    Rectal prolapse    Vitamin D deficiency      Immunization History  Administered Date(s) Administered   DTaP 01/13/2001   Influenza Inj Mdck Quad 09/30/2016   Influenza  10/06/2012   Influenza, High Dose  10/15/2017, 10/24/2019, 12/09/2021   Influenza, Quadrivalent 10/15/2018   Influenza,inj,Quad  10/27/2020   Influenza,inj,quad 10/18/2015   Influenza-Unspecified 11/11/2013, 11/15/2014   PFIZER Covid-19 Tri-Sucrose Vacc 05/15/2020   PFIZER -SARS-COV-2 Vacc 02/25/2019, 03/22/2019, 10/24/2019   PPD Test 01/14/2013, 04/17/2014, 04/16/2015, 05/15/2016   Pfizer Covid-19 Vaccine Bivalent  10/27/2020   Pneumococcal -13 06/15/2017   Pneumococcal -23 01/13/2001, 02/01/2019   Tdap 10/18/2015   Zoster Recombinat (Shingrix) 03/29/2021, 06/14/2021   Zoster, Live 11/19/2009     Past Surgical History:  Procedure Laterality Date   APPENDECTOMY     BREAST SURGERY Bilateral 1983   SQ mastectomies    CARDIOVERSION     CARDIOVERSION N/A 12/13/2021   Procedure: CARDIOVERSION;  Surgeon: Skeet Latch, MD;  Location: [place];  Service: Cardiovascular;  Laterality: N/A;   CHOLECYSTECTOMY     COLONOSCOPY     LAMINECTOMY  1982   L4-L5   NASAL SEPTUM SURGERY     TEE WITHOUT CARDIOVERSION N/A 12/13/2021   Procedure: TRANSESOPHAGEAL ECHOCARDIOGRAM (TEE);  Surgeon: Skeet Latch, MD;  Location: [place];  Service: Cardiovascular;  Laterality: N/A;   TONSILLECTOMY     VAGINAL HYSTERECTOMY  1985     FHx:    Reviewed / unchanged   SHx:    Reviewed / unchanged    Systems Review:  Constitutional: Denies fever, chills, wt changes, headaches, insomnia, fatigue, night sweats, change in appetite. Eyes: Denies redness, blurred vision, diplopia, discharge, itchy, watery eyes.  ENT: Denies discharge, congestion, post nasal drip, epistaxis, sore throat, earache, hearing loss, dental pain, tinnitus, vertigo, sinus pain, snoring.  CV: Denies chest pain, palpitations, irregular heartbeat, syncope, dyspnea, diaphoresis, orthopnea, PND, claudication or edema. Respiratory: denies cough, dyspnea, DOE, pleurisy, hoarseness, laryngitis, wheezing.  Gastrointestinal: Denies dysphagia, odynophagia, heartburn, reflux, water brash, abdominal pain or cramps, nausea, vomiting, bloating, diarrhea, constipation, hematemesis, melena, hematochezia  or hemorrhoids. Genitourinary: Denies dysuria, frequency, urgency, nocturia, hesitancy, discharge, hematuria or flank pain. Musculoskeletal: Denies arthralgias, myalgias, stiffness, jt. swelling, pain, limping or strain/sprain.  Skin: Denies pruritus, rash, hives, warts, acne, eczema or change in skin lesion(s). Neuro: No weakness, tremor, incoordination, spasms, paresthesia or pain. Psychiatric: Denies confusion, memory loss or sensory loss. Endo: Denies change in weight, skin or hair change.  Heme/Lymph: No excessive bleeding, bruising or enlarged lymph  nodes.   Physical Exam  BP 136/86   Pulse 74   Temp 97.9 F (36.6 C)   Resp 16   Ht '5\' 8"'$  (1.727 m)   Wt 189 lb 6.4 oz (85.9 kg)   SpO2 96%   BMI 28.80 kg/m   Appears  well nourished, well groomed  and in no distress.  Eyes: PERRLA, EOMs, conjunctiva no swelling or erythema. Sinuses: No frontal/maxillary tenderness ENT/Mouth: EAC's clear, TM's nl w/o erythema, bulging. Nares clear w/o erythema, swelling, exudates. Oropharynx clear without erythema or exudates. Oral hygiene is good. Tongue normal, non obstructing. Hearing intact.  Neck: Supple. Thyroid not palpable. Car 2+/2+ without bruits,  nodes or JVD. Chest: Respirations nl with BS clear & equal w/o rales, rhonchi, wheezing or stridor.  Cor: Heart sounds soft w/ sl irregular rate and rhythm without sig. murmurs, gallops, clicks or rubs. Peripheral pulses normal and equal  without edema.  Abdomen: Soft & bowel sounds normal. Non-tender w/o guarding, rebound, hernias, masses or organomegaly.  Lymphatics: Unremarkable.  Musculoskeletal: Full ROM all peripheral extremities, joint stability, 5/5 strength and normal gait.  Skin: Warm, dry without exposed rashes, lesions or ecchymosis apparent.  Neuro: Cranial nerves intact, reflexes equal bilaterally. Sensory-motor testing grossly intact. Tendon reflexes grossly intact.  Pysch: Alert & oriented x 3.  Insight and judgement nl & appropriate. No ideations.   Assessment and Plan:  1. Essential hypertension  - Continue medication, monitor blood pressure at home.  - Continue DASH diet.  Reminder to go to the ER if any CP,  SOB, nausea, dizziness, severe HA, changes vision/speech.    - CBC with Differential/Platelet - COMPLETE METABOLIC PANEL WITH GFR - Magnesium - TSH   2. Abnormal glucose  - Continue  - Continue diet, exercise  - Lifestyle modifications.   - Hemoglobin A1c - Insulin, random   3. Hyperlipidemia, mixed  - Monitor appropriate labs  diet/meds,  exercise,& lifestyle modifications.  - Continue monitor periodic cholesterol/liver & renal functions   - Lipid panel - TSH   4. Vitamin D deficiency  - Continue supplementation   - VITAMIN D 25 Hydroxy   5. Paroxysmal atrial fibrillation (HCC)  - TSH   6. Medication management  - CBC with Differential/Platelet - COMPLETE METABOLIC PANEL WITH GFR - Magnesium - Lipid panel - TSH - Hemoglobin A1c - Insulin, random - VITAMIN D 25 Hydroxy          Discussed  regular exercise, BP monitoring, weight control to achieve/maintain BMI less than 25 and discussed med and SE's. Recommended labs to assess /monitor clinical status .  I discussed the assessment and treatment plan with the patient. The patient was provided an opportunity to ask questions and all were answered. The patient agreed with the plan and demonstrated an understanding of the instructions.  I provided over 30 minutes of exam, counseling, chart review and  complex critical decision making.        The patient was advised to call back or seek an in-person evaluation if the symptoms worsen or if the condition fails to improve as anticipated.   Kirtland Bouchard, MD

## 2022-02-04 LAB — CBC WITH DIFFERENTIAL/PLATELET
Absolute Monocytes: 562 cells/uL (ref 200–950)
Basophils Absolute: 23 cells/uL (ref 0–200)
Basophils Relative: 0.3 %
Eosinophils Absolute: 84 cells/uL (ref 15–500)
Eosinophils Relative: 1.1 %
HCT: 42.3 % (ref 35.0–45.0)
Hemoglobin: 14.4 g/dL (ref 11.7–15.5)
Lymphs Abs: 2158 cells/uL (ref 850–3900)
MCH: 30.4 pg (ref 27.0–33.0)
MCHC: 34 g/dL (ref 32.0–36.0)
MCV: 89.4 fL (ref 80.0–100.0)
MPV: 10.7 fL (ref 7.5–12.5)
Monocytes Relative: 7.4 %
Neutro Abs: 4773 cells/uL (ref 1500–7800)
Neutrophils Relative %: 62.8 %
Platelets: 278 10*3/uL (ref 140–400)
RBC: 4.73 10*6/uL (ref 3.80–5.10)
RDW: 12.1 % (ref 11.0–15.0)
Total Lymphocyte: 28.4 %
WBC: 7.6 10*3/uL (ref 3.8–10.8)

## 2022-02-04 LAB — HEMOGLOBIN A1C
Hgb A1c MFr Bld: 6 % of total Hgb — ABNORMAL HIGH (ref <5.7)
Mean Plasma Glucose: 126 mg/dL
eAG (mmol/L): 7 mmol/L

## 2022-02-04 LAB — COMPLETE METABOLIC PANEL WITH GFR
AG Ratio: 1.6 (calc) (ref 1.0–2.5)
ALT: 31 U/L — ABNORMAL HIGH (ref 6–29)
AST: 21 U/L (ref 10–35)
Albumin: 4.4 g/dL (ref 3.6–5.1)
Alkaline phosphatase (APISO): 66 U/L (ref 37–153)
BUN: 18 mg/dL (ref 7–25)
CO2: 27 mmol/L (ref 20–32)
Calcium: 9.7 mg/dL (ref 8.6–10.4)
Chloride: 103 mmol/L (ref 98–110)
Creat: 0.65 mg/dL (ref 0.50–1.05)
Globulin: 2.8 g/dL (calc) (ref 1.9–3.7)
Glucose, Bld: 85 mg/dL (ref 65–99)
Potassium: 3.9 mmol/L (ref 3.5–5.3)
Sodium: 139 mmol/L (ref 135–146)
Total Bilirubin: 0.5 mg/dL (ref 0.2–1.2)
Total Protein: 7.2 g/dL (ref 6.1–8.1)
eGFR: 95 mL/min/{1.73_m2} (ref >=60)

## 2022-02-04 LAB — INSULIN, RANDOM: Insulin: 5.4 u[IU]/mL

## 2022-02-04 LAB — MAGNESIUM: Magnesium: 2.3 mg/dL (ref 1.5–2.5)

## 2022-02-04 LAB — LIPID PANEL
Cholesterol: 210 mg/dL — ABNORMAL HIGH (ref <200)
HDL: 102 mg/dL (ref >=50)
LDL Cholesterol (Calc): 90 mg/dL (calc)
Non-HDL Cholesterol (Calc): 108 mg/dL (calc) (ref <130)
Total CHOL/HDL Ratio: 2.1 (calc) (ref <5.0)
Triglycerides: 90 mg/dL (ref <150)

## 2022-02-04 LAB — TSH: TSH: 2.48 mIU/L (ref 0.40–4.50)

## 2022-02-04 LAB — VITAMIN D 25 HYDROXY (VIT D DEFICIENCY, FRACTURES): Vit D, 25-Hydroxy: 44 ng/mL (ref 30–100)

## 2022-02-04 NOTE — Progress Notes (Signed)
<><><><><><><><><><><><><><><><><><><><><><><><><><><><><><><><><> <><><><><><><><><><><><><><><><><><><><><><><><><><><><><><><><><> -   Test results slightly outside the reference range are not unusual. If there is anything important, I will review this with you,  otherwise it is considered normal test values.  If you have further questions,  please do not hesitate to contact me at the office or via My Chart.  <><><><><><><><><><><><><><><><><><><><><><><><><><><><><><><><><> <><><><><><><><><><><><><><><><><><><><><><><><><><><><><><><><><>  -  Chol = 210      is OK since have such a high level of "good"  HDL <><><><><><><><><><><><><><><><><><><><><><><><><><><><><><><><><>  - A1c = 6.0% - slightly elevated blood sugar  ( Ideal or goal is less than 5.7% ) <><><><><><><><><><><><><><><><><><><><><><><><><><><><><><><><><>  - Vitamin D = 44  is Low    - Vitamin D goal is between 70-100.   - Please INCREASE  your Vitamin D 5,000 u to 2 capsules = 10,000 units  /day !  - It is very important as a natural anti-inflammatory and helping the  immune system protect against viral infections, like the Covid-19    helping hair, skin, and nails, as well as reducing stroke and  heart attack risk.   - It helps your bones and helps with mood.  - It also decreases numerous cancer risks so please  take it as directed.   - Low Vit D is associated with a 200-300% higher risk for  CANCER   and 200-300% higher risk for HEART   ATTACK  &  STROKE.    - It is also associated with higher death rate at younger ages,   autoimmune diseases like Rheumatoid arthritis, Lupus,  Multiple Sclerosis.     - Also many other serious conditions, like depression, Alzheimer's  Dementia, infertility, muscle aches, fatigue, fibromyalgia  <><><><><><><><><><><><><><><><><><><><><><><><><><><><><><><><><>  - All Else - CBC - Kidneys - Electrolytes - Liver - Magnesium & Thyroid    - all  Normal /  OK ===========================================================

## 2022-02-06 ENCOUNTER — Encounter: Payer: Self-pay | Admitting: Cardiology

## 2022-02-06 NOTE — Telephone Encounter (Signed)
Called pt in regards to my chart message; left arm/ shoulder pain that radiates to neck. Pt reports has seen orthopedic d/t inability to raise arms.  Had a cortisone injection reports the more uses left arm the more pain has.  Per pt pain is not associated with heart attack symptoms.  Reports pain improves with ointment, massage and tylenol.  Pt reports had a DCCV, since recent cortisone injection has went back into afib.  Wants to know if injection could have sent back into AF.   Wanted to ask Sherri, RN this question.  Advised Sherri and Dr. Curt Bears are not in the office today but I will send to them to address.

## 2022-02-11 ENCOUNTER — Encounter: Payer: Self-pay | Admitting: Cardiology

## 2022-02-14 NOTE — Telephone Encounter (Signed)
Pt advised sending to MD for medication change advisement. Cyanosis began after starting the Amiodarone. Informed that it may be an unwanted SE. Aware will further discuss w/ MD and let her know advisement next week. Patient verbalized understanding and agreeable to plan.

## 2022-02-18 ENCOUNTER — Encounter: Payer: Self-pay | Admitting: Cardiology

## 2022-02-19 NOTE — Telephone Encounter (Signed)
Pt informed that she can stop or continue per Dr. Curt Bears. Pt would like to continue for now but will let us know if she changes her mind and/or it starts spreading beyond her fingertips. She so appreciates my follow up call/discussion.

## 2022-02-25 ENCOUNTER — Other Ambulatory Visit: Payer: Self-pay | Admitting: Adult Health

## 2022-02-25 DIAGNOSIS — E2839 Other primary ovarian failure: Secondary | ICD-10-CM

## 2022-02-27 ENCOUNTER — Ambulatory Visit: Payer: PPO | Admitting: Adult Health

## 2022-02-27 ENCOUNTER — Other Ambulatory Visit: Payer: PPO

## 2022-02-27 ENCOUNTER — Ambulatory Visit
Admission: RE | Admit: 2022-02-27 | Discharge: 2022-02-27 | Disposition: A | Discharge status: Home / Self Care | Payer: PPO | Source: Ambulatory Visit | Attending: Adult Health | Admitting: Adult Health

## 2022-02-27 DIAGNOSIS — E2839 Other primary ovarian failure: Secondary | ICD-10-CM

## 2022-02-27 DIAGNOSIS — Z780 Asymptomatic menopausal state: Secondary | ICD-10-CM | POA: Diagnosis not present

## 2022-02-27 DIAGNOSIS — M85831 Other specified disorders of bone density and structure, right forearm: Secondary | ICD-10-CM | POA: Diagnosis not present

## 2022-03-11 ENCOUNTER — Ambulatory Visit: Payer: PPO | Admitting: Cardiovascular Disease

## 2022-03-12 ENCOUNTER — Ambulatory Visit: Payer: PPO | Admitting: Nurse Practitioner

## 2022-03-19 ENCOUNTER — Other Ambulatory Visit: Payer: Self-pay | Admitting: Nurse Practitioner

## 2022-03-19 ENCOUNTER — Other Ambulatory Visit: Payer: Self-pay | Admitting: Internal Medicine

## 2022-03-19 DIAGNOSIS — I10 Essential (primary) hypertension: Secondary | ICD-10-CM

## 2022-03-19 DIAGNOSIS — F411 Generalized anxiety disorder: Secondary | ICD-10-CM

## 2022-03-20 ENCOUNTER — Encounter: Payer: Self-pay | Admitting: Nurse Practitioner

## 2022-03-20 ENCOUNTER — Encounter: Payer: Self-pay | Admitting: Cardiology

## 2022-03-20 ENCOUNTER — Ambulatory Visit (INDEPENDENT_AMBULATORY_CARE_PROVIDER_SITE_OTHER): Payer: PPO | Admitting: Nurse Practitioner

## 2022-03-20 VITALS — BP 110/78 | HR 75 | Temp 97.5°F | Ht 68.0 in | Wt 193.0 lb

## 2022-03-20 DIAGNOSIS — M158 Other polyosteoarthritis: Secondary | ICD-10-CM | POA: Diagnosis not present

## 2022-03-20 DIAGNOSIS — R7309 Other abnormal glucose: Secondary | ICD-10-CM

## 2022-03-20 DIAGNOSIS — Z860100 Personal history of colon polyps, unspecified: Secondary | ICD-10-CM

## 2022-03-20 DIAGNOSIS — Z79899 Other long term (current) drug therapy: Secondary | ICD-10-CM | POA: Diagnosis not present

## 2022-03-20 DIAGNOSIS — I480 Paroxysmal atrial fibrillation: Secondary | ICD-10-CM

## 2022-03-20 DIAGNOSIS — I493 Ventricular premature depolarization: Secondary | ICD-10-CM | POA: Diagnosis not present

## 2022-03-20 DIAGNOSIS — I10 Essential (primary) hypertension: Secondary | ICD-10-CM

## 2022-03-20 DIAGNOSIS — E663 Overweight: Secondary | ICD-10-CM

## 2022-03-20 DIAGNOSIS — R6889 Other general symptoms and signs: Secondary | ICD-10-CM

## 2022-03-20 DIAGNOSIS — E559 Vitamin D deficiency, unspecified: Secondary | ICD-10-CM

## 2022-03-20 DIAGNOSIS — Z0001 Encounter for general adult medical examination with abnormal findings: Secondary | ICD-10-CM

## 2022-03-20 DIAGNOSIS — E782 Mixed hyperlipidemia: Secondary | ICD-10-CM

## 2022-03-20 DIAGNOSIS — Z0000 Encounter for general adult medical examination without abnormal findings: Secondary | ICD-10-CM

## 2022-03-20 DIAGNOSIS — J449 Chronic obstructive pulmonary disease, unspecified: Secondary | ICD-10-CM

## 2022-03-20 DIAGNOSIS — F32A Depression, unspecified: Secondary | ICD-10-CM

## 2022-03-20 DIAGNOSIS — I712 Thoracic aortic aneurysm, without rupture, unspecified: Secondary | ICD-10-CM

## 2022-03-20 DIAGNOSIS — F419 Anxiety disorder, unspecified: Secondary | ICD-10-CM | POA: Diagnosis not present

## 2022-03-20 NOTE — Progress Notes (Signed)
MEDICARE ANNUAL WELLNESS VISIT AND FOLLOW UP  Assessment:   Diagnoses and all orders for this visit:  Annual Medicare Wellness Visit Due annually  Health maintenance reviewed  Thoracic aortic aneurysm without rupture ([place]) Followed by Dr. Roxan Hockey Monitor annually  PVC's (premature ventricular contractions) Monitor, followed by Dr. Gwenlyn Found  Paroxysmal atrial fibrillation [facility]) Continue medications Continue with cardiology for further evaluation Go to the ER if any chest pain, shortness of breath, nausea, dizziness, severe HA, changes vision/speech   Essential hypertension Discussed DASH (Dietary Approaches to Stop Hypertension) DASH diet is lower in sodium than a typical American diet. Cut back on foods that are high in saturated fat, cholesterol, and trans fats. Eat more whole-grain foods, fish, poultry, and nuts Remain active and exercise as tolerated daily.  Monitor BP at home-Call if greater than 130/80.  Check CMP/CBC   Chronic obstructive pulmonary disease, unspecified COPD type ([place]) Asymptomatic, former smokr Continue to monitor  Vitamin D deficiency Continue supplementation Check vitamin D level annually and as needed  Personal history of colonic polyp- adenoma Follow up colonoscopy was normal 10/2020, none further recommended by Dr. Carlean Purl Discuss options in 2032 if in good health   Overweight (BMI 25.0-29.9) Discussed appropriate BMI Diet modification. Physical activity. Encouraged/praised to build confidence. Check with insurance regarding weight loss coverage  Other abnormal glucose Recent A1Cs at goal Education: Reviewed 'ABCs' of diabetes management  Discussed goals to be met and/or maintained include A1C (<7) Blood pressure (<130/80) Cholesterol (LDL <70) Continue Eye Exam yearly  Continue Dental Exam Q6 mo Discussed dietary recommendations Discussed Physical Activity recommendations Check A1C   Medication management All medications  discussed and reviewed in full. All questions and concerns regarding medications addressed.    Hyperlipidemia Discussed lifestyle modifications. Recommended diet heavy in fruits and veggies, omega 3's. Decrease consumption of animal meats, cheeses, and dairy products. Remain active and exercise as tolerated. Continue to monitor. Check lipids/TSH   Anxiety and depression Reviewed relaxation techniques.  Sleep hygiene. Recommended Cognitive Behavioral Therapy (CBT). Recommended mindfulness meditation and exercise.   Insight-oriented psychotherapy given for 16 minutes exclusively. Psychoeducation:  encouraged personality growth wand development through coping techniques and problem-solving skills. Limit/Decrease/Monitor drug/alcohol intake.     Osteoarthritis, bilateral Continue supplements, weight loss, regular gentle exercise Steroid injection administered - tolerated well.  Recent lab work completed 02/03/22.  Reviewed and stable.  Meds ordered this encounter  Medications   dexamethasone (DECADRON) injection 10 mg    Notify office for further evaluation and treatment, questions or concerns if any reported s/s fail to improve.   The patient was advised to call back or seek an in-person evaluation if any symptoms worsen or if the condition fails to improve as anticipated.   Further disposition pending results of labs. Discussed med's effects and SE's.    I discussed the assessment and treatment plan with the patient. The patient was provided an opportunity to ask questions and all were answered. The patient agreed with the plan and demonstrated an understanding of the instructions.  Discussed med's effects and SE's. Screening labs and tests as requested with regular follow-up as recommended.  I provided 35 minutes of face-to-face time during this encounter including counseling, chart review, and critical decision making was preformed.  Future Appointments  Date Time Provider  [place]  04/17/2022 11:20 AM Shirley Friar, PA-C [facility]  04/30/2022  3:10 PM Richardson Dopp T, PA-C [facility]  08/04/2022  2:00 PM Unk Pinto, MD [facility] None  03/23/2023  4:00 PM Reene Harlacher, Kenney Houseman, NP [facility] None     Plan:   During the course of the visit the patient was educated and counseled about appropriate screening and preventive services including:   Pneumococcal vaccine  Prevnar 13 Influenza vaccine Td vaccine Screening electrocardiogram Bone densitometry screening Colorectal cancer screening Diabetes screening Glaucoma screening Nutrition counseling  Advanced directives: requested   Subjective:  Taylor Li is a 72 y.o. female who presents for Medicare Annual Wellness Visit and 3 month follow up. She has Essential hypertension; Hyperlipidemia; Vitamin D deficiency; Other abnormal glucose; PAF (paroxysmal atrial fibrillation) ([place]); Medication management; Personal history of colonic polyp- adenoma; Anxiety and depression; COPD (chronic obstructive pulmonary disease) ([place]); Overweight (BMI 25.0-29.9); PVC's (premature ventricular contractions); Thoracic aortic aneurysm ([place]); and Fatigue on their problem list.  She continues to have bilateral shoulder pain. States she had steroid injections in the past which significantly helped. She does have a lot of osteoarthritis but improved when stopped Rosuvastatin. On 10/12/21 she was attempting to get something in her car and felt pain in the right shoulder . The pain is worse when raising arm above head- hurts more in bicep and a little in the shoulder. She is getting numbness and tingling in right hand.   She has remote pAfib in 2002 & 2004 (Req CV), she has history of atypical CP and was evaluated by Dr. Gwenlyn Found, Her 2D echo was entirely normal except for a dilated thoracic aorta measuring 43 mm (has seen Dr. Roxan Hockey, now annual monitoring here, last CTA 09/19/2020 showed  stable at 4.3 cm).  A Myoview stress test was normal as well.  Her event monitor at that time showed PVCs.  She denies having any abnormal heart rhythms that she is aware of.  She feels as though she is in a-fib today.  She continues to follow with cardiology.   she has a diagnosis of depression/anxiety and has taken  wellbutrin 300 mg and celexa 40 mg daily, PRN xanax, reports symptoms are fairly well controlled on current regimen and only with Xanax PRN. she currently takes 1 at night for sleep and this is working well, receptive to taper down after discussion of risks due to pharmacokinetics in older adults, increased risk of unintentional overdose with daily use.  BMI is Body mass index is 29.35 kg/m., she has been working on diet and exercise, likes to dance. She is concerned for increase in weight gain since the holidays.  Wt Readings from Last 3 Encounters:  03/20/22 193 lb (87.5 kg)  02/03/22 189 lb 6.4 oz (85.9 kg)  12/19/21 185 lb 12.8 oz (84.3 kg)    Bp is controlled with Telmisartan 20 mg QD. Her blood pressure today their BP is BP: 110/78  BP Readings from Last 3 Encounters:  03/20/22 110/78  02/03/22 136/86  12/19/21 102/70  She does not workout. She denies chest pain, shortness of breath, dizziness.    She is not on cholesterol medication, taking triple omega 3 supplement (ezetimibe 10 mg daily stopped 2 months ago because she was having shoulder pain.)  Was taking alirocumab via cardiology, but cannot afford, severe myalgias with statin, even with rosuvastatin 5 mg 3 days a week).   Her cholesterol is at goal. The cholesterol last visit was:   Lab Results  Component Value Date   CHOL 210 (H) 02/03/2022   HDL 102 02/03/2022   LDLCALC 90 02/03/2022   TRIG 90 02/03/2022   CHOLHDL 2.1 02/03/2022    She has not  been working on diet and exercise for glucose management, and denies foot ulcerations, increased appetite, nausea, paresthesia of the feet, polydipsia, polyuria and  visual disturbances. Last A1C in the office was:  Lab Results  Component Value Date   HGBA1C 6.0 (H) 02/03/2022   Last GFR: Lab Results  Component Value Date   EGFR 95 02/03/2022    Patient is newly on Vitamin D supplement.   Lab Results  Component Value Date   VD25OH 44 02/03/2022      Medication Review: Current Outpatient Medications on File Prior to Visit  Medication Sig Dispense Refill   ALPRAZolam (XANAX) 1 MG tablet TAKE 1/2 TO 1 TABLET BY MOUTH AT BEDTIME 25 tablet 0   amiodarone (PACERONE) 200 MG tablet Take 1 tablet (200 mg total) by mouth daily. 30 tablet 6   apixaban (ELIQUIS) 5 MG TABS tablet Take 1 tablet (5 mg total) by mouth 2 (two) times daily. 180 tablet 3   Ascorbic Acid (VITAMIN C) 1000 MG tablet Take 2,000 mg by mouth daily.     Cholecalciferol (DIALYVITE VITAMIN D 5000) 125 MCG (5000 UT) capsule Take 5,000 Units by mouth daily.     cyclobenzaprine (FLEXERIL) 10 MG tablet TAKE 1/2 TO 1 TABLET (5-10 MG TOTAL) BY MOUTH EVERY DAY AT BEDTIME 45 tablet 0   ezetimibe (ZETIA) 10 MG tablet Take 10 mg by mouth daily.     Magnesium 500 MG TABS Take 1,000 mg by mouth daily. Takes 2 tablets daily     saccharomyces boulardii (FLORASTOR) 250 MG capsule Take 250 mg by mouth daily.     telmisartan (MICARDIS) 20 MG tablet TAKE 1 TABLET BY MOUTH EVERY DAY FOR BLOOD PRESSURE 90 tablet 3   Zinc 50 MG TABS Take by mouth every other day.     No current facility-administered medications on file prior to visit.    Allergies  Allergen Reactions   Rosuvastatin     myalgias   Other Itching and Rash    Fresh green peppers    Current Problems (verified) Patient Active Problem List   Diagnosis Date Noted   Fatigue 02/16/2020   PVC's (premature ventricular contractions) 09/04/2017   Thoracic aortic aneurysm ([place]) 09/04/2017   Overweight (BMI 25.0-29.9) 03/10/2017   COPD (chronic obstructive pulmonary disease) ([place]) 09/29/2016   Anxiety and depression 12/06/2013   Personal  history of colonic polyp- adenoma 09/22/2013   Medication management 09/09/2013   Essential hypertension 01/14/2013   Hyperlipidemia 01/14/2013   Vitamin D deficiency 01/14/2013   Other abnormal glucose 01/14/2013   PAF (paroxysmal atrial fibrillation) ([place]) 01/14/2013    Screening Tests Immunization History  Administered Date(s) Administered   DTaP 01/13/2001   Influenza Inj Mdck Quad With Preservative 09/30/2016   Influenza Whole 10/06/2012   Influenza, High Dose Seasonal PF 10/15/2017, 10/24/2019, 12/09/2021   Influenza, Quadrivalent, Recombinant, Inj, Pf 10/15/2018   Influenza,inj,Quad PF,6+ Mos 10/27/2020   Influenza,inj,quad, With Preservative 10/18/2015   Influenza-Unspecified 11/11/2013, 11/15/2014   PFIZER Comirnaty(Gray Top)Covid-19 Tri-Sucrose Vaccine 05/15/2020   PFIZER(Purple Top)SARS-COV-2 Vaccination 02/25/2019, 03/22/2019, 10/24/2019   PPD Test 01/14/2013, 04/17/2014, 04/16/2015, 05/15/2016   Pfizer Covid-19 Vaccine Bivalent Booster 88yr & up 10/27/2020   Pneumococcal Conjugate-13 06/15/2017   Pneumococcal Polysaccharide-23 01/13/2001, 02/01/2019   Tdap 10/18/2015   Zoster Recombinat (Shingrix) 03/29/2021, 06/14/2021   Zoster, Live 11/19/2009   Health Maintenance  Topic Date Due   COVID-19 Vaccine (6 - 2023-24 season) 09/13/2021   Medicare Annual Wellness (AWV)  12/10/2022   DTaP/Tdap/Td (3 -  Td or Tdap) 10/17/2025   COLONOSCOPY (Pts 45-62yr Insurance coverage will need to be confirmed)  10/26/2030   Pneumonia Vaccine 70 Years old  Completed   INFLUENZA VACCINE  Completed   DEXA SCAN  Completed   Hepatitis C Screening  Completed   Zoster Vaccines- Shingrix  Completed   HPV VACCINES  Aged Out    Preventative care: Last colonoscopy: 09/2013 polyp, repeat 10/2020 by Dr. GCarlean Purlwas normal, recommended no further. Discuss cologuard in 2032.  Last mammogram: has implants, last 2015, was advised no further mammograms, to get breast MRI if any concerns, doing  regular self exams Last pap smear/pelvic exam: hysterectomy   DEXA: 2011, patient req to repeat screening this year, order placed   Shringrix: Discussed vaccination  Names of Other Physician/Practitioners you currently use: 1. [facility] here for primary care 2. [facility], last visit 2023,  3. Dr, DLav[facility] Chickdentist, last visit 2023  Patient Care Team: MUnk Pinto MD as PCP - General (Internal Medicine) BLorretta Harp MD as PCP - Cardiology (Cardiology)  SURGICAL HISTORY She  has a past surgical history that includes Laminectomy (1982); Breast surgery (Bilateral, 1983); Vaginal hysterectomy (1985); Tonsillectomy; Nasal septum surgery; Cardioversion; Appendectomy; Cholecystectomy; Colonoscopy; TEE without cardioversion (N/A, 12/13/2021); and Cardioversion (N/A, 12/13/2021). FAMILY HISTORY Her family history includes Atrial fibrillation in her mother; Barrett's esophagus in her mother; Clotting disorder in her father; Hypertension in her father and mother; Lung cancer in her father; Mitral valve prolapse in her mother; Thyroid disease in her father. SOCIAL HISTORY She  reports that she quit smoking about 9 years ago. Her smoking use included cigarettes and e-cigarettes. She has never used smokeless tobacco. She reports current alcohol use. She reports that she does not use drugs.   MEDICARE WELLNESS OBJECTIVES: Physical activity:   Cardiac risk factors:   Depression/mood screen:      02/03/2022    1:03 AM  Depression screen PHQ 2/9  Decreased Interest 0  Down, Depressed, Hopeless 0  PHQ - 2 Score 0    ADLs:     02/03/2022    9:56 PM 12/09/2021    4:27 PM  In your present state of health, do you have any difficulty performing the following activities:  Hearing? 0 0  Vision? 0 0  Difficulty concentrating or making decisions? 0 0  Walking or climbing stairs? 0   Dressing or bathing? 0 0  Doing errands, shopping? 0 0      Cognitive Testing  Alert? Yes  Normal Appearance?Yes  Oriented to person? Yes  Place? Yes   Time? Yes  Recall of three objects?  Yes  Can perform simple calculations? Yes  Displays appropriate judgment?Yes  Can read the correct time from a watch face?Yes  EOL planning:    Review of Systems  Constitutional:  Negative for malaise/fatigue and weight loss.  HENT:  Negative for hearing loss and tinnitus.   Eyes:  Negative for blurred vision and double vision.  Respiratory:  Negative for cough, sputum production, shortness of breath and wheezing.   Cardiovascular:  Negative for chest pain, palpitations, orthopnea, claudication, leg swelling and PND.  Gastrointestinal:  Negative for abdominal pain, blood in stool, constipation, diarrhea, heartburn, melena, nausea and vomiting.  Genitourinary: Negative.   Musculoskeletal:  Positive for joint pain (Right shoulder and bicep). Negative for falls and myalgias.  Skin:  Negative for rash.  Neurological:  Negative for dizziness, tingling, sensory change, weakness and headaches.  Endo/Heme/Allergies:  Negative for polydipsia.  Psychiatric/Behavioral:  Positive for depression (in remission on meds). Negative for memory loss, substance abuse and suicidal ideas. The patient is nervous/anxious and has insomnia (controlled with xanax).   All other systems reviewed and are negative.    Objective:     Today's Vitals   03/20/22 1611  BP: 110/78  Pulse: 75  Temp: (!) 97.5 F (36.4 C)  SpO2: 99%  Weight: 193 lb (87.5 kg)  Height: '5\' 8"'$  (1.727 m)   Body mass index is 29.35 kg/m.  General appearance: alert, no distress, WD/WN, female HEENT: normocephalic, sclerae anicteric, TMs pearly, nares patent, no discharge or erythema, pharynx normal Oral cavity: MMM, no lesions Neck: supple, no lymphadenopathy, no thyromegaly, no masses Heart: Irregularly irregular, Atrial fib on EKG Lungs: CTA bilaterally, no wheezes, rhonchi, or rales Abdomen:  +bs, soft, non tender, non distended, no masses, no hepatomegaly, no splenomegaly Musculoskeletal: nontender, no swelling, DIP and PIP joint enlargment, tenderness and erythema to left 2nd digit.  Right shoulder tenderness anteriorly, decreased ROM of right shoulder and right bicep, small mass in middle of right bicep that is very tender Pulses: 2+ symmetric, upper and lower extremities, normal cap refill Neurological: alert, oriented x 3, CN2-12 intact, strength normal upper extremities and lower extremities, sensation normal throughout, DTRs 2+ throughout, no cerebellar signs, gait normal Psychiatric: normal affect, behavior normal, pleasant  EKG: Atrial Fibrillation  Medicare Attestation I have personally reviewed: The patient's medical and social history Their use of alcohol, tobacco or illicit drugs Their current medications and supplements The patient's functional ability including ADLs,fall risks, home safety risks, cognitive, and hearing and visual impairment Diet and physical activities Evidence for depression or mood disorders  The patient's weight, height, BMI, and visual acuity have been recorded in the chart.  I have made referrals, counseling, and provided education to the patient based on review of the above and I have provided the patient with a written personalized care plan for preventive services.     Darrol Jump, NP   03/20/2022

## 2022-03-21 ENCOUNTER — Encounter: Payer: Self-pay | Admitting: Nurse Practitioner

## 2022-03-21 ENCOUNTER — Other Ambulatory Visit: Payer: Self-pay | Admitting: Nurse Practitioner

## 2022-03-21 ENCOUNTER — Telehealth: Payer: Self-pay

## 2022-03-21 ENCOUNTER — Encounter: Payer: Self-pay | Admitting: Cardiology

## 2022-03-21 DIAGNOSIS — M158 Other polyosteoarthritis: Secondary | ICD-10-CM

## 2022-03-21 DIAGNOSIS — I10 Essential (primary) hypertension: Secondary | ICD-10-CM

## 2022-03-21 DIAGNOSIS — E663 Overweight: Secondary | ICD-10-CM

## 2022-03-21 DIAGNOSIS — R7309 Other abnormal glucose: Secondary | ICD-10-CM

## 2022-03-21 DIAGNOSIS — E782 Mixed hyperlipidemia: Secondary | ICD-10-CM

## 2022-03-21 DIAGNOSIS — R6889 Other general symptoms and signs: Secondary | ICD-10-CM | POA: Diagnosis not present

## 2022-03-21 DIAGNOSIS — Z0001 Encounter for general adult medical examination with abnormal findings: Secondary | ICD-10-CM | POA: Diagnosis not present

## 2022-03-21 MED ORDER — WEGOVY 0.25 MG/0.5ML ~~LOC~~ SOAJ: 0.2500 mg | SUBCUTANEOUS | 1 refills | Status: DC

## 2022-03-21 MED ORDER — DEXAMETHASONE SODIUM PHOSPHATE 10 MG/ML IJ SOLN
10.0000 mg | Freq: Once | INTRAMUSCULAR | Status: AC
  Administered 2022-03-21: 10 mg via INTRAMUSCULAR

## 2022-03-21 NOTE — Telephone Encounter (Signed)
Per Richardson Dopp- his message below can we please call patient and get her scheduled for AFIB clinic.  Yes, I would try to get her into the AFib clinic. That is the quickest way to get a patient set up for cardioversion. The DCCV would help restore sinus rhythm now. The ablation will help reduce the chances of recurrent atrial fibrillation in the future. Richardson Dopp, PA-C    03/21/2022 12:13 PM

## 2022-03-21 NOTE — Telephone Encounter (Signed)
Yes, I would try to get her into the AFib clinic. That is the quickest way to get a patient set up for cardioversion. The DCCV would help restore sinus rhythm now. The ablation will help reduce the chances of recurrent atrial fibrillation in the future. Richardson Dopp, PA-C    03/21/2022 12:13 PM

## 2022-03-21 NOTE — Progress Notes (Signed)
Patient confirmed insurance has weight loss coverage - start [id number].

## 2022-03-21 NOTE — Telephone Encounter (Signed)
Please call the patient. There is another MyChart message that indicates her HR is 220. If her HR is going up to 220, she needs to go to the ED. If her HR is less than 140, see if she can see the AFib clinic to possibly arrange a DCCV. Richardson Dopp, PA-C    03/21/2022 9:53 AM

## 2022-03-24 ENCOUNTER — Encounter: Payer: Self-pay | Admitting: Cardiology

## 2022-03-24 MED ORDER — ZEPBOUND 2.5 MG/0.5ML ~~LOC~~ SOAJ: 2.5000 mg | SUBCUTANEOUS | 2 refills | Status: DC

## 2022-03-24 NOTE — Patient Instructions (Signed)

## 2022-03-26 ENCOUNTER — Ambulatory Visit (HOSPITAL_COMMUNITY)
Admission: RE | Admit: 2022-03-26 | Discharge: 2022-03-26 | Disposition: A | Discharge status: Home / Self Care | Payer: PPO | Source: Ambulatory Visit | Attending: Physician Assistant | Admitting: Physician Assistant

## 2022-03-26 ENCOUNTER — Encounter (HOSPITAL_COMMUNITY): Payer: Self-pay | Admitting: Physician Assistant

## 2022-03-26 VITALS — BP 102/88 | HR 70 | Ht 68.0 in | Wt 192.6 lb

## 2022-03-26 DIAGNOSIS — D6869 Other thrombophilia: Secondary | ICD-10-CM | POA: Diagnosis not present

## 2022-03-26 DIAGNOSIS — Z7901 Long term (current) use of anticoagulants: Secondary | ICD-10-CM | POA: Insufficient documentation

## 2022-03-26 DIAGNOSIS — I4819 Other persistent atrial fibrillation: Secondary | ICD-10-CM

## 2022-03-26 DIAGNOSIS — Z87891 Personal history of nicotine dependence: Secondary | ICD-10-CM | POA: Insufficient documentation

## 2022-03-26 DIAGNOSIS — E785 Hyperlipidemia, unspecified: Secondary | ICD-10-CM | POA: Insufficient documentation

## 2022-03-26 DIAGNOSIS — Z8249 Family history of ischemic heart disease and other diseases of the circulatory system: Secondary | ICD-10-CM | POA: Diagnosis not present

## 2022-03-26 DIAGNOSIS — I10 Essential (primary) hypertension: Secondary | ICD-10-CM | POA: Insufficient documentation

## 2022-03-26 LAB — COMPREHENSIVE METABOLIC PANEL
ALT: 23 U/L (ref 0–44)
AST: 16 U/L (ref 15–41)
Albumin: 3.6 g/dL (ref 3.5–5.0)
Alkaline Phosphatase: 63 U/L (ref 38–126)
Anion gap: 12 (ref 5–15)
BUN: 18 mg/dL (ref 8–23)
CO2: 25 mmol/L (ref 22–32)
Calcium: 9.1 mg/dL (ref 8.9–10.3)
Chloride: 104 mmol/L (ref 98–111)
Creatinine, Ser: 0.8 mg/dL (ref 0.44–1.00)
GFR, Estimated: >60 mL/min (ref >60)
Glucose, Bld: 114 mg/dL — ABNORMAL HIGH (ref 70–99)
Potassium: 4.5 mmol/L (ref 3.5–5.1)
Sodium: 141 mmol/L (ref 135–145)
Total Bilirubin: 0.4 mg/dL (ref 0.3–1.2)
Total Protein: 6.5 g/dL (ref 6.5–8.1)

## 2022-03-26 LAB — TSH: TSH: 1.461 u[IU]/mL (ref 0.350–4.500)

## 2022-03-26 MED ORDER — DILTIAZEM HCL 30 MG PO TABS: ORAL_TABLET | ORAL | 1 refills | Status: AC

## 2022-03-26 NOTE — Progress Notes (Signed)
Primary Care Physician: Unk Pinto, MD Primary Cardiologist: Dr Gwenlyn Found Primary Electrophysiologist: Dr Curt Bears Referring Physician: Dr Corinna Capra is a 72 y.o. female with a history of HTN, HLD, thoracic aortic aneurysm, atrial fibrillation who presents for follow up in the [facility].  She is status post TEE cardioversion 12/13/2021. She developed COVID in October and had been fatigued since then. She also has a history of Mobitz 1 AV block and thus rate control has not been started.  She presented to the emergency room with palpitations 12/17/21. She was given adenosine which apparently converted her to sinus rhythm. It was thought that this was all atrial fibrillation and atrial flutter. She was seen by Dr Curt Bears and started on amiodarone as a bridge to ablation. Patient is on Eliquis for a CHADS2VASC score of 4.   Patient reached out via Mychart with elevated heart rates, referred to AF clinic to consider DCCV.   On follow up today, patient is back in [place]. However, she is having near constant fatigue with exertion. No bleeding issues on anticoagulation. Patient is scheduled for ablation 05/06/22.  Today, she denies symptoms of chest pain, shortness of breath, orthopnea, PND, lower extremity edema, dizziness, presyncope, syncope, snoring, daytime somnolence, bleeding, or neurologic sequela. The patient is tolerating medications without difficulties and is otherwise without complaint today.    Atrial Fibrillation Risk Factors:  she does not have symptoms or diagnosis of sleep apnea. she does not have a history of rheumatic fever.   she has a BMI of Body mass index is 29.28 kg/m.Marland Kitchen Filed Weights   03/26/22 0938  Weight: 87.4 kg    Family History  Problem Relation Age of Onset   Hypertension Mother    Barrett's esophagus Mother    Mitral valve prolapse Mother    Atrial fibrillation Mother    Hypertension Father    Lung cancer Father     Thyroid disease Father    Clotting disorder Father    Colon cancer Neg Hx    Diabetes Neg Hx    Esophageal cancer Neg Hx    Pancreatic cancer Neg Hx    Stomach cancer Neg Hx      Atrial Fibrillation Management history:  Previous antiarrhythmic drugs: amiodarone  Previous cardioversions: 12/13/21 Previous ablations: none CHADS2VASC score: 4 Anticoagulation history: Eliquis   Past Medical History:  Diagnosis Date   A-fib ([place])    Arthritis    C. difficile diarrhea    Hyperlipidemia    Hypertension    Personal history of colonic polyp- adenoma 09/22/2013   Pre-diabetes    Rectal prolapse    Vitamin D deficiency    Past Surgical History:  Procedure Laterality Date   APPENDECTOMY     BREAST SURGERY Bilateral 1983   SQ mastectomies   CARDIOVERSION     CARDIOVERSION N/A 12/13/2021   Procedure: CARDIOVERSION;  Surgeon: Skeet Latch, MD;  Location: [facility] ENDOSCOPY;  Service: Cardiovascular;  Laterality: N/A;   CHOLECYSTECTOMY     COLONOSCOPY     LAMINECTOMY  1982   L4-L5   NASAL SEPTUM SURGERY     TEE WITHOUT CARDIOVERSION N/A 12/13/2021   Procedure: TRANSESOPHAGEAL ECHOCARDIOGRAM (TEE);  Surgeon: Skeet Latch, MD;  Location: [facility];  Service: Cardiovascular;  Laterality: N/A;   TONSILLECTOMY     VAGINAL HYSTERECTOMY  1985    Current Outpatient Medications  Medication Sig Dispense Refill   ALPRAZolam (XANAX) 1 MG tablet TAKE 1/2 TO 1 TABLET  BY MOUTH AT BEDTIME 25 tablet 0   amiodarone (PACERONE) 200 MG tablet Take 1 tablet (200 mg total) by mouth daily. 30 tablet 6   apixaban (ELIQUIS) 5 MG TABS tablet Take 1 tablet (5 mg total) by mouth 2 (two) times daily. 180 tablet 3   Ascorbic Acid (VITAMIN C) 1000 MG tablet Take 2,000 mg by mouth daily.     Cholecalciferol (DIALYVITE VITAMIN D 5000) 125 MCG (5000 UT) capsule Take 5,000 Units by mouth daily.     cyclobenzaprine (FLEXERIL) 10 MG tablet TAKE 1/2 TO 1 TABLET (5-10 MG TOTAL) BY MOUTH EVERY DAY AT BEDTIME  45 tablet 0   ezetimibe (ZETIA) 10 MG tablet Take 10 mg by mouth daily.     Magnesium 500 MG TABS Take 1,000 mg by mouth daily. Takes 2 tablets daily     saccharomyces boulardii (FLORASTOR) 250 MG capsule Take 250 mg by mouth daily.     telmisartan (MICARDIS) 20 MG tablet TAKE 1 TABLET BY MOUTH EVERY DAY FOR BLOOD PRESSURE 90 tablet 3   tirzepatide (ZEPBOUND) 2.5 MG/0.5ML Pen Inject 2.5 mg into the skin once a week. 2 mL 2   Zinc 50 MG TABS Take by mouth every other day.     No current facility-administered medications for this encounter.    Allergies  Allergen Reactions   Rosuvastatin     myalgias   Other Itching and Rash    Fresh green peppers    Social History   Socioeconomic History   Marital status: Married    Spouse name: Not on file   Number of children: Not on file   Years of education: Not on file   Highest education level: Not on file  Occupational History   Occupation: RN  Tobacco Use   Smoking status: Former    Types: Cigarettes, E-cigarettes    Quit date: 04/18/2012    Years since quitting: 9.9   Smokeless tobacco: Never   Tobacco comments:    Former smoker 03/26/22  Vaping Use   Vaping Use: Never used  Substance and Sexual Activity   Alcohol use: Yes    Comment: 3-4 glasses wine weekly 03/26/22   Drug use: No   Sexual activity: Not on file  Other Topics Concern   Not on file  Social History Narrative   Married, Therapist, sports   Former tobacco smoker now Patent attorney   + EtOH and caffeine   No drugs   Social Determinants of Radio broadcast assistant Strain: Not on file  Food Insecurity: Not on file  Transportation Needs: Not on file  Physical Activity: Not on file  Stress: Not on file  Social Connections: Not on file  Intimate Partner Violence: Not on file     ROS- All systems are reviewed and negative except as per the HPI above.  Physical Exam: Vitals:   03/26/22 0938  BP: 102/88  Pulse: 70  Weight: 87.4 kg  Height: '5\' 8"'$  (1.727 m)    GEN- The  patient is a well appearing female, alert and oriented x 3 today.   Head- normocephalic, atraumatic Eyes-  Sclera clear, conjunctiva pink Ears- hearing intact Oropharynx- clear Neck- supple  Lungs- Clear to ausculation bilaterally, normal work of breathing Heart- Regular rate and rhythm, occasional ectopic beat, no murmurs, rubs or gallops  GI- soft, NT, ND, + BS Extremities- no clubbing, cyanosis, or edema MS- no significant deformity or atrophy Skin- no rash or lesion Psych- euthymic mood, full affect Neuro- strength and  sensation are intact  Wt Readings from Last 3 Encounters:  03/26/22 87.4 kg  03/20/22 87.5 kg  02/03/22 85.9 kg    EKG today demonstrates  SR, 1st degree AV block, PVC Vent. rate 70 BPM PR interval 282 ms QRS duration 102 ms QT/QTcB 436/470 ms  Echo 01/01/22 demonstrated   1. Left ventricular ejection fraction, by estimation, is 55 to 60%. The  left ventricle has normal function. The left ventricle has no regional  wall motion abnormalities. Left ventricular diastolic parameters are  consistent with Grade I diastolic dysfunction (impaired relaxation). The average left ventricular global longitudinal strain is -18.8 %. The global longitudinal strain is normal.   2. Right ventricular systolic function is normal. The right ventricular  size is normal. There is normal pulmonary artery systolic pressure. The  estimated right ventricular systolic pressure is [id number] mmHg.   3. The mitral valve is normal in structure. No evidence of mitral valve  regurgitation. No evidence of mitral stenosis.   4. The aortic valve is tricuspid. Aortic valve regurgitation is trivial.  No aortic stenosis is present.   5. There is mild dilatation of the ascending aorta, measuring 45 mm.   6. The inferior vena cava is normal in size with greater than 50%  respiratory variability, suggesting right atrial pressure of 3 mmHg.   Comparison(s): A prior study was performed on 03/12/2020. No  significant change from prior study. Very frequent PVCs are seen during the current study. The ascending aorta is dilated (review of images shows a maximum diameter of 4.6 cm in 2022, 4.5 cm on current study).   Epic records are reviewed at length today  CHA2DS2-VASc Score = 4  The patient's score is based upon: CHF History: 0 HTN History: 1 Diabetes History: 0 Stroke History: 0 Vascular Disease History: 1 Age Score: 1 Gender Score: 1       ASSESSMENT AND PLAN: 1. Persistent Atrial Fibrillation (ICD10:  I48.19) The patient's CHA2DS2-VASc score is 4, indicating a 4.8% annual risk of stroke.   Patient has converted back to SR, will not pursue DCCV.  Patient pending afib ablation with Dr Curt Bears on 05/06/22 Will avoid scheduled rate control with her history of prolonged PR and Mobitz I AV block. Will start diltiazem 30 mg PRN q 4 hours for heart racing.  Continue amiodarone 200 mg daily Will check cmet/TSH today Continue Eliquis 5 mg BID  2. Secondary Hypercoagulable State (ICD10:  D68.69) The patient is at significant risk for stroke/thromboembolism based upon her CHA2DS2-VASc Score of 4.  Continue Apixaban (Eliquis).   3. HTN Stable, no changes today.   Follow up with Oda Kilts as scheduled.    [facility] [address] [place], [place] [address] [phone] 03/26/2022 9:52 AM

## 2022-03-26 NOTE — Patient Instructions (Signed)
Cardizem 30mg -- Take 1 tablet every 4 hours AS NEEDED for AFIB heart rate >100 as long as top BP >100.    

## 2022-03-27 ENCOUNTER — Encounter: Payer: Self-pay | Admitting: Nurse Practitioner

## 2022-03-27 ENCOUNTER — Telehealth: Payer: Self-pay | Admitting: *Deleted

## 2022-03-27 ENCOUNTER — Encounter: Payer: Self-pay | Admitting: Cardiology

## 2022-03-27 DIAGNOSIS — I4819 Other persistent atrial fibrillation: Secondary | ICD-10-CM

## 2022-03-27 NOTE — Telephone Encounter (Signed)
Left message to call back to discuss upcoming ablation next month.

## 2022-03-28 NOTE — Telephone Encounter (Signed)
Left message to call back  

## 2022-04-01 ENCOUNTER — Encounter: Payer: Self-pay | Admitting: Cardiology

## 2022-04-02 ENCOUNTER — Encounter: Payer: Self-pay | Admitting: *Deleted

## 2022-04-02 NOTE — Telephone Encounter (Signed)
Pt informed that CT and  ablation instruction letters will be sent via mychart, and that she will get printed copies at her OV on 4/4. Aware she will get lab work that day. Aware that someone will call her to arrange pre ablation CT. Patient verbalized understanding and agreeable to plan.

## 2022-04-17 ENCOUNTER — Encounter: Payer: Self-pay | Admitting: Student

## 2022-04-17 ENCOUNTER — Ambulatory Visit: Payer: PPO | Attending: Student | Admitting: Student

## 2022-04-17 VITALS — BP 140/88 | HR 68 | Ht 68.0 in | Wt 194.0 lb

## 2022-04-17 DIAGNOSIS — I4819 Other persistent atrial fibrillation: Secondary | ICD-10-CM

## 2022-04-17 DIAGNOSIS — D6869 Other thrombophilia: Secondary | ICD-10-CM

## 2022-04-17 DIAGNOSIS — I10 Essential (primary) hypertension: Secondary | ICD-10-CM

## 2022-04-17 NOTE — Patient Instructions (Signed)
Medication Instructions:  Your physician recommends that you continue on your current medications as directed. Please refer to the Current Medication list given to you today.  *If you need a refill on your cardiac medications before your next appointment, please call your pharmacy*  Lab Work: BMET, CBC--TODAY If you have labs (blood work) drawn today and your tests are completely normal, you will receive your results only by: [place] (if you have MyChart) OR A paper copy in the mail If you have any lab test that is abnormal or we need to change your treatment, we will call you to review the results.  Testing/Procedures: See instruction sheets  Follow-Up: At [facility], you and your health needs are our priority.  As part of our continuing mission to provide you with exceptional heart care, we have created designated Provider Care Teams.  These Care Teams include your primary Cardiologist (physician) and Advanced Practice Providers (APPs -  Physician Assistants and Nurse Practitioners) who all work together to provide you with the care you need, when you need it.  Your next appointment:   As scheduled

## 2022-04-17 NOTE — H&P (View-Only) (Signed)
  Electrophysiology Office Note:   Date:  04/17/2022  ID:  Taylor Li, DOB 08/03/1950, MRN [id number]  Primary Cardiologist: Jonathan Berry, MD Electrophysiologist: Will Martin Camnitz, MD   History of Present Illness:   Taylor Li is a 71 y.o. female with h/o HTN, HLD, thoracic AA, and PAF seen today for routine electrophysiology followup.   Since last being seen in our clinic the patient reports doing well overall. Had one brief episode of AF earlier this week that improved with diltiazem.  she denies chest pain, dyspnea, PND, orthopnea, nausea, vomiting, dizziness, syncope, edema, weight gain, or early satiety.   She is scheduled for AF ablation with Dr. Camnitz later this month.   Review of systems complete and found to be negative unless listed in HPI.   Studies Reviewed:    EKG is not ordered today. EKG from 03/26/2022 reviewed which showed NSR with 1st degree AV block and PVCs at 70 bpm   Risk Assessment/Calculations:    CHA2DS2-VASc Score = 4       Physical Exam:   VS:  BP (!) 140/88   Pulse 68   Ht 5' 8" (1.727 m)   Wt 194 lb (88 kg)   SpO2 98%   BMI 29.50 kg/m    Wt Readings from Last 3 Encounters:  04/17/22 194 lb (88 kg)  03/26/22 192 lb 9.6 oz (87.4 kg)  03/20/22 193 lb (87.5 kg)     GEN: Well nourished, well developed in no acute distress NECK: No JVD; No carotid bruits CARDIAC: Regular rate and rhythm, no murmurs, rubs, gallops RESPIRATORY:  Clear to auscultation without rales, wheezing or rhonchi  ABDOMEN: Soft, non-tender, non-distended EXTREMITIES:  No edema; No deformity   ASSESSMENT AND PLAN:    Persistent Atrial Fibrillation Continue eliquis 5 mg BID for CHA2DS2VASc  of at least 4 She is pending afib ablation with Dr Camnitz on 05/06/22 Will avoid scheduled rate control with her history of prolonged PR and Mobitz I AV block.  Continue diltiazem 30 mg PRN q 4 hours for heart racing.  Continue amiodarone 200 mg daily Cmet/TSH stable  03/26/22   Secondary Hypercoagulable State  The patient is at significant risk for stroke/thromboembolism based upon her CHA2DS2-VASc Score of 4. Continue Apixaban (Eliquis) as above   HTN Stable on current regimen     Follow up with Dr. Camnitz as usual post procedure  Signed, Bolden Hagerman Andrew Haddon Fyfe, PA-C  

## 2022-04-17 NOTE — Progress Notes (Signed)
  Electrophysiology Office Note:   Date:  04/17/2022  ID:  Taylor Li, DOB Oct 19, 1950, MRN [id number]  Primary Cardiologist: Quay Burow, MD Electrophysiologist: Constance Haw, MD   History of Present Illness:   Taylor Li is a 72 y.o. female with h/o HTN, HLD, thoracic AA, and PAF seen today for routine electrophysiology followup.   Since last being seen in our clinic the patient reports doing well overall. Had one brief episode of AF earlier this week that improved with diltiazem.  she denies chest pain, dyspnea, PND, orthopnea, nausea, vomiting, dizziness, syncope, edema, weight gain, or early satiety.   She is scheduled for AF ablation with Dr. Curt Bears later this month.   Review of systems complete and found to be negative unless listed in HPI.   Studies Reviewed:    EKG is not ordered today. EKG from 03/26/2022 reviewed which showed NSR with 1st degree AV block and PVCs at 70 bpm   Risk Assessment/Calculations:    CHA2DS2-VASc Score = 4       Physical Exam:   VS:  BP (!) 140/88   Pulse 68   Ht 5\' 8"  (1.727 m)   Wt 194 lb (88 kg)   SpO2 98%   BMI 29.50 kg/m    Wt Readings from Last 3 Encounters:  04/17/22 194 lb (88 kg)  03/26/22 192 lb 9.6 oz (87.4 kg)  03/20/22 193 lb (87.5 kg)     GEN: Well nourished, well developed in no acute distress NECK: No JVD; No carotid bruits CARDIAC: Regular rate and rhythm, no murmurs, rubs, gallops RESPIRATORY:  Clear to auscultation without rales, wheezing or rhonchi  ABDOMEN: Soft, non-tender, non-distended EXTREMITIES:  No edema; No deformity   ASSESSMENT AND PLAN:    Persistent Atrial Fibrillation Continue eliquis 5 mg BID for CHA2DS2VASc  of at least 4 She is pending afib ablation with Dr Curt Bears on 05/06/22 Will avoid scheduled rate control with her history of prolonged PR and Mobitz I AV block.  Continue diltiazem 30 mg PRN q 4 hours for heart racing.  Continue amiodarone 200 mg daily Cmet/TSH stable  03/26/22   Secondary Hypercoagulable State  The patient is at significant risk for stroke/thromboembolism based upon her CHA2DS2-VASc Score of 4. Continue Apixaban (Eliquis) as above   HTN Stable on current regimen     Follow up with Dr. Curt Bears as usual post procedure  Signed, Shirley Friar, PA-C

## 2022-04-18 LAB — BASIC METABOLIC PANEL
BUN/Creatinine Ratio: 22 (ref 12–28)
BUN: 17 mg/dL (ref 8–27)
CO2: 24 mmol/L (ref 20–29)
Calcium: 9.5 mg/dL (ref 8.7–10.3)
Chloride: 102 mmol/L (ref 96–106)
Creatinine, Ser: 0.78 mg/dL (ref 0.57–1.00)
Glucose: 98 mg/dL (ref 70–99)
Potassium: 4.7 mmol/L (ref 3.5–5.2)
Sodium: 140 mmol/L (ref 134–144)
eGFR: 82 mL/min/{1.73_m2} (ref >59)

## 2022-04-18 LAB — CBC
Hematocrit: 42.1 % (ref 34.0–46.6)
Hemoglobin: 14.2 g/dL (ref 11.1–15.9)
MCH: 30.3 pg (ref 26.6–33.0)
MCHC: 33.7 g/dL (ref 31.5–35.7)
MCV: 90 fL (ref 79–97)
Platelets: 265 10*3/uL (ref 150–450)
RBC: 4.68 x10E6/uL (ref 3.77–5.28)
RDW: 12.7 % (ref 11.7–15.4)
WBC: 8 10*3/uL (ref 3.4–10.8)

## 2022-04-27 ENCOUNTER — Telehealth: Payer: Self-pay | Admitting: Cardiology

## 2022-04-27 NOTE — Pre-Procedure Instructions (Addendum)
Attempted to call patient regarding Zepbound.  Left voicemail for hold Zepbound 7 days prior to your procedure.  Procedure is scheduled for Tuesday 4/23.  Don't take Zepbound after 04/28/22, until after procedure.  1345- Patient called back states she is not taking the Zepbound due to the cost.  She also states that her BP has been elevated for the last 4 mornings.  !50-160 over 100's.  I have notified KJ the App for today.  She will follow up with patient.

## 2022-04-27 NOTE — Telephone Encounter (Signed)
Taylor Li with the Cath lab at [facility] notified me that patient has been having issues with high blood pressure. Taylor Li had called the patient to discuss her upcoming Ablation on 4/23, and patient told her that her BP has been elevated to the 150s-160s systolic for the past 5 days.   I contacted patient who told me that this morning her BP was elevated to 164/106. Yesterday, her BP was 154/102. BP has been elevated in this range for the past 5 days. She takes telmisartan 20 mg daily. I told patient that we could either increase telmisartan to 40 mg daily, or she could keep a blood pressure log for the next 2 weeks before making medication changes. Patient would like to increase telmisartan to 40 mg daily. I instructed her to check her BP daily and to let us know if her SBP is less than 100. Also told her to call the office if she develops dizziness, lightheadedness.   She will need a BMP prior to her ablation on 4/23. Taylor Li is aware and will order BMP.   Jonita Albee, PA-C 04/27/2022 4:06 PM

## 2022-04-28 ENCOUNTER — Telehealth (HOSPITAL_COMMUNITY): Payer: Self-pay | Admitting: Emergency Medicine

## 2022-04-28 NOTE — Telephone Encounter (Signed)
Reaching out to patient to offer assistance regarding upcoming cardiac imaging study; pt verbalizes understanding of appt date/time, parking situation and where to check in, pre-test NPO status and medications ordered, and verified current allergies; name and call back number provided for further questions should they arise Rockwell Alexandria RN Navigator Cardiac Imaging [facility] [phone] office [phone] cell   Arrival 330  Denies iv issues Aware contrast

## 2022-04-29 ENCOUNTER — Ambulatory Visit (HOSPITAL_BASED_OUTPATIENT_CLINIC_OR_DEPARTMENT_OTHER)
Admission: RE | Admit: 2022-04-29 | Discharge: 2022-04-29 | Disposition: A | Discharge status: Home / Self Care | Payer: PPO | Source: Ambulatory Visit | Attending: Cardiology | Admitting: Cardiology

## 2022-04-29 DIAGNOSIS — I4819 Other persistent atrial fibrillation: Secondary | ICD-10-CM | POA: Insufficient documentation

## 2022-04-29 MED ORDER — IOHEXOL 350 MG/ML SOLN
100.0000 mL | Freq: Once | INTRAVENOUS | Status: AC | PRN
  Administered 2022-04-29: 80 mL via INTRAVENOUS

## 2022-04-30 ENCOUNTER — Ambulatory Visit: Payer: PPO | Admitting: Physician Assistant

## 2022-05-05 NOTE — Anesthesia Preprocedure Evaluation (Signed)
Anesthesia Evaluation  Patient identified by MRN, date of birth, ID band Patient awake    Reviewed: Allergy & Precautions, NPO status , Patient's Chart, lab work & pertinent test results  History of Anesthesia Complications Negative for: history of anesthetic complications  Airway Mallampati: II  TM Distance: >3 FB Neck ROM: Full    Dental  (+) Dental Advisory Given, Chipped, Caps   Pulmonary COPD, former smoker   Pulmonary exam normal        Cardiovascular hypertension, Pt. on medications Normal cardiovascular exam+ dysrhythmias Atrial Fibrillation    '23 TTE - EF 55 to 60%. Grade I diastolic dysfunction (impaired relaxation). Trivial AI. There is mild dilatation of the ascending aorta, measuring 45 mm.      Neuro/Psych  PSYCHIATRIC DISORDERS Anxiety Depression    negative neurological ROS     GI/Hepatic negative GI ROS, Neg liver ROS,,,  Endo/Other   Pre-DM   Renal/GU negative Renal ROS     Musculoskeletal  (+) Arthritis ,    Abdominal   Peds  Hematology  On eliquis    Anesthesia Other Findings On GLP-1a  Reproductive/Obstetrics                             Anesthesia Physical Anesthesia Plan  ASA: 3  Anesthesia Plan: General   Post-op Pain Management: Tylenol PO (pre-op)*   Induction: Intravenous  PONV Risk Score and Plan: 3 and Treatment may vary due to age or medical condition, Ondansetron and Dexamethasone  Airway Management Planned: Oral ETT  Additional Equipment: None  Intra-op Plan:   Post-operative Plan: Extubation in OR  Informed Consent: I have reviewed the patients History and Physical, chart, labs and discussed the procedure including the risks, benefits and alternatives for the proposed anesthesia with the patient or authorized representative who has indicated his/her understanding and acceptance.     Dental advisory given  Plan Discussed with: CRNA  and Anesthesiologist  Anesthesia Plan Comments:         Anesthesia Quick Evaluation

## 2022-05-05 NOTE — Pre-Procedure Instructions (Signed)
Attempted to call patient regarding procedure instructions for ablation.  Left voicemail on the following items: Arrival time 05:15 Nothing to eat or drink after midnight No meds AM of procedure Responsible person to drive you home and stay with you for 24 hrs  Have you missed any doses of anti-coagulant Eliquis- should be taken twice a day, if you have missed any doses please let the office know right away.  Per previous conversation- patient wasn't taken the Zepbound due to cost.

## 2022-05-06 ENCOUNTER — Encounter (HOSPITAL_COMMUNITY)
Admission: RE | Disposition: A | Discharge status: Home / Self Care | Payer: Self-pay | Source: Ambulatory Visit | Attending: Cardiology

## 2022-05-06 ENCOUNTER — Ambulatory Visit (HOSPITAL_BASED_OUTPATIENT_CLINIC_OR_DEPARTMENT_OTHER): Payer: PPO | Admitting: Anesthesiology

## 2022-05-06 ENCOUNTER — Other Ambulatory Visit: Payer: Self-pay

## 2022-05-06 ENCOUNTER — Ambulatory Visit (HOSPITAL_COMMUNITY)
Admission: RE | Admit: 2022-05-06 | Discharge: 2022-05-06 | Disposition: A | Discharge status: Home / Self Care | Payer: PPO | Source: Ambulatory Visit | Attending: Cardiology | Admitting: Cardiology

## 2022-05-06 ENCOUNTER — Ambulatory Visit (HOSPITAL_COMMUNITY): Payer: PPO | Admitting: Anesthesiology

## 2022-05-06 DIAGNOSIS — E785 Hyperlipidemia, unspecified: Secondary | ICD-10-CM | POA: Insufficient documentation

## 2022-05-06 DIAGNOSIS — F418 Other specified anxiety disorders: Secondary | ICD-10-CM

## 2022-05-06 DIAGNOSIS — D6869 Other thrombophilia: Secondary | ICD-10-CM | POA: Diagnosis not present

## 2022-05-06 DIAGNOSIS — I4891 Unspecified atrial fibrillation: Secondary | ICD-10-CM

## 2022-05-06 DIAGNOSIS — Z87891 Personal history of nicotine dependence: Secondary | ICD-10-CM | POA: Diagnosis not present

## 2022-05-06 DIAGNOSIS — J449 Chronic obstructive pulmonary disease, unspecified: Secondary | ICD-10-CM | POA: Insufficient documentation

## 2022-05-06 DIAGNOSIS — Z79899 Other long term (current) drug therapy: Secondary | ICD-10-CM | POA: Diagnosis not present

## 2022-05-06 DIAGNOSIS — I4819 Other persistent atrial fibrillation: Secondary | ICD-10-CM | POA: Diagnosis not present

## 2022-05-06 DIAGNOSIS — I10 Essential (primary) hypertension: Secondary | ICD-10-CM | POA: Diagnosis not present

## 2022-05-06 DIAGNOSIS — R7303 Prediabetes: Secondary | ICD-10-CM | POA: Insufficient documentation

## 2022-05-06 DIAGNOSIS — I480 Paroxysmal atrial fibrillation: Secondary | ICD-10-CM

## 2022-05-06 DIAGNOSIS — I714 Abdominal aortic aneurysm, without rupture, unspecified: Secondary | ICD-10-CM | POA: Insufficient documentation

## 2022-05-06 DIAGNOSIS — Z7901 Long term (current) use of anticoagulants: Secondary | ICD-10-CM | POA: Diagnosis not present

## 2022-05-06 HISTORY — PX: ATRIAL FIBRILLATION ABLATION: EP1191

## 2022-05-06 LAB — BASIC METABOLIC PANEL
Anion gap: 7 (ref 5–15)
BUN: 23 mg/dL (ref 8–23)
CO2: 25 mmol/L (ref 22–32)
Calcium: 8.9 mg/dL (ref 8.9–10.3)
Chloride: 107 mmol/L (ref 98–111)
Creatinine, Ser: 0.78 mg/dL (ref 0.44–1.00)
GFR, Estimated: >60 mL/min (ref >60)
Glucose, Bld: 91 mg/dL (ref 70–99)
Potassium: 3.7 mmol/L (ref 3.5–5.1)
Sodium: 139 mmol/L (ref 135–145)

## 2022-05-06 LAB — GLUCOSE, CAPILLARY: Glucose-Capillary: 115 mg/dL — ABNORMAL HIGH (ref 70–99)

## 2022-05-06 LAB — POCT ACTIVATED CLOTTING TIME
Activated Clotting Time: 282 seconds
Activated Clotting Time: 304 seconds

## 2022-05-06 SURGERY — ATRIAL FIBRILLATION ABLATION
Anesthesia: General

## 2022-05-06 MED ORDER — ONDANSETRON HCL 4 MG/2ML IJ SOLN: 4.0000 mg | Freq: Four times a day (QID) | INTRAMUSCULAR | Status: DC | PRN

## 2022-05-06 MED ORDER — HEPARIN (PORCINE) IN NACL 1000-0.9 UT/500ML-% IV SOLN
INTRAVENOUS | Status: DC | PRN
  Administered 2022-05-06 (×4): 500 mL

## 2022-05-06 MED ORDER — SODIUM CHLORIDE 0.9 % IV SOLN: 250.0000 mL | INTRAVENOUS | Status: DC | PRN

## 2022-05-06 MED ORDER — PHENYLEPHRINE 80 MCG/ML (10ML) SYRINGE FOR IV PUSH (FOR BLOOD PRESSURE SUPPORT)
PREFILLED_SYRINGE | INTRAVENOUS | Status: DC | PRN
  Administered 2022-05-06: 80 ug via INTRAVENOUS

## 2022-05-06 MED ORDER — SODIUM CHLORIDE 0.9% FLUSH: 3.0000 mL | Freq: Two times a day (BID) | INTRAVENOUS | Status: DC

## 2022-05-06 MED ORDER — PHENYLEPHRINE HCL-NACL 20-0.9 MG/250ML-% IV SOLN
INTRAVENOUS | Status: DC | PRN
  Administered 2022-05-06: 30 ug/min via INTRAVENOUS

## 2022-05-06 MED ORDER — LIDOCAINE 2% (20 MG/ML) 5 ML SYRINGE
INTRAMUSCULAR | Status: DC | PRN
  Administered 2022-05-06: 100 mg via INTRAVENOUS

## 2022-05-06 MED ORDER — DOBUTAMINE INFUSION FOR EP/ECHO/NUC (1000 MCG/ML)
INTRAVENOUS | Status: DC | PRN
  Administered 2022-05-06: 20 ug/kg/min via INTRAVENOUS

## 2022-05-06 MED ORDER — HEPARIN SODIUM (PORCINE) 1000 UNIT/ML IJ SOLN
INTRAMUSCULAR | Status: DC | PRN
  Administered 2022-05-06: 14000 [IU] via INTRAVENOUS
  Administered 2022-05-06: 4000 [IU] via INTRAVENOUS

## 2022-05-06 MED ORDER — SUGAMMADEX SODIUM 200 MG/2ML IV SOLN
INTRAVENOUS | Status: DC | PRN
  Administered 2022-05-06: 200 mg via INTRAVENOUS

## 2022-05-06 MED ORDER — PROPOFOL 10 MG/ML IV BOLUS
INTRAVENOUS | Status: DC | PRN
  Administered 2022-05-06: 130 mg via INTRAVENOUS

## 2022-05-06 MED ORDER — ROCURONIUM BROMIDE 10 MG/ML (PF) SYRINGE
PREFILLED_SYRINGE | INTRAVENOUS | Status: DC | PRN
  Administered 2022-05-06: 60 mg via INTRAVENOUS

## 2022-05-06 MED ORDER — DEXAMETHASONE SODIUM PHOSPHATE 10 MG/ML IJ SOLN
INTRAMUSCULAR | Status: DC | PRN
  Administered 2022-05-06: 10 mg via INTRAVENOUS

## 2022-05-06 MED ORDER — SODIUM CHLORIDE 0.9 % IV SOLN: INTRAVENOUS | Status: DC

## 2022-05-06 MED ORDER — PROTAMINE SULFATE 10 MG/ML IV SOLN
INTRAVENOUS | Status: DC | PRN
  Administered 2022-05-06: 40 mg via INTRAVENOUS

## 2022-05-06 MED ORDER — ACETAMINOPHEN 500 MG PO TABS
1000.0000 mg | ORAL_TABLET | Freq: Once | ORAL | Status: AC
  Administered 2022-05-06: 1000 mg via ORAL
  Filled 2022-05-06: qty 2

## 2022-05-06 MED ORDER — SODIUM CHLORIDE 0.9% FLUSH: 3.0000 mL | INTRAVENOUS | Status: DC | PRN

## 2022-05-06 MED ORDER — ACETAMINOPHEN 325 MG PO TABS
650.0000 mg | ORAL_TABLET | ORAL | Status: DC | PRN
  Administered 2022-05-06: 650 mg via ORAL
  Filled 2022-05-06: qty 2

## 2022-05-06 MED ORDER — HEPARIN SODIUM (PORCINE) 1000 UNIT/ML IJ SOLN
INTRAMUSCULAR | Status: DC | PRN
  Administered 2022-05-06: 1000 [IU] via INTRAVENOUS

## 2022-05-06 MED ORDER — FENTANYL CITRATE (PF) 100 MCG/2ML IJ SOLN
INTRAMUSCULAR | Status: DC | PRN
  Administered 2022-05-06: 100 ug via INTRAVENOUS

## 2022-05-06 MED ORDER — ONDANSETRON HCL 4 MG/2ML IJ SOLN
INTRAMUSCULAR | Status: DC | PRN
  Administered 2022-05-06: 4 mg via INTRAVENOUS

## 2022-05-06 SURGICAL SUPPLY — 19 items
BAG SNAP BAND KOVER 36X36 (MISCELLANEOUS) IMPLANT
CATH ABLAT QDOT MICRO BI TC DF (CATHETERS) IMPLANT
CATH OCTARAY 2.0 F 3-3-3-3-3 (CATHETERS) IMPLANT
CATH PIGTAIL STEERABLE D1 8.7 (WIRE) IMPLANT
CATH S-M CIRCA TEMP PROBE (CATHETERS) IMPLANT
CATH SOUNDSTAR ECO 8FR (CATHETERS) IMPLANT
CATH WEB BI DIR CSDF CRV REPRO (CATHETERS) IMPLANT
CLOSURE PERCLOSE PROSTYLE (VASCULAR PRODUCTS) IMPLANT
COVER SWIFTLINK CONNECTOR (BAG) ×2 IMPLANT
PACK EP LATEX FREE (CUSTOM PROCEDURE TRAY) ×1
PACK EP LF (CUSTOM PROCEDURE TRAY) ×2 IMPLANT
PAD DEFIB RADIO PHYSIO CONN (PAD) ×2 IMPLANT
PATCH CARTO3 (PAD) IMPLANT
SHEATH CARTO VIZIGO SM CVD (SHEATH) IMPLANT
SHEATH PINNACLE 7F 10CM (SHEATH) IMPLANT
SHEATH PINNACLE 8F 10CM (SHEATH) IMPLANT
SHEATH PINNACLE 9F 10CM (SHEATH) IMPLANT
SHEATH PROBE COVER 6X72 (BAG) IMPLANT
TUBING SMART ABLATE COOLFLOW (TUBING) IMPLANT

## 2022-05-06 NOTE — Transfer of Care (Signed)
Immediate Anesthesia Transfer of Care Note  Patient: Taylor Li  Procedure(s) Performed: ATRIAL FIBRILLATION ABLATION  Patient Location: Cath Lab  Anesthesia Type:General  Level of Consciousness: awake and alert   Airway & Oxygen Therapy: Patient Spontanous Breathing and Patient connected to nasal cannula oxygen  Post-op Assessment: Report given to RN and Post -op Vital signs reviewed and stable  Post vital signs: Reviewed and stable  Last Vitals:  Vitals Value Taken Time  BP 112/61 05/06/22 0946  Temp    Pulse 76 05/06/22 0950  Resp 20 05/06/22 0950  SpO2 93 % 05/06/22 0950  Vitals shown include unvalidated device data.  Last Pain:  Vitals:   05/06/22 0549  TempSrc: Oral  PainSc: 0-No pain         Complications: There were no known notable events for this encounter.

## 2022-05-06 NOTE — Anesthesia Procedure Notes (Signed)
Procedure Name: Intubation Date/Time: 05/06/2022 7:50 AM  Performed by: Maxine Glenn, CRNAPre-anesthesia Checklist: Patient identified, Emergency Drugs available, Suction available and Patient being monitored Patient Re-evaluated:Patient Re-evaluated prior to induction Oxygen Delivery Method: Circle System Utilized Preoxygenation: Pre-oxygenation with 100% oxygen Induction Type: IV induction Ventilation: Mask ventilation without difficulty Laryngoscope Size: Mac and 4 Grade View: Grade III Tube type: Oral Tube size: 7.0 mm Number of attempts: 1 Airway Equipment and Method: Stylet Placement Confirmation: ETT inserted through vocal cords under direct vision, positive ETCO2 and breath sounds checked- equal and bilateral Secured at: 21 cm Tube secured with: Tape Dental Injury: Teeth and Oropharynx as per pre-operative assessment

## 2022-05-06 NOTE — Interval H&P Note (Signed)
History and Physical Interval Note:  05/06/2022 7:05 AM  Taylor Li  has presented today for surgery, with the diagnosis of afib.  The various methods of treatment have been discussed with the patient and family. After consideration of risks, benefits and other options for treatment, the patient has consented to  Procedure(s): ATRIAL FIBRILLATION ABLATION (N/A) as a surgical intervention.  The patient's history has been reviewed, patient examined, no change in status, stable for surgery.  I have reviewed the patient's chart and labs.  Questions were answered to the patient's satisfaction.     Matheson Vandehei [employer]

## 2022-05-06 NOTE — Discharge Instructions (Signed)

## 2022-05-06 NOTE — Anesthesia Postprocedure Evaluation (Signed)
Anesthesia Post Note  Patient: Taylor Li  Procedure(s) Performed: ATRIAL FIBRILLATION ABLATION     Patient location during evaluation: PACU Anesthesia Type: General Level of consciousness: awake and alert Pain management: pain level controlled Vital Signs Assessment: post-procedure vital signs reviewed and stable Respiratory status: spontaneous breathing, nonlabored ventilation and respiratory function stable Cardiovascular status: stable and blood pressure returned to baseline Anesthetic complications: no   There were no known notable events for this encounter.  Last Vitals:  Vitals:   05/06/22 1045 05/06/22 1100  BP: 106/64 95/62  Pulse: 72 67  Resp: 18 16  Temp:    SpO2: (!) 88% 92%    Last Pain:  Vitals:   05/06/22 1044  TempSrc:   PainSc: 3                  Beryle Lathe

## 2022-05-07 ENCOUNTER — Encounter (HOSPITAL_COMMUNITY): Payer: Self-pay | Admitting: Cardiology

## 2022-05-07 ENCOUNTER — Telehealth: Payer: Self-pay

## 2022-05-07 NOTE — Telephone Encounter (Signed)
Spoke with pt regarding follow up office visit to see Dr. [name] back. Appointment scheduled. Pt had her a-fib ablation yesterday and she reports that this was a success per Dr. [name]. Pt does mention while she is on the phone that she recently increased her telmisartan from  to  because she was noticing her blood pressure were elevated in the 140-150 range systolic. Pt reports that blood pressure is back to a "normal" level but doesn't provide any values. Will send message to the provider to check on dose increase.

## 2022-05-13 ENCOUNTER — Encounter: Payer: Self-pay | Admitting: Cardiology

## 2022-05-16 DIAGNOSIS — H40033 Anatomical narrow angle, bilateral: Secondary | ICD-10-CM | POA: Diagnosis not present

## 2022-05-16 DIAGNOSIS — H2513 Age-related nuclear cataract, bilateral: Secondary | ICD-10-CM | POA: Diagnosis not present

## 2022-05-18 DIAGNOSIS — I2510 Atherosclerotic heart disease of native coronary artery without angina pectoris: Secondary | ICD-10-CM | POA: Insufficient documentation

## 2022-05-18 NOTE — Progress Notes (Unsigned)
Cardiology Office Note:    Date:  05/19/2022  ID:  Taylor Li, DOB 09-09-1950, MRN [id number] PCP: [name], MD   [facility] Providers Cardiologist:  Nanetta Batty, MD Electrophysiologist:  [name], MD          Patient Profile:   Coronary Artery Ca2+  CT in 2019 Myoview 03/07/2020: EF 59, no ischemia or infarction; low risk Pre PVI ablation CT 04/29/22: CAC score 373 Paroxysmal atrial fibrillation S/p TEE DCCV 12/2021 S/p PVI ablation 05/06/22 (Dr. [name]) Ascending thoracic aortic aneurysm CT 09/2020: 4.3 cm  TTE 03/12/2020: EF 60-65, no RWMA, mild LVH, GR 1 DD, GLS -19.5, normal RVSF, trivial MR, trivial AI, dilated aortic root (39 mm), dilated ascending aorta (39 mm), RAP 3 Pre PVI ablation CT: 41 mm PVCs Monitor 02/2020: 5.6% burden Mobitz Type I (Wenckebach) Noted on monitor 02/2020 Cardiac monitor 03/10/2020: Sinus rhythm, frequent PVCs (5.6% burden), short runs of NSVT; Mobitz type I Seen by EP (Dr. Johney Frame) >> beta-blocker DC'd Hypertension  Hyperlipidemia  Intol to statins Chronic Obstructive Pulmonary Disease         History of Present Illness:   Taylor Li is a 72 y.o. female who returns for f/u of CAD, AFib, thoracic aortic aneurysm. She recently underwent PVI ablation for atrial fibrillation with Dr. [name] on 05/06/22.  She is here alone.  EKG today demonstrates normal sinus rhythm.  She has been somewhat fatigued since her ablation.  She has not felt symptoms of atrial fibrillation in the last few days.  Her dose of telmisartan was increased recently to 40 mg daily.  She has noted better blood pressure since then.  She has an occasional chest discomfort described as a twinge.  This is not necessarily related to exertion.  She has some shortness of breath with some activities.  She has not had syncope, orthopnea, leg edema.  Review of Systems  Gastrointestinal:  Negative for hematochezia and melena.  Genitourinary:  Negative for  hematuria.   see HPI    Studies Reviewed:    EKG: NSR, HR 70, first-degree AV block, PR 250, left axis deviation, nonspecific ST-T wave changes, QTc 464, no change when compared to prior tracings   Risk Assessment/Calculations:    CHA2DS2-VASc Score = 4   This indicates a 4.8% annual risk of stroke. The patient's score is based upon: CHF History: 0 HTN History: 1 Diabetes History: 0 Stroke History: 0 Vascular Disease History: 1 Age Score: 1 Gender Score: 1            Physical Exam:   VS:  BP 103/76   Pulse 70   Ht 5\' 8"  (1.727 m)   Wt 192 lb 9.6 oz (87.4 kg)   SpO2 95%   BMI 29.28 kg/m    Wt Readings from Last 3 Encounters:  05/19/22 192 lb 9.6 oz (87.4 kg)  05/06/22 193 lb (87.5 kg)  04/17/22 194 lb (88 kg)    Constitutional:      Appearance: Healthy appearance. Not in distress.  Neck:     Vascular: JVD normal.  Pulmonary:     Breath sounds: Normal breath sounds. No wheezing. No rales.  Cardiovascular:     Normal rate. Regular rhythm. Normal S1. Normal S2.      Murmurs: There is no murmur.  Edema:    Peripheral edema absent.  Abdominal:     Palpations: Abdomen is soft.       ASSESSMENT AND PLAN:   Coronary artery calcification  Calcification noted on CT in 2019.  She had a CAC score of 373 on her pre-PVI ablation CT.  Her last stress test was in 2022.  She has an occasional chest discomfort described as a twinge.  Her electrocardiogram demonstrates no changes.  We discussed proceeding with stress testing versus watchful waiting.  She prefers to hold off on any testing for now.  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  Otherwise, she can follow-up in 6 months.  She is not on aspirin as she is on Eliquis.  She is intolerant to statins.  Continue ezetimibe.  Essential hypertension Pressure is well-controlled.  Continue telmisartan 40 mg daily.  Hyperlipidemia He is intolerant of statins.  LDL in January 2024 was 90.  I  would recommend an LDL of at least <70, ideally <55.  Continue ezetimibe 10 mg daily.  Refer to Pharm.D. clinic for consideration of PCSK9 inhibitor.  PAF (paroxysmal atrial fibrillation) (HCC) She is currently in sinus rhythm today.  She just underwent PVI ablation for atrial fibrillation last month.  We discussed the possibility of having recurrent atrial fibrillation for the next 3 months.  Follow-up with Dr. [name] as planned.  Continue Eliquis 5 mg twice daily, diltiazem 30 mg as needed.  Thoracic aortic aneurysm (HCC) 41 mm on recent CT scan.      Dispo:  Return in about 6 months (around 11/19/2022).  Signed, Tereso Newcomer, PA-C

## 2022-05-19 ENCOUNTER — Ambulatory Visit: Payer: PPO | Attending: Cardiovascular Disease | Admitting: Physician Assistant

## 2022-05-19 ENCOUNTER — Encounter: Payer: Self-pay | Admitting: Physician Assistant

## 2022-05-19 ENCOUNTER — Other Ambulatory Visit: Payer: Self-pay | Admitting: Nurse Practitioner

## 2022-05-19 VITALS — BP 103/76 | HR 70 | Ht 68.0 in | Wt 192.6 lb

## 2022-05-19 DIAGNOSIS — E782 Mixed hyperlipidemia: Secondary | ICD-10-CM | POA: Diagnosis not present

## 2022-05-19 DIAGNOSIS — I2510 Atherosclerotic heart disease of native coronary artery without angina pectoris: Secondary | ICD-10-CM

## 2022-05-19 DIAGNOSIS — I480 Paroxysmal atrial fibrillation: Secondary | ICD-10-CM | POA: Diagnosis not present

## 2022-05-19 DIAGNOSIS — I2584 Coronary atherosclerosis due to calcified coronary lesion: Secondary | ICD-10-CM

## 2022-05-19 DIAGNOSIS — I7121 Aneurysm of the ascending aorta, without rupture: Secondary | ICD-10-CM

## 2022-05-19 DIAGNOSIS — I10 Essential (primary) hypertension: Secondary | ICD-10-CM | POA: Diagnosis not present

## 2022-05-19 DIAGNOSIS — F411 Generalized anxiety disorder: Secondary | ICD-10-CM

## 2022-05-19 NOTE — Assessment & Plan Note (Signed)
He is intolerant of statins.  LDL in January 2024 was 90.  I would recommend an LDL of at least <70, ideally <55.  Continue ezetimibe 10 mg daily.  Refer to Pharm.D. clinic for consideration of PCSK9 inhibitor.

## 2022-05-19 NOTE — Assessment & Plan Note (Signed)
41 mm on recent CT scan.

## 2022-05-19 NOTE — Assessment & Plan Note (Signed)
Pressure is well-controlled.  Continue telmisartan 40 mg daily.

## 2022-05-19 NOTE — Patient Instructions (Signed)
Medication Instructions:  Your physician recommends that you continue on your current medications as directed. Please refer to the Current Medication list given to you today.  *If you need a refill on your cardiac medications before your next appointment, please call your pharmacy*   Lab Work: None ordered  If you have labs (blood work) drawn today and your tests are completely normal, you will receive your results only by: MyChart Message (if you have MyChart) OR A paper copy in the mail If you have any lab test that is abnormal or we need to change your treatment, we will call you to review the results.   Testing/Procedures: None ordered  You have been referred to [facility] TO DISCUSS PCSK9 INHIBITOR    Follow-Up: At [facility], you and your health needs are our priority.  As part of our continuing mission to provide you with exceptional heart care, we have created designated Provider Care Teams.  These Care Teams include your primary Cardiologist (physician) and Advanced Practice Providers (APPs -  Physician Assistants and Nurse Practitioners) who all work together to provide you with the care you need, when you need it.  We recommend signing up for the patient portal called "MyChart".  Sign up information is provided on this After Visit Summary.  MyChart is used to connect with patients for Virtual Visits (Telemedicine).  Patients are able to view lab/test results, encounter notes, upcoming appointments, etc.  Non-urgent messages can be sent to your provider as well.   To learn more about what you can do with MyChart, go to [email or web address].    Your next appointment:   6 month(s)  Provider:   Tereso Newcomer, PA-C         Other Instructions

## 2022-05-19 NOTE — Assessment & Plan Note (Signed)
Calcification noted on CT in 2019.  She had a CAC score of 373 on her pre-PVI ablation CT.  Her last stress test was in 2022.  She has an occasional chest discomfort described as a twinge.  Her electrocardiogram demonstrates no changes.  We discussed proceeding with stress testing versus watchful waiting.  She prefers to hold off on any testing for now.  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  Otherwise, she can follow-up in 6 months.  She is not on aspirin as she is on Eliquis.  She is intolerant to statins.  Continue ezetimibe.

## 2022-05-19 NOTE — Assessment & Plan Note (Signed)
She is currently in sinus rhythm today.  She just underwent PVI ablation for atrial fibrillation last month.  We discussed the possibility of having recurrent atrial fibrillation for the next 3 months.  Follow-up with Dr. [name] as planned.  Continue Eliquis 5 mg twice daily, diltiazem 30 mg as needed.

## 2022-05-21 DIAGNOSIS — I2510 Atherosclerotic heart disease of native coronary artery without angina pectoris: Secondary | ICD-10-CM

## 2022-05-21 DIAGNOSIS — R072 Precordial pain: Secondary | ICD-10-CM

## 2022-05-21 NOTE — Telephone Encounter (Signed)
Arrange Myocardial PET. DX: CAD, chest pain. Order placed.  Tereso Newcomer, PA-C    05/21/2022 5:50 PM

## 2022-06-03 ENCOUNTER — Encounter (HOSPITAL_COMMUNITY): Payer: Self-pay | Admitting: Physician Assistant

## 2022-06-03 ENCOUNTER — Ambulatory Visit
Admission: RE | Admit: 2022-06-03 | Discharge: 2022-06-03 | Disposition: A | Discharge status: Home / Self Care | Payer: PPO | Source: Ambulatory Visit | Attending: Physician Assistant | Admitting: Physician Assistant

## 2022-06-03 ENCOUNTER — Ambulatory Visit: Payer: PPO | Admitting: Pharmacist

## 2022-06-03 ENCOUNTER — Telehealth: Payer: Self-pay | Admitting: Pharmacist

## 2022-06-03 VITALS — BP 118/86 | HR 79 | Ht 68.0 in | Wt 187.2 lb

## 2022-06-03 DIAGNOSIS — I2510 Atherosclerotic heart disease of native coronary artery without angina pectoris: Secondary | ICD-10-CM | POA: Insufficient documentation

## 2022-06-03 DIAGNOSIS — I10 Essential (primary) hypertension: Secondary | ICD-10-CM | POA: Diagnosis not present

## 2022-06-03 DIAGNOSIS — Z7901 Long term (current) use of anticoagulants: Secondary | ICD-10-CM | POA: Insufficient documentation

## 2022-06-03 DIAGNOSIS — I4819 Other persistent atrial fibrillation: Secondary | ICD-10-CM

## 2022-06-03 DIAGNOSIS — E782 Mixed hyperlipidemia: Secondary | ICD-10-CM

## 2022-06-03 DIAGNOSIS — D6869 Other thrombophilia: Secondary | ICD-10-CM | POA: Insufficient documentation

## 2022-06-03 DIAGNOSIS — Z8249 Family history of ischemic heart disease and other diseases of the circulatory system: Secondary | ICD-10-CM | POA: Diagnosis not present

## 2022-06-03 DIAGNOSIS — Z87891 Personal history of nicotine dependence: Secondary | ICD-10-CM | POA: Insufficient documentation

## 2022-06-03 DIAGNOSIS — I441 Atrioventricular block, second degree: Secondary | ICD-10-CM | POA: Insufficient documentation

## 2022-06-03 DIAGNOSIS — I712 Thoracic aortic aneurysm, without rupture, unspecified: Secondary | ICD-10-CM | POA: Insufficient documentation

## 2022-06-03 DIAGNOSIS — E663 Overweight: Secondary | ICD-10-CM | POA: Diagnosis not present

## 2022-06-03 DIAGNOSIS — Z79899 Other long term (current) drug therapy: Secondary | ICD-10-CM | POA: Diagnosis not present

## 2022-06-03 DIAGNOSIS — E785 Hyperlipidemia, unspecified: Secondary | ICD-10-CM | POA: Diagnosis not present

## 2022-06-03 DIAGNOSIS — R5383 Other fatigue: Secondary | ICD-10-CM | POA: Diagnosis not present

## 2022-06-03 LAB — BASIC METABOLIC PANEL
Anion gap: 8 (ref 5–15)
BUN: 18 mg/dL (ref 8–23)
CO2: 24 mmol/L (ref 22–32)
Calcium: 9.3 mg/dL (ref 8.9–10.3)
Chloride: 105 mmol/L (ref 98–111)
Creatinine, Ser: 0.74 mg/dL (ref 0.44–1.00)
GFR, Estimated: >60 mL/min (ref >60)
Glucose, Bld: 103 mg/dL — ABNORMAL HIGH (ref 70–99)
Potassium: 4.2 mmol/L (ref 3.5–5.1)
Sodium: 137 mmol/L (ref 135–145)

## 2022-06-03 LAB — CBC
HCT: 44.5 % (ref 36.0–46.0)
Hemoglobin: 14.5 g/dL (ref 12.0–15.0)
MCH: 29.9 pg (ref 26.0–34.0)
MCHC: 32.6 g/dL (ref 30.0–36.0)
MCV: 91.8 fL (ref 80.0–100.0)
Platelets: 275 10*3/uL (ref 150–400)
RBC: 4.85 MIL/uL (ref 3.87–5.11)
RDW: 12.8 % (ref 11.5–15.5)
WBC: 8.1 10*3/uL (ref 4.0–10.5)
nRBC: 0 % (ref 0.0–0.2)

## 2022-06-03 NOTE — Progress Notes (Signed)
Patient ID: Taylor Li                 DOB: May 19, 1950                    MRN: [id number]      HPI: Taylor Li is a 72 y.o. female patient referred to lipid clinic by Taylor Newcomer, PA. PMH is significant for afib, CAC 373, ascending thoracic aortic aneurysm, PCV, Mobitz type 1, HTN, HLD and COPD. Patient is intolerant of statins and was referred to lipid clinic.   Patient presents today to lipid clinic accompanied by her husband. She states that she had severe muscle pains when on statins. The pain was all over- hips arms. Also said she had a torn tendon. This happened on rosuvastatin 5mg  daily. Currently on ezetimibe 10mg  daily with an LDL-C of 90. She has an ascending thoracic aortic aneurysm and CAC 373.   Reviewed Repatha injection technique, side effects, cost, patient assistance and dosing.  Current Medications: ezetimibe 10mg  daily Intolerances: rosuvastatin 5mg  (muscle pain all over) Risk Factors:  CAC 373, ascending thoracic aortic aneurysm, age LDL-C goal: <70 (ideally <55)  Diet:  Breakfast: slimfast Lunch: soup, vegetable and tuna- lately hasn't felt like eating so will have a V8 or another slimfast Dinner: salad, fruits, veggie chicken Snack: chocolate, pop-corn, popicles  Exercise:none  Family History:  Family History  Problem Relation Age of Onset   Hypertension Mother    Barrett's esophagus Mother    Mitral valve prolapse Mother    Atrial fibrillation Mother    Hypertension Father    Lung cancer Father    Thyroid disease Father    Clotting disorder Father    Colon cancer Neg Hx    Diabetes Neg Hx    Esophageal cancer Neg Hx    Pancreatic cancer Neg Hx    Stomach cancer Neg Hx     Social History: No tobacco, rare ETOH  Labs: Lipid Panel     Component Value Date/Time   CHOL 210 (H) 02/03/2022 1431   CHOL 171 09/04/2017 0944   TRIG 90 02/03/2022 1431   HDL 102 02/03/2022 1431   HDL 78 09/04/2017 0944   CHOLHDL 2.1 02/03/2022 1431   VLDL 23  05/15/2016 1553   LDLCALC 90 02/03/2022 1431   LABVLDL 16 09/04/2017 0944    Past Medical History:  Diagnosis Date   A-fib (HCC)    Arthritis    C. difficile diarrhea    Hyperlipidemia    Hypertension    Personal history of colonic polyp- adenoma 09/22/2013   Pre-diabetes    Rectal prolapse    Vitamin D deficiency     Current Outpatient Medications on File Prior to Visit  Medication Sig Dispense Refill   acetaminophen (TYLENOL) 650 MG CR tablet Take 1,300 mg by mouth every 8 (eight) hours as needed for pain.     ALPRAZolam (XANAX) 1 MG tablet TAKE 1/2-1 TABLET BY MOUTH AT BEDTIME 25 tablet 0   apixaban (ELIQUIS) 5 MG TABS tablet Take 1 tablet (5 mg total) by mouth 2 (two) times daily. 180 tablet 3   Ascorbic Acid (VITAMIN C) 1000 MG tablet Take 1,000 mg by mouth at bedtime.     Cholecalciferol (DIALYVITE VITAMIN D 5000) 125 MCG (5000 UT) capsule Take 10,000 Units by mouth at bedtime.     cyclobenzaprine (FLEXERIL) 10 MG tablet TAKE 1/2 TO 1 TABLET (5-10 MG TOTAL) BY MOUTH EVERY DAY AT BEDTIME 45  tablet 0   diltiazem (CARDIZEM) 30 MG tablet Take 1 tablet every 4 hours AS NEEDED for AFIB heart rate >100 as long as top BP >100. 30 tablet 1   ezetimibe (ZETIA) 10 MG tablet Take 10 mg by mouth daily.     Magnesium 500 MG TABS Take 500-1,000 mg by mouth at bedtime.     OVER THE COUNTER MEDICATION Apply 1 application  topically daily as needed (Pain). [place] Marijuana     saccharomyces boulardii (FLORASTOR) 250 MG capsule Take 250 mg by mouth daily.     telmisartan (MICARDIS) 20 MG tablet Take 2 tablets by mouth daily. (40 mg total)     Zinc 50 MG TABS Take 50 mg by mouth every other day. At bedtime     No current facility-administered medications on file prior to visit.    Allergies  Allergen Reactions   Rosuvastatin     myalgias   Other Itching and Rash    Fresh green peppers    Assessment/Plan:  1. Hyperlipidemia -  Hyperlipidemia Assessment: LDL-C is above goal of <70  (ideally <55) Patient currently on ezetimibe 10mg  daily Did not tolerate rosuvastatin 5mg  daily We dicussed the 5 modifiable risk factors for ASCVD Currently activity level is down due to fatigue with afib. Hoping to increase activity Encouraged diet high in fiber, low in saturated fat- engaged conversation was had  Plan: Will submit PA for Repatha Will apply for [employer] grant once approved Repeat labs in 2-3 months  Overweight (BMI 25.0-29.9) Assessment: Patient asked about GLP-1 for weight loss Only pre-DM therefore would not be approved under that indication Even if HTA would approved under ASCVD indication, would not be able to afford I encouraged patient to increase physical activity    Thank you,  Olene Floss, Pharm.D, BCPS, CPP [facility] of [facility] [address], [place], [place] [address]  Phone: [phone]; Fax: [phone]

## 2022-06-03 NOTE — Assessment & Plan Note (Signed)
Assessment: Patient asked about GLP-1 for weight loss Only pre-DM therefore would not be approved under that indication Even if HTA would approved under ASCVD indication, would not be able to afford I encouraged patient to increase physical activity

## 2022-06-03 NOTE — Patient Instructions (Signed)
I will submit a prior authorization for Repatha. I will call you once I hear back. Please call me at [phone] with any questions.   Repatha is a cholesterol medication that improved your body's ability to get rid of "bad cholesterol" known as LDL. It can lower your LDL up to 60%! It is an injection that is given under the skin every 2 weeks. The medication often requires a prior authorization from your insurance company. We will take care of submitting all the necessary information to your insurance company to get it approved. The most common side effects of Repatha include runny nose, symptoms of the common cold, rarely flu or flu-like symptoms, back/muscle pain in about 3-4% of the patients, and redness, pain, or bruising at the injection site. Tell your healthcare provider if you have any side effect that bothers you or that does not go away.     

## 2022-06-03 NOTE — Progress Notes (Signed)
Primary Care Physician: [name], MD Primary Cardiologist: Dr [name] Primary Electrophysiologist: Dr [name] Referring Physician: Dr [name] is a 72 y.o. female with a history of HTN, HLD, thoracic aortic aneurysm, atrial fibrillation who presents for follow up in the [facility].  She is status post TEE cardioversion 12/13/2021. She developed COVID in October and had been fatigued since then. She also has a history of Mobitz 1 AV block and thus rate control has not been started.  She presented to the emergency room with palpitations 12/17/21. She was given adenosine which apparently converted her to sinus rhythm. It was thought that this was all atrial fibrillation and atrial flutter. She was seen by Dr [name] and started on amiodarone as a bridge to ablation. Patient is on Eliquis for a CHADS2VASC score of 4.   Patient reached out via Mychart with elevated heart rates, referred to AF clinic to consider DCCV however, she chemically converted to SR.   On follow up today, patient is s/p afib ablation with Dr [name] on 05/06/22. She reports that since the ablation she has felt "50/50" with good days and bad days. She has had some elevated heart rates and taken PRN CCB. She discontinued amiodarone after her ablation. However, over the past week she has also been very fatigued and had some nausea, no vomiting. No fever or chills. She is in SR today. She denies swallowing pain or groin issues. She is pending a cardiac PET scan per Tereso Newcomer.   Today, she denies symptoms of shortness of breath, orthopnea, PND, lower extremity edema, dizziness, presyncope, syncope, snoring, daytime somnolence, bleeding, or neurologic sequela. The patient is tolerating medications without difficulties and is otherwise without complaint today.    Atrial Fibrillation Risk Factors:  she does not have symptoms or diagnosis of sleep apnea. she does not have a history  of rheumatic fever.   she has a BMI of Body mass index is 28.46 kg/m.Marland Kitchen Filed Weights   06/03/22 1042  Weight: 84.9 kg    Family History  Problem Relation Age of Onset   Hypertension Mother    Barrett's esophagus Mother    Mitral valve prolapse Mother    Atrial fibrillation Mother    Hypertension Father    Lung cancer Father    Thyroid disease Father    Clotting disorder Father    Colon cancer Neg Hx    Diabetes Neg Hx    Esophageal cancer Neg Hx    Pancreatic cancer Neg Hx    Stomach cancer Neg Hx      Atrial Fibrillation Management history:  Previous antiarrhythmic drugs: amiodarone  Previous cardioversions: 12/13/21 Previous ablations: 05/06/22 CHADS2VASC score: 4 Anticoagulation history: Eliquis   Past Medical History:  Diagnosis Date   A-fib (HCC)    Arthritis    C. difficile diarrhea    Hyperlipidemia    Hypertension    Personal history of colonic polyp- adenoma 09/22/2013   Pre-diabetes    Rectal prolapse    Vitamin D deficiency    Past Surgical History:  Procedure Laterality Date   APPENDECTOMY     ATRIAL FIBRILLATION ABLATION N/A 05/06/2022   Procedure: ATRIAL FIBRILLATION ABLATION;  Surgeon: [name], MD;  Location: [facility] INVASIVE CV LAB;  Service: Cardiovascular;  Laterality: N/A;   BREAST SURGERY Bilateral 1983   SQ mastectomies   CARDIOVERSION     CARDIOVERSION N/A 12/13/2021   Procedure: CARDIOVERSION;  Surgeon: [name], MD;  Location: [facility] ENDOSCOPY;  Service: Cardiovascular;  Laterality: N/A;   CHOLECYSTECTOMY     COLONOSCOPY     LAMINECTOMY  1982   L4-L5   NASAL SEPTUM SURGERY     TEE WITHOUT CARDIOVERSION N/A 12/13/2021   Procedure: TRANSESOPHAGEAL ECHOCARDIOGRAM (TEE);  Surgeon: [name], MD;  Location: [facility] ENDOSCOPY;  Service: Cardiovascular;  Laterality: N/A;   TONSILLECTOMY     VAGINAL HYSTERECTOMY  1985    Current Outpatient Medications  Medication Sig Dispense Refill   ALPRAZolam (XANAX) 1 MG tablet  TAKE 1/2-1 TABLET BY MOUTH AT BEDTIME 25 tablet 0   apixaban (ELIQUIS) 5 MG TABS tablet Take 1 tablet (5 mg total) by mouth 2 (two) times daily. 180 tablet 3   Ascorbic Acid (VITAMIN C) 1000 MG tablet Take 1,000 mg by mouth at bedtime.     Cholecalciferol (DIALYVITE VITAMIN D 5000) 125 MCG (5000 UT) capsule Take 10,000 Units by mouth at bedtime.     diltiazem (CARDIZEM) 30 MG tablet Take 1 tablet every 4 hours AS NEEDED for AFIB heart rate >100 as long as top BP >100. 30 tablet 1   ezetimibe (ZETIA) 10 MG tablet Take 10 mg by mouth daily.     Magnesium 500 MG TABS Take 500-1,000 mg by mouth at bedtime.     saccharomyces boulardii (FLORASTOR) 250 MG capsule Take 250 mg by mouth daily.     telmisartan (MICARDIS) 20 MG tablet Take 2 tablets by mouth daily. (40 mg total)     acetaminophen (TYLENOL) 650 MG CR tablet Take 1,300 mg by mouth every 8 (eight) hours as needed for pain.     cyclobenzaprine (FLEXERIL) 10 MG tablet TAKE 1/2 TO 1 TABLET (5-10 MG TOTAL) BY MOUTH EVERY DAY AT BEDTIME 45 tablet 0   OVER THE COUNTER MEDICATION Apply 1 application  topically daily as needed (Pain). Coka Y Marijuana     Zinc 50 MG TABS Take 50 mg by mouth every other day. At bedtime     No current facility-administered medications for this encounter.    Allergies  Allergen Reactions   Rosuvastatin     myalgias   Other Itching and Rash    Fresh green peppers    Social History   Socioeconomic History   Marital status: Married    Spouse name: Not on file   Number of children: Not on file   Years of education: Not on file   Highest education level: Not on file  Occupational History   Occupation: RN  Tobacco Use   Smoking status: Former    Types: Cigarettes, E-cigarettes    Quit date: 04/18/2012    Years since quitting: 10.1   Smokeless tobacco: Never   Tobacco comments:    Former smoker 03/26/22  Vaping Use   Vaping Use: Never used  Substance and Sexual Activity   Alcohol use: Yes    Comment: 3-4  glasses wine weekly 03/26/22   Drug use: No   Sexual activity: Not on file  Other Topics Concern   Not on file  Social History Narrative   Married, Charity fundraiser   Former tobacco smoker now Librarian, academic   + EtOH and caffeine   No drugs   Social Determinants of Corporate investment banker Strain: Not on file  Food Insecurity: Not on file  Transportation Needs: Not on file  Physical Activity: Not on file  Stress: Not on file  Social Connections: Not on file  Intimate Partner Violence: Not on file  ROS- All systems are reviewed and negative except as per the HPI above.  Physical Exam: Vitals:   06/03/22 1042  BP: 118/86  Pulse: 79  Weight: 84.9 kg  Height: 5\' 8"  (1.727 m)     GEN- The patient is a well appearing female, alert and oriented x 3 today.   HEENT-head normocephalic, atraumatic, sclera clear, conjunctiva pink, hearing intact, trachea midline. Lungs- Clear to ausculation bilaterally, normal work of breathing Heart- Regular rate and rhythm, no murmurs, rubs or gallops  GI- soft, NT, ND, + BS Extremities- no clubbing, cyanosis, or edema MS- no significant deformity or atrophy Skin- no rash or lesion Psych- euthymic mood, full affect Neuro- strength and sensation are intact   Wt Readings from Last 3 Encounters:  06/03/22 84.9 kg  05/19/22 87.4 kg  05/06/22 87.5 kg    EKG today demonstrates  SR, 1st degree AV block Vent. rate 79 BPM PR interval 236 ms QRS duration 90 ms QT/QTcB 412/472 ms   Echo 01/01/22 demonstrated   1. Left ventricular ejection fraction, by estimation, is 55 to 60%. The  left ventricle has normal function. The left ventricle has no regional  wall motion abnormalities. Left ventricular diastolic parameters are  consistent with Grade I diastolic dysfunction (impaired relaxation). The average left ventricular global longitudinal strain is -18.8 %. The global longitudinal strain is normal.   2. Right ventricular systolic function is normal. The  right ventricular  size is normal. There is normal pulmonary artery systolic pressure. The  estimated right ventricular systolic pressure is 23.8 mmHg.   3. The mitral valve is normal in structure. No evidence of mitral valve  regurgitation. No evidence of mitral stenosis.   4. The aortic valve is tricuspid. Aortic valve regurgitation is trivial.  No aortic stenosis is present.   5. There is mild dilatation of the ascending aorta, measuring 45 mm.   6. The inferior vena cava is normal in size with greater than 50%  respiratory variability, suggesting right atrial pressure of 3 mmHg.   Comparison(s): A prior study was performed on 03/12/2020. No significant change from prior study. Very frequent PVCs are seen during the current study. The ascending aorta is dilated (review of images shows a maximum diameter of 4.6 cm in 2022, 4.5 cm on current study).   Epic records are reviewed at length today  CHA2DS2-VASc Score = 4  The patient's score is based upon: CHF History: 0 HTN History: 1 Diabetes History: 0 Stroke History: 0 Vascular Disease History: 1 Age Score: 1 Gender Score: 1       ASSESSMENT AND PLAN: 1. Persistent Atrial Fibrillation (ICD10:  I48.19) The patient's CHA2DS2-VASc score is 4, indicating a 4.8% annual risk of stroke.   S/p afib ablation 05/06/22, off amiodarone. Patient in SR today but has had some breakthrough afib, reassured that this is not uncommon for the first 3 months post ablation.  Will avoid scheduled rate control with her history of prolonged PR and Mobitz I AV block. Continue diltiazem 30 mg PRN q 4 hours for heart racing.  Continue Eliquis 5 mg BID with no missed doses for 3 months post ablation.   2. Secondary Hypercoagulable State (ICD10:  D68.69) The patient is at significant risk for stroke/thromboembolism based upon her CHA2DS2-VASc Score of 4.  Continue Apixaban (Eliquis).   3. HTN Stable, no changes today.  4. CAD CAC score 373 On ezetimibe,  intolerant of statins No anginal symptoms. Cardiac PET scan pending scheduling.   5.  Fatigue/nausea Nonspecific symptoms, unclear etiology. No fever, chills, vomiting, or altered bowel habits. No swallowing pain or bleeding issues.  Will check bmet/cbc. Recent TSH normal. Not sure if it would be related to ablation this far out, will d/w Dr [name].   Follow up with Dr [name] as scheduled.    Jorja Loa PA-C Afib Clinic [facility] [address] [place], [place] [address] [phone] 06/03/2022 11:14 AM

## 2022-06-03 NOTE — Telephone Encounter (Signed)
PA for Repatha submitted  Key: [facility]

## 2022-06-03 NOTE — Assessment & Plan Note (Signed)
Assessment: LDL-C is above goal of <70 (ideally <55) Patient currently on ezetimibe 10mg  daily Did not tolerate rosuvastatin 5mg  daily We dicussed the 5 modifiable risk factors for ASCVD Currently activity level is down due to fatigue with afib. Hoping to increase activity Encouraged diet high in fiber, low in saturated fat- engaged conversation was had  Plan: Will submit PA for Repatha Will apply for healthwell grant once approved Repeat labs in 2-3 months

## 2022-06-04 ENCOUNTER — Ambulatory Visit: Payer: PPO

## 2022-06-05 NOTE — Telephone Encounter (Signed)
PA approved through 12/04/22  CARD NO. [id number]   CARD STATUS Active   BIN [id number]   PCN [id number]   PC GROUP [id number]

## 2022-06-06 ENCOUNTER — Encounter: Payer: Self-pay | Admitting: Pharmacist

## 2022-06-06 MED ORDER — REPATHA SURECLICK 140 MG/ML ~~LOC~~ SOAJ: 1.0000 mL | SUBCUTANEOUS | 11 refills | Status: DC

## 2022-06-06 MED ORDER — REPATHA SURECLICK 140 MG/ML ~~LOC~~ SOAJ: 1.0000 mL | SUBCUTANEOUS | 3 refills | Status: DC

## 2022-06-06 NOTE — Telephone Encounter (Signed)
Pt also called. Info left of [facility]

## 2022-06-06 NOTE — Telephone Encounter (Signed)
Spoke with patient. She is aware of the approval. [facility]. [employer] grant sent to [employer]. Pt has physically with PCP in July. Lipid labs most likely will be done.

## 2022-06-18 ENCOUNTER — Ambulatory Visit: Payer: PPO | Admitting: Cardiovascular Disease

## 2022-06-26 ENCOUNTER — Encounter: Payer: Self-pay | Admitting: Nurse Practitioner

## 2022-06-26 ENCOUNTER — Other Ambulatory Visit: Payer: Self-pay | Admitting: Nurse Practitioner

## 2022-06-26 ENCOUNTER — Other Ambulatory Visit: Payer: Self-pay | Admitting: Cardiology

## 2022-06-26 DIAGNOSIS — F411 Generalized anxiety disorder: Secondary | ICD-10-CM

## 2022-06-26 DIAGNOSIS — M25511 Pain in right shoulder: Secondary | ICD-10-CM

## 2022-06-26 MED ORDER — CYCLOBENZAPRINE HCL 10 MG PO TABS
10.0000 mg | ORAL_TABLET | Freq: Three times a day (TID) | ORAL | 0 refills | Status: DC | PRN
Start: 2022-06-26 — End: 2022-09-01

## 2022-07-01 ENCOUNTER — Other Ambulatory Visit: Payer: Self-pay

## 2022-07-01 DIAGNOSIS — Z79899 Other long term (current) drug therapy: Secondary | ICD-10-CM

## 2022-07-01 DIAGNOSIS — I10 Essential (primary) hypertension: Secondary | ICD-10-CM

## 2022-07-01 MED ORDER — TELMISARTAN 40 MG PO TABS
40.0000 mg | ORAL_TABLET | Freq: Every day | ORAL | 3 refills | Status: DC
Start: 2022-07-01 — End: 2023-06-16

## 2022-07-01 NOTE — Telephone Encounter (Signed)
Last few readings are high. Question if these are on the lower dose of Telmisartan (or off of it). Some readings look good. Some were high and the repeat was good.  PLAN: -Refill Telmisartan 40 mg once daily -Send BP readings in 1-2 weeks.  Tereso Newcomer, PA-C    07/01/2022 4:48 PM

## 2022-07-02 NOTE — Telephone Encounter (Signed)
Can you check with the patient to confirm the dose she is taking? She is supposed to be on Telmisartan 40 mg once daily. She is also mentioning 20 mg.  She lists several BPs that are optimal and several that are too high. BPs with me recently and the AFib clinic were normal. Can we bring her in this week for a nurse visit and BP check with her machine to confirm that it is correct before I add another drug? I don't want to make her hypotensive if her BP machine is reading too high. Also please remind her to check BP after resting for 20 min, feet on the floor, arm at heart level, no caffeine before checking. Tereso Newcomer, PA-C    07/02/2022 7:42 AM

## 2022-07-02 NOTE — Telephone Encounter (Signed)
Called and spoke with patient who confirmed that she is taking Telmisartan 40mg  daily. She has been until now, taking 20mg  QAM and 20mg  QPM. She attests to not resting prior to checking her BP and admits to dietary indiscretions to account for some of the higher readings. She was able to verify the reading on her home BP kit for the dates of her last OV with [place] and A-fib clinic and her readings are only 2 points off systolic from what's documented in the chart, so I do feel her BP kit is accurate. Telmisartan 40mg  tablet already sent by SW to pharmacy yesterday-did stress to patient that this was 40mg  in one pill and to make sure she wasn't taking an additional in the evenings any longer. She verbalized understanding. Agrees to do better on her BP recording technique and will send readings to [place] in one week via MyChart.

## 2022-07-21 ENCOUNTER — Other Ambulatory Visit: Payer: Self-pay | Admitting: Nurse Practitioner

## 2022-07-21 DIAGNOSIS — F411 Generalized anxiety disorder: Secondary | ICD-10-CM

## 2022-08-03 ENCOUNTER — Encounter: Payer: Self-pay | Admitting: Internal Medicine

## 2022-08-03 NOTE — Progress Notes (Unsigned)
Comprehensive Evaluation &  Examination   Future Appointments  Date Time Provider Department  08/04/2022                                 cpe                2:00 PM [name], MD [facility]  08/12/2022  2:15 PM [name], MD [facility]  03/23/2023                                 wellness  4:00 PM Adela Glimpse, NP [facility]  08/13/2023                                 cpe  2:00 PM [name], MD [facility]        This very nice 72 y.o. MWF  with  HTN, pAfib, HLD, Prediabetes  and Vitamin D Deficiency presents for a Screening /Preventative Visit & comprehensive evaluation and management of multiple medical co-morbidities.  Patient  is also c/o pain Lt shoulder with ROM.        HTN predates circa 2008.  Patient has remote hx /o CV x 2 for pAfib in 2002 & 2004.   Patient is s/p afib ablation with Dr [name] on 05/06/22.  Patient is on Eliquis for a CHADS2VASC score of 4. Currently she's followed by Dr [name] with a negative Stress Myoview in 2009.  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  Patient's BP has been controlled at home and patient denies any cardiac symptoms as chest pain, palpitations, shortness of breath, dizziness or ankle swelling.  BP  has been controlled at home and patient denies any cardiac symptoms as chest pain, palpitations, shortness of breath, dizziness or ankle swelling. Today's BP is at goal - 118/82.        Patient's hyperlipidemia is controlled on Repatha  & last lipids were  at goal :  Lab Results  Component Value Date   CHOL 210 (H) 02/03/2022   HDL 102 02/03/2022   LDLCALC 90 02/03/2022   TRIG 90 02/03/2022   CHOLHDL 2.1 02/03/2022         Patient has hx/o prediabetes predating A1c 5.8% /2011 &  5.9% /2011) and patient denies reactive hypoglycemic symptoms, visual blurring, diabetic polys or paresthesias. Last A1c was not at goal :  Lab Results  Component Value Date   HGBA1C  6.0 (H) 02/03/2022         Finally, patient has history of Vitamin D Deficiency ("36 " /2010) and last Vitamin D was  not  at goal  ( 70-100 ) :  Lab Results  Component Value Date   VD25OH 44 02/03/2022       Current Outpatient Medications  Medication Instructions   acetaminophen (TYLENOL) 1,300 mg, Oral, Every 8 hours PRN   ALPRAZolam (XANAX) 1 MG tablet TAKE 1/2-1 TABLET BY MOUTH AT BEDTIME   apixaban (ELIQUIS) 5 mg, Oral, 2 times daily   cyclobenzaprine (FLEXERIL) 10 mg, Oral, 3 times daily PRN   Dialyvite Vitamin D 5000 10,000 Units, Oral, Daily at bedtime   diltiazem (CARDIZEM) 30 MG tablet Take 1 tablet every 4 hours AS NEEDED for AFIB heart rate >100 as long  as top BP >100.   ezetimibe (ZETIA) 10 mg, Oral, Daily   Magnesium 500-1,000 mg, Oral, Daily at bedtime   OVER THE COUNTER MEDICATION 1 application , Topical, Daily PRN, Coka Y Marijuana   Repatha SureClick 140 mg, Subcutaneous, Every 14 days   saccharomyces boulardii (FLORASTOR) 250 mg, Oral, Daily   telmisartan (MICARDIS) 40 mg, Oral, Daily   vitamin C 1,000 mg, Oral, Daily at bedtime   Zinc 50 mg, Oral, Every other day, At bedtime     Allergies  Allergen Reactions   Rosuvastatin     myalgias     Past Medical History:  Diagnosis Date   A-fib (HCC)    Arthritis    C. difficile diarrhea    Hyperlipidemia    Hypertension    Personal history of colonic polyp- adenoma 09/22/2013   Pre-diabetes    Vitamin D deficiency      Health Maintenance  Topic Date Due   Zoster Vaccines- Shingrix (1 of 2) Never done   MAMMOGRAM  04/16/2015   INFLUENZA VACCINE  08/13/2020   COVID-19 Vaccine (5 - Booster for Pfizer series) 09/15/2020   TETANUS/TDAP  10/17/2025   DEXA SCAN  Completed   Hepatitis C Screening  Completed   PNA vac Low Risk Adult  Completed   HPV VACCINES  Aged Out     Immunization History  Administered Date(s) Administered   DTaP 01/13/2001   Influenza Inj Quad  09/30/2016   Influenza Whole  10/06/2012   Influenza, High Dose  10/15/2017, 10/24/2019   Influenza, Quadr 10/15/2018   Influenza,  quad 10/18/2015   Influenza 11/11/2013, 11/15/2014   PFIZER Covid-19 Vacc 05/15/2020   PFIZER-SARS-COV-2 Vacc 02/25/2019, 03/22/2019, 10/24/2019   PPD Test 04/16/2015, 05/15/2016   Pneumococcal -13 06/15/2017   Pneumococcal -23 01/13/2001, 02/01/2019   Tdap 10/18/2015   Zoster, Live 11/19/2009     Last Colon - 09/16/2013 - Dr Leone Payor - polyps - Recommended repeat Colon - due 2025.    Last MGM - 04/15/2013 [employer] - Implants -  s/p bilat subcut resections     Past Surgical History:  Procedure Laterality Date   APPENDECTOMY     BREAST SURGERY Bilateral 1983   SQ mastectomies   CARDIOVERSION     CHOLECYSTECTOMY     COLONOSCOPY     LAMINECTOMY  1982   L4-L5   NASAL SEPTUM SURGERY     TONSILLECTOMY     VAGINAL HYSTERECTOMY  1985    Social History   Tobacco Use   Smoking status: Former    Types: Cigarettes    Quit date: 04/18/2012    Years since quitting: 8.2   Smokeless tobacco: Former   Tobacco comments:    e-cigarettes  Substance Use Topics   Alcohol use: Not Currently   Drug use: No      ROS Constitutional: Denies fever, chills, weight loss/gain, headaches, insomnia,  night sweats, and change in appetite. Does c/o fatigue. Eyes: Denies redness, blurred vision, diplopia, discharge, itchy, watery eyes.  ENT: Denies discharge, congestion, post nasal drip, epistaxis, sore throat, earache, hearing loss, dental pain, Tinnitus, Vertigo, Sinus pain, snoring.  Cardio: Denies chest pain, palpitations, irregular heartbeat, syncope, dyspnea, diaphoresis, orthopnea, PND, claudication, edema Respiratory: denies cough, dyspnea, DOE, pleurisy, hoarseness, laryngitis, wheezing.  Gastrointestinal: Denies dysphagia, heartburn, reflux, water brash, pain, cramps, nausea, vomiting, bloating, diarrhea, constipation, hematemesis, melena, hematochezia, jaundice, hemorrhoids Genitourinary:  Denies dysuria, frequency, urgency, nocturia, hesitancy, discharge, hematuria, flank pain Breast: Breast lumps, nipple discharge, bleeding.  Musculoskeletal:  Denies arthralgia, myalgia, stiffness, Jt. Swelling, pain, limp, and strain/sprain. Denies falls. Skin: Denies puritis, rash, hives, warts, acne, eczema, changing in skin lesion Neuro: No weakness, tremor, incoordination, spasms, paresthesia, pain Psychiatric: Denies confusion, memory loss, sensory loss. Denies Depression. Endocrine: Denies change in weight, skin, hair change, nocturia, and paresthesia, diabetic polys, visual blurring, hyper / hypo glycemic episodes.  Heme/Lymph: No excessive bleeding, bruising, enlarged lymph nodes.  Physical Exam  BP 118/82   Pulse 83   Temp (!) 97.5 F (36.4 C)   Resp 16   Ht 5\' 8"  (1.727 m)   Wt 187 lb 12.8 oz (85.2 kg)   SpO2 95%   BMI 28.55 kg/m   General Appearance: Well nourished, well groomed and in no apparent distress.  Eyes: PERRLA, EOMs, conjunctiva no swelling or erythema, normal fundi and vessels. Sinuses: No frontal/maxillary tenderness ENT/Mouth: EACs patent / TMs  nl. Nares clear without erythema, swelling, mucoid exudates. Oral hygiene is good. No erythema, swelling, or exudate. Tongue normal, non-obstructing. Tonsils not swollen or erythematous. Hearing normal.  Neck: Supple, thyroid not palpable. No bruits, nodes or JVD. Respiratory: Respiratory effort normal.  BS equal and clear bilateral without rales, rhonci, wheezing or stridor. Cardio: Heart sounds are normal with regular rate and rhythm and no murmurs, rubs or gallops. Peripheral pulses are normal and equal bilaterally without edema. No aortic or femoral bruits. Chest: symmetric with normal excursions and percussion. Breasts: Deferred   Abdomen: Flat, soft with bowel sounds active. Nontender, no guarding, rebound, hernias, masses, or organomegaly.  Musculoskeletal:  Decreased abduction & int / external rotation of the  Lt shoulder due to pain with global tenderness Normal gait. Skin: Warm and dry without rashes, lesions, cyanosis, clubbing or  ecchymosis.  Neuro: Cranial nerves intact, reflexes equal bilaterally. Normal muscle tone, no cerebellar symptoms. Sensation intact.  Pysch: Alert and oriented X 3, normal affect, Insight and Judgment appropriate.   Procedure  ( CPT -  [id number]  )  After informed consent and aseptic prep with alcohol The Left deltoid  tender trigger point was injected with 1 ml ( 10 mg)  of  Decadron & 1 .0 ml of Marcaine 0.5%. Sterile bandaid was applied.     Assessment and Plan  1. Essential hypertension  - EKG 12-Lead - Urinalysis, Routine w reflex microscopic - Microalbumin / creatinine urine ratio - CBC with Differential/Platelet - COMPLETE METABOLIC PANEL WITH GFR - Magnesium - TSH  2. Hyperlipidemia, mixed  - EKG 12-Lead - Lipid panel - TSH  3. Abnormal glucose  - EKG 12-Lead - Hemoglobin A1c - Insulin, random  4. Vitamin D deficiency  - VITAMIN D 25 Hydroxy   5. Thoracic aortic aneurysm (HCC)  - Lipid panel  6. Paroxysmal atrial fibrillation (HCC)  - TSH  7. Screening for colorectal cancer  - POC Hemoccult Bld/Stl   8. Screening for heart disease  - EKG 12-Lead  9. FHx: hypertension  - EKG 12-Lead  10. Former smoker  - EKG 12-Lead  11. Fatigue, unspecified type   12. Medication management  - Urinalysis, Routine w reflex microscopic - Microalbumin / creatinine urine ratio - CBC with Differential/Platelet - COMPLETE METABOLIC PANEL WITH GFR - Magnesium - Lipid panel - TSH - Hemoglobin A1c - Insulin, random - VITAMIN D 25 Hydroxy           Patient was counseled in prudent diet to achieve/maintain BMI less than 25 for weight control, BP monitoring, regular exercise and medications. Discussed med's effects and  SE's. Screening labs and tests as requested with regular follow-up as recommended. Over 40 minutes of exam, counseling,  chart review and high complex critical decision making was performed.   Marinus Maw, MD

## 2022-08-03 NOTE — Patient Instructions (Signed)

## 2022-08-04 ENCOUNTER — Ambulatory Visit (INDEPENDENT_AMBULATORY_CARE_PROVIDER_SITE_OTHER): Payer: PPO | Admitting: Internal Medicine

## 2022-08-04 ENCOUNTER — Encounter: Payer: Self-pay | Admitting: Internal Medicine

## 2022-08-04 VITALS — BP 118/82 | HR 83 | Temp 97.5°F | Resp 16 | Ht 68.0 in | Wt 187.8 lb

## 2022-08-04 DIAGNOSIS — Z79899 Other long term (current) drug therapy: Secondary | ICD-10-CM | POA: Diagnosis not present

## 2022-08-04 DIAGNOSIS — I10 Essential (primary) hypertension: Secondary | ICD-10-CM

## 2022-08-04 DIAGNOSIS — I712 Thoracic aortic aneurysm, without rupture, unspecified: Secondary | ICD-10-CM

## 2022-08-04 DIAGNOSIS — R5383 Other fatigue: Secondary | ICD-10-CM

## 2022-08-04 DIAGNOSIS — Z0001 Encounter for general adult medical examination with abnormal findings: Secondary | ICD-10-CM | POA: Diagnosis not present

## 2022-08-04 DIAGNOSIS — E559 Vitamin D deficiency, unspecified: Secondary | ICD-10-CM

## 2022-08-04 DIAGNOSIS — I480 Paroxysmal atrial fibrillation: Secondary | ICD-10-CM

## 2022-08-04 DIAGNOSIS — Z136 Encounter for screening for cardiovascular disorders: Secondary | ICD-10-CM

## 2022-08-04 DIAGNOSIS — Z87891 Personal history of nicotine dependence: Secondary | ICD-10-CM

## 2022-08-04 DIAGNOSIS — F5101 Primary insomnia: Secondary | ICD-10-CM

## 2022-08-04 DIAGNOSIS — M25512 Pain in left shoulder: Secondary | ICD-10-CM | POA: Diagnosis not present

## 2022-08-04 DIAGNOSIS — Z1211 Encounter for screening for malignant neoplasm of colon: Secondary | ICD-10-CM

## 2022-08-04 DIAGNOSIS — R7309 Other abnormal glucose: Secondary | ICD-10-CM | POA: Diagnosis not present

## 2022-08-04 DIAGNOSIS — E782 Mixed hyperlipidemia: Secondary | ICD-10-CM

## 2022-08-04 DIAGNOSIS — R6889 Other general symptoms and signs: Secondary | ICD-10-CM | POA: Diagnosis not present

## 2022-08-04 LAB — CBC WITH DIFFERENTIAL/PLATELET
Basophils Absolute: 21 cells/uL (ref 0–200)
MCV: 89.5 fL (ref 80.0–100.0)
Monocytes Relative: 7.2 %
Neutrophils Relative %: 58.9 %
Platelets: 274 10*3/uL (ref 140–400)
Total Lymphocyte: 32.6 %
WBC: 6.9 10*3/uL (ref 3.8–10.8)

## 2022-08-04 MED ORDER — TRAZODONE HCL 150 MG PO TABS
ORAL_TABLET | ORAL | 3 refills | Status: DC
Start: 2022-08-04 — End: 2022-08-29

## 2022-08-04 NOTE — Addendum Note (Signed)
Addended by: [name] on: 08/04/2022 10:13 PM   Modules accepted: Orders

## 2022-08-05 ENCOUNTER — Other Ambulatory Visit: Payer: Self-pay | Admitting: Internal Medicine

## 2022-08-05 ENCOUNTER — Encounter: Payer: Self-pay | Admitting: Internal Medicine

## 2022-08-05 DIAGNOSIS — E782 Mixed hyperlipidemia: Secondary | ICD-10-CM

## 2022-08-05 LAB — COMPLETE METABOLIC PANEL WITH GFR
AG Ratio: 1.7 (calc) (ref 1.0–2.5)
Albumin: 4.7 g/dL (ref 3.6–5.1)
Alkaline phosphatase (APISO): 73 U/L (ref 37–153)
Calcium: 9.8 mg/dL (ref 8.6–10.4)
Chloride: 104 mmol/L (ref 98–110)
Creat: 0.7 mg/dL (ref 0.60–1.00)
Potassium: 4 mmol/L (ref 3.5–5.3)

## 2022-08-05 LAB — URINALYSIS, ROUTINE W REFLEX MICROSCOPIC
Bilirubin Urine: NEGATIVE
Glucose, UA: NEGATIVE
Hgb urine dipstick: NEGATIVE
Leukocytes,Ua: NEGATIVE
pH: 6 (ref 5.0–8.0)

## 2022-08-05 LAB — CBC WITH DIFFERENTIAL/PLATELET
Eosinophils Absolute: 69 cells/uL (ref 15–500)
Eosinophils Relative: 1 %
HCT: 43.7 % (ref 35.0–45.0)
Hemoglobin: 14.6 g/dL (ref 11.7–15.5)
Lymphs Abs: 2249 cells/uL (ref 850–3900)
RBC: 4.88 10*6/uL (ref 3.80–5.10)

## 2022-08-05 LAB — MAGNESIUM: Magnesium: 2.3 mg/dL (ref 1.5–2.5)

## 2022-08-05 LAB — MICROALBUMIN / CREATININE URINE RATIO: Creatinine, Urine: 35 mg/dL (ref 20–275)

## 2022-08-05 NOTE — Progress Notes (Signed)
^<^<^<^<^<^<^<^<^<^<^<^<^<^<^<^<^<^<^<^<^<^<^<^<^<^<^<^<^<^<^<^<^<^<^<^<^ ^>^>^>^>^>^>^>^>^>^>^>>^>^>^>^>^>^>^>^>^>^>^>^>^>^>^>^>^>^>^>^>^>^>^>^>^>  -  Test results slightly outside the reference range are not unusual. If there is anything important, I will review this with you,  otherwise it is considered normal test values.  If you have further questions,  please do not hesitate to contact me at the office or via My Chart.   ^<^<^<^<^<^<^<^<^<^<^<^<^<^<^<^<^<^<^<^<^<^<^<^<^<^<^<^<^<^<^<^<^<^<^<^<^ ^>^>^>^>^>^>^>^>^>^>^>^>^>^>^>^>^>^>^>^>^>^>^>^>^>^>^>^>^>^>^>^>^>^>^>^>^  - A1c = 5.8% - Better, but still Borderline, So . . . Marland Kitchen   - Avoid Sweets, Candy & White Stuff   - White Rice, White Big Timber, [place] Flour  - Breads &  Pasta  ^<^<^<^<^<^<^<^<^<^<^<^<^<^<^<^<^<^<^<^<^<^<^<^<^<^<^<^<^<^<^<^<^<^<^<^<^ ^>^>^>^>^>^>^>^>^>^>^>^>^>^>^>^>^>^>^>^>^>^>^>^>^>^>^>^>^>^>^>^>^>^>^>^>^  - Vit D  still pending   - Cardio Q panel is pending - can take up to 2 weeks to get back  - All Else - CBC - Kidneys - Electrolytes - Liver - Magnesium & Thyroid    - all  Normal / OK  ^<^<^<^<^<^<^<^<^<^<^<^<^<^<^<^<^<^<^<^<^<^<^<^<^<^<^<^<^<^<^<^<^<^<^<^<^ ^>^>^>^>^>^>^>^>^>^>^>^>^>^>^>^>^>^>^>^>^>^>^>^>^>^>^>^>^>^>^>^>^>^>^>^>^

## 2022-08-06 ENCOUNTER — Encounter (HOSPITAL_COMMUNITY): Payer: Self-pay

## 2022-08-06 ENCOUNTER — Telehealth (HOSPITAL_COMMUNITY): Payer: Self-pay | Admitting: Emergency Medicine

## 2022-08-06 LAB — CBC WITH DIFFERENTIAL/PLATELET
Absolute Monocytes: 497 cells/uL (ref 200–950)
Basophils Relative: 0.3 %
MCH: 29.9 pg (ref 27.0–33.0)
MCHC: 33.4 g/dL (ref 32.0–36.0)
MPV: 11 fL (ref 7.5–12.5)
Neutro Abs: 4064 cells/uL (ref 1500–7800)
RDW: 12.2 % (ref 11.0–15.0)

## 2022-08-06 LAB — MICROALBUMIN / CREATININE URINE RATIO: Microalb, Ur: <0.2 mg/dL

## 2022-08-06 LAB — LIPID PANEL
Cholesterol: 141 mg/dL (ref <200)
HDL: 91 mg/dL (ref >=50)
LDL Cholesterol (Calc): 29 mg/dL (calc)
Non-HDL Cholesterol (Calc): 50 mg/dL (calc) (ref <130)
Total CHOL/HDL Ratio: 1.5 (calc) (ref <5.0)
Triglycerides: 120 mg/dL (ref <150)

## 2022-08-06 LAB — URINALYSIS, ROUTINE W REFLEX MICROSCOPIC
Ketones, ur: NEGATIVE
Nitrite: NEGATIVE
Protein, ur: NEGATIVE
Specific Gravity, Urine: 1.009 (ref 1.001–1.035)

## 2022-08-06 LAB — COMPLETE METABOLIC PANEL WITH GFR
ALT: 23 U/L (ref 6–29)
AST: 20 U/L (ref 10–35)
BUN: 18 mg/dL (ref 7–25)
CO2: 26 mmol/L (ref 20–32)
Globulin: 2.7 g/dL (calc) (ref 1.9–3.7)
Glucose, Bld: 89 mg/dL (ref 65–99)
Sodium: 140 mmol/L (ref 135–146)
Total Bilirubin: 0.7 mg/dL (ref 0.2–1.2)
Total Protein: 7.4 g/dL (ref 6.1–8.1)
eGFR: 93 mL/min/{1.73_m2} (ref >=60)

## 2022-08-06 LAB — TEST AUTHORIZATION

## 2022-08-06 LAB — TSH: TSH: 0.92 mIU/L (ref 0.40–4.50)

## 2022-08-06 LAB — VITAMIN D 25 HYDROXY (VIT D DEFICIENCY, FRACTURES): Vit D, 25-Hydroxy: 79 ng/mL (ref 30–100)

## 2022-08-06 LAB — INSULIN, RANDOM: Insulin: 4.6 u[IU]/mL

## 2022-08-06 LAB — HEMOGLOBIN A1C W/OUT EAG: Hgb A1c MFr Bld: 5.8 % of total Hgb — ABNORMAL HIGH (ref <5.7)

## 2022-08-06 NOTE — Telephone Encounter (Signed)
Reaching out to patient to offer assistance regarding upcoming cardiac imaging study; pt verbalizes understanding of appt date/time, parking situation and where to check in, pre-test NPO status and medications ordered, and verified current allergies; name and call back number provided for further questions should they arise Sara Wallace RN Navigator Cardiac Imaging [facility] [phone] office [phone] cell 

## 2022-08-07 ENCOUNTER — Ambulatory Visit: Admission: RE | Admit: 2022-08-07 | Payer: PPO | Source: Ambulatory Visit

## 2022-08-09 NOTE — Progress Notes (Signed)
^<^<^<^<^<^<^<^<^<^<^<^<^<^<^<^<^<^<^<^<^<^<^<^<^<^<^<^<^<^<^<^<^<^<^<^<^ ^>^>^>^>^>^>^>^>^>^>^>>^>^>^>^>^>^>^>^>^>^>^>^>^>^>^>^>^>^>^>^>^>^>^>^>^>  -   Lipoprotein  A ( LPA ) - finally returned & is "Super" low - Wonderful  !   ^<^<^<^<^<^<^<^<^<^<^<^<^<^<^<^<^<^<^<^<^<^<^<^<^<^<^<^<^<^<^<^<^<^<^<^<^ ^>^>^>^>^>^>^>^>^>^>^>^>^>^>^>^>^>^>^>^>^>^>^>^>^>^>^>^>^>^>^>^>^>^>^>^>^

## 2022-08-12 ENCOUNTER — Encounter: Payer: Self-pay | Admitting: Cardiology

## 2022-08-12 ENCOUNTER — Telehealth (HOSPITAL_COMMUNITY): Payer: Self-pay | Admitting: *Deleted

## 2022-08-12 ENCOUNTER — Ambulatory Visit: Payer: PPO | Attending: Cardiology | Admitting: Cardiology

## 2022-08-12 VITALS — BP 122/78 | HR 86 | Ht 68.0 in | Wt 190.8 lb

## 2022-08-12 DIAGNOSIS — D6869 Other thrombophilia: Secondary | ICD-10-CM

## 2022-08-12 DIAGNOSIS — I2510 Atherosclerotic heart disease of native coronary artery without angina pectoris: Secondary | ICD-10-CM | POA: Diagnosis not present

## 2022-08-12 DIAGNOSIS — I10 Essential (primary) hypertension: Secondary | ICD-10-CM | POA: Diagnosis not present

## 2022-08-12 DIAGNOSIS — I480 Paroxysmal atrial fibrillation: Secondary | ICD-10-CM

## 2022-08-12 NOTE — Patient Instructions (Signed)
Medication Instructions:  Your physician recommends that you continue on your current medications as directed. Please refer to the Current Medication list given to you today.  *If you need a refill on your cardiac medications before your next appointment, please call your pharmacy*   Lab Work: None ordered   Testing/Procedures: None ordered   Follow-Up: At [facility], you and your health needs are our priority.  As part of our continuing mission to provide you with exceptional heart care, we have created designated Provider Care Teams.  These Care Teams include your primary Cardiologist (physician) and Advanced Practice Providers (APPs -  Physician Assistants and Nurse Practitioners) who all work together to provide you with the care you need, when you need it.  Your next appointment:   6 month(s)  The format for your next appointment:   In Person  Provider:   You will follow up in the Atrial Fibrillation Clinic located at [facility]. Your provider will be: Clint R. Fenton, PA-C  Or  Jon Suarez, PA  Thank you for choosing [facility]!!    Sherri Price, RN [phone]          

## 2022-08-12 NOTE — Telephone Encounter (Signed)
Reaching out to patient to offer assistance regarding upcoming cardiac imaging study; pt verbalizes understanding of appt date/time, parking situation and where to check in, pre-test NPO status  and verified current allergies; name and call back number provided for further questions should they arise  Merle Prescott RN Navigator Cardiac Imaging [facility] [phone] office [phone] cell  Patient aware to avoid caffeine 12 hours prior to her cardiac PET scan. 

## 2022-08-12 NOTE — Progress Notes (Signed)
  Electrophysiology Office Note:   Date:  08/12/2022  ID:  Taylor Li, DOB 1950/01/22, MRN [id number]  Primary Cardiologist: Nanetta Batty, MD Electrophysiologist: [name], MD      History of Present Illness:   Taylor Li is a 72 y.o. female with h/o atrial fibrillation seen today for routine electrophysiology followup.  Since last being seen in our clinic the patient reports doing.  She is post ablation 05/06/2022.  Since ablation, she has had 2 short episodes of atrial fibrillation, each 1 when she was feeling angry.  She feels well currently.  She has no chest pain or shortness of breath.  She has been having some fatigue.  She has plans for cardiac PET scan tomorrow.  she denies chest pain, palpitations, dyspnea, PND, orthopnea, nausea, vomiting, dizziness, syncope, edema, weight gain, or early satiety.   Review of systems complete and found to be negative unless listed in HPI.   EP Information / Studies Reviewed:    EKG is not ordered today. EKG from 08/04/22 reviewed which showed sinus rhythm        Risk Assessment/Calculations:    CHA2DS2-VASc Score = 4   This indicates a 4.8% annual risk of stroke. The patient's score is based upon: CHF History: 0 HTN History: 1 Diabetes History: 0 Stroke History: 0 Vascular Disease History: 1 Age Score: 1 Gender Score: 1             Physical Exam:   VS:  BP 122/78 (BP Location: Left Arm, Patient Position: Sitting, Cuff Size: Large)   Pulse 86   Ht 5\' 8"  (1.727 m)   Wt 190 lb 12.8 oz (86.5 kg)   SpO2 95%   BMI 29.01 kg/m    Wt Readings from Last 3 Encounters:  08/12/22 190 lb 12.8 oz (86.5 kg)  08/04/22 187 lb 12.8 oz (85.2 kg)  06/03/22 187 lb 3.2 oz (84.9 kg)     GEN: Well nourished, well developed in no acute distress NECK: No JVD; No carotid bruits CARDIAC: Regular rate and rhythm, no murmurs, rubs, gallops RESPIRATORY:  Clear to auscultation without rales, wheezing or rhonchi  ABDOMEN: Soft,  non-tender, non-distended EXTREMITIES:  No edema; No deformity   ASSESSMENT AND PLAN:    1.  Paroxysmal atrial fibrillation: Status post ablation 05/06/2022.  She remains in sinus rhythm.  She has had a few short episodes of atrial fibrillation but nothing that is prolonged.  Currently on Eliquis and diltiazem.  No changes.  2.  Secondary hypercoagulable state: Currently on Eliquis for atrial fibrillation  3.  Hypertension: Currently well-controlled  4.  Coronary artery calcifications: Calcium score of 373.  Cardiac PET per primary cardiology  5.  Hyperlipidemia: On Repatha per pharmacy   Follow up with Afib Clinic in 6 months  Signed, Toney Difatta Jorja Loa, MD

## 2022-08-13 ENCOUNTER — Other Ambulatory Visit (HOSPITAL_COMMUNITY): Payer: PPO

## 2022-08-13 ENCOUNTER — Encounter (HOSPITAL_COMMUNITY)
Admission: RE | Admit: 2022-08-13 | Discharge: 2022-08-13 | Disposition: A | Discharge status: Home / Self Care | Payer: PPO | Source: Ambulatory Visit | Attending: Physician Assistant | Admitting: Physician Assistant

## 2022-08-13 DIAGNOSIS — I2584 Coronary atherosclerosis due to calcified coronary lesion: Secondary | ICD-10-CM | POA: Insufficient documentation

## 2022-08-13 DIAGNOSIS — R072 Precordial pain: Secondary | ICD-10-CM | POA: Insufficient documentation

## 2022-08-13 DIAGNOSIS — I2510 Atherosclerotic heart disease of native coronary artery without angina pectoris: Secondary | ICD-10-CM | POA: Diagnosis not present

## 2022-08-13 LAB — NM PET CT CARDIAC PERFUSION MULTI W/ABSOLUTE BLOODFLOW
LV dias vol: 100 mL (ref 46–106)
LV sys vol: 51 mL
MBFR: 3.33
Nuc Rest EF: 49 %
Nuc Stress EF: 37 %
Rest MBF: 0.81 ml/g/min
Rest Nuclear Isotope Dose: 22.7 mCi
ST Depression (mm): 0 mm
Stress MBF: 2.7 ml/g/min
Stress Nuclear Isotope Dose: 22.3 mCi

## 2022-08-13 MED ORDER — RUBIDIUM RB82 GENERATOR (RUBYFILL)
22.3200 | PACK | Freq: Once | INTRAVENOUS | Status: AC
  Administered 2022-08-13: 22.32 via INTRAVENOUS

## 2022-08-13 MED ORDER — REGADENOSON 0.4 MG/5ML IV SOLN
INTRAVENOUS | Status: AC
  Filled 2022-08-13: qty 5

## 2022-08-13 MED ORDER — REGADENOSON 0.4 MG/5ML IV SOLN
0.4000 mg | Freq: Once | INTRAVENOUS | Status: AC
  Administered 2022-08-13: 0.4 mg via INTRAVENOUS

## 2022-08-13 MED ORDER — RUBIDIUM RB82 GENERATOR (RUBYFILL)
22.6900 | PACK | Freq: Once | INTRAVENOUS | Status: AC
  Administered 2022-08-13: 22.69 via INTRAVENOUS

## 2022-08-14 ENCOUNTER — Telehealth: Payer: Self-pay | Admitting: *Deleted

## 2022-08-14 DIAGNOSIS — I4819 Other persistent atrial fibrillation: Secondary | ICD-10-CM

## 2022-08-14 DIAGNOSIS — R072 Precordial pain: Secondary | ICD-10-CM

## 2022-08-14 DIAGNOSIS — R9439 Abnormal result of other cardiovascular function study: Secondary | ICD-10-CM

## 2022-08-14 MED ORDER — ISOSORBIDE MONONITRATE ER 30 MG PO TB24: 15.0000 mg | ORAL_TABLET | Freq: Every day | ORAL | 3 refills | Status: DC

## 2022-08-14 NOTE — Telephone Encounter (Signed)
Pt aware of the Cardiac CT.   She will start Imdur 15 daily

## 2022-08-14 NOTE — Telephone Encounter (Signed)
-----   Message from Tereso Newcomer sent at 08/14/2022 11:11 AM EDT ----- Results sent to [name] via MyChart. See MyChart comments below. I will send a copy to [name], MD as Lorain Childes. PLAN:  -Start Imdur 15 mg once daily  -Rx for NTG 0.4 mg prn chest pain  -Arrange follow up in the next 1-2 weeks with me or Dr. [name].  Taylor Li  Your stress test is abnormal. It suggests a blockage in the bottom part of your heart. I will start you on some long acting nitroglycerin called Imdur (15 mg once daily) and arrange earlier follow up to decide on further testing. I will also review your study with Dr. [name] and Dr. [name].  Tereso Newcomer, PA-C

## 2022-08-14 NOTE — Telephone Encounter (Signed)
-----   Message from Tereso Newcomer sent at 08/14/2022  3:53 PM EDT ----- See MyChart message. Reviewed with Dr. [name]. PLAN: -Schedule CCTA as soon as possible. -Follow up after CCTA. If CT can be done before 8/6, keep 8/6 appt. Otherwise, reschedule for after CCTA. -Start Imdur -Check with RN navigator for protocol (she has 1st degree AV block and hx of Mobitz I prompting DC of beta-blocker) Tereso Newcomer, PA-C    08/14/2022 3:52 PM

## 2022-08-14 NOTE — Telephone Encounter (Signed)
See result note. Needs earlier follow up in next 1-2 weeks. Start Imdur 15 mg once daily NTG 0.4 mg prn chest pain  Echocardiogram to assess EF (low EF on stress test) Tereso Newcomer, PA-C    08/14/2022 11:16 AM

## 2022-08-14 NOTE — Telephone Encounter (Signed)
Reviewed with Dr. [name]. He recommends a Coronary CTA first. Please schedule Coronary CTA as soon as possible.  I recommend she start the Imdur.  We should have her follow up after the CT. So, if the CT cannot be done before 8/6, will need to push her appt out further.  She has a hx of 1st degree AV block. She was taken off beta-blocker in the past due to Mobitz I block in the past. I would confirm with nurse navigator what protocol we should follow with her given this hx. Tereso Newcomer, PA-C    08/14/2022 3:47 PM

## 2022-08-15 ENCOUNTER — Encounter: Payer: Self-pay | Admitting: *Deleted

## 2022-08-15 ENCOUNTER — Telehealth: Payer: Self-pay | Admitting: *Deleted

## 2022-08-15 ENCOUNTER — Encounter: Payer: Self-pay | Admitting: Internal Medicine

## 2022-08-15 MED ORDER — METOPROLOL TARTRATE 50 MG PO TABS: ORAL_TABLET | ORAL | 0 refills | Status: DC

## 2022-08-15 NOTE — Telephone Encounter (Signed)
Pt has been called and she has been explained her instructions for the Cardiac CT and is aware to take the Lopressor 50 2 hours prior to the CT.

## 2022-08-15 NOTE — Telephone Encounter (Signed)
-----   Message from Tereso Newcomer sent at 08/15/2022 10:10 AM EDT ----- Reviewed with Dr. [name].  Patient can take half the recommended dose of metoprolol tartrate by protocol for her coronary CT. Most recent HR on ECG was 76. Ok to use this to determine dose.  Tereso Newcomer, PA-C 10:08 08/15/22

## 2022-08-18 ENCOUNTER — Encounter: Payer: Self-pay | Admitting: Internal Medicine

## 2022-08-18 ENCOUNTER — Other Ambulatory Visit: Payer: Self-pay | Admitting: Internal Medicine

## 2022-08-18 DIAGNOSIS — F5101 Primary insomnia: Secondary | ICD-10-CM

## 2022-08-18 MED ORDER — GABAPENTIN 100 MG PO CAPS: ORAL_CAPSULE | ORAL | 2 refills | Status: DC

## 2022-08-19 ENCOUNTER — Ambulatory Visit: Payer: PPO | Admitting: Physician Assistant

## 2022-08-20 ENCOUNTER — Ambulatory Visit (HOSPITAL_BASED_OUTPATIENT_CLINIC_OR_DEPARTMENT_OTHER): Admission: RE | Admit: 2022-08-20 | Payer: PPO | Source: Ambulatory Visit

## 2022-08-20 ENCOUNTER — Other Ambulatory Visit: Payer: Self-pay | Admitting: Internal Medicine

## 2022-08-20 ENCOUNTER — Ambulatory Visit (HOSPITAL_COMMUNITY)
Admission: RE | Admit: 2022-08-20 | Discharge: 2022-08-20 | Disposition: A | Discharge status: Home / Self Care | Payer: PPO | Source: Ambulatory Visit | Attending: Physician Assistant | Admitting: Physician Assistant

## 2022-08-20 ENCOUNTER — Ambulatory Visit (HOSPITAL_BASED_OUTPATIENT_CLINIC_OR_DEPARTMENT_OTHER)
Admission: RE | Admit: 2022-08-20 | Discharge: 2022-08-20 | Disposition: A | Discharge status: Home / Self Care | Payer: PPO | Source: Ambulatory Visit | Attending: Physician Assistant | Admitting: Physician Assistant

## 2022-08-20 ENCOUNTER — Encounter: Payer: Self-pay | Admitting: Physician Assistant

## 2022-08-20 ENCOUNTER — Encounter: Payer: Self-pay | Admitting: Internal Medicine

## 2022-08-20 DIAGNOSIS — I493 Ventricular premature depolarization: Secondary | ICD-10-CM | POA: Insufficient documentation

## 2022-08-20 DIAGNOSIS — I712 Thoracic aortic aneurysm, without rupture, unspecified: Secondary | ICD-10-CM | POA: Diagnosis not present

## 2022-08-20 DIAGNOSIS — I2510 Atherosclerotic heart disease of native coronary artery without angina pectoris: Secondary | ICD-10-CM

## 2022-08-20 DIAGNOSIS — I4891 Unspecified atrial fibrillation: Secondary | ICD-10-CM | POA: Insufficient documentation

## 2022-08-20 DIAGNOSIS — I7121 Aneurysm of the ascending aorta, without rupture: Secondary | ICD-10-CM | POA: Insufficient documentation

## 2022-08-20 DIAGNOSIS — R931 Abnormal findings on diagnostic imaging of heart and coronary circulation: Secondary | ICD-10-CM

## 2022-08-20 DIAGNOSIS — R9439 Abnormal result of other cardiovascular function study: Secondary | ICD-10-CM | POA: Insufficient documentation

## 2022-08-20 DIAGNOSIS — E785 Hyperlipidemia, unspecified: Secondary | ICD-10-CM | POA: Diagnosis not present

## 2022-08-20 DIAGNOSIS — R072 Precordial pain: Secondary | ICD-10-CM | POA: Insufficient documentation

## 2022-08-20 DIAGNOSIS — I4819 Other persistent atrial fibrillation: Secondary | ICD-10-CM

## 2022-08-20 DIAGNOSIS — J449 Chronic obstructive pulmonary disease, unspecified: Secondary | ICD-10-CM | POA: Insufficient documentation

## 2022-08-20 DIAGNOSIS — Z87891 Personal history of nicotine dependence: Secondary | ICD-10-CM | POA: Insufficient documentation

## 2022-08-20 LAB — ECHOCARDIOGRAM COMPLETE
AR max vel: 2.13 cm2
AV Area VTI: 2.16 cm2
AV Area mean vel: 2.09 cm2
AV Mean grad: 3.5 mmHg
AV Peak grad: 5.1 mmHg
Ao pk vel: 1.13 m/s
Area-P 1/2: 3 cm2
Calc EF: 54.2 %
MV VTI: 3.06 cm2
S' Lateral: 3.2 cm
Single Plane A2C EF: 56.6 %
Single Plane A4C EF: 52 %

## 2022-08-20 MED ORDER — NITROGLYCERIN 0.4 MG SL SUBL
SUBLINGUAL_TABLET | SUBLINGUAL | Status: AC
  Filled 2022-08-20: qty 2

## 2022-08-20 MED ORDER — NITROGLYCERIN 0.4 MG SL SUBL: 0.8000 mg | SUBLINGUAL_TABLET | Freq: Once | SUBLINGUAL | Status: DC

## 2022-08-20 MED ORDER — IOHEXOL 350 MG/ML SOLN
100.0000 mL | Freq: Once | INTRAVENOUS | Status: AC | PRN
  Administered 2022-08-20: 100 mL via INTRAVENOUS

## 2022-08-20 NOTE — Progress Notes (Signed)
CT sent for FFR 

## 2022-08-25 ENCOUNTER — Other Ambulatory Visit: Payer: Self-pay | Admitting: *Deleted

## 2022-08-28 NOTE — Progress Notes (Signed)
Cardiology Office Note:    Date:  08/29/2022  ID:  Taylor Li, DOB [birth year], MRN [id number] PCP: [name], MD  [facility] Providers Cardiologist:  Nanetta Batty, MD Electrophysiologist:  [name], MD       Patient Profile:      Coronary Artery Disease Ca2+ on CT in 2019 // Nuc 02/2020: no ischemia // CT 04/2022: CAC 373 Myocardial PET 08/13/2022: Small area of moderate inferior ischemia, EF 49, stress EF 37, normal global myocardial blood flow reserve; intermediate risk (EF 50-55 on TTE) CCTA 08/20/2022: CAC score 340 (88th percentile); RCA ostial 1-24, LAD proximal 50-69, LCx proximal 1-24>> FFR normal in the LAD, LCx and RCA  Paroxysmal atrial fibrillation S/p TEE DCCV 12/2021 S/p PVI ablation 05/06/22 (Dr. [name]) Ascending thoracic aortic aneurysm CT 09/2020: 4.3 cm  TTE 03/12/2020: EF 60-65, no RWMA, mild LVH, GR 1 DD, GLS -19.5, normal RVSF, trivial MR, trivial AI, dilated aortic root (39 mm), dilated ascending aorta (39 mm), RAP 3 Pre PVI ablation CT: 41 mm TTE 08/20/2022: EF 50-55, no RWMA, mild LVH, GR 1 DD, normal RVSF AV sclerosis, ascending aorta 47 mm, RAP 3 CCTA 08/2022: Ascending aorta 4.4 cm PVCs Monitor 02/2020: 5.6% burden Mobitz Type I (Wenckebach) Noted on monitor 02/2020 Cardiac monitor 03/10/2020: Sinus rhythm, frequent PVCs (5.6% burden), short runs of NSVT; Mobitz type I Seen by EP (Dr. Johney Frame) >> beta-blocker DC'd Hypertension  Hyperlipidemia  Intol to statins Chronic Obstructive Pulmonary Disease               Taylor Li is a 72 y.o. female returns for follow-up of coronary disease.  She recently had an myocardial PET that demonstrated small area of moderate inferior ischemia.  Follow-up CT demonstrated moderate nonobstructive CAD.  FFR was normal.  Echo demonstrated low normal EF.  CT scan demonstrated stability of her thoracic aortic aneurysm at 4.4 cm.  We discussed results of her test today.  She is feeling better without  chest pain, fatigue.  She has chronic shortness of breath without change.  She is having difficulty with sleep and will continue to discuss this with her primary care provider.  ROS: See HPI    Studies Reviewed:        Risk Assessment/Calculations:    CHA2DS2-VASc Score = 4   This indicates a 4.8% annual risk of stroke. The patient's score is based upon: CHF History: 0 HTN History: 1 Diabetes History: 0 Stroke History: 0 Vascular Disease History: 1 Age Score: 1 Gender Score: 1            Physical Exam:   VS:  BP 130/80   Pulse 97   Ht 5\' 8"  (1.727 m)   Wt 191 lb 6.4 oz (86.8 kg)   SpO2 95%   BMI 29.10 kg/m    Wt Readings from Last 3 Encounters:  08/29/22 191 lb 6.4 oz (86.8 kg)  08/12/22 190 lb 12.8 oz (86.5 kg)  08/04/22 187 lb 12.8 oz (85.2 kg)    Constitutional:      Appearance: Healthy appearance. Not in distress.  Neck:     Vascular: JVD normal.  Pulmonary:     Breath sounds: Normal breath sounds. No wheezing. No rales.  Cardiovascular:     Normal rate. Regular rhythm.     Murmurs: There is no murmur.  Edema:    Peripheral edema absent.  Abdominal:     Palpations: Abdomen is soft.  Assessment and Plan:  CAD (coronary artery disease) Moderate nonobstructive CAD by coronary CTA in August 2024 she is doing well without chest symptoms to suggest angina she is not therapy as she is on Eliquis continue Repatha 140 mg every 2 weeks and Zetia 10 mg daily.  Blood pressure is well-controlled.  Cholesterol is well-controlled.  We discussed the importance of diet and exercise.  Recommend 150 minutes of aerobic activity per week.  Recommend whole foods, plant-based diet.  Follow-up 6 months.  PAF (paroxysmal atrial fibrillation) (HCC) Status post PVI ablation in 2024.  She is doing well without recurrent symptoms of atrial fibrillation.  Recent creatinine normal at 0.7.  Recent hemoglobin normal at 14.6.  She is tolerating anticoagulation.  Continue Eliquis 5  mg twice daily.  Follow-up with EP as planned.  Thoracic aortic aneurysm (HCC) 4.4 cm on recent coronary CTA.  Arrange follow-up CT in 1 year.  Hyperlipidemia LDL optimal on recent lab with primary care in July 2024 (LDL 29).  Continue Repatha 140 mg every 2 weeks, Zetia 10 mg daily.  Essential hypertension Blood pressure is controlled.  Continue telmisartan 40 mg daily. Assessment and Plan              Dispo:  Return in about 6 months (around 03/01/2023) for Routine Follow Up with Dr. [name].  Signed, Tereso Newcomer, PA-C

## 2022-08-29 ENCOUNTER — Ambulatory Visit: Payer: PPO | Attending: Physician Assistant | Admitting: Physician Assistant

## 2022-08-29 ENCOUNTER — Encounter: Payer: Self-pay | Admitting: Physician Assistant

## 2022-08-29 VITALS — BP 130/80 | HR 97 | Ht 68.0 in | Wt 191.4 lb

## 2022-08-29 DIAGNOSIS — I480 Paroxysmal atrial fibrillation: Secondary | ICD-10-CM | POA: Diagnosis not present

## 2022-08-29 DIAGNOSIS — E782 Mixed hyperlipidemia: Secondary | ICD-10-CM

## 2022-08-29 DIAGNOSIS — I10 Essential (primary) hypertension: Secondary | ICD-10-CM | POA: Diagnosis not present

## 2022-08-29 DIAGNOSIS — I7121 Aneurysm of the ascending aorta, without rupture: Secondary | ICD-10-CM

## 2022-08-29 DIAGNOSIS — I2510 Atherosclerotic heart disease of native coronary artery without angina pectoris: Secondary | ICD-10-CM | POA: Diagnosis not present

## 2022-08-29 NOTE — Assessment & Plan Note (Signed)
LDL optimal on recent lab with primary care in July 2024 (LDL 29).  Continue Repatha 140 mg every 2 weeks, Zetia 10 mg daily.

## 2022-08-29 NOTE — Patient Instructions (Signed)
Medication Instructions:  Your physician recommends that you continue on your current medications as directed. Please refer to the Current Medication list given to you today.  *If you need a refill on your cardiac medications before your next appointment, please call your pharmacy*  Lab Work: None ordered today. If you have labs (blood work) drawn today and your tests are completely normal, you will receive your results only by: MyChart Message (if you have MyChart) OR A paper copy in the mail If you have any lab test that is abnormal or we need to change your treatment, we will call you to review the results.  Testing/Procedures: Your physician has requested that you have cardiac CTA chest/aorta in August 2025 to follow-up on thoracic aortic aneurysm. Cardiac computed tomography (CT) is a painless test that uses an x-ray machine to take clear, detailed pictures of your heart. For further information please visit [email or web address] A scheduler will contact you to schedule this test.   Follow-Up: At [facility], you and your health needs are our priority.  As part of our continuing mission to provide you with exceptional heart care, we have created designated Provider Care Teams.  These Care Teams include your primary Cardiologist (physician) and Advanced Practice Providers (APPs -  Physician Assistants and Nurse Practitioners) who all work together to provide you with the care you need, when you need it.  Your next appointment:   6 month(s)  The format for your next appointment:   In Person  Provider:   Nanetta Batty, MD {

## 2022-08-29 NOTE — Assessment & Plan Note (Signed)
Blood pressure is controlled.  Continue telmisartan 40 mg daily.

## 2022-08-29 NOTE — Assessment & Plan Note (Signed)
Moderate nonobstructive CAD by coronary CTA in August 2024 she is doing well without chest symptoms to suggest angina she is not therapy as she is on Eliquis continue Repatha 140 mg every 2 weeks and Zetia 10 mg daily.  Blood pressure is well-controlled.  Cholesterol is well-controlled.  We discussed the importance of diet and exercise.  Recommend 150 minutes of aerobic activity per week.  Recommend whole foods, plant-based diet.  Follow-up 6 months.

## 2022-08-29 NOTE — Assessment & Plan Note (Signed)
Status post PVI ablation in 2024.  She is doing well without recurrent symptoms of atrial fibrillation.  Recent creatinine normal at 0.7.  Recent hemoglobin normal at 14.6.  She is tolerating anticoagulation.  Continue Eliquis 5 mg twice daily.  Follow-up with EP as planned.

## 2022-08-29 NOTE — Assessment & Plan Note (Signed)
4.4 cm on recent coronary CTA.  Arrange follow-up CT in 1 year.

## 2022-09-01 ENCOUNTER — Other Ambulatory Visit: Payer: Self-pay | Admitting: Nurse Practitioner

## 2022-09-01 DIAGNOSIS — M25511 Pain in right shoulder: Secondary | ICD-10-CM

## 2022-09-10 ENCOUNTER — Other Ambulatory Visit: Payer: Self-pay | Admitting: Nurse Practitioner

## 2022-09-10 DIAGNOSIS — F411 Generalized anxiety disorder: Secondary | ICD-10-CM

## 2022-09-17 ENCOUNTER — Other Ambulatory Visit: Payer: Self-pay | Admitting: Internal Medicine

## 2022-10-14 ENCOUNTER — Other Ambulatory Visit: Payer: Self-pay

## 2022-10-14 ENCOUNTER — Ambulatory Visit: Payer: PPO | Attending: Orthopedic Surgery | Admitting: Physical Therapy

## 2022-10-14 DIAGNOSIS — G8929 Other chronic pain: Secondary | ICD-10-CM | POA: Diagnosis not present

## 2022-10-14 DIAGNOSIS — M6281 Muscle weakness (generalized): Secondary | ICD-10-CM | POA: Diagnosis not present

## 2022-10-14 DIAGNOSIS — M25512 Pain in left shoulder: Secondary | ICD-10-CM | POA: Diagnosis not present

## 2022-10-14 DIAGNOSIS — M25511 Pain in right shoulder: Secondary | ICD-10-CM | POA: Diagnosis not present
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
## 2022-10-15 ENCOUNTER — Ambulatory Visit: Payer: PPO | Admitting: Physical Therapy

## 2022-10-21 DIAGNOSIS — M67912 Unspecified disorder of synovium and tendon, left shoulder: Secondary | ICD-10-CM | POA: Diagnosis not present

## 2022-10-30 DIAGNOSIS — M25512 Pain in left shoulder: Secondary | ICD-10-CM | POA: Diagnosis not present

## 2022-11-03 NOTE — Progress Notes (Deleted)
3 MONTH  FOLLOW UP  Assessment:   Diagnoses and all orders for this visit:  Thoracic aortic aneurysm without rupture (HCC) Control BP, followed by Dr. Dorris Fetch Monitor annually  - last 08/20/22  PVC's (premature ventricular contractions) Monitor, followed by Dr. [name]  Paroxysmal atrial fibrillation [facility]) Last documented remote; recently controlled post CV Has not been to cardiology for several years Atrial fibrillation heard on auscultation and EKG confirms Continue Eliquis 5 mg BID, Diltiazem 240 mg QD- hold aspirin Go to the ER if any chest pain, shortness of breath, nausea, dizziness, severe HA, changes vision/speech   Essential hypertension Continue medication Monitor blood pressure at home; call if consistently over 130/80 Continue DASH diet.   Reminder to go to the ER if any CP, SOB, nausea, dizziness, severe HA, changes vision/speech, left arm numbness and tingling and jaw pain. - CBC  Chronic obstructive pulmonary disease, unspecified COPD type (HCC) Asymptomatic, former smokr Continue to monitor  Vitamin D deficiency Continue supplementation Check vitamin D level annually and as needed   Overweight (BMI 25.0-29.9) Long discussion about weight loss, diet, and exercise Recommended diet heavy in fruits and veggies and low in animal meats, cheeses, and dairy products, appropriate calorie intake Discussed appropriate weight for height Follow up at next visit - TSH  Other abnormal glucose Recent A1Cs at goal Discussed diet/exercise, weight management  Defer A1C; check CMP  Medication management - TSH  Hyperlipidemia Off of medications, hx of statin myalgia with low dose low frequency rosuvastatin, at goal LDL <100 by lifestyle  She stopped her Zetia as well because she thought it was contributing to muscle pain Continue low cholesterol diet and exercise.  Check lipid panel. TSH  Anxiety and depression Well managed by current regimen;  Currently on  Celexa 40 mg daily, tried tapering and had additional stress and when back to her regular dose. Will try slow taper down alprazolam  Stress management techniques discussed, increase water, good sleep hygiene discussed, increase exercise, and increase veggies.    Overweight BMI 25-29 Discussed dietary and exercise modifications   Osteoarthritis, bilateral Continue supplements, weight loss, regular gentle exercise  Flu vaccine need High dose flu vaccine given   Over 30 minutes of face to face exam, counseling, chart review and critical decision making was performed Future Appointments  Date Time Provider Department Center  11/04/2022  3:30 PM Raynelle Dick, NP [facility] None  02/09/2023  2:30 PM [name], MD [facility] None  02/12/2023  1:30 PM Alphonzo Severance R, PA [facility] None  03/23/2023  4:00 PM Adela Glimpse, NP [facility] None  08/13/2023  2:00 PM [name], MD [facility] None        Subjective:  Taylor Li is a 72 y.o. female who presents for Medicare Annual Wellness Visit and 3 month follow up. She has Essential hypertension; Hyperlipidemia; Vitamin D deficiency; Other abnormal glucose; PAF (paroxysmal atrial fibrillation) (HCC); Medication management; History of colonic polyps; Anxiety and depression; COPD (chronic obstructive pulmonary disease) (HCC); Overweight (BMI 25.0-29.9); PVC's (premature ventricular contractions); Thoracic aortic aneurysm (HCC); Fatigue; Hypercoagulable state due to persistent atrial fibrillation (HCC); and CAD (coronary artery disease) on their problem list.   She has remote pAfib in 2002 & 2004 (Req CV), she has history of atypical CP and was evaluated by Dr. [name], Her 2D echo was entirely normal except for a dilated thoracic aorta measuring 43 mm (has seen Dr. Dorris Fetch, now annual monitoring here, last CTA 09/19/2020 showed stable at 4.3 cm).  A  Myoview stress test was normal as well.  Her event monitor at that time showed  PVCs.  She denies having any abnormal heart rhythms that she is aware of  she has a diagnosis of depression/anxiety and is currently on wellbutrin 300 mg and celexa 40 mg daily, PRN xanax, reports symptoms are fairly well controlled on current regimen. she currently takes 1 at night for sleep and this is working well, receptive to taper down after discussion of risks due to pharmacokinetics in older adults, increased risk of unintentional overdose with daily use. Last Alprazolam 1 mg #60 was filled on 10/10/21  BMI is There is no height or weight on file to calculate BMI., she has been working on diet and exercise, likes to dance.  Wt Readings from Last 3 Encounters:  08/29/22 191 lb 6.4 oz (86.8 kg)  08/12/22 190 lb 12.8 oz (86.5 kg)  08/04/22 187 lb 12.8 oz (85.2 kg)    Bp is controlled with Telmisartan 20 mg QD. Her blood pressure today their BP is    BP Readings from Last 3 Encounters:  08/29/22 130/80  08/20/22 107/71  08/13/22 107/61  She does not workout. She denies chest pain, shortness of breath, dizziness.    She is not on cholesterol medication, taking triple omega 3 supplement (ezetimibe 10 mg daily stopped 2 months ago because she was having shoulder pain.)  Was taking alirocumab via cardiology, but cannot afford, severe myalgias with statin, even with rosuvastatin 5 mg 3 days a week).   Her cholesterol is at goal. The cholesterol last visit was:   Lab Results  Component Value Date   CHOL 141 08/04/2022   HDL 91 08/04/2022   LDLCALC 29 08/04/2022   TRIG 120 08/04/2022   CHOLHDL 1.5 08/04/2022    She has not been working on diet and exercise for glucose management, and denies foot ulcerations, increased appetite, nausea, paresthesia of the feet, polydipsia, polyuria and visual disturbances. Last A1C in the office was:  Lab Results  Component Value Date   HGBA1C 5.8 (H) 08/04/2022   Last GFR: Lab Results  Component Value Date   EGFR 93 08/04/2022    Patient is newly on  Vitamin D supplement.   Lab Results  Component Value Date   VD25OH 79 08/04/2022      Medication Review: Current Outpatient Medications on File Prior to Visit  Medication Sig Dispense Refill   acetaminophen (TYLENOL) 650 MG CR tablet Take 1,300 mg by mouth every 8 (eight) hours as needed for pain.     ALPRAZolam (XANAX) 1 MG tablet TAKE 1/2 TO 1 TABLET BY MOUTH AT BEDTIME 20 tablet 0   apixaban (ELIQUIS) 5 MG TABS tablet Take 1 tablet (5 mg total) by mouth 2 (two) times daily. 180 tablet 3   Ascorbic Acid (VITAMIN C) 1000 MG tablet Take 1,000 mg by mouth at bedtime.     Cholecalciferol (DIALYVITE VITAMIN D 5000) 125 MCG (5000 UT) capsule Take 10,000 Units by mouth at bedtime.     cyclobenzaprine (FLEXERIL) 10 MG tablet Take 1 tablet (10 mg total) by mouth 2 (two) times daily as needed for muscle spasms. 60 tablet 0   diltiazem (CARDIZEM) 30 MG tablet Take 1 tablet every 4 hours AS NEEDED for AFIB heart rate >100 as long as top BP >100. 30 tablet 1   Evolocumab (REPATHA SURECLICK) 140 MG/ML SOAJ Inject 140 mg into the skin every 14 (fourteen) days. 6 mL 3   ezetimibe (ZETIA) 10 MG  tablet TAKE 1 TABLET BY MOUTH EVERY DAY FOR CHOLESTEROL 90 tablet 3   gabapentin (NEURONTIN) 100 MG capsule Take  1 to 3 capsules   1 to 2 hours   before Bedtime as needed for Sleep 90 capsule 2   Magnesium 500 MG TABS Take 500-1,000 mg by mouth at bedtime.     OVER THE COUNTER MEDICATION Apply 1 application  topically daily as needed (Pain). [place] Marijuana     saccharomyces boulardii (FLORASTOR) 250 MG capsule Take 250 mg by mouth daily.     telmisartan (MICARDIS) 40 MG tablet Take 1 tablet (40 mg total) by mouth daily. 90 tablet 3   Zinc 50 MG TABS Take 50 mg by mouth every other day. At bedtime     No current facility-administered medications on file prior to visit.    Allergies  Allergen Reactions   Rosuvastatin     myalgias   Other Itching and Rash    Fresh green peppers    Current Problems  (verified) Patient Active Problem List   Diagnosis Date Noted   CAD (coronary artery disease) 05/18/2022   Hypercoagulable state due to persistent atrial fibrillation (HCC) 03/26/2022   Fatigue 02/16/2020   PVC's (premature ventricular contractions) 09/04/2017   Thoracic aortic aneurysm (HCC) 09/04/2017   Overweight (BMI 25.0-29.9) 03/10/2017   COPD (chronic obstructive pulmonary disease) (HCC) 09/29/2016   Anxiety and depression 12/06/2013   History of colonic polyps 09/22/2013   Medication management 09/09/2013   Essential hypertension 01/14/2013   Hyperlipidemia 01/14/2013   Vitamin D deficiency 01/14/2013   Other abnormal glucose 01/14/2013   PAF (paroxysmal atrial fibrillation) (HCC) 01/14/2013     Names of Other Physician/Practitioners you currently use: 1. [facility] here for primary care 2. [facility], eye doctor, last visit 2000, Due for 2022 3. Dr, Yehuda Mao dentist, last visit 2023  Patient Care Team: [name], MD as PCP - General (Internal Medicine) Runell Gess, MD as PCP - Cardiology (Cardiology) [name], MD as PCP - Electrophysiology (Cardiology)  SURGICAL HISTORY She  has a past surgical history that includes Laminectomy (1982); Breast surgery (Bilateral, 1983); Vaginal hysterectomy (1985); Tonsillectomy; Nasal septum surgery; Cardioversion; Appendectomy; Cholecystectomy; Colonoscopy; TEE without cardioversion (N/A, 12/13/2021); Cardioversion (N/A, 12/13/2021); and ATRIAL FIBRILLATION ABLATION (N/A, 05/06/2022). FAMILY HISTORY Her family history includes Atrial fibrillation in her mother; Barrett's esophagus in her mother; Clotting disorder in her father; Hypertension in her father and mother; Lung cancer in her father; Mitral valve prolapse in her mother; Thyroid disease in her father. SOCIAL HISTORY She  reports that she quit smoking about 10 years ago. Her smoking use included cigarettes and  e-cigarettes. She has never used smokeless tobacco. She reports current alcohol use. She reports that she does not use drugs.    Review of Systems  Constitutional:  Negative for malaise/fatigue and weight loss.  HENT:  Negative for hearing loss and tinnitus.   Eyes:  Negative for blurred vision and double vision.  Respiratory:  Negative for cough, sputum production, shortness of breath and wheezing.   Cardiovascular:  Negative for chest pain, palpitations, orthopnea, claudication, leg swelling and PND.  Gastrointestinal:  Negative for abdominal pain, blood in stool, constipation, diarrhea, heartburn, melena, nausea and vomiting.  Genitourinary: Negative.   Musculoskeletal:  Positive for joint pain (Right shoulder and bicep). Negative for falls and myalgias.  Skin:  Negative for rash.  Neurological:  Negative for dizziness, tingling, sensory change, weakness and headaches.  Endo/Heme/Allergies:  Negative for polydipsia.  Psychiatric/Behavioral:  Positive for depression (in remission on meds). Negative for memory loss, substance abuse and suicidal ideas. The patient is nervous/anxious and has insomnia (controlled with xanax).   All other systems reviewed and are negative.    Objective:     There were no vitals filed for this visit.  There is no height or weight on file to calculate BMI.  General appearance: alert, no distress, WD/WN, female HEENT: normocephalic, sclerae anicteric, TMs pearly, nares patent, no discharge or erythema, pharynx normal Oral cavity: MMM, no lesions Neck: supple, no lymphadenopathy, no thyromegaly, no masses Heart: Irregularly irregular, Atrial fib on EKG Lungs: CTA bilaterally, no wheezes, rhonchi, or rales Abdomen: +bs, soft, non tender, non distended, no masses, no hepatomegaly, no splenomegaly Musculoskeletal: nontender, no swelling, DIP and PIP joint enlargment, tenderness and erythema to left 2nd digit.  Right shoulder tenderness anteriorly, decreased  ROM of right shoulder and right bicep, small mass in middle of right bicep that is very tender Pulses: 2+ symmetric, upper and lower extremities, normal cap refill Neurological: alert, oriented x 3, CN2-12 intact, strength normal upper extremities and lower extremities, sensation normal throughout, DTRs 2+ throughout, no cerebellar signs, gait normal Psychiatric: normal affect, behavior normal, pleasant     Raynelle Dick, NP   11/03/2022

## 2022-11-04 ENCOUNTER — Ambulatory Visit: Payer: PPO | Admitting: Nurse Practitioner

## 2022-11-06 ENCOUNTER — Other Ambulatory Visit: Payer: Self-pay | Admitting: Nurse Practitioner

## 2022-11-06 DIAGNOSIS — M25511 Pain in right shoulder: Secondary | ICD-10-CM

## 2022-11-11 DIAGNOSIS — M75122 Complete rotator cuff tear or rupture of left shoulder, not specified as traumatic: Secondary | ICD-10-CM | POA: Diagnosis not present

## 2022-11-12 DIAGNOSIS — M75122 Complete rotator cuff tear or rupture of left shoulder, not specified as traumatic: Secondary | ICD-10-CM | POA: Diagnosis not present

## 2022-11-17 ENCOUNTER — Encounter: Payer: Self-pay | Admitting: Internal Medicine

## 2022-11-17 ENCOUNTER — Other Ambulatory Visit: Payer: Self-pay | Admitting: Nurse Practitioner

## 2022-11-17 ENCOUNTER — Encounter: Payer: Self-pay | Admitting: Physical Therapy

## 2022-11-17 ENCOUNTER — Encounter: Payer: Self-pay | Admitting: Cardiovascular Disease

## 2022-11-17 ENCOUNTER — Other Ambulatory Visit: Payer: Self-pay | Admitting: Internal Medicine

## 2022-11-17 DIAGNOSIS — F5101 Primary insomnia: Secondary | ICD-10-CM

## 2022-11-17 DIAGNOSIS — F411 Generalized anxiety disorder: Secondary | ICD-10-CM

## 2022-11-18 ENCOUNTER — Other Ambulatory Visit: Payer: Self-pay

## 2022-11-18 ENCOUNTER — Other Ambulatory Visit: Payer: Self-pay | Admitting: Internal Medicine

## 2022-11-18 DIAGNOSIS — Z1211 Encounter for screening for malignant neoplasm of colon: Secondary | ICD-10-CM

## 2022-11-18 DIAGNOSIS — K922 Gastrointestinal hemorrhage, unspecified: Secondary | ICD-10-CM

## 2022-11-18 LAB — POC HEMOCCULT BLD/STL (HOME/3-CARD/SCREEN)
Card #2 Fecal Occult Blod, POC: POSITIVE
Card #3 Fecal Occult Blood, POC: POSITIVE
Fecal Occult Blood, POC: POSITIVE — AB

## 2022-11-18 NOTE — Progress Notes (Signed)
<>*<>*<>*<>*<>*<>*<>*<>*<>*<>*<>*<>*<>*<>*<>*<>*<>*<>*<>*<>*<>*<>*<>*<>*<> <>*<>*<>*<>*<>*<>*<>*<>*<>*<>*<>*<>*<>*<>*<>*<>*<>*<>*<>*<>*<>*<>*<>*<>*<>    Okey Regal ,  - Your Hemoccult returned (+) Positive x 3 ,   So I have requested office consultation with Dr Leone Payor.    - When you see Annabelle Harman, NP       next week on Mon the 11th,   she will recheck your blood counts  - bill mck   <>*<>*<>*<>*<>*<>*<>*<>*<>*<>*<>*<>*<>*<>*<>*<>*<>*<>*<>*<>*<>*<>*<>*<>*<> <>*<>*<>*<>*<>*<>*<>*<>*<>*<>*<>*<>*<>*<>*<>*<>*<>*<>*<>*<>*<>*<>*<>*<>*<>

## 2022-11-19 ENCOUNTER — Telehealth: Payer: Self-pay | Admitting: Cardiovascular Disease

## 2022-11-19 DIAGNOSIS — Z1212 Encounter for screening for malignant neoplasm of rectum: Secondary | ICD-10-CM | POA: Diagnosis not present

## 2022-11-19 DIAGNOSIS — Z1211 Encounter for screening for malignant neoplasm of colon: Secondary | ICD-10-CM | POA: Diagnosis not present

## 2022-11-19 NOTE — Telephone Encounter (Signed)
Pharmacy please advise on holding Eliquis prior to left total shoulder replacement scheduled for TBD. Thank you.

## 2022-11-19 NOTE — Telephone Encounter (Signed)
   Pre-operative Risk Assessment    Patient Name: Taylor Li  DOB: 17-Mar-1950 MRN: [id number]      Request for Surgical Clearance    Procedure:   Left Reverse Total Shoulder Replacement   Date of Surgery:  Clearance TBD                                 Surgeon: Dr. Ave Filter Surgeon's Group or Practice Name: [facility] Phone number: [phone] Fax number: [phone]   Type of Clearance Requested:   - Medical  - Pharmacy:  Hold Apixaban (Eliquis)     Type of Anesthesia:   Choice   Additional requests/questions:  Please advise surgeon/provider what medications should be held.  Barbette Reichmann   11/19/2022, 1:59 PM

## 2022-11-20 ENCOUNTER — Telehealth: Payer: Self-pay

## 2022-11-20 NOTE — Telephone Encounter (Signed)
  Patient Consent for Virtual Visit         Taylor Li has provided verbal consent on 11/20/2022 for a virtual visit (video or telephone).   CONSENT FOR VIRTUAL VISIT FOR:  Taylor Li  By participating in this virtual visit I agree to the following:  I hereby voluntarily request, consent and authorize [facility] and its employed or contracted physicians, physician assistants, nurse practitioners or other licensed health care professionals (the Practitioner), to provide me with telemedicine health care services (the "Services") as deemed necessary by the treating Practitioner. I acknowledge and consent to receive the Services by the Practitioner via telemedicine. I understand that the telemedicine visit will involve communicating with the Practitioner through live audiovisual communication technology and the disclosure of certain medical information by electronic transmission. I acknowledge that I have been given the opportunity to request an in-person assessment or other available alternative prior to the telemedicine visit and am voluntarily participating in the telemedicine visit.  I understand that I have the right to withhold or withdraw my consent to the use of telemedicine in the course of my care at any time, without affecting my right to future care or treatment, and that the Practitioner or I may terminate the telemedicine visit at any time. I understand that I have the right to inspect all information obtained and/or recorded in the course of the telemedicine visit and may receive copies of available information for a reasonable fee.  I understand that some of the potential risks of receiving the Services via telemedicine include:  Delay or interruption in medical evaluation due to technological equipment failure or disruption; Information transmitted may not be sufficient (e.g. poor resolution of images) to allow for appropriate medical decision making by the  Practitioner; and/or  In rare instances, security protocols could fail, causing a breach of personal health information.  Furthermore, I acknowledge that it is my responsibility to provide information about my medical history, conditions and care that is complete and accurate to the best of my ability. I acknowledge that Practitioner's advice, recommendations, and/or decision may be based on factors not within their control, such as incomplete or inaccurate data provided by me or distortions of diagnostic images or specimens that may result from electronic transmissions. I understand that the practice of medicine is not an exact science and that Practitioner makes no warranties or guarantees regarding treatment outcomes. I acknowledge that a copy of this consent can be made available to me via my patient portal [facility] MyChart), or I can request a printed copy by calling the office of  [facility].    I understand that my insurance will be billed for this visit.   I have read or had this consent read to me. I understand the contents of this consent, which adequately explains the benefits and risks of the Services being provided via telemedicine.  I have been provided ample opportunity to ask questions regarding this consent and the Services and have had my questions answered to my satisfaction. I give my informed consent for the services to be provided through the use of telemedicine in my medical care

## 2022-11-20 NOTE — Telephone Encounter (Signed)
   Name: Taylor Li  DOB: 1950/02/25  MRN: [id number]  Primary Cardiologist: Nanetta Batty, MD   Preoperative team, please contact this patient and set up a phone call appointment for further preoperative risk assessment. Please obtain consent and complete medication review. Thank you for your help.  I confirm that guidance regarding antiplatelet and oral anticoagulation therapy has been completed and, if necessary, noted below.  Per office protocol, patient can hold Eliquis for 3 days prior to procedure.      I also confirmed the patient resides in the state of [place]. As per [facility] Medical Board telemedicine laws, the patient must reside in the state in which the provider is licensed.   Napoleon Form, Leodis Rains, NP 11/20/2022, 12:52 PM [facility]

## 2022-11-20 NOTE — Progress Notes (Signed)
3 MONTH  FOLLOW UP  Assessment:   Diagnoses and all orders for this visit:  Thoracic aortic aneurysm without rupture (HCC) Control BP, followed by Dr. Dorris Fetch Monitor annually  - last 08/20/22  PVC's (premature ventricular contractions) Monitor, followed by Dr. [name]  Paroxysmal atrial fibrillation (HCC) S/P ablation 05/06/22- normal rhythm Continue Eliquis 5 mg BID, Diltiazem 240 mg PRN for afib rate > 100 1 tab every 4 hours Go to the ER if any chest pain, shortness of breath, nausea, dizziness, severe HA, changes vision/speech   Essential hypertension Continue medication- Telmisartan 40 mg QD Monitor blood pressure at home; call if consistently over 130/80 Continue DASH diet.   Reminder to go to the ER if any CP, SOB, nausea, dizziness, severe HA, changes vision/speech, left arm numbness and tingling and jaw pain. - CBC  Chronic obstructive pulmonary disease, unspecified COPD type (HCC) Asymptomatic, former smokr Continue to monitor  Vitamin D deficiency Continue supplementation Check vitamin D level annually and as needed   Overweight (BMI 25.0-29.9) Long discussion about weight loss, diet, and exercise Recommended diet heavy in fruits and veggies and low in animal meats, cheeses, and dairy products, appropriate calorie intake Discussed appropriate weight for height Follow up at next visit - TSH  Abnormal glucose Recent A1Cs at goal Discussed diet/exercise, weight management  Defer A1C; check CMP  Hyperlipidemia Continue Repatha 140 mg q 2 weeks and zetia 10 mg every day  Continue low cholesterol diet and exercise.  Check lipid panel. TSH  Anxiety and depression Well managed by current regimen;  Currently on Celexa 40 mg daily, tried tapering and had additional stress  Will try slow taper down alprazolam 1 mg- last filled 11/17/22 #20 Stress management techniques discussed, increase water, good sleep hygiene discussed, increase exercise, and increase  veggies.    Osteoarthritis, bilateral Continue supplements, weight loss, regular gentle exercise  Left shoulder pain Scheduling reverse total shoulder replacement with Dr. Ave Filter  Medication management -     CBC with Differential/Platelet -     COMPLETE METABOLIC PANEL WITH GFR -     Lipid panel -     TSH  Rectal bleeding Hemoccult negative x 3 Will send lab results to Dr. Leone Payor as well -     Iron, TIBC and Ferritin Panel -     POC Hemoccult Bld/Stl (3-Cd Home Screen)- all 3 are negative- done in office  Hemorrhoids, unspecified hemorrhoid type Large hemorrhoid preset but no bleeding presently -     POC Hemoccult Bld/Stl (3-Cd Home Screen); all 3 negative- done in office       Over 30 minutes of face to face exam, counseling, chart review and critical decision making was performed Future Appointments  Date Time Provider Department Center  11/27/2022  9:20 AM [facility] APP [facility] None  02/09/2023  2:30 PM [name], MD [facility] None  02/12/2023  1:30 PM Alphonzo Severance R, PA [facility] None  03/23/2023  4:00 PM Adela Glimpse, NP [facility] None  08/13/2023  2:00 PM [name], MD [facility] None        Subjective:  Taylor Li is a 72 y.o. female who presents for Medicare Annual Wellness Visit and 3 month follow up. She has Essential hypertension; Hyperlipidemia; Vitamin D deficiency; Other abnormal glucose; PAF (paroxysmal atrial fibrillation) (HCC); Medication management; History of colonic polyps; Anxiety and depression; COPD (chronic obstructive pulmonary disease) (HCC); Overweight (BMI 25.0-29.9); PVC's (premature ventricular contractions); Thoracic aortic aneurysm (HCC); Fatigue; Hypercoagulable state due  to persistent atrial fibrillation (HCC); and CAD (coronary artery disease) on their problem list.  She does have left shoulder pain and is to have a reverse total shoulder replacement with Dr. Ave Filter   Moderate  nonobstructive CAD by coronary CTA 08/2022- denies chest pain symptoms. Continues Eliquis BID, repatha 140 mg q 2 weeks and zetia 10 mg every day . Thoracic aortic aneurysm stable and followed by CT yearly. Status post PVI ablation in 2024 for atrial fibrillation. She is doing well without recurrent symptoms of atrial fibrillation. Continues Eliquis 5 mg twice daily.  Follows with electrophysiology Per Dr. [name] she is to stop Eliquis 3 days prior to procedure.    She has been experiencing bright red rectal bleeding from hemorrhoids.  She did a home hemoccult 1 week ago that came back positive x 3 and was referred to GI.   She has been using Gabapentin 100 mg 3 tabs at bedtime for sleep- minimal results.   She has a diagnosis of depression/anxiety and is currently on wellbutrin 300 mg and celexa 40 mg daily, PRN xanax, reports symptoms are fairly well controlled on current regimen. she currently takes 1 at night for sleep PRN, receptive to taper down after discussion of risks due to pharmacokinetics in older adults, increased risk of unintentional overdose with daily use. Last Alprazolam 1 mg #20 was filled on 11/17/22- continues to taper  BMI is Body mass index is 29.92 kg/m., she has not been working on diet and exercise, likes to dance.  Wt Readings from Last 3 Encounters:  11/24/22 196 lb 12.8 oz (89.3 kg)  08/29/22 191 lb 6.4 oz (86.8 kg)  08/12/22 190 lb 12.8 oz (86.5 kg)    Bp is controlled with Telmisartan 40 mg QD. Her blood pressure today their BP is    BP Readings from Last 3 Encounters:  08/29/22 130/80  08/20/22 107/71  08/13/22 107/61  She does not workout. She denies chest pain, shortness of breath, dizziness.    She is currently on cholesterol medication- Repatha 140 mg q 2 weeks and Zetia 10 mg every day , taking triple omega 3 supplement (Statins cause myalgias even with rosuvastatin 5 mg 3 days a week).   Her cholesterol is at goal. The cholesterol last visit was:   Lab  Results  Component Value Date   CHOL 141 08/04/2022   HDL 91 08/04/2022   LDLCALC 29 08/04/2022   TRIG 120 08/04/2022   CHOLHDL 1.5 08/04/2022    She has not been working on diet and exercise for glucose management, and denies foot ulcerations, increased appetite, nausea, paresthesia of the feet, polydipsia, polyuria and visual disturbances. Last A1C in the office was:  Lab Results  Component Value Date   HGBA1C 5.8 (H) 08/04/2022   Last GFR: Lab Results  Component Value Date   EGFR 93 08/04/2022    Patient is newly on Vitamin D supplement.   Lab Results  Component Value Date   VD25OH 79 08/04/2022      Medication Review: Current Outpatient Medications on File Prior to Visit  Medication Sig Dispense Refill   acetaminophen (TYLENOL) 650 MG CR tablet Take 1,300 mg by mouth every 8 (eight) hours as needed for pain.     ALPRAZolam (XANAX) 1 MG tablet TAKE 1/2-1 TABLET BY MOUTH AT BEDTIME 20 tablet 0   apixaban (ELIQUIS) 5 MG TABS tablet Take 1 tablet (5 mg total) by mouth 2 (two) times daily. 180 tablet 3   Ascorbic Acid (VITAMIN  C) 1000 MG tablet Take 1,000 mg by mouth at bedtime.     Cholecalciferol (DIALYVITE VITAMIN D 5000) 125 MCG (5000 UT) capsule Take 10,000 Units by mouth at bedtime.     cyclobenzaprine (FLEXERIL) 10 MG tablet TAKE 1 TABLET BY MOUTH TWICE A DAY AS NEEDED FOR MUSCLE SPASMS 60 tablet 0   diltiazem (CARDIZEM) 30 MG tablet Take 1 tablet every 4 hours AS NEEDED for AFIB heart rate >100 as long as top BP >100. 30 tablet 1   Evolocumab (REPATHA SURECLICK) 140 MG/ML SOAJ Inject 140 mg into the skin every 14 (fourteen) days. 6 mL 3   ezetimibe (ZETIA) 10 MG tablet TAKE 1 TABLET BY MOUTH EVERY DAY FOR CHOLESTEROL 90 tablet 3   gabapentin (NEURONTIN) 100 MG capsule TAKE 1 TO 3 CAPSULES 1 TO 2 HOURS BEFORE BEDTIME AS NEEDED FOR SLEEP 90 capsule 3   Magnesium 500 MG TABS Take 500-1,000 mg by mouth at bedtime.     OVER THE COUNTER MEDICATION Apply 1 application   topically daily as needed (Pain). [place] Marijuana     saccharomyces boulardii (FLORASTOR) 250 MG capsule Take 250 mg by mouth daily.     telmisartan (MICARDIS) 40 MG tablet Take 1 tablet (40 mg total) by mouth daily. 90 tablet 3   Zinc 50 MG TABS Take 50 mg by mouth every other day. At bedtime     No current facility-administered medications on file prior to visit.    Allergies  Allergen Reactions   Rosuvastatin     myalgias   Other Itching and Rash    Fresh green peppers    Current Problems (verified) Patient Active Problem List   Diagnosis Date Noted   CAD (coronary artery disease) 05/18/2022   Hypercoagulable state due to persistent atrial fibrillation (HCC) 03/26/2022   Fatigue 02/16/2020   PVC's (premature ventricular contractions) 09/04/2017   Thoracic aortic aneurysm (HCC) 09/04/2017   Overweight (BMI 25.0-29.9) 03/10/2017   COPD (chronic obstructive pulmonary disease) (HCC) 09/29/2016   Anxiety and depression 12/06/2013   History of colonic polyps 09/22/2013   Medication management 09/09/2013   Essential hypertension 01/14/2013   Hyperlipidemia 01/14/2013   Vitamin D deficiency 01/14/2013   Other abnormal glucose 01/14/2013   PAF (paroxysmal atrial fibrillation) (HCC) 01/14/2013     Names of Other Physician/Practitioners you currently use: 1. [facility] here for primary care 2. [facility], eye doctor, last visit 2000, Due for 2022 3. Dr, Yehuda Mao dentist, last visit 2023  Patient Care Team: [name], MD as PCP - General (Internal Medicine) Runell Gess, MD as PCP - Cardiology (Cardiology) [name], MD as PCP - Electrophysiology (Cardiology)  SURGICAL HISTORY She  has a past surgical history that includes Laminectomy (1982); Breast surgery (Bilateral, 1983); Vaginal hysterectomy (1985); Tonsillectomy; Nasal septum surgery; Cardioversion; Appendectomy; Cholecystectomy; Colonoscopy; TEE without  cardioversion (N/A, 12/13/2021); Cardioversion (N/A, 12/13/2021); and ATRIAL FIBRILLATION ABLATION (N/A, 05/06/2022). FAMILY HISTORY Her family history includes Atrial fibrillation in her mother; Barrett's esophagus in her mother; Clotting disorder in her father; Hypertension in her father and mother; Lung cancer in her father; Mitral valve prolapse in her mother; Thyroid disease in her father. SOCIAL HISTORY She  reports that she quit smoking about 10 years ago. Her smoking use included cigarettes and e-cigarettes. She has never used smokeless tobacco. She reports current alcohol use. She reports that she does not use drugs.    Review of Systems  Constitutional:  Negative for  malaise/fatigue and weight loss.  HENT:  Negative for hearing loss and tinnitus.   Eyes:  Negative for blurred vision and double vision.  Respiratory:  Negative for cough, sputum production, shortness of breath and wheezing.   Cardiovascular:  Negative for chest pain, palpitations, orthopnea, claudication, leg swelling and PND.  Gastrointestinal:  Negative for abdominal pain, blood in stool, constipation, diarrhea, heartburn, melena, nausea and vomiting.  Genitourinary: Negative.   Musculoskeletal:  Positive for joint pain (Right shoulder and bicep). Negative for falls and myalgias.  Skin:  Negative for rash.  Neurological:  Negative for dizziness, tingling, sensory change, weakness and headaches.  Endo/Heme/Allergies:  Negative for polydipsia.  Psychiatric/Behavioral:  Positive for depression (in remission on meds). Negative for memory loss, substance abuse and suicidal ideas. The patient is nervous/anxious and has insomnia (controlled with xanax).   All other systems reviewed and are negative.    Objective:     Today's Vitals   11/24/22 1521  Pulse: 81  Temp: (!) 97.5 F (36.4 C)  SpO2: 94%  Weight: 196 lb 12.8 oz (89.3 kg)  Height: 5\' 8"  (1.727 m)    Body mass index is 29.92 kg/m.  General appearance:  alert, no distress, WD/WN, female HEENT: normocephalic, sclerae anicteric, TMs pearly, nares patent, no discharge or erythema, pharynx normal Oral cavity: MMM, no lesions Neck: supple, no lymphadenopathy, no thyromegaly, no masses Heart: Regular rate and rhythm, no murmurs, rubs or gallops.  Lungs: CTA bilaterally, no wheezes, rhonchi, or rales Abdomen: +bs, soft, non tender, non distended, no masses, no hepatomegaly, no splenomegaly Musculoskeletal: nontender, no swelling, DIP and PIP joint enlargment, tenderness and erythema to left 2nd digit.  Right shoulder tenderness anteriorly, decreased ROM of right shoulder and right bicep, small mass in middle of right bicep that is very tender Pulses: 2+ symmetric, upper and lower extremities, normal cap refill Neurological: alert, oriented x 3, CN2-12 intact, strength normal lower extremities, sensation normal throughout, DTRs 2+ throughout, no cerebellar signs, gait normal Rectal- large hemorrhoid not bleeding.  Hemoccult negative x 3 today.  Psychiatric: normal affect, behavior normal, pleasant     Raynelle Dick, NP   11/24/2022

## 2022-11-20 NOTE — Telephone Encounter (Signed)
Patient with diagnosis of afib on Eliquis for anticoagulation.    Procedure: Left Reverse Total Shoulder Replacement  Date of procedure: TBD   CHA2DS2-VASc Score = 4   This indicates a 4.8% annual risk of stroke. The patient's score is based upon: CHF History: 0 HTN History: 1 Diabetes History: 0 Stroke History: 0 Vascular Disease History: 1 Age Score: 1 Gender Score: 1      CrCl 86 Platelet count 274  Per office protocol, patient can hold Eliquis for 3 days prior to procedure.    **This guidance is not considered finalized until pre-operative APP has relayed final recommendations.**

## 2022-11-20 NOTE — Telephone Encounter (Signed)
Patient scheduled for tele visit on 11/27/22. Med rec and consent done

## 2022-11-24 ENCOUNTER — Ambulatory Visit (INDEPENDENT_AMBULATORY_CARE_PROVIDER_SITE_OTHER): Payer: PPO | Admitting: Nurse Practitioner

## 2022-11-24 ENCOUNTER — Encounter: Payer: Self-pay | Admitting: Nurse Practitioner

## 2022-11-24 VITALS — BP 112/76 | HR 81 | Temp 97.5°F | Ht 68.0 in | Wt 196.8 lb

## 2022-11-24 DIAGNOSIS — I480 Paroxysmal atrial fibrillation: Secondary | ICD-10-CM | POA: Diagnosis not present

## 2022-11-24 DIAGNOSIS — E782 Mixed hyperlipidemia: Secondary | ICD-10-CM | POA: Diagnosis not present

## 2022-11-24 DIAGNOSIS — I712 Thoracic aortic aneurysm, without rupture, unspecified: Secondary | ICD-10-CM

## 2022-11-24 DIAGNOSIS — Z79899 Other long term (current) drug therapy: Secondary | ICD-10-CM

## 2022-11-24 DIAGNOSIS — R7309 Other abnormal glucose: Secondary | ICD-10-CM | POA: Diagnosis not present

## 2022-11-24 DIAGNOSIS — K649 Unspecified hemorrhoids: Secondary | ICD-10-CM

## 2022-11-24 DIAGNOSIS — F32A Depression, unspecified: Secondary | ICD-10-CM

## 2022-11-24 DIAGNOSIS — M158 Other polyosteoarthritis: Secondary | ICD-10-CM

## 2022-11-24 DIAGNOSIS — I10 Essential (primary) hypertension: Secondary | ICD-10-CM | POA: Diagnosis not present

## 2022-11-24 DIAGNOSIS — E559 Vitamin D deficiency, unspecified: Secondary | ICD-10-CM

## 2022-11-24 DIAGNOSIS — J449 Chronic obstructive pulmonary disease, unspecified: Secondary | ICD-10-CM | POA: Diagnosis not present

## 2022-11-24 DIAGNOSIS — K625 Hemorrhage of anus and rectum: Secondary | ICD-10-CM | POA: Diagnosis not present

## 2022-11-24 DIAGNOSIS — F419 Anxiety disorder, unspecified: Secondary | ICD-10-CM | POA: Diagnosis not present

## 2022-11-24 DIAGNOSIS — M25512 Pain in left shoulder: Secondary | ICD-10-CM

## 2022-11-24 DIAGNOSIS — I493 Ventricular premature depolarization: Secondary | ICD-10-CM | POA: Diagnosis not present

## 2022-11-24 DIAGNOSIS — E663 Overweight: Secondary | ICD-10-CM | POA: Diagnosis not present

## 2022-11-24 LAB — POC HEMOCCULT BLD/STL (HOME/3-CARD/SCREEN)
Card #2 Fecal Occult Blod, POC: NEGATIVE
Card #3 Fecal Occult Blood, POC: NEGATIVE
Fecal Occult Blood, POC: NEGATIVE

## 2022-11-24 NOTE — Patient Instructions (Signed)
Shoulder Pain Many things can cause shoulder pain, including: An injury. Moving the shoulder in the same way again and again (overuse). Joint pain (arthritis). Pain can come from: Swelling and irritation (inflammation) of any part of the shoulder. An injury to: The shoulder joint. Tissues that connect muscle to bone (tendons). Tissues that connect bones to each other (ligaments). Bones. Follow these instructions at home: Watch for changes in your symptoms. Let your doctor know about them. Follow these instructions to help with your pain. If you have a sling that can be taken off: Wear the sling as told by your doctor. Take it off only as told by your doctor. Check the skin around the sling every day. Tell your doctor if you see problems. Loosen the sling if your fingers: Tingle. Become numb. Become cold. Keep the sling clean. If the sling is not waterproof: Do not let it get wet. Take the sling off when you shower or bathe. Managing pain, stiffness, and swelling  If told, put ice on the painful area. Put ice in a plastic bag. Place a towel between your skin and the bag. Leave the ice on for 20 minutes, 2-3 times a day. Stop putting ice on if it does not help with the pain. If your skin turns bright red, take off the ice right away to prevent skin damage. The risk of damage is higher if you cannot feel pain, heat, or cold. Squeeze a soft ball or a foam pad as much as possible. This prevents swelling in the shoulder. It also helps to strengthen the arm. General instructions Take over-the-counter and prescription medicines only as told by your doctor. Keep all follow-up visits. This will help you avoid any type of permanent shoulder problems. Contact a doctor if: Your pain gets worse. Medicine does not help your pain. You have new pain in your arm, hand, or fingers. You loosen your sling and your arm, hand, or fingers: Tingle. Are numb. Are swollen. Get help right away  if: Your arm, hand, or fingers turn white or blue. This information is not intended to replace advice given to you by your health care provider. Make sure you discuss any questions you have with your health care provider. Document Revised: 08/02/2021 Document Reviewed: 08/02/2021 Elsevier Patient Education  2024 Elsevier Inc.  

## 2022-11-24 NOTE — Telephone Encounter (Signed)
Hemoccult x 3 was negative in the office today

## 2022-11-25 ENCOUNTER — Encounter: Payer: Self-pay | Admitting: Cardiovascular Disease

## 2022-11-25 LAB — CBC WITH DIFFERENTIAL/PLATELET
Absolute Lymphocytes: 2839 {cells}/uL (ref 850–3900)
Absolute Monocytes: 561 {cells}/uL (ref 200–950)
Basophils Absolute: 34 {cells}/uL (ref 0–200)
Basophils Relative: 0.4 %
Eosinophils Absolute: 51 {cells}/uL (ref 15–500)
Eosinophils Relative: 0.6 %
HCT: 43.2 % (ref 35.0–45.0)
Hemoglobin: 14.4 g/dL (ref 11.7–15.5)
MCH: 29.8 pg (ref 27.0–33.0)
MCHC: 33.3 g/dL (ref 32.0–36.0)
MCV: 89.4 fL (ref 80.0–100.0)
MPV: 10.9 fL (ref 7.5–12.5)
Monocytes Relative: 6.6 %
Neutro Abs: 5015 {cells}/uL (ref 1500–7800)
Neutrophils Relative %: 59 %
Platelets: 274 10*3/uL (ref 140–400)
RBC: 4.83 10*6/uL (ref 3.80–5.10)
RDW: 12.2 % (ref 11.0–15.0)
Total Lymphocyte: 33.4 %
WBC: 8.5 10*3/uL (ref 3.8–10.8)

## 2022-11-25 LAB — COMPLETE METABOLIC PANEL WITH GFR
AG Ratio: 1.7 (calc) (ref 1.0–2.5)
ALT: 24 U/L (ref 6–29)
AST: 18 U/L (ref 10–35)
Albumin: 4.6 g/dL (ref 3.6–5.1)
Alkaline phosphatase (APISO): 78 U/L (ref 37–153)
BUN: 19 mg/dL (ref 7–25)
CO2: 29 mmol/L (ref 20–32)
Calcium: 10 mg/dL (ref 8.6–10.4)
Chloride: 103 mmol/L (ref 98–110)
Creat: 0.69 mg/dL (ref 0.60–1.00)
Globulin: 2.7 g/dL (ref 1.9–3.7)
Glucose, Bld: 95 mg/dL (ref 65–99)
Potassium: 4.8 mmol/L (ref 3.5–5.3)
Sodium: 140 mmol/L (ref 135–146)
Total Bilirubin: 0.7 mg/dL (ref 0.2–1.2)
Total Protein: 7.3 g/dL (ref 6.1–8.1)
eGFR: 93 mL/min/{1.73_m2} (ref >=60)

## 2022-11-25 LAB — LIPID PANEL
Cholesterol: 161 mg/dL (ref <200)
HDL: 85 mg/dL (ref >=50)
LDL Cholesterol (Calc): 58 mg/dL
Non-HDL Cholesterol (Calc): 76 mg/dL (ref <130)
Total CHOL/HDL Ratio: 1.9 (calc) (ref <5.0)
Triglycerides: 101 mg/dL (ref <150)

## 2022-11-25 LAB — IRON,TIBC AND FERRITIN PANEL
%SAT: 38 % (ref 16–45)
Ferritin: 65 ng/mL (ref 16–288)
Iron: 129 ug/dL (ref 45–160)
TIBC: 338 ug/dL (ref 250–450)

## 2022-11-25 LAB — TSH: TSH: 0.82 m[IU]/L (ref 0.40–4.50)

## 2022-11-27 ENCOUNTER — Ambulatory Visit: Payer: PPO | Attending: Cardiology | Admitting: Nurse Practitioner

## 2022-11-27 DIAGNOSIS — Z01810 Encounter for preprocedural cardiovascular examination: Secondary | ICD-10-CM | POA: Diagnosis not present

## 2022-11-27 NOTE — Progress Notes (Addendum)
Virtual Visit via Telephone Note   Because of Taylor Li's co-morbid illnesses, she is at least at moderate risk for complications without adequate follow up.  This format is felt to be most appropriate for this patient at this time.  The patient did not have access to video technology/had technical difficulties with video requiring transitioning to audio format only (telephone).  All issues noted in this document were discussed and addressed.  No physical exam could be performed with this format.  Please refer to the patient's chart for her consent to telehealth for [facility].  Evaluation Performed:  Preoperative cardiovascular risk assessment _____________   Date:  11/27/2022   Patient ID:  Taylor Li, DOB Mar 06, 1950, MRN [id number] Patient Location:  Home Provider location:   Office  Primary Care Provider:  [name], MD Primary Cardiologist:  Nanetta Batty, MD  Chief Complaint / Patient Profile   71 y.o. y/o female with a h/o moderate nonobstructive CAD by coronary CTA in 08/2022, paroxysmal atrial fibrillation, thoracic aortic aneurysm, hypertension, and hyperlipidemia who is pending left reverse total shoulder replacement with Dr. Ave Filter of [facility] orthopedics and presents today for telephonic preoperative cardiovascular risk assessment.  History of Present Illness    Taylor Li is a 72 y.o. female who presents via audio/video conferencing for a telehealth visit today.  Pt was last seen in cardiology clinic on 08/29/2022 by Tereso Newcomer, PA.  At that time Taylor Li was doing well.  The patient is now pending procedure as outlined above. Since her last visit, she has done well from a cardiac standpoint.   She denies chest pain, palpitations, dyspnea, pnd, orthopnea, n, v, dizziness, syncope, edema, weight gain, or early satiety. All other systems reviewed and are otherwise negative except as noted above.   Past Medical History    Past  Medical History:  Diagnosis Date   A-fib [facility])    Arthritis    C. difficile diarrhea    Hyperlipidemia    Hypertension    Personal history of colonic polyp- adenoma 09/22/2013   Pre-diabetes    Rectal prolapse    Vitamin D deficiency    Past Surgical History:  Procedure Laterality Date   APPENDECTOMY     ATRIAL FIBRILLATION ABLATION N/A 05/06/2022   Procedure: ATRIAL FIBRILLATION ABLATION;  Surgeon: [name], MD;  Location: [facility] INVASIVE CV LAB;  Service: Cardiovascular;  Laterality: N/A;   BREAST SURGERY Bilateral 1983   SQ mastectomies   CARDIOVERSION     CARDIOVERSION N/A 12/13/2021   Procedure: CARDIOVERSION;  Surgeon: [name], MD;  Location: [facility] ENDOSCOPY;  Service: Cardiovascular;  Laterality: N/A;   CHOLECYSTECTOMY     COLONOSCOPY     LAMINECTOMY  1982   L4-L5   NASAL SEPTUM SURGERY     TEE WITHOUT CARDIOVERSION N/A 12/13/2021   Procedure: TRANSESOPHAGEAL ECHOCARDIOGRAM (TEE);  Surgeon: [name], MD;  Location: [facility] ENDOSCOPY;  Service: Cardiovascular;  Laterality: N/A;   TONSILLECTOMY     VAGINAL HYSTERECTOMY  1985    Allergies  Allergies  Allergen Reactions   Rosuvastatin     myalgias   Other Itching and Rash    Fresh green peppers    Home Medications    Prior to Admission medications   Medication Sig Start Date End Date Taking? Authorizing Provider  acetaminophen (TYLENOL) 650 MG CR tablet Take 1,300 mg by mouth every 8 (eight) hours as needed for pain.    [provider]  ALPRAZolam Prudy Feeler)  1 MG tablet TAKE 1/2-1 TABLET BY MOUTH AT BEDTIME 11/17/22   Raynelle Dick, NP  apixaban (ELIQUIS) 5 MG TABS tablet Take 1 tablet (5 mg total) by mouth 2 (two) times daily. 12/10/21   Tereso Newcomer T, PA-C  Ascorbic Acid (VITAMIN C) 1000 MG tablet Take 1,000 mg by mouth at bedtime.    [provider]  Cholecalciferol (DIALYVITE VITAMIN D 5000) 125 MCG (5000 UT) capsule Take 10,000 Units by mouth at bedtime.    [provider]  cyclobenzaprine (FLEXERIL) 10 MG tablet TAKE 1 TABLET BY MOUTH TWICE A DAY AS NEEDED FOR MUSCLE SPASMS 11/06/22   Raynelle Dick, NP  diltiazem (CARDIZEM) 30 MG tablet Take 1 tablet every 4 hours AS NEEDED for AFIB heart rate >100 as long as top BP >100. 03/26/22   Fenton, Clint R, PA  Evolocumab (REPATHA SURECLICK) 140 MG/ML SOAJ Inject 140 mg into the skin every 14 (fourteen) days. 06/06/22   Runell Gess, MD  ezetimibe (ZETIA) 10 MG tablet TAKE 1 TABLET BY MOUTH EVERY DAY FOR CHOLESTEROL 09/17/22   Raynelle Dick, NP  gabapentin (NEURONTIN) 100 MG capsule TAKE 1 TO 3 CAPSULES 1 TO 2 HOURS BEFORE BEDTIME AS NEEDED FOR SLEEP 11/17/22   [name], MD  Magnesium 500 MG TABS Take 500-1,000 mg by mouth at bedtime.    [provider]  OVER THE COUNTER MEDICATION Apply 1 application  topically daily as needed (Pain). [place] Marijuana    [provider]  saccharomyces boulardii (FLORASTOR) 250 MG capsule Take 250 mg by mouth daily.    [provider]  telmisartan (MICARDIS) 40 MG tablet Take 1 tablet (40 mg total) by mouth daily. 07/01/22   Tereso Newcomer T, PA-C  Zinc 50 MG TABS Take 50 mg by mouth every other day. At bedtime    [provider]    Physical Exam    Vital Signs:  Taylor Li does not have vital signs available for review today.  Given telephonic nature of communication, physical exam is limited. AAOx3. NAD. Normal affect.  Speech and respirations are unlabored.  Accessory Clinical Findings    None  Assessment & Plan    1.  Preoperative Cardiovascular Risk Assessment:  According to the Revised Cardiac Risk Index (RCRI), her Perioperative Risk of Major Cardiac Event is (%): 0.4. Her Functional Capacity in METs is: 7.99 according to the Duke Activity Status Index (DASI).Therefore, based on ACC/AHA guidelines, patient would be at acceptable risk for the planned procedure without further cardiovascular testing.  The  patient was advised that if she develops new symptoms prior to surgery to contact our office to arrange for a follow-up visit, and she verbalized understanding.  Per office protocol, patient can hold Eliquis for 3 days prior to procedure. Please resume Eliquis as soon as possible postprocedure, at the discretion of the surgeon.     A copy of this note will be routed to requesting surgeon.  Time:   Today, I have spent 5 minutes with the patient with telehealth technology discussing medical history, symptoms, and management plan.     Joylene Grapes, NP  11/27/2022, 9:35 AM

## 2022-12-02 ENCOUNTER — Other Ambulatory Visit: Payer: Self-pay | Admitting: Orthopedic Surgery

## 2022-12-15 ENCOUNTER — Other Ambulatory Visit: Payer: Self-pay | Admitting: Nurse Practitioner

## 2022-12-15 DIAGNOSIS — M25511 Pain in right shoulder: Secondary | ICD-10-CM

## 2022-12-16 NOTE — Patient Instructions (Addendum)
SURGICAL WAITING ROOM VISITATION Patients having surgery or a procedure may have no more than 2 support people in the waiting area - these visitors may rotate in the visitor waiting room.   Due to an increase in RSV and influenza rates and associated hospitalizations, children ages 47 and under may not visit patients in [facility] hospitals. If the patient needs to stay at the hospital during part of their recovery, the visitor guidelines for inpatient rooms apply.  PRE-OP VISITATION  Pre-op nurse will coordinate an appropriate time for 1 support person to accompany the patient in pre-op.  This support person may not rotate.  This visitor will be contacted when the time is appropriate for the visitor to come back in the pre-op area.  Please refer to the [facility] website for the visitor guidelines for Inpatients (after your surgery is over and you are in a regular room).  You are not required to quarantine at this time prior to your surgery. However, you must do this: Hand Hygiene often Do NOT share personal items Notify your provider if you are in close contact with someone who has COVID or you develop fever 100.4 or greater, new onset of sneezing, cough, sore throat, shortness of breath or body aches.  If you test positive for Covid or have been in contact with anyone that has tested positive in the last 10 days please notify you surgeon.    Your procedure is scheduled on:  Thursday  December 25, 2022   Report to [facility] Main Entrance: [place] entrance where the [employer] is available.   Report to admitting at:  05:15   AM  Call this number if you have any questions or problems the morning of surgery [phone]  Do not eat food after Midnight the night prior to your surgery/procedure.  After Midnight you may have the following liquids until  04:30 AM DAY OF SURGERY  Clear Liquid Diet Water Black Coffee (sugar ok, NO MILK/CREAM OR CREAMERS)  Tea (sugar ok, NO  MILK/CREAM OR CREAMERS) regular and decaf                             Plain Jell-O  with no fruit (NO RED)                                           Fruit ices (not with fruit pulp, NO RED)                                     Popsicles (NO RED)                                                                  Juice: NO CITRUS JUICES: only apple, WHITE grape, WHITE cranberry Sports drinks like Gatorade or Powerade (NO RED)                    The day of surgery:  Drink ONE (1) Pre-Surgery G2 at 04:30 AM the morning of surgery. Drink  in one sitting. Do not sip.  This drink was given to you during your hospital pre-op appointment visit. Nothing else to drink after completing the Pre-Surgery G2 : No candy, chewing gum or throat lozenges.    FOLLOW ANY ADDITIONAL PRE OP INSTRUCTIONS YOU RECEIVED FROM YOUR SURGEON'S OFFICE!!!   Oral Hygiene is also important to reduce your risk of infection.        Remember - BRUSH YOUR TEETH THE MORNING OF SURGERY WITH YOUR REGULAR TOOTHPASTE  Do NOT smoke after Midnight the night before surgery.  STOP TAKING all Vitamins, Herbs and supplements 1 week before your surgery.   Take ONLY these medicines the morning of surgery with A SIP OF WATER: ezetimibe.  You may take Tylenol if needed for pain. You may take Diltiazem if needed to correct HR >100 if BP top number is >100                   You may not have any metal on your body including hair pins, jewelry, and body piercing  Do not wear make-up, lotions, powders, perfumes or deodorant  Do not wear nail polish including gel and S&S, artificial / acrylic nails, or any other type of covering on natural nails including finger and toenails. If you have artificial nails, gel coating, etc., that needs to be removed by a nail salon, Please have this removed prior to surgery. Not doing so may mean that your surgery could be cancelled or delayed if the Surgeon or anesthesia staff feels like they are unable to monitor  you safely.   Do not shave 48 hours prior to surgery to avoid nicks in your skin which may contribute to postoperative infections.   Contacts, Hearing Aids, dentures or bridgework may not be worn into surgery. DENTURES WILL BE REMOVED PRIOR TO SURGERY PLEASE DO NOT APPLY "Poly grip" OR ADHESIVES!!!  Patients discharged on the day of surgery will not be allowed to drive home.  Someone NEEDS to stay with you for the first 24 hours after anesthesia.  Do not bring your home medications to the hospital. The Pharmacy will dispense medications listed on your medication list to you during your admission in the Hospital.  Special Instructions: Bring a copy of your healthcare power of attorney and living will documents the day of surgery, if you wish to have them scanned into your [facility]- EPIC  Please read over the following fact sheets you were given: IF YOU HAVE QUESTIONS ABOUT YOUR PRE-OP INSTRUCTIONS, PLEASE CALL [phone].     Pre-operative 5 CHG Bath Instructions   You can play a key role in reducing the risk of infection after surgery. Your skin needs to be as free of germs as possible. You can reduce the number of germs on your skin by washing with CHG (chlorhexidine gluconate) soap before surgery. CHG is an antiseptic soap that kills germs and continues to kill germs even after washing.   DO NOT use if you have an allergy to chlorhexidine/CHG or antibacterial soaps. If your skin becomes reddened or irritated, stop using the CHG and notify one of our RNs at [phone]  Please shower with the CHG soap starting 4 days before surgery using the following schedule: START SHOWERS ON   SUNDAY  December 21, 2022  Please keep in mind the following:  DO NOT shave, including legs and underarms,  starting the day of your first shower.   You may shave your face at any point before/day of surgery.   Place clean sheets on your bed the day you start using CHG soap. Use a clean washcloth (not used since being washed) for each shower. DO NOT sleep with pets once you start using the CHG.   CHG Shower Instructions:  If you choose to wash your hair and private area, wash first with your normal shampoo/soap.  After you use shampoo/soap, rinse your hair and body thoroughly to remove shampoo/soap residue.  Turn the water OFF and apply about 3 tablespoons (45 ml) of CHG soap to a CLEAN washcloth.  Apply CHG soap ONLY FROM YOUR NECK DOWN TO YOUR TOES (washing for 3-5 minutes)  DO NOT use CHG soap on face, private areas, open wounds, or sores.  Pay special attention to the area where your surgery is being performed.  If you are having back surgery, having someone wash your back for you may be helpful.  Wait 2 minutes after CHG soap is applied, then you may rinse off the CHG soap.  Pat dry with a clean towel  Put on clean clothes/pajamas   If you choose to wear lotion, please use ONLY the CHG-compatible lotions on the back of this paper.     Additional instructions for the day of surgery: DO NOT APPLY any lotions, deodorants, cologne, or perfumes.   Put on clean/comfortable clothes.  Brush your teeth.  Ask your nurse before applying any prescription medications to the skin.      CHG Compatible Lotions   Aveeno Moisturizing lotion  Cetaphil Moisturizing Cream  Cetaphil Moisturizing Lotion  Clairol Herbal Essence Moisturizing Lotion, Dry Skin  Clairol Herbal Essence Moisturizing Lotion, Extra Dry Skin  Clairol Herbal Essence Moisturizing Lotion, Normal Skin  Curel Age Defying Therapeutic Moisturizing Lotion with Alpha Hydroxy  Curel Extreme Care Body Lotion  Curel Soothing Hands Moisturizing Hand Lotion  Curel Therapeutic Moisturizing Cream, Fragrance-Free  Curel Therapeutic  Moisturizing Lotion, Fragrance-Free  Curel Therapeutic Moisturizing Lotion, Original Formula  Eucerin Daily Replenishing Lotion  Eucerin Dry Skin Therapy Plus Alpha Hydroxy Crme  Eucerin Dry Skin Therapy Plus Alpha Hydroxy Lotion  Eucerin Original Crme  Eucerin Original Lotion  Eucerin Plus Crme Eucerin Plus Lotion  Eucerin TriLipid Replenishing Lotion  Keri Anti-Bacterial Hand Lotion  Keri Deep Conditioning Original Lotion Dry Skin Formula Softly Scented  Keri Deep Conditioning Original Lotion, Fragrance Free Sensitive Skin Formula  Keri Lotion Fast Absorbing Fragrance Free Sensitive Skin Formula  Keri Lotion Fast Absorbing Softly Scented Dry Skin Formula  Keri Original Lotion  Keri Skin Renewal Lotion Keri Silky Smooth Lotion  Keri Silky Smooth Sensitive Skin Lotion  Nivea Body Creamy Conditioning Oil  Nivea Body Extra Enriched Lotion  Nivea Body Original Lotion  Nivea Body Sheer Moisturizing Lotion Nivea Crme  Nivea Skin Firming Lotion  NutraDerm 30 Skin Lotion  NutraDerm Skin Lotion  NutraDerm Therapeutic Skin Cream  NutraDerm Therapeutic Skin Lotion  ProShield Protective Hand Cream  Provon moisturizing lotion    Preparing for Total Shoulder Arthroplasty ================================================================= Please follow these instructions carefully, in addition to any other special Bathing information that was explained to you at the Presurgical Appointment:  BENZOYL PEROXIDE 5% GEL: Used to kill bacteria on the skin which could cause an infection at the surgery site.   Please do not use if you have an allergy  to benzoyl peroxide. If your skin becomes reddened/irritated stop using the benzoyl peroxide and inform your Doctor.   Starting two days before surgery, apply as follows:  1. Apply benzoyl peroxide gel in the morning and at night. Apply after taking a shower. If you are not taking a shower, clean entire shoulder front, back, and side, along with the  armpit with a clean wet washcloth.  2. Place a quarter-sized dollop of the gel on your SHOULDER and rub in thoroughly, making sure to cover the front, back, and side of your shoulder, along with the armpit.   2 Days prior to Surgery     TUESDAY  12-23-2022 First Application _______ Morning Second Application _______ Night  Day Before Surgery              [facility]  12-24-2022 First Application______ Morning  On the night before surgery, wash your entire body (except hair, face and private areas) with CHG Soap. THEN, rub in the LAST application of the Benzoyl Peroxide Gel on your shoulder.   3. On the Morning of Surgery wash your BODY AGAIN with CHG Soap (except hair, face and private areas)  4. DO NOT USE THE BENZOYL PEROXIDE GEL ON THE DAY OF YOUR SURGERY   FAILURE TO FOLLOW THESE INSTRUCTIONS MAY RESULT IN THE CANCELLATION OF YOUR SURGERY  PATIENT SIGNATURE_________________________________  NURSE SIGNATURE__________________________________  ________________________________________________________________________     Taylor Li    An incentive spirometer is a tool that can help keep your lungs clear and active. This tool measures how well you are filling your lungs with each breath. Taking long deep breaths may help reverse or decrease the chance of developing breathing (pulmonary) problems (especially infection) following: A long period of time when you are unable to move or be active. BEFORE THE PROCEDURE  If the spirometer includes an indicator to show your best effort, your nurse or respiratory therapist will set it to a desired goal. If possible, sit up straight or lean slightly forward. Try not to slouch. Hold the incentive spirometer in an upright position. INSTRUCTIONS FOR USE  Sit on the edge of your bed if possible, or sit up as far as you can in bed or on a chair. Hold the incentive spirometer in an upright position. Breathe out normally. Place the  mouthpiece in your mouth and seal your lips tightly around it. Breathe in slowly and as deeply as possible, raising the piston or the ball toward the top of the column. Hold your breath for 3-5 seconds or for as long as possible. Allow the piston or ball to fall to the bottom of the column. Remove the mouthpiece from your mouth and breathe out normally. Rest for a few seconds and repeat Steps 1 through 7 at least 10 times every 1-2 hours when you are awake. Take your time and take a few normal breaths between deep breaths. The spirometer may include an indicator to show your best effort. Use the indicator as a goal to work toward during each repetition. After each set of 10 deep breaths, practice coughing to be sure your lungs are clear. If you have an incision (the cut made at the time of surgery), support your incision when coughing by placing a pillow or rolled up towels firmly against it. Once you are able to get out of bed, walk around indoors and cough well. You may stop using the incentive spirometer when instructed by your caregiver.  RISKS AND COMPLICATIONS Take your time so you  do not get dizzy or light-headed. If you are in pain, you may need to take or ask for pain medication before doing incentive spirometry. It is harder to take a deep breath if you are having pain. AFTER USE Rest and breathe slowly and easily. It can be helpful to keep track of a log of your progress. Your caregiver can provide you with a simple table to help with this. If you are using the spirometer at home, follow these instructions: SEEK MEDICAL CARE IF:  You are having difficultly using the spirometer. You have trouble using the spirometer as often as instructed. Your pain medication is not giving enough relief while using the spirometer. You develop fever of 100.5 F (38.1 C) or higher.                                                                                                    SEEK IMMEDIATE MEDICAL  CARE IF:  You cough up bloody sputum that had not been present before. You develop fever of 102 F (38.9 C) or greater. You develop worsening pain at or near the incision site. MAKE SURE YOU:  Understand these instructions. Will watch your condition. Will get help right away if you are not doing well or get worse. Document Released: 05/12/2006 Document Revised: 03/24/2011 Document Reviewed: 07/13/2006 [facility] Patient Information 2014 [place], [place].

## 2022-12-16 NOTE — Progress Notes (Signed)
COVID Vaccine received:  []  No [x]  Yes Date of any COVID positive Test in last 90 days:  PCP - [name], MD  Cardiologist - Nanetta Batty, MD , Bernadene Person, NP cardiac clearance in 11-27-22  Epic note EP- Loman Brooklyn, MD  Chest x-ray - 12-17-2021  1v  Epic   Dr. Lenna Gilford repeat?? EKG -  08-04-22  Epic   Dr. Lenna Gilford EKG?   Stress Test -  ECHO - 08-20-2022  Epic Cardiac Cath -  CT coronary calcium score: 340  on 08-20-22   Epic  PCR screen: [x]  Ordered & Completed []   No Order but Needs PROFEND     []   N/A for this surgery  Surgery Plan:  [x]  Ambulatory   []  Outpatient in bed  []  Admit Anesthesia:    []  General  []  Spinal  [x]   Choice []   MAC  Pacemaker / ICD device [x]  No []  Yes   Spinal Cord Stimulator:[x]  No []  Yes       History of Sleep Apnea? [x]  No []  Yes   CPAP used?- [x]  No []  Yes    Does the patient monitor blood sugar?   []  N/A   []  No []  Yes  Patient has: []  NO Hx DM   [x]  Pre-DM   []  DM1  []   DM2 Last A1c was:5.8  on 08-04-2022     Does patient have a [employer] or Dexacom? []  No []  Yes   Fasting Blood Sugar Ranges-  Checks Blood Sugar _____ times a day  Blood Thinner / Instructions:Eliquis  Ok to [employer] x 3 days per Bernadene Person, [place] 11-27-22 Aspirin Instructions:  none  ERAS Protocol Ordered: []  No  [x]  Yes PRE-SURGERY []  ENSURE  [x]  G2   Patient is to be NPO after: 04:30  Dental hx: []  Dentures:  []  N/A      []  Bridge or Partial:                   []  Loose or Damaged teeth:   Comments: Patient was given the 5 CHG shower / bath instructions for Reverse Shoulder arthroplasty surgery along with 2 bottles of the CHG soap. Patient will start this on: 12-21-22 All questions were asked and answered, Patient voiced understanding of this process.   The patient was given Benzoyl peroxide Gel as ordered. Instruction regarding application starting 2 days prior to surgery was given and patient voiced understanding.   Activity level: Patient is able / unable to climb a  flight of stairs without difficulty; []  No CP  []  No SOB, but would have ___   Patient can / can not perform ADLs without assistance.   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  Patient verbalized understanding and agreement to the Pre-Surgical Instructions that were given to them at this PAT appointment. Patient was also educated of the need to review these PAT instructions again prior to her surgery.I reviewed the appropriate phone numbers to call if they have any and questions or concerns.

## 2022-12-17 ENCOUNTER — Encounter (HOSPITAL_COMMUNITY)
Admission: RE | Admit: 2022-12-17 | Discharge: 2022-12-17 | Disposition: A | Discharge status: Home / Self Care | Payer: PPO | Source: Ambulatory Visit | Attending: Orthopedic Surgery | Admitting: Orthopedic Surgery

## 2022-12-17 ENCOUNTER — Encounter (HOSPITAL_COMMUNITY): Payer: Self-pay

## 2022-12-17 ENCOUNTER — Other Ambulatory Visit: Payer: Self-pay

## 2022-12-17 VITALS — BP 130/92 | HR 86 | Temp 97.8°F | Resp 18 | Ht 68.0 in | Wt 191.0 lb

## 2022-12-17 DIAGNOSIS — Z79899 Other long term (current) drug therapy: Secondary | ICD-10-CM | POA: Insufficient documentation

## 2022-12-17 DIAGNOSIS — I2510 Atherosclerotic heart disease of native coronary artery without angina pectoris: Secondary | ICD-10-CM | POA: Diagnosis not present

## 2022-12-17 DIAGNOSIS — Z01812 Encounter for preprocedural laboratory examination: Secondary | ICD-10-CM | POA: Diagnosis not present

## 2022-12-17 DIAGNOSIS — Z01818 Encounter for other preprocedural examination: Secondary | ICD-10-CM

## 2022-12-17 HISTORY — DX: Depression, unspecified: F32.A

## 2022-12-17 HISTORY — DX: Unspecified type of carcinoma in situ of unspecified breast: D05.90

## 2022-12-17 HISTORY — DX: Personal history of urinary calculi: Z87.442

## 2022-12-17 HISTORY — DX: Atherosclerotic heart disease of native coronary artery without angina pectoris: I25.10

## 2022-12-17 HISTORY — DX: Cardiac arrhythmia, unspecified: I49.9

## 2022-12-17 HISTORY — DX: Malignant (primary) neoplasm, unspecified: C80.1

## 2022-12-17 HISTORY — DX: Inflammatory liver disease, unspecified: K75.9

## 2022-12-17 HISTORY — DX: Pneumonia, unspecified organism: J18.9

## 2022-12-17 LAB — CBC
HCT: 46 % (ref 36.0–46.0)
Hemoglobin: 14.8 g/dL (ref 12.0–15.0)
MCH: 29.9 pg (ref 26.0–34.0)
MCHC: 32.2 g/dL (ref 30.0–36.0)
MCV: 92.9 fL (ref 80.0–100.0)
Platelets: 254 10*3/uL (ref 150–400)
RBC: 4.95 MIL/uL (ref 3.87–5.11)
RDW: 12.5 % (ref 11.5–15.5)
WBC: 7.4 10*3/uL (ref 4.0–10.5)
nRBC: 0 % (ref 0.0–0.2)

## 2022-12-17 LAB — SURGICAL PCR SCREEN
MRSA, PCR: NEGATIVE
Staphylococcus aureus: NEGATIVE

## 2022-12-17 LAB — COMPREHENSIVE METABOLIC PANEL
ALT: 26 U/L (ref 0–44)
AST: 21 U/L (ref 15–41)
Albumin: 4.3 g/dL (ref 3.5–5.0)
Alkaline Phosphatase: 77 U/L (ref 38–126)
Anion gap: 10 (ref 5–15)
BUN: 17 mg/dL (ref 8–23)
CO2: 26 mmol/L (ref 22–32)
Calcium: 9.6 mg/dL (ref 8.9–10.3)
Chloride: 102 mmol/L (ref 98–111)
Creatinine, Ser: 0.69 mg/dL (ref 0.44–1.00)
GFR, Estimated: >60 mL/min (ref >60)
Glucose, Bld: 96 mg/dL (ref 70–99)
Potassium: 4.4 mmol/L (ref 3.5–5.1)
Sodium: 138 mmol/L (ref 135–145)
Total Bilirubin: 0.8 mg/dL (ref <1.2)
Total Protein: 7.3 g/dL (ref 6.5–8.1)

## 2022-12-18 NOTE — Care Plan (Signed)
Ortho Bundle Case Management Note  Patient Details  Name: Taylor Li MRN: [id number] Date of Birth: 26-Jul-1950   - spoke with patient this week. she will discharge to home with family to assist. no DME needed. OPPT set up with [facility] discharge instructions discussed and questions answered.  Patient and MD in agreement with plan. Choice offered.                     DME Arranged:    DME Agency:     HH Arranged:    HH Agency:     Additional Comments: Please contact me with any questions of if this plan should need to change.  [name],  RN,BSN,MHA,CCM  [facility] Orthopaedic Specialist  [phone] 12/18/2022, 4:02 PM

## 2022-12-24 NOTE — Anesthesia Preprocedure Evaluation (Signed)
Anesthesia Evaluation  Patient identified by MRN, date of birth, ID band Patient awake    Reviewed: Allergy & Precautions, H&P , NPO status , Patient's Chart, lab work & pertinent test results  Airway Mallampati: II  TM Distance: >3 FB Neck ROM: Full    Dental no notable dental hx. (+) Teeth Intact, Dental Advisory Given   Pulmonary COPD, Patient abstained from smoking., former smoker   Pulmonary exam normal breath sounds clear to auscultation       Cardiovascular Exercise Tolerance: Good hypertension, Pt. on medications + CAD  + dysrhythmias Atrial Fibrillation  Rhythm:Regular Rate:Normal     Neuro/Psych   Anxiety Depression    negative neurological ROS     GI/Hepatic negative GI ROS, Neg liver ROS,,,  Endo/Other  negative endocrine ROS    Renal/GU negative Renal ROS  negative genitourinary   Musculoskeletal  (+) Arthritis , Osteoarthritis,    Abdominal   Peds  Hematology negative hematology ROS (+)   Anesthesia Other Findings   Reproductive/Obstetrics negative OB ROS                             Anesthesia Physical Anesthesia Plan  ASA: 3  Anesthesia Plan: General   Post-op Pain Management: Regional block* and Tylenol PO (pre-op)*   Induction: Intravenous  PONV Risk Score and Plan: 4 or greater and Ondansetron, Dexamethasone and Treatment may vary due to age or medical condition  Airway Management Planned: Oral ETT  Additional Equipment:   Intra-op Plan:   Post-operative Plan: Extubation in OR  Informed Consent: I have reviewed the patients History and Physical, chart, labs and discussed the procedure including the risks, benefits and alternatives for the proposed anesthesia with the patient or authorized representative who has indicated his/her understanding and acceptance.     Dental advisory given  Plan Discussed with: CRNA  Anesthesia Plan Comments:         Anesthesia Quick Evaluation

## 2022-12-25 ENCOUNTER — Ambulatory Visit (HOSPITAL_COMMUNITY): Payer: Self-pay | Admitting: Anesthesiology

## 2022-12-25 ENCOUNTER — Encounter (HOSPITAL_COMMUNITY)
Admission: RE | Disposition: A | Discharge status: Home / Self Care | Payer: Self-pay | Source: Ambulatory Visit | Attending: Orthopedic Surgery

## 2022-12-25 ENCOUNTER — Encounter (HOSPITAL_COMMUNITY): Payer: Self-pay | Admitting: Orthopedic Surgery

## 2022-12-25 ENCOUNTER — Ambulatory Visit (HOSPITAL_BASED_OUTPATIENT_CLINIC_OR_DEPARTMENT_OTHER): Payer: PPO | Admitting: Anesthesiology

## 2022-12-25 ENCOUNTER — Other Ambulatory Visit: Payer: Self-pay

## 2022-12-25 ENCOUNTER — Ambulatory Visit (HOSPITAL_COMMUNITY)
Admission: RE | Admit: 2022-12-25 | Discharge: 2022-12-25 | Disposition: A | Discharge status: Home / Self Care | Payer: PPO | Source: Ambulatory Visit | Attending: Orthopedic Surgery | Admitting: Orthopedic Surgery

## 2022-12-25 DIAGNOSIS — M25511 Pain in right shoulder: Secondary | ICD-10-CM

## 2022-12-25 DIAGNOSIS — Z79899 Other long term (current) drug therapy: Secondary | ICD-10-CM | POA: Insufficient documentation

## 2022-12-25 DIAGNOSIS — F419 Anxiety disorder, unspecified: Secondary | ICD-10-CM | POA: Insufficient documentation

## 2022-12-25 DIAGNOSIS — M75102 Unspecified rotator cuff tear or rupture of left shoulder, not specified as traumatic: Secondary | ICD-10-CM | POA: Insufficient documentation

## 2022-12-25 DIAGNOSIS — F32A Depression, unspecified: Secondary | ICD-10-CM | POA: Insufficient documentation

## 2022-12-25 DIAGNOSIS — J449 Chronic obstructive pulmonary disease, unspecified: Secondary | ICD-10-CM | POA: Diagnosis not present

## 2022-12-25 DIAGNOSIS — G8918 Other acute postprocedural pain: Secondary | ICD-10-CM | POA: Diagnosis not present

## 2022-12-25 DIAGNOSIS — I10 Essential (primary) hypertension: Secondary | ICD-10-CM | POA: Insufficient documentation

## 2022-12-25 DIAGNOSIS — Z96612 Presence of left artificial shoulder joint: Secondary | ICD-10-CM | POA: Diagnosis not present

## 2022-12-25 DIAGNOSIS — X58XXXA Exposure to other specified factors, initial encounter: Secondary | ICD-10-CM | POA: Diagnosis not present

## 2022-12-25 DIAGNOSIS — I2510 Atherosclerotic heart disease of native coronary artery without angina pectoris: Secondary | ICD-10-CM | POA: Diagnosis not present

## 2022-12-25 DIAGNOSIS — Z87891 Personal history of nicotine dependence: Secondary | ICD-10-CM | POA: Insufficient documentation

## 2022-12-25 DIAGNOSIS — I4891 Unspecified atrial fibrillation: Secondary | ICD-10-CM | POA: Diagnosis not present

## 2022-12-25 HISTORY — PX: REVERSE SHOULDER ARTHROPLASTY: SHX5054

## 2022-12-25 SURGERY — ARTHROPLASTY, SHOULDER, TOTAL, REVERSE
Anesthesia: General | Site: Shoulder | Laterality: Left

## 2022-12-25 MED ORDER — PHENYLEPHRINE HCL-NACL 20-0.9 MG/250ML-% IV SOLN
INTRAVENOUS | Status: DC | PRN
  Administered 2022-12-25: 25 ug/min via INTRAVENOUS

## 2022-12-25 MED ORDER — SUGAMMADEX SODIUM 200 MG/2ML IV SOLN
INTRAVENOUS | Status: DC | PRN
  Administered 2022-12-25: 200 mg via INTRAVENOUS

## 2022-12-25 MED ORDER — ORAL CARE MOUTH RINSE: 15.0000 mL | Freq: Once | OROMUCOSAL | Status: AC

## 2022-12-25 MED ORDER — METHOCARBAMOL 1000 MG/10ML IJ SOLN: 500.0000 mg | Freq: Four times a day (QID) | INTRAMUSCULAR | Status: DC | PRN

## 2022-12-25 MED ORDER — FENTANYL CITRATE PF 50 MCG/ML IJ SOSY
25.0000 ug | PREFILLED_SYRINGE | INTRAMUSCULAR | Status: DC | PRN
  Administered 2022-12-25 (×2): 50 ug via INTRAVENOUS

## 2022-12-25 MED ORDER — WATER FOR IRRIGATION, STERILE IR SOLN
Status: DC | PRN
  Administered 2022-12-25: 1000 mL

## 2022-12-25 MED ORDER — ONDANSETRON HCL 4 MG/2ML IJ SOLN
INTRAMUSCULAR | Status: DC | PRN
  Administered 2022-12-25: 4 mg via INTRAVENOUS

## 2022-12-25 MED ORDER — METHOCARBAMOL 500 MG PO TABS: 500.0000 mg | ORAL_TABLET | Freq: Four times a day (QID) | ORAL | Status: DC | PRN

## 2022-12-25 MED ORDER — OXYCODONE HCL 5 MG PO TABS: 5.0000 mg | ORAL_TABLET | ORAL | 0 refills | Status: DC | PRN

## 2022-12-25 MED ORDER — ROCURONIUM BROMIDE 10 MG/ML (PF) SYRINGE
PREFILLED_SYRINGE | INTRAVENOUS | Status: DC | PRN
  Administered 2022-12-25: 50 mg via INTRAVENOUS

## 2022-12-25 MED ORDER — DEXAMETHASONE SODIUM PHOSPHATE 10 MG/ML IJ SOLN
INTRAMUSCULAR | Status: DC | PRN
  Administered 2022-12-25: 8 mg via INTRAVENOUS

## 2022-12-25 MED ORDER — PHENYLEPHRINE 80 MCG/ML (10ML) SYRINGE FOR IV PUSH (FOR BLOOD PRESSURE SUPPORT)
PREFILLED_SYRINGE | INTRAVENOUS | Status: DC | PRN
  Administered 2022-12-25 (×2): 120 ug via INTRAVENOUS

## 2022-12-25 MED ORDER — FENTANYL CITRATE PF 50 MCG/ML IJ SOSY
PREFILLED_SYRINGE | INTRAMUSCULAR | Status: AC
  Filled 2022-12-25: qty 2

## 2022-12-25 MED ORDER — BUPIVACAINE LIPOSOME 1.3 % IJ SUSP
INTRAMUSCULAR | Status: DC | PRN
  Administered 2022-12-25: 10 mL via PERINEURAL

## 2022-12-25 MED ORDER — LACTATED RINGERS IV SOLN: INTRAVENOUS | Status: DC

## 2022-12-25 MED ORDER — MIDAZOLAM HCL 2 MG/2ML IJ SOLN
INTRAMUSCULAR | Status: AC
  Filled 2022-12-25: qty 2

## 2022-12-25 MED ORDER — BUPIVACAINE-EPINEPHRINE (PF) 0.5% -1:200000 IJ SOLN
INTRAMUSCULAR | Status: DC | PRN
  Administered 2022-12-25: 15 mL via PERINEURAL

## 2022-12-25 MED ORDER — MIDAZOLAM HCL 5 MG/5ML IJ SOLN
INTRAMUSCULAR | Status: DC | PRN
  Administered 2022-12-25: 2 mg via INTRAVENOUS

## 2022-12-25 MED ORDER — TRANEXAMIC ACID-NACL 1000-0.7 MG/100ML-% IV SOLN
1000.0000 mg | INTRAVENOUS | Status: AC
  Administered 2022-12-25: 1000 mg via INTRAVENOUS
  Filled 2022-12-25: qty 100

## 2022-12-25 MED ORDER — 0.9 % SODIUM CHLORIDE (POUR BTL) OPTIME
TOPICAL | Status: DC | PRN
  Administered 2022-12-25: 1000 mL

## 2022-12-25 MED ORDER — CEFAZOLIN SODIUM-DEXTROSE 2-4 GM/100ML-% IV SOLN
2.0000 g | INTRAVENOUS | Status: AC
  Administered 2022-12-25: 2 g via INTRAVENOUS
  Filled 2022-12-25: qty 100

## 2022-12-25 MED ORDER — ALUM & MAG HYDROXIDE-SIMETH 200-200-20 MG/5ML PO SUSP
15.0000 mL | Freq: Once | ORAL | Status: DC
  Filled 2022-12-25: qty 30

## 2022-12-25 MED ORDER — LIDOCAINE HCL (PF) 2 % IJ SOLN
INTRAMUSCULAR | Status: AC
  Filled 2022-12-25: qty 5

## 2022-12-25 MED ORDER — DEXAMETHASONE SODIUM PHOSPHATE 10 MG/ML IJ SOLN
INTRAMUSCULAR | Status: AC
  Filled 2022-12-25: qty 1

## 2022-12-25 MED ORDER — PROPOFOL 10 MG/ML IV BOLUS
INTRAVENOUS | Status: AC
  Filled 2022-12-25: qty 20

## 2022-12-25 MED ORDER — ROCURONIUM BROMIDE 10 MG/ML (PF) SYRINGE
PREFILLED_SYRINGE | INTRAVENOUS | Status: AC
  Filled 2022-12-25: qty 10

## 2022-12-25 MED ORDER — ONDANSETRON HCL 4 MG/2ML IJ SOLN
INTRAMUSCULAR | Status: AC
Start: 2022-12-25 — End: ?
  Filled 2022-12-25: qty 2

## 2022-12-25 MED ORDER — FENTANYL CITRATE (PF) 100 MCG/2ML IJ SOLN
INTRAMUSCULAR | Status: DC | PRN
  Administered 2022-12-25 (×3): 50 ug via INTRAVENOUS

## 2022-12-25 MED ORDER — SODIUM CHLORIDE 0.9 % IR SOLN
Status: DC | PRN
  Administered 2022-12-25: 1000 mL

## 2022-12-25 MED ORDER — LIDOCAINE 2% (20 MG/ML) 5 ML SYRINGE
INTRAMUSCULAR | Status: DC | PRN
  Administered 2022-12-25: 40 mg via INTRAVENOUS

## 2022-12-25 MED ORDER — PROPOFOL 10 MG/ML IV BOLUS
INTRAVENOUS | Status: DC | PRN
  Administered 2022-12-25: 120 mg via INTRAVENOUS

## 2022-12-25 MED ORDER — CYCLOBENZAPRINE HCL 10 MG PO TABS: 5.0000 mg | ORAL_TABLET | Freq: Three times a day (TID) | ORAL | 2 refills | Status: DC | PRN

## 2022-12-25 MED ORDER — PHENYLEPHRINE 80 MCG/ML (10ML) SYRINGE FOR IV PUSH (FOR BLOOD PRESSURE SUPPORT)
PREFILLED_SYRINGE | INTRAVENOUS | Status: AC
Start: 2022-12-25 — End: ?
  Filled 2022-12-25: qty 10

## 2022-12-25 MED ORDER — CHLORHEXIDINE GLUCONATE 0.12 % MT SOLN
15.0000 mL | Freq: Once | OROMUCOSAL | Status: AC
  Administered 2022-12-25: 15 mL via OROMUCOSAL

## 2022-12-25 MED ORDER — ACETAMINOPHEN 500 MG PO TABS
1000.0000 mg | ORAL_TABLET | Freq: Once | ORAL | Status: AC
  Administered 2022-12-25: 1000 mg via ORAL
  Filled 2022-12-25: qty 2

## 2022-12-25 MED ORDER — FENTANYL CITRATE (PF) 100 MCG/2ML IJ SOLN
INTRAMUSCULAR | Status: AC
  Filled 2022-12-25: qty 2

## 2022-12-25 SURGICAL SUPPLY — 70 items
BAG COUNTER SPONGE SURGICOUNT (BAG) IMPLANT
BAG ZIPLOCK 12X15 (MISCELLANEOUS) ×2 IMPLANT
BASEPLATE P2 COATD GLND 6.5X30 (Shoulder) IMPLANT
BIT DRILL 1.6MX128 (BIT) IMPLANT
BIT DRILL 2.5 DIA 127 CALI (BIT) IMPLANT
BIT DRILL 4 DIA CALIBRATED (BIT) IMPLANT
BLADE SAW SGTL 73X25 THK (BLADE) ×2 IMPLANT
BOOTIES KNEE HIGH SLOAN (MISCELLANEOUS) ×4 IMPLANT
COOLER ICEMAN CLASSIC (MISCELLANEOUS) ×2 IMPLANT
COVER BACK TABLE 60X90IN (DRAPES) ×2 IMPLANT
COVER SURGICAL LIGHT HANDLE (MISCELLANEOUS) ×2 IMPLANT
DRAPE INCISE IOBAN 66X45 STRL (DRAPES) ×2 IMPLANT
DRAPE POUCH INSTRU U-SHP 10X18 (DRAPES) ×2 IMPLANT
DRAPE SHEET LG 3/4 BI-LAMINATE (DRAPES) ×2 IMPLANT
DRAPE SURG 17X11 SM STRL (DRAPES) ×2 IMPLANT
DRAPE SURG ORHT 6 SPLT 77X108 (DRAPES) ×4 IMPLANT
DRAPE TOP 10253 STERILE (DRAPES) ×2 IMPLANT
DRAPE U-SHAPE 47X51 STRL (DRAPES) ×2 IMPLANT
DRSG AQUACEL AG ADV 3.5X 6 (GAUZE/BANDAGES/DRESSINGS) ×2 IMPLANT
DURAPREP 26ML APPLICATOR (WOUND CARE) ×4 IMPLANT
ELECT BLADE TIP CTD 4 INCH (ELECTRODE) ×2 IMPLANT
ELECT REM PT RETURN 15FT ADLT (MISCELLANEOUS) ×2 IMPLANT
FACESHIELD WRAPAROUND (MASK) ×1 IMPLANT
FACESHIELD WRAPAROUND OR TEAM (MASK) ×2 IMPLANT
GLOVE BIO SURGEON STRL SZ7.5 (GLOVE) ×2 IMPLANT
GLOVE BIOGEL PI IND STRL 6.5 (GLOVE) ×2 IMPLANT
GLOVE BIOGEL PI IND STRL 8 (GLOVE) ×2 IMPLANT
GLOVE SURG SS PI 6.5 STRL IVOR (GLOVE) ×2 IMPLANT
GOWN STRL REUS W/ TWL LRG LVL3 (GOWN DISPOSABLE) ×2 IMPLANT
GOWN STRL REUS W/ TWL XL LVL3 (GOWN DISPOSABLE) ×2 IMPLANT
HOOD PEEL AWAY T7 (MISCELLANEOUS) ×6 IMPLANT
INSERT SMALL SOCKET 32MM NEU (Insert) IMPLANT
KIT BASIN OR (CUSTOM PROCEDURE TRAY) ×2 IMPLANT
KIT TURNOVER KIT A (KITS) IMPLANT
MANIFOLD NEPTUNE II (INSTRUMENTS) ×2 IMPLANT
NDL TROCAR POINT SZ 2 1/2 (NEEDLE) IMPLANT
NEEDLE TROCAR POINT SZ 2 1/2 (NEEDLE) IMPLANT
NS IRRIG 1000ML POUR BTL (IV SOLUTION) ×2 IMPLANT
P2 COATDE GLNOID BSEPLT 6.5X30 (Shoulder) ×1 IMPLANT
PACK SHOULDER (CUSTOM PROCEDURE TRAY) ×2 IMPLANT
PAD COLD SHLDR WRAP-ON (PAD) ×2 IMPLANT
PROTECTOR NERVE ULNAR (MISCELLANEOUS) IMPLANT
RESTRAINT HEAD UNIVERSAL NS (MISCELLANEOUS) ×2 IMPLANT
RETRIEVER SUT HEWSON (MISCELLANEOUS) IMPLANT
SCREW BONE LOCKING RSP 5.0X30 (Screw) ×1 IMPLANT
SCREW BONE LOCKING RSP 5.0X34 (Screw) ×1 IMPLANT
SCREW BONE RSP LOCK 5X18 (Screw) IMPLANT
SCREW BONE RSP LOCK 5X30 (Screw) IMPLANT
SCREW BONE RSP LOCK 5X34 (Screw) IMPLANT
SCREW BONE RSP LOCKING 18MM LG (Screw) ×1 IMPLANT
SCREW RETAIN W/HEAD 4MM OFFSET (Shoulder) IMPLANT
SET HNDPC FAN SPRY TIP SCT (DISPOSABLE) ×2 IMPLANT
SLING ARM IMMOBILIZER LRG (SOFTGOODS) IMPLANT
SLING ARM IMMOBILIZER MED (SOFTGOODS) IMPLANT
STEM HUMERAL SM SHELL 12X48 (Stem) IMPLANT
STEM HUMERAL SMALL SHELL 12X48 (Stem) ×1 IMPLANT
STRIP CLOSURE SKIN 1/2X4 (GAUZE/BANDAGES/DRESSINGS) ×2 IMPLANT
SUCTION TUBE FRAZIER 10FR DISP (SUCTIONS) IMPLANT
SUPPORT WRAP ARM LG (MISCELLANEOUS) ×2 IMPLANT
SUT ETHIBOND 2 V 37 (SUTURE) IMPLANT
SUT FIBERWIRE #2 38 REV NDL BL (SUTURE)
SUT MNCRL AB 4-0 PS2 18 (SUTURE) ×2 IMPLANT
SUT VIC AB 2-0 CT1 TAPERPNT 27 (SUTURE) ×4 IMPLANT
SUTURE FIBERWR#2 38 REV NDL BL (SUTURE) IMPLANT
TAP SURG THRD DJ 6.5 (ORTHOPEDIC DISPOSABLE SUPPLIES) IMPLANT
TAPE LABRALWHITE 1.5X36 (TAPE) IMPLANT
TAPE SUT LABRALTAP WHT/BLK (SUTURE) IMPLANT
TOWEL OR 17X26 10 PK STRL BLUE (TOWEL DISPOSABLE) ×2 IMPLANT
TOWEL OR NON WOVEN STRL DISP B (DISPOSABLE) ×2 IMPLANT
WATER STERILE IRR 1000ML POUR (IV SOLUTION) ×2 IMPLANT

## 2022-12-25 NOTE — Op Note (Signed)
Procedure(s): REVERSE SHOULDER ARTHROPLASTY Procedure Note  Taylor Li female 72 y.o. 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 female  With left shoulder irrepairable rotator cuff tear. Pain and dysfunction interfered with quality of life and nonoperative treatment with activity modification, NSAIDS and injections failed.     Surgeon: Glennon Hamilton   Assistants: Fredia Sorrow PA-C Amber was present and scrubbed throughout the procedure and was essential in positioning, retraction, exposure, and closure)  Anesthesia: General endotracheal anesthesia with preoperative interscalene block given by the attending anesthesiologist    Procedure Detail  REVERSE SHOULDER ARTHROPLASTY   Estimated Blood Loss:  200 mL         Drains: none  Blood Given: none          Specimens: none        Complications:  * No complications entered in OR log *         Disposition: PACU - hemodynamically stable.         Condition: stable      OPERATIVE FINDINGS:  A DJO Altivate pressfit reverse total shoulder arthroplasty was placed with a  size 12 stem, a 32-4 glenosphere, and a standard-mm poly insert. The base plate  fixation was excellent.  PROCEDURE: The patient was identified in the preoperative holding area  where I personally marked the operative site after verifying site, side,  and procedure with the patient. An interscalene block given by  the attending anesthesiologist in the holding area and the patient was taken back to the operating room where all extremities were  carefully padded in position after general anesthesia was induced. She  was placed in a beach-chair position and the operative upper extremity was  prepped and draped in a standard sterile fashion. An approximately 10-  cm  incision was made from the tip of the coracoid process to the center  point of the humerus at the level of the axilla. Dissection was carried  down through subcutaneous tissues to the level of the cephalic vein  which was taken laterally with the deltoid. The pectoralis major was  retracted medially. The subdeltoid space was developed and the lateral  edge of the conjoined tendon was identified. The undersurface of  conjoined tendon was palpated and the musculocutaneous nerve was not in  the field. Retractor was placed underneath the conjoined and second  retractor was placed lateral into the deltoid. The circumflex humeral  artery and vessels were identified and clamped and coagulated. The  biceps tendon was tenotomized.  The subscapularis was chronically torn and taken down as a peel.  The  joint was then gently externally rotated while the capsule was released  from the humeral neck around to just beyond the 6 o'clock position. At  this point, the joint was dislocated and the humeral head was presented  into the wound. The excessive osteophyte formation was removed with a  large rongeur.  The cutting guide was used to make the appropriate  head cut and the head was saved for potentially bone grafting.  The glenoid was exposed with the arm in an  abducted extended position. The anterior and posterior labrum were  completely excised and the capsule was released circumferentially to  allow for exposure of the glenoid for preparation. The 2.5 mm drill was  placed using the guide in 5-10  inferior angulation and the tap was then advanced in the same hole. Small and large reamers were then used. The tap was then removed and the Metaglene was then screwed in with excellent purchase.  The peripheral guide was then used to drilled measured and filled peripheral locking screws. The size 32-4 glenosphere was then impacted on the [facility] taper and the central screw was placed. The humerus was then again  exposed and the diaphyseal reamers were used followed by the metaphyseal reamers. The final broach was left in place in the proximal trial was placed. The joint was reduced and with this implant it was felt that soft tissue tensioning was appropriate with excellent stability and excellent range of motion. Therefore, final humeral stem was placed press-fit.  And then the trial polyethylene inserts were tested again and the above implant was felt to be the most appropriate for final insertion. The joint was reduced taken through full range of motion and felt to be stable. Soft tissue tension was appropriate.  The joint was then copiously irrigated with pulse  lavage and the wound was then closed. The subscapularis was not repaired.  Skin was closed with 2-0 Vicryl in a deep dermal layer and 4-0  Monocryl for skin closure. Steri-Strips were applied. Sterile  dressings were then applied as well as a sling. The patient was allowed  to awaken from general anesthesia, transferred to stretcher, and taken  to recovery room in stable condition.   POSTOPERATIVE PLAN: The patient will be observed in the recovery room and if pain is well-controlled with the regional anesthesia and she is hemodynamically stable she can be discharged home today with family.

## 2022-12-25 NOTE — Transfer of Care (Signed)
Immediate Anesthesia Transfer of Care Note  Patient: Taylor Li  Procedure(s) Performed: REVERSE SHOULDER ARTHROPLASTY (Left: Shoulder)  Patient Location: PACU  Anesthesia Type:General  Level of Consciousness: awake, alert , oriented, and patient cooperative  Airway & Oxygen Therapy: Patient Spontanous Breathing and Patient connected to face mask oxygen  Post-op Assessment: Report given to RN, Post -op Vital signs reviewed and stable, and Patient moving all extremities  Post vital signs: Reviewed and stable  Last Vitals:  Vitals Value Taken Time  BP 114/71 12/25/22 0840  Temp    Pulse 76 12/25/22 0843  Resp 21 12/25/22 0843  SpO2 100 % 12/25/22 0843  Vitals shown include unfiled device data.  Last Pain:  Vitals:   12/25/22 0628  TempSrc:   PainSc: 3       Patients Stated Pain Goal: 6 (12/25/22 1610)  Complications: No notable events documented.

## 2022-12-25 NOTE — H&P (Signed)
Taylor Li is an 72 y.o. female.   Chief Complaint: L shoulder pain and dysfunction HPI: L shoulder chronic irreparable RCT.  Constant pain weakness, failed conservative treatment.  Past Medical History:  Diagnosis Date   A-fib (HCC)    Arthritis    C. difficile diarrhea    Cancer (HCC)    skin cancer on nose   Coronary artery disease    Depression    Dysrhythmia    A.fib- had ablation and 2 cardioversions   Hepatitis    Hep A as a child   History of kidney stones    Hyperlipidemia    Hypertension    Personal history of colonic polyp- adenoma 09/22/2013   Pneumonia    Pre-diabetes    Pre-invasive breast cancer    had Bilateral mastectomies   Rectal prolapse    Vitamin D deficiency     Past Surgical History:  Procedure Laterality Date   APPENDECTOMY     ATRIAL FIBRILLATION ABLATION N/A 05/06/2022   Procedure: ATRIAL FIBRILLATION ABLATION;  Surgeon: [name], MD;  Location: [facility] INVASIVE CV LAB;  Service: Cardiovascular;  Laterality: N/A;   BREAST SURGERY Bilateral 1983   SQ mastectomies   CARDIOVERSION     CARDIOVERSION N/A 12/13/2021   Procedure: CARDIOVERSION;  Surgeon: [name], MD;  Location: [facility] ENDOSCOPY;  Service: Cardiovascular;  Laterality: N/A;   CHOLECYSTECTOMY     laparoscopic   COLONOSCOPY     DIAGNOSTIC LAPAROSCOPY  1989   Diagnostic then lysis of adhesions   LAMINECTOMY  1982   L4-L5   NASAL SEPTUM SURGERY     TEE WITHOUT CARDIOVERSION N/A 12/13/2021   Procedure: TRANSESOPHAGEAL ECHOCARDIOGRAM (TEE);  Surgeon: [name], MD;  Location: [facility] ENDOSCOPY;  Service: Cardiovascular;  Laterality: N/A;   TONSILLECTOMY     VAGINAL HYSTERECTOMY  1985    Family History  Problem Relation Age of Onset   Hypertension Mother    Barrett's esophagus Mother    Mitral valve prolapse Mother    Atrial fibrillation Mother    Hypertension Father    Lung cancer Father    Thyroid disease Father    Clotting disorder Father    Colon  cancer Neg Hx    Diabetes Neg Hx    Esophageal cancer Neg Hx    Pancreatic cancer Neg Hx    Stomach cancer Neg Hx    Social History:  reports that she quit smoking about 10 years ago. Her smoking use included cigarettes and e-cigarettes. She has been exposed to tobacco smoke. She has never used smokeless tobacco. She reports current alcohol use. She reports that she does not use drugs.  Allergies:  Allergies  Allergen Reactions   Rosuvastatin     myalgias   Other Itching and Rash    Fresh green peppers    Medications Prior to Admission  Medication Sig Dispense Refill   acetaminophen (TYLENOL) 650 MG CR tablet Take 1,300 mg by mouth every 8 (eight) hours as needed for pain.     ALPRAZolam (XANAX) 1 MG tablet TAKE 1/2-1 TABLET BY MOUTH AT BEDTIME (Patient taking differently: Take 0.5-1 mg by mouth at bedtime as needed for sleep.) 20 tablet 0   apixaban (ELIQUIS) 5 MG TABS tablet Take 1 tablet (5 mg total) by mouth 2 (two) times daily. 180 tablet 3   Ascorbic Acid (VITAMIN C) 1000 MG tablet Take 1,000 mg by mouth at bedtime.     Cholecalciferol (DIALYVITE VITAMIN D 5000) 125 MCG (5000 UT)  capsule Take 10,000 Units by mouth at bedtime.     cyclobenzaprine (FLEXERIL) 10 MG tablet TAKE 1/2 TO 1 TABLET (5-10 MG TOTAL) BY MOUTH EVERY DAY AT BEDTIME 30 tablet 2   Evolocumab (REPATHA SURECLICK) 140 MG/ML SOAJ Inject 140 mg into the skin every 14 (fourteen) days. 6 mL 3   ezetimibe (ZETIA) 10 MG tablet TAKE 1 TABLET BY MOUTH EVERY DAY FOR CHOLESTEROL 90 tablet 3   gabapentin (NEURONTIN) 100 MG capsule TAKE 1 TO 3 CAPSULES 1 TO 2 HOURS BEFORE BEDTIME AS NEEDED FOR SLEEP (Patient taking differently: Take 300 mg by mouth at bedtime.) 90 capsule 3   Magnesium 500 MG TABS Take 500-1,000 mg by mouth at bedtime.     OVER THE COUNTER MEDICATION Apply 1 application  topically daily as needed (Pain). [name]     saccharomyces boulardii (FLORASTOR) 250 MG capsule Take 250 mg by mouth daily.      telmisartan (MICARDIS) 40 MG tablet Take 1 tablet (40 mg total) by mouth daily. 90 tablet 3   Zinc 50 MG TABS Take 50 mg by mouth every other day. At bedtime     diltiazem (CARDIZEM) 30 MG tablet Take 1 tablet every 4 hours AS NEEDED for AFIB heart rate >100 as long as top BP >100. 30 tablet 1    No results found for this or any previous visit (from the past 48 hours). No results found.  Review of Systems  All other systems reviewed and are negative.   Blood pressure (!) 130/97, pulse 82, temperature 97.8 F (36.6 C), temperature source Oral, resp. rate 18, SpO2 93%. Physical Exam HENT:     Head: Atraumatic.  Cardiovascular:     Pulses: Normal pulses.  Pulmonary:     Effort: Pulmonary effort is normal.  Musculoskeletal:     Comments: Left shoulder pain with limited ROM. NVID  Skin:    General: Skin is warm.  Neurological:     Mental Status: She is alert.      Assessment/Plan L shoulder chronic irreparable RCT.  Constant pain weakness, failed conservative treatment. Plan L reverse TSA Risks / benefits of surgery discussed Consent on chart  NPO for OR Preop antibiotics   Glennon Hamilton, MD 12/25/2022, 6:24 AM

## 2022-12-25 NOTE — Evaluation (Signed)
Occupational Therapy Evaluation Patient Details Name: Taylor Li MRN: [id number] DOB: 08/03/1950 Today's Date: 12/25/2022   History of Present Illness Pt is a 72 y/o female admitted for reverse shoulder arthroplasty in setting of chronic irreparable rotator cuff tear. PMH: a fib, arthritis, CAD, HTN, HLD, breast CA with B mastectomies   Clinical Impression   PTA, pt lives with spouse and typically completely Independent in all daily tasks without AD. Pt presents now s/p sx above with nerve block still in effect. Educated re: shoulder precautions for ADLs, sling mgmt, iceman, allowed exercises and positioning strategies. Pt's husband present, able to provide Min A for UB ADL and pt able to manage LB ADLs with Modified Independence. Provided handouts to maximize education carryover. Recommend postacute rehab follow up per surgeon's protocol.        If plan is discharge home, recommend the following: A little help with bathing/dressing/bathroom;Assistance with cooking/housework;Assist for transportation    Functional Status Assessment  Patient has had a recent decline in their functional status and demonstrates the ability to make significant improvements in function in a reasonable and predictable amount of time.  Equipment Recommendations  None recommended by OT    Recommendations for Other Services       Precautions / Restrictions Precautions Precautions: Shoulder Shoulder Interventions: Shoulder sling/immobilizer;Off for dressing/bathing/exercises;At all times Precaution Booklet Issued: Yes (comment) Precaution Comments: No P/AROM of L shoulder Restrictions Weight Bearing Restrictions Per Provider Order: Yes LUE Weight Bearing Per Provider Order: Non weight bearing      Mobility Bed Mobility               General bed mobility comments: in recliner on entry    Transfers Overall transfer level: Independent Equipment used: None                       Balance Overall balance assessment: No apparent balance deficits (not formally assessed)                                         ADL either performed or assessed with clinical judgement   ADL Overall ADL's : Needs assistance/impaired Eating/Feeding: Independent   Grooming: Modified independent   Upper Body Bathing: Minimal assistance   Lower Body Bathing: Modified independent   Upper Body Dressing : Minimal assistance Upper Body Dressing Details (indicate cue type and reason): pt bought adaptive shirts (buttons down sleeve and sides for sx). assist with LUE mgmt into sleeve and sling mgmt w/ husband present and assisting as well Lower Body Dressing: Modified independent Lower Body Dressing Details (indicate cue type and reason): able to don underwear/pants without assist Toilet Transfer: Modified Independent   Toileting- Clothing Manipulation and Hygiene: Modified independent         General ADL Comments: Educated re: dressing techniques for UB/LB, bathing and underarm care with UE supported in sitting (encouraged purchase of shower chair), allowed exercises, sling positioning and sleep positions. Collaborated on iceman machine use     Vision Baseline Vision/History: 1 Wears glasses Ability to See in Adequate Light: 0 Adequate Patient Visual Report: No change from baseline Vision Assessment?: No apparent visual deficits     Perception         Praxis         Pertinent Vitals/Pain Pain Assessment Pain Assessment: No/denies pain     Extremity/Trunk Assessment Upper Extremity Assessment Upper  Extremity Assessment: Right hand dominant;LUE deficits/detail LUE Deficits / Details: s/p shoulder replacement, nerve block in effect, no sensation in UE. some movement in digits   Lower Extremity Assessment Lower Extremity Assessment: Overall WFL for tasks assessed   Cervical / Trunk Assessment Cervical / Trunk Assessment: Normal   Communication  Communication Communication: No apparent difficulties   Cognition Arousal: Alert Behavior During Therapy: WFL for tasks assessed/performed Overall Cognitive Status: Within Functional Limits for tasks assessed                                       General Comments  husband at bedside    Exercises     Shoulder Instructions      Home Living Family/patient expects to be discharged to:: Private residence Living Arrangements: Spouse/significant other Available Help at Discharge: Family Type of Home: House Home Access: Stairs to enter Secretary/administrator of Steps: 2 Entrance Stairs-Rails: Right;Left Home Layout: One level     Bathroom Shower/Tub: Chief Strategy Officer: Standard     Home Equipment: None          Prior Functioning/Environment Prior Level of Function : Independent/Modified Independent                        OT Problem List:        OT Treatment/Interventions:      OT Goals(Current goals can be found in the care plan section) Acute Rehab OT Goals Patient Stated Goal: home and recover well OT Goal Formulation: All assessment and education complete, DC therapy  OT Frequency:      Co-evaluation              AM-PAC OT "6 Clicks" Daily Activity     Outcome Measure Help from another person eating meals?: None Help from another person taking care of personal grooming?: None Help from another person toileting, which includes using toliet, bedpan, or urinal?: None Help from another person bathing (including washing, rinsing, drying)?: A Little Help from another person to put on and taking off regular upper body clothing?: A Little Help from another person to put on and taking off regular lower body clothing?: None 6 Click Score: 22   End of Session Equipment Utilized During Treatment: Other (comment) (sling) Nurse Communication: Mobility status  Activity Tolerance: Patient tolerated treatment well Patient  left: in chair;with family/visitor present  OT Visit Diagnosis: Muscle weakness (generalized) (M62.81)                Time: 1610-9604 OT Time Calculation (min): 39 min Charges:  OT General Charges $OT Visit: 1 Visit OT Evaluation $OT Eval Low Complexity: 1 Low OT Treatments $Self Care/Home Management : 23-37 mins  Bradd Canary, OTR/L Acute Rehab Services Office: [phone]   Lorre Munroe 12/25/2022, 11:33 AM

## 2022-12-25 NOTE — Anesthesia Postprocedure Evaluation (Signed)
Anesthesia Post Note  Patient: Taylor Li  Procedure(s) Performed: REVERSE SHOULDER ARTHROPLASTY (Left: Shoulder)     Patient location during evaluation: PACU Anesthesia Type: General and Regional Level of consciousness: awake and alert Pain management: pain level controlled Vital Signs Assessment: post-procedure vital signs reviewed and stable Respiratory status: spontaneous breathing, nonlabored ventilation and respiratory function stable Cardiovascular status: blood pressure returned to baseline and stable Postop Assessment: no apparent nausea or vomiting Anesthetic complications: no  No notable events documented.  Last Vitals:  Vitals:   12/25/22 1000 12/25/22 1014  BP: 102/73 105/71  Pulse: 79 87  Resp: 12 13  Temp: (!) 36.4 C 36.4 C  SpO2: 93% 92%    Last Pain:  Vitals:   12/25/22 1014  TempSrc:   PainSc: 0-No pain                 Tahj Njoku,W. EDMOND

## 2022-12-25 NOTE — Anesthesia Procedure Notes (Signed)
Anesthesia Regional Block: Interscalene brachial plexus block   Pre-Anesthetic Checklist: , timeout performed,  Correct Patient, Correct Site, Correct Laterality,  Correct Procedure, Correct Position, site marked,  Risks and benefits discussed,  Pre-op evaluation,  At surgeon's request and post-op pain management  Laterality: Left  Prep: Maximum Sterile Barrier Precautions used, chloraprep       Needles:  Injection technique: Single-shot  Needle Type: Echogenic Stimulator Needle     Needle Length: 5cm  Needle Gauge: 22     Additional Needles:   Procedures:, nerve stimulator,,, ultrasound used (permanent image in chart),,     Nerve Stimulator or Paresthesia:  Response: Biceps response  Additional Responses:   Narrative:  Start time: 12/25/2022 6:45 AM End time: 12/25/2022 6:55 AM Injection made incrementally with aspirations every 5 mL.  Performed by: Personally  Anesthesiologist: Gaynelle Adu, MD  Additional Notes:

## 2022-12-25 NOTE — Anesthesia Procedure Notes (Signed)
Procedure Name: Intubation Date/Time: 12/25/2022 7:29 AM  Performed by: Elisabeth Cara, CRNAPre-anesthesia Checklist: Patient identified, Emergency Drugs available, Suction available, Patient being monitored and Timeout performed Patient Re-evaluated:Patient Re-evaluated prior to induction Oxygen Delivery Method: Circle system utilized Preoxygenation: Pre-oxygenation with 100% oxygen Induction Type: IV induction Ventilation: Mask ventilation without difficulty Laryngoscope Size: Mac and 4 Grade View: Grade I Tube type: Oral Tube size: 7.5 mm Number of attempts: 1 Airway Equipment and Method: Stylet Placement Confirmation: ETT inserted through vocal cords under direct vision, positive ETCO2 and breath sounds checked- equal and bilateral Secured at: 23 cm Tube secured with: Tape Dental Injury: Teeth and Oropharynx as per pre-operative assessment

## 2022-12-25 NOTE — Discharge Instructions (Addendum)
Discharge Instructions after Reverse Total Shoulder Arthroplasty   A sling has been provided for you. You are to wear this at all times (except for bathing and dressing), until your first post operative visit with Dr. Ave Filter. Please also wear while sleeping at night. While you bath and dress, let the arm/elbow extend straight down to stretch your elbow. Wiggle your fingers and pump your first while your in the sling to prevent hand swelling. Use ice on the shoulder intermittently over the first 48 hours after surgery. Continue to use ice or and ice machine as needed after 48 hours for pain control/swelling.  Pain medicine has been prescribed for you.  Use your medicine liberally over the first 48 hours, and then you can begin to taper your use. You may take Extra Strength Tylenol or Tylenol only in place of the pain pills. DO NOT take ANY nonsteroidal anti-inflammatory pain medications: Advil, Motrin, Ibuprofen, Aleve, Naproxen or Naprosyn.  Take one aspirin a day for 2 weeks after surgery, unless you have an aspirin sensitivity/allergy or asthma.  Leave your dressing on until your first follow up visit.  You may shower with the dressing.  Hold your arm as if you still have your sling on while you shower. Simply allow the water to wash over the site and then pat dry. Make sure your axilla (armpit) is completely dry after showering. DO NOT PUT FINGERS IN LOOPS OF SLING!    Please call [phone] during normal business hours or [phone] after hours for any problems. Including the following:  - excessive redness of the incisions - drainage for more than 4 days - fever of more than 101.5 F  *Please note that pain medications will not be refilled after hours or on weekends. Dental Antibiotics:  In most cases prophylactic antibiotics for Dental procdeures after total joint surgery are not necessary.  Exceptions are as follows:  1. History of prior total joint infection  2. Severely  immunocompromised (Organ Transplant, cancer chemotherapy, Rheumatoid biologic meds such as Humera)  3. Poorly controlled diabetes (A1C &gt; 8.0, blood glucose over 200)  If you have one of these conditions, contact your surgeon for an antibiotic prescription, prior to your dental procedure.

## 2022-12-26 ENCOUNTER — Encounter (HOSPITAL_COMMUNITY): Payer: Self-pay | Admitting: Orthopedic Surgery

## 2023-01-09 DIAGNOSIS — M75102 Unspecified rotator cuff tear or rupture of left shoulder, not specified as traumatic: Secondary | ICD-10-CM | POA: Diagnosis not present

## 2023-01-09 DIAGNOSIS — Z96612 Presence of left artificial shoulder joint: Secondary | ICD-10-CM | POA: Diagnosis not present

## 2023-01-09 DIAGNOSIS — M25612 Stiffness of left shoulder, not elsewhere classified: Secondary | ICD-10-CM | POA: Diagnosis not present

## 2023-01-13 DIAGNOSIS — Z96612 Presence of left artificial shoulder joint: Secondary | ICD-10-CM | POA: Diagnosis not present

## 2023-01-13 DIAGNOSIS — M25612 Stiffness of left shoulder, not elsewhere classified: Secondary | ICD-10-CM | POA: Diagnosis not present

## 2023-01-15 ENCOUNTER — Other Ambulatory Visit: Payer: Self-pay | Admitting: Nurse Practitioner

## 2023-01-15 DIAGNOSIS — F411 Generalized anxiety disorder: Secondary | ICD-10-CM

## 2023-01-20 DIAGNOSIS — M25612 Stiffness of left shoulder, not elsewhere classified: Secondary | ICD-10-CM | POA: Diagnosis not present

## 2023-01-20 DIAGNOSIS — Z96612 Presence of left artificial shoulder joint: Secondary | ICD-10-CM | POA: Diagnosis not present

## 2023-01-22 ENCOUNTER — Encounter: Payer: Self-pay | Admitting: Internal Medicine

## 2023-01-22 ENCOUNTER — Other Ambulatory Visit: Payer: Self-pay | Admitting: Internal Medicine

## 2023-01-22 DIAGNOSIS — M25511 Pain in right shoulder: Secondary | ICD-10-CM

## 2023-01-22 DIAGNOSIS — Z96612 Presence of left artificial shoulder joint: Secondary | ICD-10-CM | POA: Diagnosis not present

## 2023-01-22 DIAGNOSIS — M25612 Stiffness of left shoulder, not elsewhere classified: Secondary | ICD-10-CM | POA: Diagnosis not present

## 2023-01-22 MED ORDER — CYCLOBENZAPRINE HCL 10 MG PO TABS: ORAL_TABLET | ORAL | 3 refills | Status: DC

## 2023-02-02 ENCOUNTER — Other Ambulatory Visit: Payer: Self-pay | Admitting: Physician Assistant

## 2023-02-02 DIAGNOSIS — I480 Paroxysmal atrial fibrillation: Secondary | ICD-10-CM

## 2023-02-02 DIAGNOSIS — I4891 Unspecified atrial fibrillation: Secondary | ICD-10-CM

## 2023-02-03 ENCOUNTER — Telehealth: Payer: Self-pay | Admitting: Pharmacy Technician

## 2023-02-03 ENCOUNTER — Other Ambulatory Visit (HOSPITAL_COMMUNITY): Payer: Self-pay

## 2023-02-03 ENCOUNTER — Telehealth: Payer: Self-pay | Admitting: Cardiovascular Disease

## 2023-02-03 ENCOUNTER — Telehealth: Payer: Self-pay | Admitting: Pharmacist

## 2023-02-03 NOTE — Telephone Encounter (Signed)
Pt called requesting HW g[employer] co-pay card info   Info shared with pt over phone    Card No. [id number]  BIN [id number]  PCN [id number]  PC Group [id number]  [employer] ID [id number]  End Date 05/05/2023  [facility] Balance $[id number]

## 2023-02-03 NOTE — Telephone Encounter (Signed)
Pt c/o medication issue:  1. Name of Medication: Evolocumab (REPATHA SURECLICK) 140 MG/ML SOAJ   2. How are you currently taking this medication (dosage and times per day)?    3. Are you having a reaction (difficulty breathing--STAT)? no  4. What is your medication issue? Prior Berkley Harvey is needed. Please advise

## 2023-02-03 NOTE — Telephone Encounter (Signed)
Pharmacy Patient Advocate Encounter   Received notification from Pt Calls Messages that prior authorization for repatha is required/requested.   Insurance verification completed.   The patient is insured through [facility] ADVANTAGE/RX ADVANCE .   Per test claim: PA required; PA submitted to above mentioned insurance via CoverMyMeds Key/confirmation #/EOC [id number] Status is pending

## 2023-02-03 NOTE — Telephone Encounter (Signed)
Prescription refill request for Eliquis received. Indication:afib Last office visit:11/24 Scr:0.69  12/24 Age: 73 Weight:86.6  kg  Prescription refilled

## 2023-02-03 NOTE — Telephone Encounter (Signed)
Called and spoke to patient and patient verbalized she has 2 prefilled injection left. Patient request  prior authorization for next refill.

## 2023-02-04 ENCOUNTER — Ambulatory Visit: Payer: PPO | Admitting: Internal Medicine

## 2023-02-05 ENCOUNTER — Other Ambulatory Visit (HOSPITAL_COMMUNITY): Payer: Self-pay

## 2023-02-05 ENCOUNTER — Telehealth: Payer: Self-pay | Admitting: Pharmacy Technician

## 2023-02-05 NOTE — Telephone Encounter (Signed)
Insurance requested more information, more information gave over the phone.

## 2023-02-05 NOTE — Telephone Encounter (Signed)
Insurance faxed requesting more information. I called and gave them more information

## 2023-02-05 NOTE — Telephone Encounter (Signed)
Pharmacy Patient Advocate Encounter  Received notification from [facility] ADVANTAGE/RX ADVANCE that Prior Authorization for repatha has been APPROVED from 02/05/23 to 02/05/24. Ran test claim, Copay is $117.50 3 months. This test claim was processed through [facility]- copay amounts may vary at other pharmacies due to pharmacy/plan contracts, or as the patient moves through the different stages of their insurance plan.   PA #/Case ID/Reference #: [id number]

## 2023-02-06 ENCOUNTER — Encounter: Payer: Self-pay | Admitting: Nurse Practitioner

## 2023-02-06 ENCOUNTER — Other Ambulatory Visit (HOSPITAL_COMMUNITY): Payer: Self-pay

## 2023-02-06 NOTE — Telephone Encounter (Signed)
Called and spoke to patient and patient verbalized she has a Engineer, manufacturing provided from Bernie[employer] Salines until April 2025. Patient made aware that prior authorization for Repatha has been approved. Patient verbalized she will utilize grant and notify us if she would like to utilize [employer].

## 2023-02-09 ENCOUNTER — Ambulatory Visit: Payer: PPO | Admitting: Internal Medicine

## 2023-02-09 ENCOUNTER — Other Ambulatory Visit: Payer: Self-pay | Admitting: Nurse Practitioner

## 2023-02-09 DIAGNOSIS — M25511 Pain in right shoulder: Secondary | ICD-10-CM

## 2023-02-09 MED ORDER — CYCLOBENZAPRINE HCL 10 MG PO TABS: ORAL_TABLET | ORAL | 3 refills | Status: DC

## 2023-02-11 MED ORDER — TRAZODONE HCL 50 MG PO TABS: 50.0000 mg | ORAL_TABLET | Freq: Every day | ORAL | 2 refills | Status: DC

## 2023-02-12 ENCOUNTER — Ambulatory Visit (HOSPITAL_COMMUNITY): Payer: PPO | Admitting: Physician Assistant

## 2023-02-12 NOTE — Progress Notes (Incomplete)
Primary Care Physician: [name], MD Primary Cardiologist: Dr [name] Primary Electrophysiologist: Dr [name] Referring Physician: Dr [name] is a 73 y.o. female with a history of HTN, HLD, thoracic aortic aneurysm, atrial fibrillation who presents for follow up in the [facility].  She is status post TEE cardioversion 12/13/2021. She developed COVID in October and had been fatigued since then. She also has a history of Mobitz 1 AV block and thus rate control has not been started.  She presented to the emergency room with palpitations 12/17/21. She was given adenosine which apparently converted her to sinus rhythm. It was thought that this was all atrial fibrillation and atrial flutter. She was seen by Dr [name] and started on amiodarone as a bridge to ablation. Patient is on Eliquis for a CHADS2VASC score of 4.   Patient reached out via Mychart with elevated heart rates, referred to AF clinic to consider DCCV however, she chemically converted to SR.   On follow up today, ***patient is s/p afib ablation with Dr [name] on 05/06/22. She reports that since the ablation she has felt "50/50" with good days and bad days. She has had some elevated heart rates and taken PRN CCB. She discontinued amiodarone after her ablation. However, over the past week she has also been very fatigued and had some nausea, no vomiting. No fever or chills. She is in SR today. She denies swallowing pain or groin issues. She is pending a cardiac PET scan per Tereso Newcomer.   Today, he denies symptoms of ***palpitations, chest pain, orthopnea, PND, lower extremity edema, dizziness, presyncope, syncope, snoring, daytime somnolence, bleeding, or neurologic sequela. The patient is tolerating medications without difficulties and is otherwise without complaint today.    Atrial Fibrillation Risk Factors:  she does not have symptoms or diagnosis of sleep apnea. she does not have a  history of rheumatic fever.   Atrial Fibrillation Management history:  Previous antiarrhythmic drugs: amiodarone  Previous cardioversions: 12/13/21 Previous ablations: 05/06/22 Anticoagulation history: Eliquis   Past Medical History:  Diagnosis Date   A-fib (HCC)    Arthritis    C. difficile diarrhea    Cancer (HCC)    skin cancer on nose   Coronary artery disease    Depression    Dysrhythmia    A.fib- had ablation and 2 cardioversions   Hepatitis    Hep A as a child   History of kidney stones    Hyperlipidemia    Hypertension    Personal history of colonic polyp- adenoma 09/22/2013   Pneumonia    Pre-diabetes    Pre-invasive breast cancer    had Bilateral mastectomies   Rectal prolapse    Vitamin D deficiency     Current Outpatient Medications  Medication Sig Dispense Refill   acetaminophen (TYLENOL) 650 MG CR tablet Take 1,300 mg by mouth every 8 (eight) hours as needed for pain.     ALPRAZolam (XANAX) 1 MG tablet TAKE 1/2-1 TABLET BY MOUTH AT BEDTIME (Patient taking differently: Take 0.5-1 mg by mouth at bedtime as needed for sleep.) 20 tablet 0   Ascorbic Acid (VITAMIN C) 1000 MG tablet Take 1,000 mg by mouth at bedtime.     Cholecalciferol (DIALYVITE VITAMIN D 5000) 125 MCG (5000 UT) capsule Take 10,000 Units by mouth at bedtime.     cyclobenzaprine (FLEXERIL) 10 MG tablet Take  1 tablet   3 x / day  for Muscle Spasms 90 tablet 3  diltiazem (CARDIZEM) 30 MG tablet Take 1 tablet every 4 hours AS NEEDED for AFIB heart rate >100 as long as top BP >100. 30 tablet 1   ELIQUIS 5 MG TABS tablet TAKE 1 TABLET BY MOUTH TWICE A DAY 180 tablet 3   Evolocumab (REPATHA SURECLICK) 140 MG/ML SOAJ Inject 140 mg into the skin every 14 (fourteen) days. 6 mL 3   ezetimibe (ZETIA) 10 MG tablet TAKE 1 TABLET BY MOUTH EVERY DAY FOR CHOLESTEROL 90 tablet 3   gabapentin (NEURONTIN) 100 MG capsule TAKE 1 TO 3 CAPSULES 1 TO 2 HOURS BEFORE BEDTIME AS NEEDED FOR SLEEP (Patient taking  differently: Take 300 mg by mouth at bedtime.) 90 capsule 3   Magnesium 500 MG TABS Take 500-1,000 mg by mouth at bedtime.     OVER THE COUNTER MEDICATION Apply 1 application  topically daily as needed (Pain). [name]     oxyCODONE (ROXICODONE) 5 MG immediate release tablet Take 1 tablet (5 mg total) by mouth every 4 (four) hours as needed for severe pain (pain score 7-10). 30 tablet 0   saccharomyces boulardii (FLORASTOR) 250 MG capsule Take 250 mg by mouth daily.     telmisartan (MICARDIS) 40 MG tablet Take 1 tablet (40 mg total) by mouth daily. 90 tablet 3   traZODone (DESYREL) 50 MG tablet Take 1 tablet (50 mg total) by mouth at bedtime. 30 tablet 2   Zinc 50 MG TABS Take 50 mg by mouth every other day. At bedtime     No current facility-administered medications for this visit.    ROS- All systems are reviewed and negative except as per the HPI above.  Physical Exam: There were no vitals filed for this visit.   GEN: Well nourished, well developed in no acute distress NECK: No JVD; No carotid bruits CARDIAC: {EPRHYTHM:28826}, no murmurs, rubs, gallops RESPIRATORY:  Clear to auscultation without rales, wheezing or rhonchi  ABDOMEN: Soft, non-tender, non-distended EXTREMITIES:  No edema; No deformity    Wt Readings from Last 3 Encounters:  12/25/22 86.6 kg  12/17/22 86.6 kg  11/24/22 89.3 kg    EKG today demonstrates  ***   Echo 08/20/22 demonstrated   1. Left ventricular ejection fraction, by estimation, is 50 to 55%. The  left ventricle has low normal function. The left ventricle has no regional  wall motion abnormalities. There is mild concentric left ventricular  hypertrophy. Left ventricular diastolic parameters are consistent with Grade I diastolic dysfunction (impaired relaxation).   2. Right ventricular systolic function is normal. The right ventricular  size is normal.   3. The mitral valve is normal in structure. No evidence of mitral valve  regurgitation.  No evidence of mitral stenosis.   4. The aortic valve is normal in structure. Aortic valve regurgitation is  not visualized. Aortic valve sclerosis is present, with no evidence of  aortic valve stenosis.   5. The aortic arch is dilated. Aneurysm of the ascending aorta, measuring  47 mm.   6. The inferior vena cava is normal in size with greater than 50%  respiratory variability, suggesting right atrial pressure of 3 mmHg.   Comparison(s): Compared to prior study in 12/2021 ascending aorta  dilatation has worsened was 45mm now 47mm.   Conclusion(s)/Recommendation(s): Due to ascending aorta and aortic arch  dilatation recommend further testing with Chest CTA or MRA.   Epic records are reviewed at length today  CHA2DS2-VASc Score =    The patient's score is based upon:    {  Confirm score is correct.  If not, click here to update score.  REFRESH note.  :1}    ASSESSMENT AND PLAN: {Select the correct AFib Diagnosis                 :[id number]}      ASSESSMENT AND PLAN: 1. Persistent Atrial Fibrillation (ICD10:  I48.19) The patient's CHA2DS2-VASc score is  , indicating a  % annual risk of stroke.   S/p afib ablation 05/06/22, off amiodarone. Patient in SR today but has had some breakthrough afib, reassured that this is not uncommon for the first 3 months post ablation.  Will avoid scheduled rate control with her history of prolonged PR and Mobitz I AV block. Continue diltiazem 30 mg PRN q 4 hours for heart racing.  Continue Eliquis 5 mg BID with no missed doses for 3 months post ablation.   2. Secondary Hypercoagulable State (ICD10:  D68.69) The patient is at significant risk for stroke/thromboembolism based upon her CHA2DS2-VASc Score of  .  Continue Apixaban (Eliquis).   HTN Stable on current regimen ***  CAD PET showed evidence of ischemia FFR negative 08/2022 No anginal symptoms Followed by Dr [name] ***      Follow up ***   Jorja Loa PA-C Afib Clinic [facility] [address] [place], [place] [address] [phone] 02/12/2023 10:17 AM

## 2023-02-13 ENCOUNTER — Encounter: Payer: Self-pay | Admitting: Nurse Practitioner

## 2023-02-23 ENCOUNTER — Encounter: Payer: Self-pay | Admitting: Internal Medicine

## 2023-02-24 ENCOUNTER — Encounter: Payer: Self-pay | Admitting: Internal Medicine

## 2023-03-02 ENCOUNTER — Ambulatory Visit: Payer: PPO | Admitting: Cardiovascular Disease

## 2023-03-03 DIAGNOSIS — M25612 Stiffness of left shoulder, not elsewhere classified: Secondary | ICD-10-CM | POA: Diagnosis not present

## 2023-03-03 DIAGNOSIS — Z96612 Presence of left artificial shoulder joint: Secondary | ICD-10-CM | POA: Diagnosis not present

## 2023-03-12 ENCOUNTER — Ambulatory Visit (HOSPITAL_COMMUNITY)
Admission: RE | Admit: 2023-03-12 | Discharge: 2023-03-12 | Disposition: A | Discharge status: Home / Self Care | Payer: PPO | Source: Ambulatory Visit | Attending: Physician Assistant | Admitting: Physician Assistant

## 2023-03-12 ENCOUNTER — Encounter (HOSPITAL_COMMUNITY): Payer: Self-pay | Admitting: Physician Assistant

## 2023-03-12 VITALS — BP 138/88 | HR 77 | Ht 68.0 in | Wt 197.0 lb

## 2023-03-12 DIAGNOSIS — Z79899 Other long term (current) drug therapy: Secondary | ICD-10-CM | POA: Insufficient documentation

## 2023-03-12 DIAGNOSIS — Z8679 Personal history of other diseases of the circulatory system: Secondary | ICD-10-CM | POA: Insufficient documentation

## 2023-03-12 DIAGNOSIS — I712 Thoracic aortic aneurysm, without rupture, unspecified: Secondary | ICD-10-CM | POA: Diagnosis not present

## 2023-03-12 DIAGNOSIS — I2510 Atherosclerotic heart disease of native coronary artery without angina pectoris: Secondary | ICD-10-CM | POA: Insufficient documentation

## 2023-03-12 DIAGNOSIS — E785 Hyperlipidemia, unspecified: Secondary | ICD-10-CM | POA: Diagnosis not present

## 2023-03-12 DIAGNOSIS — Z8616 Personal history of COVID-19: Secondary | ICD-10-CM | POA: Diagnosis not present

## 2023-03-12 DIAGNOSIS — I4819 Other persistent atrial fibrillation: Secondary | ICD-10-CM | POA: Insufficient documentation

## 2023-03-12 DIAGNOSIS — I441 Atrioventricular block, second degree: Secondary | ICD-10-CM | POA: Diagnosis not present

## 2023-03-12 DIAGNOSIS — Z7901 Long term (current) use of anticoagulants: Secondary | ICD-10-CM | POA: Diagnosis not present

## 2023-03-12 DIAGNOSIS — I10 Essential (primary) hypertension: Secondary | ICD-10-CM | POA: Insufficient documentation

## 2023-03-12 DIAGNOSIS — D6869 Other thrombophilia: Secondary | ICD-10-CM | POA: Insufficient documentation

## 2023-03-12 NOTE — Progress Notes (Signed)
 Primary Care Physician: [name], MD Primary Cardiologist: Dr [name] Primary Electrophysiologist: Dr [name] Referring Physician: Dr [name] is a 73 y.o. female with a history of HTN, HLD, CAD, thoracic aortic aneurysm, atrial fibrillation who presents for follow up in the [facility].  She is status post TEE cardioversion 12/13/2021. She developed COVID in October and had been fatigued since then. She also has a history of Mobitz 1 AV block and thus rate control has not been started.  She presented to the emergency room with palpitations 12/17/21. She was given adenosine which apparently converted her to sinus rhythm. It was thought that this was all atrial fibrillation and atrial flutter. She was seen by Dr [name] and started on amiodarone as a bridge to ablation. Patient is on Eliquis for stroke prevention.   Patient is s/p afib ablation with Dr [name] on 05/06/22.   Patient returns for follow up for atrial fibrillation. She reports that she has done well with no symptoms of afib since her ablation. No bleeding issues on anticoagulation. She denies any presyncope, dizziness, or falls.   Today, he denies symptoms of palpitations, chest pain, orthopnea, PND, lower extremity edema, dizziness, presyncope, syncope, snoring, daytime somnolence, bleeding, or neurologic sequela. The patient is tolerating medications without difficulties and is otherwise without complaint today.    Atrial Fibrillation Risk Factors:  she does not have symptoms or diagnosis of sleep apnea. she does not have a history of rheumatic fever.   Atrial Fibrillation Management history:  Previous antiarrhythmic drugs: amiodarone  Previous cardioversions: 12/13/21 Previous ablations: 05/06/22 Anticoagulation history: Eliquis   Past Medical History:  Diagnosis Date   A-fib (HCC)    Arthritis    C. difficile diarrhea    Cancer (HCC)    skin cancer on nose    Coronary artery disease    Depression    Dysrhythmia    A.fib- had ablation and 2 cardioversions   Hepatitis    Hep A as a child   History of kidney stones    Hyperlipidemia    Hypertension    Personal history of colonic polyp- adenoma 09/22/2013   Pneumonia    Pre-diabetes    Pre-invasive breast cancer    had Bilateral mastectomies   Rectal prolapse    Vitamin D deficiency     Current Outpatient Medications  Medication Sig Dispense Refill   acetaminophen (TYLENOL) 650 MG CR tablet Take 1,300 mg by mouth every 8 (eight) hours as needed for pain.     ALPRAZolam (XANAX) 1 MG tablet TAKE 1/2-1 TABLET BY MOUTH AT BEDTIME (Patient taking differently: Take 0.5-1 mg by mouth at bedtime as needed for sleep.) 20 tablet 0   Ascorbic Acid (VITAMIN C) 1000 MG tablet Take 1,000 mg by mouth at bedtime.     Cholecalciferol (DIALYVITE VITAMIN D 5000) 125 MCG (5000 UT) capsule Take 10,000 Units by mouth at bedtime.     cyclobenzaprine (FLEXERIL) 10 MG tablet Take  1 tablet   3 x / day  for Muscle Spasms 90 tablet 3   diltiazem (CARDIZEM) 30 MG tablet Take 1 tablet every 4 hours AS NEEDED for AFIB heart rate >100 as long as top BP >100. 30 tablet 1   ELIQUIS 5 MG TABS tablet TAKE 1 TABLET BY MOUTH TWICE A DAY 180 tablet 3   Evolocumab (REPATHA SURECLICK) 140 MG/ML SOAJ Inject 140 mg into the skin every 14 (fourteen) days. 6 mL 3  ezetimibe (ZETIA) 10 MG tablet TAKE 1 TABLET BY MOUTH EVERY DAY FOR CHOLESTEROL 90 tablet 3   gabapentin (NEURONTIN) 100 MG capsule TAKE 1 TO 3 CAPSULES 1 TO 2 HOURS BEFORE BEDTIME AS NEEDED FOR SLEEP (Patient taking differently: Take 300 mg by mouth at bedtime.) 90 capsule 3   Magnesium 500 MG TABS Take 500-1,000 mg by mouth at bedtime.     OVER THE COUNTER MEDICATION Apply 1 application  topically daily as needed (Pain). [name]     saccharomyces boulardii (FLORASTOR) 250 MG capsule Take 250 mg by mouth daily.     telmisartan (MICARDIS) 40 MG tablet Take 1 tablet (40  mg total) by mouth daily. 90 tablet 3   traZODone (DESYREL) 50 MG tablet Take 1 tablet (50 mg total) by mouth at bedtime. 30 tablet 2   Zinc 50 MG TABS Take 50 mg by mouth every other day. At bedtime     No current facility-administered medications for this encounter.    ROS- All systems are reviewed and negative except as per the HPI above.  Physical Exam: Vitals:   03/12/23 1544  BP: 138/88  Pulse: 77  Weight: 89.4 kg  Height: 5\' 8"  (1.727 m)     GEN: Well nourished, well developed in no acute distress CARDIAC: Regular rate and rhythm with occasional ectopy, no murmurs, rubs, gallops RESPIRATORY:  Clear to auscultation without rales, wheezing or rhonchi  ABDOMEN: Soft, non-tender, non-distended EXTREMITIES:  No edema; No deformity    Wt Readings from Last 3 Encounters:  03/12/23 89.4 kg  12/25/22 86.6 kg  12/17/22 86.6 kg    EKG today demonstrates  Mobitz I AV block Vent. rate 77 BPM PR interval 272 ms QRS duration 96 ms QT/QTcB 394/445 ms   Echo 08/20/22 demonstrated   1. Left ventricular ejection fraction, by estimation, is 50 to 55%. The  left ventricle has low normal function. The left ventricle has no regional  wall motion abnormalities. There is mild concentric left ventricular  hypertrophy. Left ventricular diastolic parameters are consistent with Grade I diastolic dysfunction (impaired relaxation).   2. Right ventricular systolic function is normal. The right ventricular  size is normal.   3. The mitral valve is normal in structure. No evidence of mitral valve  regurgitation. No evidence of mitral stenosis.   4. The aortic valve is normal in structure. Aortic valve regurgitation is  not visualized. Aortic valve sclerosis is present, with no evidence of  aortic valve stenosis.   5. The aortic arch is dilated. Aneurysm of the ascending aorta, measuring  47 mm.   6. The inferior vena cava is normal in size with greater than 50%  respiratory variability,  suggesting right atrial pressure of 3 mmHg.   Comparison(s): Compared to prior study in 12/2021 ascending aorta  dilatation has worsened was 45mm now 47mm.   Conclusion(s)/Recommendation(s): Due to ascending aorta and aortic arch  dilatation recommend further testing with Chest CTA or MRA.   Epic records are reviewed at length today  CHA2DS2-VASc Score = 4  The patient's score is based upon: CHF History: 0 HTN History: 1 Diabetes History: 0 Stroke History: 0 Vascular Disease History: 1 Age Score: 1 Gender Score: 1       ASSESSMENT AND PLAN: Persistent Atrial Fibrillation (ICD10:  I48.19) The patient's CHA2DS2-VASc score is 4, indicating a 4.8% annual risk of stroke.   S/p afib ablation 05/06/22 Patient appears to be maintaining SR. She is in Mobitz I AV block  today which she has a known history, asymptomatic.  Continue Eliquis 5 mg BID Continue diltiazem 30 mg PRN q 4 hours for heart racing  Secondary Hypercoagulable State (ICD10:  D68.69) The patient is at significant risk for stroke/thromboembolism based upon her CHA2DS2-VASc Score of 4.  Continue Apixaban (Eliquis).   2nd degree AV block Mobitz I  HR within normal range, asymptomatic. Avoiding scheduled AV nodal agents.   HTN Stable on current regimen  CAD PET showed evidence of ischemia FFR negative 08/2022 No anginal symptoms Followed by Dr [name]    Follow up with Dr [name] as scheduled. AF clinic in one year.    Jorja Loa PA-C Afib Clinic [facility] [address] [place], [place] [address] [phone] 03/12/2023 4:12 PM

## 2023-03-23 ENCOUNTER — Emergency Department (HOSPITAL_BASED_OUTPATIENT_CLINIC_OR_DEPARTMENT_OTHER)
Admission: EM | Admit: 2023-03-23 | Discharge: 2023-03-23 | Disposition: A | Discharge status: Home / Self Care | Attending: Emergency Medicine | Admitting: Emergency Medicine

## 2023-03-23 ENCOUNTER — Ambulatory Visit: Payer: PPO | Admitting: Nurse Practitioner

## 2023-03-23 ENCOUNTER — Emergency Department (HOSPITAL_BASED_OUTPATIENT_CLINIC_OR_DEPARTMENT_OTHER): Admitting: Radiology

## 2023-03-23 ENCOUNTER — Other Ambulatory Visit: Payer: Self-pay

## 2023-03-23 ENCOUNTER — Encounter (HOSPITAL_BASED_OUTPATIENT_CLINIC_OR_DEPARTMENT_OTHER): Payer: Self-pay | Admitting: Emergency Medicine

## 2023-03-23 DIAGNOSIS — X58XXXA Exposure to other specified factors, initial encounter: Secondary | ICD-10-CM | POA: Insufficient documentation

## 2023-03-23 DIAGNOSIS — Z7901 Long term (current) use of anticoagulants: Secondary | ICD-10-CM | POA: Insufficient documentation

## 2023-03-23 DIAGNOSIS — M19071 Primary osteoarthritis, right ankle and foot: Secondary | ICD-10-CM | POA: Diagnosis not present

## 2023-03-23 DIAGNOSIS — S91101A Unspecified open wound of right great toe without damage to nail, initial encounter: Secondary | ICD-10-CM | POA: Insufficient documentation

## 2023-03-23 DIAGNOSIS — L97519 Non-pressure chronic ulcer of other part of right foot with unspecified severity: Secondary | ICD-10-CM | POA: Diagnosis not present

## 2023-03-23 DIAGNOSIS — F5101 Primary insomnia: Secondary | ICD-10-CM

## 2023-03-23 MED ORDER — GABAPENTIN 100 MG PO CAPS: ORAL_CAPSULE | ORAL | 1 refills | Status: DC

## 2023-03-23 MED ORDER — BACITRACIN ZINC 500 UNIT/GM EX OINT: 1.0000 | TOPICAL_OINTMENT | Freq: Two times a day (BID) | CUTANEOUS | 0 refills | Status: DC

## 2023-03-23 MED ORDER — OXYCODONE-ACETAMINOPHEN 5-325 MG PO TABS: 1.0000 | ORAL_TABLET | Freq: Three times a day (TID) | ORAL | 0 refills | Status: AC | PRN

## 2023-03-23 NOTE — ED Provider Notes (Signed)
 [place] EMERGENCY DEPARTMENT AT [facility] Provider Note   CSN: [id number] Arrival date & time: 03/23/23  1159     History  Chief Complaint  Patient presents with   Foot Pain    Taylor Li is a 73 y.o. female with a history of atrial fibrillation and pre-diabetes who presents to the ED today for foot pain.  Patient reports that she has chronic wounds on her right big toe and second toe and is here for further evaluation she says there tender and she is prediabetic.  She is able to bleed on the foot but has some tenderness, no drainage from the wound site, redness, swelling, warmth to touch, or fevers.  No additional complaints or concerns at this time.    Home Medications Prior to Admission medications   Medication Sig Start Date End Date Taking? Authorizing Provider  bacitracin ointment Apply 1 Application topically 2 (two) times daily. 03/23/23  Yes Maxwell Marion, PA-C  oxyCODONE-acetaminophen (PERCOCET/ROXICET) 5-325 MG tablet Take 1 tablet by mouth every 8 (eight) hours as needed for up to 3 days for severe pain (pain score 7-10). 03/23/23 03/26/23 Yes Maxwell Marion, PA-C  acetaminophen (TYLENOL) 650 MG CR tablet Take 1,300 mg by mouth every 8 (eight) hours as needed for pain.    [provider]  ALPRAZolam (XANAX) 1 MG tablet TAKE 1/2-1 TABLET BY MOUTH AT BEDTIME Patient taking differently: Take 0.5-1 mg by mouth at bedtime as needed for sleep. 11/17/22   Raynelle Dick, NP  Ascorbic Acid (VITAMIN C) 1000 MG tablet Take 1,000 mg by mouth at bedtime.    [provider]  Cholecalciferol (DIALYVITE VITAMIN D 5000) 125 MCG (5000 UT) capsule Take 10,000 Units by mouth at bedtime.    [provider]  cyclobenzaprine (FLEXERIL) 10 MG tablet Take  1 tablet   3 x / day  for Muscle Spasms 02/09/23   Adela Glimpse, NP  diltiazem (CARDIZEM) 30 MG tablet Take 1 tablet every 4 hours AS NEEDED for AFIB heart rate >100 as long as top BP >100. 03/26/22    Fenton, Clint R, PA  ELIQUIS 5 MG TABS tablet TAKE 1 TABLET BY MOUTH TWICE A DAY 02/03/23   Weaver, Scott T, PA-C  Evolocumab (REPATHA SURECLICK) 140 MG/ML SOAJ Inject 140 mg into the skin every 14 (fourteen) days. 06/06/22   Runell Gess, MD  ezetimibe (ZETIA) 10 MG tablet TAKE 1 TABLET BY MOUTH EVERY DAY FOR CHOLESTEROL 09/17/22   Raynelle Dick, NP  gabapentin (NEURONTIN) 100 MG capsule Take  1 to 3 capsules   1 to 2 hours   before Bedtime as needed for Sleep 03/23/23   Worthy Rancher B, FNP  Magnesium 500 MG TABS Take 500-1,000 mg by mouth at bedtime.    [provider]  OVER THE COUNTER MEDICATION Apply 1 application  topically daily as needed (Pain). [employer]pensing optician, Historical, MD  saccharomyces boulardii (FLORASTOR) 250 MG capsule Take 250 mg by mouth daily.    [provider]  telmisartan (MICARDIS) 40 MG tablet Take 1 tablet (40 mg total) by mouth daily. 07/01/22   Tereso Newcomer T, PA-C  traZODone (DESYREL) 50 MG tablet Take 1 tablet (50 mg total) by mouth at bedtime. 02/11/23   Adela Glimpse, NP  Zinc 50 MG TABS Take 50 mg by mouth every other day. At bedtime    [provider]      Allergies    Rosuvastatin and  Other    Review of Systems   Review of Systems  Skin:  Positive for wound.  All other systems reviewed and are negative.   Physical Exam Updated Vital Signs BP 113/76   Pulse 91   Temp 98.7 F (37.1 C)   Resp 15   Ht 5\' 8"  (1.727 m)   Wt 86.2 kg   SpO2 98%   BMI 28.89 kg/m  Physical Exam Vitals and nursing note reviewed.  Constitutional:      General: She is not in acute distress.    Appearance: Normal appearance.  HENT:     Head: Normocephalic and atraumatic.     Mouth/Throat:     Mouth: Mucous membranes are moist.  Eyes:     Conjunctiva/sclera: Conjunctivae normal.     Pupils: Pupils are equal, round, and reactive to light.  Cardiovascular:     Rate and Rhythm: Normal rate and regular rhythm.     Pulses:  Normal pulses.     Heart sounds: Normal heart sounds.  Pulmonary:     Effort: Pulmonary effort is normal.     Breath sounds: Normal breath sounds.  Abdominal:     Palpations: Abdomen is soft.     Tenderness: There is no abdominal tenderness.  Musculoskeletal:        General: Tenderness present. Normal range of motion.     Cervical back: Normal range of motion.     Comments: Tenderness to palpation of the pad of the right big toe without erythema, bleeding, or warmth to touch   Skin:    General: Skin is warm and dry.     Findings: No rash.  Neurological:     General: No focal deficit present.     Mental Status: She is alert.     Sensory: No sensory deficit.     Motor: No weakness.  Psychiatric:        Mood and Affect: Mood normal.        Behavior: Behavior normal.        ED Results / Procedures / Treatments   Labs (all labs ordered are listed, but only abnormal results are displayed) Labs Reviewed - No data to display  EKG None  Radiology DG Foot Complete Right Result Date: 03/23/2023 CLINICAL DATA:  Toe ulcerations EXAM: RIGHT FOOT COMPLETE - 3 VIEW COMPARISON:  None Available. FINDINGS: No evidence of acute fracture or malalignment. No conventional radiographic evidence of osteomyelitis. Degenerative osteoarthritis present at the great toe MTP joint. Type 2 accessory navicular bone results and bony prominence at the medial aspect of the midfoot. IMPRESSION: 1. No evidence of fracture, malalignment or conventional radiographic evidence of osteomyelitis. 2. 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 3. Type 2 accessory navicular bone resulting in bony prominence of the medial aspect of the midfoot. Electronically Signed   By: Malachy Moan M.D.   On: 03/23/2023 16:08    Procedures Procedures: not indicated.   Medications Ordered in ED Medications - No data to display  ED Course/ Medical Decision Making/ A&P                                  Medical Decision Making Amount and/or Complexity of Data Reviewed Radiology: ordered.   This patient presents to the ED for concern of foot pain, this involves an extensive number of treatment options, and is a complaint that carries with it a high risk  of complications and morbidity.   Differential diagnosis includes: Fracture, dislocation, diabetic foot ulcer, cellulitis, osteomyelitis, gangrene, etc.   Comorbidities  See HPI above   Additional History  Additional history obtained from prior records   Imaging Studies  I ordered imaging studies including right foot x-ray  I independently visualized and interpreted imaging which showed:  No evidence of acute fracture, malalignment, or evidence of osteomyelitis. I agree with the radiologist interpretation   Problem List / ED Course / Critical Interventions / Medication Management  Wound at the bottom of the right big toe it has been there for "a while."  She states that she is some tenderness at the ball of the toe and is here for further evaluation.  Concerned she may have an infection. No redness, warmth to touch, fevers, or drainage from the site. X-ray does not show any acute abnormalities. Prescription for bacitracin ointment sent to the pharmacy.  Information for wound care given for follow-up.   Social Determinants of Health  Access to healthcare   Test / Admission - Considered  Patient is stable and safe discharge home. Return precautions given.       Final Clinical Impression(s) / ED Diagnoses Final diagnoses:  Open wound of right great toe, initial encounter    Rx / DC Orders ED Discharge Orders          Ordered    bacitracin ointment  2 times daily        03/23/23 1624    oxyCODONE-acetaminophen (PERCOCET/ROXICET) 5-325 MG tablet  Every 8 hours PRN        03/23/23 1626              Maxwell Marion, PA-C 03/23/23 1646    Derwood Kaplan, MD 03/23/23 1719

## 2023-03-23 NOTE — Discharge Instructions (Addendum)
 As discussed, there is no associated systemic infection with your toe wounds. Use Bacitracin on the wounds twice a day. Take percocet every 8 hours for pain.  Do not drive or operate machinery while taking this medication as it can cause drowsiness.  Information for wound care to follow-up with for reevaluation.  Also provided information for primary care provider if you need to establish care.  Get help right away if: You have a red streak going away from your wound. A wound that was closed breaks open. The wound is bleeding, and the bleeding does not stop with gentle pressure. You have trouble breathing. The wound is on your hand or foot, and: You cannot properly move a finger or toe. Your fingers or toes look pale or bluish.

## 2023-03-23 NOTE — ED Triage Notes (Signed)
 Pt caox4, ambulatory c/o wounds that have been on toes on R foot that has been ongoing "for a while now." Pt states she is pre-diabetic.

## 2023-03-25 DIAGNOSIS — M25512 Pain in left shoulder: Secondary | ICD-10-CM | POA: Diagnosis not present

## 2023-03-25 DIAGNOSIS — M19211 Secondary osteoarthritis, right shoulder: Secondary | ICD-10-CM | POA: Diagnosis not present

## 2023-03-31 ENCOUNTER — Ambulatory Visit: Payer: PPO | Attending: Cardiovascular Disease | Admitting: Cardiovascular Disease

## 2023-03-31 ENCOUNTER — Encounter: Payer: Self-pay | Admitting: Cardiovascular Disease

## 2023-03-31 VITALS — BP 118/80 | HR 76 | Ht 68.0 in | Wt 194.0 lb

## 2023-03-31 DIAGNOSIS — I4819 Other persistent atrial fibrillation: Secondary | ICD-10-CM

## 2023-03-31 DIAGNOSIS — I7121 Aneurysm of the ascending aorta, without rupture: Secondary | ICD-10-CM | POA: Diagnosis not present

## 2023-03-31 DIAGNOSIS — I2510 Atherosclerotic heart disease of native coronary artery without angina pectoris: Secondary | ICD-10-CM

## 2023-03-31 DIAGNOSIS — E782 Mixed hyperlipidemia: Secondary | ICD-10-CM | POA: Diagnosis not present

## 2023-03-31 DIAGNOSIS — I10 Essential (primary) hypertension: Secondary | ICD-10-CM | POA: Diagnosis not present

## 2023-03-31 DIAGNOSIS — I480 Paroxysmal atrial fibrillation: Secondary | ICD-10-CM | POA: Diagnosis not present

## 2023-03-31 NOTE — Assessment & Plan Note (Signed)
 History of CAD status post cardiac PET study which was abnormal 08/13/2022 with a subsequent CTA that showed a coronary calcium score of 340 with nonobstructive disease.  She denies chest pain.

## 2023-03-31 NOTE — Assessment & Plan Note (Signed)
 History of PAF status post A-fib ablation by Dr. Corlis Leak.  She has maintained sinus rhythm on Eliquis oral anticoagulation.

## 2023-03-31 NOTE — Assessment & Plan Note (Signed)
 History of thoracic aortic aneurysm measuring 44 mm by CTA performed 08/20/2022.  This will be repeated on an annual basis.

## 2023-03-31 NOTE — Progress Notes (Signed)
 03/31/2023 Taylor Li   1950/07/05  [id number]  Primary Physician [name], MD Primary Cardiologist: Runell Gess MD Nicholes [place], [place]  HPI:  Taylor Li is a 73 y.o.  mildly overweight married Caucasian female mother of 2, grandmother of 3 grandchildren referred by Dr. Oneta Rack for cardiovascular evaluation because of chest pain and palpitations.  She is a Optician, dispensing.  I last saw her in the office 09/04/2017.  Her cardiac risk factors include treated hypertension, hyper hyperlipidemia and discontinued tobacco abuse having smoked 35 pack years.  She is noticed palpitations the last several months and has had EKGs that showed ventricular bigeminy.  She is also had several episodes of atypical chest pain.  A recent chest CT done for "cancer screening" on 07/21/2017 did show 4.3 cm ascending thoracic aortic aneurysm without evidence of coronary calcification.  Since I saw her last she did have a A-fib ablation performed/23/24.  She had a cardiac PET study that was abnormal in July of last year which led to a coronary CTA that showed a coronary calcium score of 340 with nonobstructive disease by FFR analysis.  Her most recent chest CT at that time was 43 mm unchanged from her prior chest CT 5 years before.  She is otherwise asymptomatic and denies chest pain or shortness of breath.   Current Meds  Medication Sig   acetaminophen (TYLENOL) 650 MG CR tablet Take 1,300 mg by mouth every 8 (eight) hours as needed for pain.   Ascorbic Acid (VITAMIN C) 1000 MG tablet Take 1,000 mg by mouth at bedtime.   bacitracin ointment Apply 1 Application topically 2 (two) times daily.   Cholecalciferol (DIALYVITE VITAMIN D 5000) 125 MCG (5000 UT) capsule Take 10,000 Units by mouth at bedtime.   cyclobenzaprine (FLEXERIL) 10 MG tablet Take  1 tablet   3 x / day  for Muscle Spasms   diltiazem (CARDIZEM) 30 MG tablet Take 1 tablet every 4 hours AS NEEDED for AFIB heart rate >100 as long  as top BP >100.   ELIQUIS 5 MG TABS tablet TAKE 1 TABLET BY MOUTH TWICE A DAY   Evolocumab (REPATHA SURECLICK) 140 MG/ML SOAJ Inject 140 mg into the skin every 14 (fourteen) days.   ezetimibe (ZETIA) 10 MG tablet TAKE 1 TABLET BY MOUTH EVERY DAY FOR CHOLESTEROL   gabapentin (NEURONTIN) 100 MG capsule Take  1 to 3 capsules   1 to 2 hours   before Bedtime as needed for Sleep   Magnesium 500 MG TABS Take 500-1,000 mg by mouth at bedtime.   OVER THE COUNTER MEDICATION Apply 1 application  topically daily as needed (Pain). [name]   saccharomyces boulardii (FLORASTOR) 250 MG capsule Take 250 mg by mouth daily.   telmisartan (MICARDIS) 40 MG tablet Take 1 tablet (40 mg total) by mouth daily.   traZODone (DESYREL) 50 MG tablet Take 1 tablet (50 mg total) by mouth at bedtime.   Zinc 50 MG TABS Take 50 mg by mouth every other day. At bedtime     Allergies  Allergen Reactions   Rosuvastatin     myalgias   Other Itching and Rash    Fresh green peppers    Social History   Socioeconomic History   Marital status: Married    Spouse name: Not on file   Number of children: Not on file   Years of education: Not on file   Highest education level: Not on file  Occupational History   Occupation: Charity fundraiser  Tobacco Use   Smoking status: Former    Current packs/day: 0.00    Types: Cigarettes, E-cigarettes    Quit date: 04/18/2012    Years since quitting: 10.9    Passive exposure: Current   Smokeless tobacco: Never   Tobacco comments:    Former smoker 03/26/22  Vaping Use   Vaping status: Never Used  Substance and Sexual Activity   Alcohol use: Yes    Comment: 3-4 glasses wine weekly 03/26/22   Drug use: No   Sexual activity: Not Currently  Other Topics Concern   Not on file  Social History Narrative   Married, Charity fundraiser   Former tobacco smoker now Librarian, academic   + EtOH and caffeine   No drugs   Social Drivers of Corporate investment banker Strain: Not on file  Food Insecurity: Not on file   Transportation Needs: Not on file  Physical Activity: Not on file  Stress: Not on file  Social Connections: Not on file  Intimate Partner Violence: Not on file     Review of Systems: General: negative for chills, fever, night sweats or weight changes.  Cardiovascular: negative for chest pain, dyspnea on exertion, edema, orthopnea, palpitations, paroxysmal nocturnal dyspnea or shortness of breath Dermatological: negative for rash Respiratory: negative for cough or wheezing Urologic: negative for hematuria Abdominal: negative for nausea, vomiting, diarrhea, bright red blood per rectum, melena, or hematemesis Neurologic: negative for visual changes, syncope, or dizziness All other systems reviewed and are otherwise negative except as noted above.    Blood pressure 118/80, pulse 76, height 5\' 8"  (1.727 m), weight 194 lb (88 kg), SpO2 97%.  General appearance: alert and no distress Neck: no adenopathy, no carotid bruit, no JVD, supple, symmetrical, trachea midline, and thyroid not enlarged, symmetric, no tenderness/mass/nodules Lungs: clear to auscultation bilaterally Heart: regular rate and rhythm, S1, S2 normal, no murmur, click, rub or gallop Extremities: extremities normal, atraumatic, no cyanosis or edema Pulses: 2+ and symmetric Skin: Skin color, texture, turgor normal. No rashes or lesions Neurologic: Grossly normal  EKG EKG Interpretation Date/Time:  Tuesday March 31 2023 15:30:20 EDT Ventricular Rate:  76 PR Interval:  244 QRS Duration:  94 QT Interval:  398 QTC Calculation: 447 R Axis:   -57  Text Interpretation: Sinus rhythm with 1st degree A-V block with Premature supraventricular complexes Left axis deviation Minimal voltage criteria for LVH, may be normal variant ( Cornell product ) Inferior infarct , age undetermined Possible Anterior infarct (cited on or before 26-Mar-2022) When compared with ECG of 12-Mar-2023 15:46, Premature supraventricular complexes are now  Present Inferior infarct is now Present Confirmed by Nanetta Batty [phone]) on 03/31/2023 3:43:18 PM    ASSESSMENT AND PLAN:   PAF (paroxysmal atrial fibrillation) (HCC) History of PAF status post A-fib ablation by Dr. Corlis Leak.  She has maintained sinus rhythm on Eliquis oral anticoagulation.  Essential hypertension History of essential hypertension her blood pressure measured today at 118/80.  She is on Micardis.  Hyperlipidemia History of hyperlipidemia on Repatha and Zetia with lipid profile performed 11/24/2022 revealing total cholesterol 161, LDL 58 and HDL 85.  Thoracic aortic aneurysm [facility]) History of thoracic aortic aneurysm measuring 44 mm by CTA performed 08/20/2022.  This will be repeated on an annual basis.  CAD (coronary artery disease) History of CAD status post cardiac PET study which was abnormal 08/13/2022 with a subsequent CTA that showed a coronary calcium score of 340 with nonobstructive disease.  She  denies chest pain.     Runell Gess MD FACP,FACC,FAHA, [facility] 03/31/2023 4:00 PM

## 2023-03-31 NOTE — Assessment & Plan Note (Signed)
 History of essential hypertension her blood pressure measured today at 118/80.  She is on Micardis.

## 2023-03-31 NOTE — Patient Instructions (Signed)
 Medication Instructions:  Your physician recommends that you continue on your current medications as directed. Please refer to the Current Medication list given to you today.  *If you need a refill on your cardiac medications before your next appointment, please call your pharmacy*   Lab Work: Your physician recommends that you return for lab work in: 1-2 week prior to chest CTA- BMET  If you have labs (blood work) drawn today and your tests are completely normal, you will receive your results only by: MyChart Message (if you have MyChart) OR A paper copy in the mail If you have any lab test that is abnormal or we need to change your treatment, we will call you to review the results.   Testing/Procedures: Non-Cardiac CT Angiography (CTA) Chest/Aorta, is a special type of CT scan that uses a computer to produce multi-dimensional views of major blood vessels throughout the body. In CT angiography, a contrast material is injected through an IV to help visualize the blood vessels **To do in August**   Follow-Up: At [facility], you and your health needs are our priority.  As part of our continuing mission to provide you with exceptional heart care, we have created designated Provider Care Teams.  These Care Teams include your primary Cardiologist (physician) and Advanced Practice Providers (APPs -  Physician Assistants and Nurse Practitioners) who all work together to provide you with the care you need, when you need it.  We recommend signing up for the patient portal called "MyChart".  Sign up information is provided on this After Visit Summary.  MyChart is used to connect with patients for Virtual Visits (Telemedicine).  Patients are able to view lab/test results, encounter notes, upcoming appointments, etc.  Non-urgent messages can be sent to your provider as well.   To learn more about what you can do with MyChart, go to [email or web address].    Your next appointment:   12  month(s)  Provider:   Nanetta Batty, MD     Other Instructions   1st Floor: - Lobby - Registration  - Pharmacy  - Lab - Cafe  2nd Floor: - PV Lab - Diagnostic Testing (echo, CT, nuclear med)  3rd Floor: - Vacant  4th Floor: - TCTS (cardiothoracic surgery) - AFib Clinic - Structural Heart Clinic - Vascular Surgery  - Vascular Ultrasound  5th Floor: - HeartCare Cardiology (general and EP) - Clinical Pharmacy for coumadin, hypertension, lipid, weight-loss medications, and med management appointments    Valet parking services will be available as well.

## 2023-03-31 NOTE — Assessment & Plan Note (Signed)
 History of hyperlipidemia on Repatha and Zetia with lipid profile performed 11/24/2022 revealing total cholesterol 161, LDL 58 and HDL 85.

## 2023-04-01 ENCOUNTER — Encounter: Payer: Self-pay | Admitting: Cardiovascular Disease

## 2023-04-07 ENCOUNTER — Ambulatory Visit: Payer: PPO | Admitting: Family Medicine

## 2023-05-06 ENCOUNTER — Telehealth: Payer: Self-pay

## 2023-05-06 ENCOUNTER — Other Ambulatory Visit (HOSPITAL_COMMUNITY): Payer: Self-pay

## 2023-05-06 ENCOUNTER — Telehealth: Payer: Self-pay | Admitting: Pharmacist

## 2023-05-06 NOTE — Telephone Encounter (Signed)
 Taylor Li renewed. Please see separate encounter for details

## 2023-05-06 NOTE — Telephone Encounter (Signed)
 Patient Advocate Encounter   The patient was approved for a [employer] grant that will help cover the cost of REPATHA  Total amount awarded, $2,500.  Effective: 05/06/23 - 05/04/24   RUE:[id number] [id number]   Pharmacy provided with approval and processing information. Patient informed via Tilman Fonder, CPhT  Pharmacy Patient Advocate Specialist  Direct Number: [phone] Fax: [phone]

## 2023-05-11 ENCOUNTER — Other Ambulatory Visit: Payer: Self-pay | Admitting: Family Medicine

## 2023-05-11 NOTE — Telephone Encounter (Signed)
 Copied from CRM [id number]. Topic: Clinical - Medication Refill >> May 11, 2023 10:38 AM Ovid Blow wrote: Most Recent Primary Care Visit:   Medication: traZODone  (DESYREL ) 50 MG tablet   Has the patient contacted their pharmacy? Yes (Agent: If no, request that the patient contact the pharmacy for the refill. If patient does not wish to contact the pharmacy document the reason why and proceed with request.) (Agent: If yes, when and what did the pharmacy advise?)  Is this the correct pharmacy for this prescription? Yes If no, delete pharmacy and type the correct one.  This is the patient's preferred pharmacy:  [id number] - [place], [place] - [address] [place] [address] Phone: [phone] Fax: [phone]   Has the prescription been filled recently? No  Is the patient out of the medication? No  Has the patient been seen for an appointment in the last year OR does the patient have an upcoming appointment? Yes  Can we respond through MyChart? Yes  Agent: Please be advised that Rx refills may take up to 3 business days. We ask that you follow-up with your pharmacy.

## 2023-05-13 MED ORDER — TRAZODONE HCL 50 MG PO TABS: 50.0000 mg | ORAL_TABLET | Freq: Every day | ORAL | 2 refills | Status: DC

## 2023-05-13 NOTE — Telephone Encounter (Signed)
 Patient called back and explained further that the her pharmacy is still trying to contact her old provider but the number is disconnected because they are no longer at the practice. Please call and advise.

## 2023-06-13 DIAGNOSIS — F411 Generalized anxiety disorder: Secondary | ICD-10-CM | POA: Insufficient documentation

## 2023-06-13 NOTE — Progress Notes (Unsigned)
 New Patient Office Visit  Subjective    Patient ID: Taylor Li, female    DOB: 06-Jan-1951  Age: 73 y.o. MRN: [id number]  CC: No chief complaint on file.   HPI [name] presents to establish care today. Up to date on routine vaccines. Up to date on routine screenings.  Receives regular dental and eye care.  Reports eating well, sleeping well, feeling well overall.  Reports compliance with medication regimen.  Denies other concerns today.  Outpatient Encounter Medications as of 06/15/2023  Medication Sig  . acetaminophen  (TYLENOL ) 650 MG CR tablet Take 1,300 mg by mouth every 8 (eight) hours as needed for pain.  . ALPRAZolam  (XANAX ) 1 MG tablet TAKE 1/2-1 TABLET BY MOUTH AT BEDTIME (Patient taking differently: Take 0.5-1 mg by mouth at bedtime as needed for sleep.)  . Ascorbic Acid (VITAMIN C) 1000 MG tablet Take 1,000 mg by mouth at bedtime.  . bacitracin  ointment Apply 1 Application topically 2 (two) times daily.  . Cholecalciferol (DIALYVITE VITAMIN D  5000) 125 MCG (5000 UT) capsule Take 10,000 Units by mouth at bedtime.  . cyclobenzaprine  (FLEXERIL ) 10 MG tablet Take  1 tablet   3 x / day  for Muscle Spasms  . diltiazem  (CARDIZEM ) 30 MG tablet Take 1 tablet every 4 hours AS NEEDED for AFIB heart rate >100 as long as top BP >100.  . ELIQUIS  5 MG TABS tablet TAKE 1 TABLET BY MOUTH TWICE A DAY  . Evolocumab  (REPATHA  SURECLICK) 140 MG/ML SOAJ Inject 140 mg into the skin every 14 (fourteen) days.  . ezetimibe  (ZETIA ) 10 MG tablet TAKE 1 TABLET BY MOUTH EVERY DAY FOR CHOLESTEROL  . gabapentin  (NEURONTIN ) 100 MG capsule Take  1 to 3 capsules   1 to 2 hours   before Bedtime as needed for Sleep  . Magnesium 500 MG TABS Take 500-1,000 mg by mouth at bedtime.  Aaron Aas OVER THE COUNTER MEDICATION Apply 1 application  topically daily as needed (Pain). [name]  . saccharomyces boulardii (FLORASTOR) 250 MG capsule Take 250 mg by mouth daily.  . telmisartan  (MICARDIS ) 40 MG tablet  Take 1 tablet (40 mg total) by mouth daily.  . traZODone  (DESYREL ) 50 MG tablet Take 1 tablet (50 mg total) by mouth at bedtime.  . Zinc  50 MG TABS Take 50 mg by mouth every other day. At bedtime   No facility-administered encounter medications on file as of 06/15/2023.    Past Medical History:  Diagnosis Date  . A-fib (HCC)   . Arthritis   . C. difficile diarrhea   . Cancer (HCC)    skin cancer on nose  . Coronary artery disease   . Depression   . Dysrhythmia    A.fib- had ablation and 2 cardioversions  . Hepatitis    Hep A as a child  . History of kidney stones   . Hyperlipidemia   . Hypertension   . Personal history of colonic polyp- adenoma 09/22/2013  . Pneumonia   . Pre-diabetes   . Pre-invasive breast cancer    had Bilateral mastectomies  . Rectal prolapse   . Vitamin D  deficiency     Past Surgical History:  Procedure Laterality Date  . APPENDECTOMY    . ATRIAL FIBRILLATION ABLATION N/A 05/06/2022   Procedure: ATRIAL FIBRILLATION ABLATION;  Surgeon: Lei Pump, MD;  Location: [facility] INVASIVE CV LAB;  Service: Cardiovascular;  Laterality: N/A;  . BREAST SURGERY Bilateral 1983   SQ mastectomies  . CARDIOVERSION    .  CARDIOVERSION N/A 12/13/2021   Procedure: CARDIOVERSION;  Surgeon: Maudine Sos, MD;  Location: [facility] ENDOSCOPY;  Service: Cardiovascular;  Laterality: N/A;  . CHOLECYSTECTOMY     laparoscopic  . COLONOSCOPY    . DIAGNOSTIC LAPAROSCOPY  1989   Diagnostic then lysis of adhesions  . LAMINECTOMY  1982   L4-L5  . NASAL SEPTUM SURGERY    . REVERSE SHOULDER ARTHROPLASTY Left 12/25/2022   Procedure: REVERSE SHOULDER ARTHROPLASTY;  Surgeon: Sammye Cristal, MD;  Location: [facility] ORS;  Service: Orthopedics;  Laterality: Left;  . TEE WITHOUT CARDIOVERSION N/A 12/13/2021   Procedure: TRANSESOPHAGEAL ECHOCARDIOGRAM (TEE);  Surgeon: Maudine Sos, MD;  Location: [facility] ENDOSCOPY;  Service: Cardiovascular;  Laterality: N/A;  . TONSILLECTOMY    . VAGINAL  HYSTERECTOMY  1985    Family History  Problem Relation Age of Onset  . Hypertension Mother   . Barrett's esophagus Mother   . Mitral valve prolapse Mother   . Atrial fibrillation Mother   . Hypertension Father   . Lung cancer Father   . Thyroid  disease Father   . Clotting disorder Father   . Colon cancer Neg Hx   . Diabetes Neg Hx   . Esophageal cancer Neg Hx   . Pancreatic cancer Neg Hx   . Stomach cancer Neg Hx     Social History   Socioeconomic History  . Marital status: Married    Spouse name: Not on file  . Number of children: Not on file  . Years of education: Not on file  . Highest education level: Bachelor's degree (e.g., BA, AB, BS)  Occupational History  . Occupation: Charity fundraiser  Tobacco Use  . Smoking status: Former    Current packs/day: 0.00    Types: Cigarettes, E-cigarettes    Quit date: 04/18/2012    Years since quitting: 11.1    Passive exposure: Current  . Smokeless tobacco: Never  . Tobacco comments:    Former smoker 03/26/22  Vaping Use  . Vaping status: Never Used  Substance and Sexual Activity  . Alcohol use: Yes    Comment: 3-4 glasses wine weekly 03/26/22  . Drug use: No  . Sexual activity: Not Currently  Other Topics Concern  . Not on file  Social History Narrative   Married, Charity fundraiser   Former tobacco smoker now Librarian, academic   + EtOH and caffeine   No drugs   Social Drivers of Corporate investment banker Strain: Low Risk  (06/12/2023)   Overall Physicist, medical Strain (CARDIA)   . Difficulty of Paying Living Expenses: Not hard at all  Food Insecurity: No Food Insecurity (06/12/2023)   Hunger Vital Sign   . Worried About Programme researcher, broadcasting/film/video in the Last Year: Never true   . Ran Out of Food in the Last Year: Never true  Transportation Needs: No Transportation Needs (06/12/2023)   PRAPARE - Transportation   . Lack of Transportation (Medical): No   . Lack of Transportation (Non-Medical): No  Physical Activity: Unknown (06/12/2023)   Exercise Vital Sign    . Days of Exercise per Week: 0 days   . Minutes of Exercise per Session: Not on file  Stress: Stress Concern Present (06/12/2023)   [employer] of Occupational Health - Occupational Stress Questionnaire   . Feeling of Stress : To some extent  Social Connections: Moderately Integrated (06/12/2023)   Social Connection and Isolation Panel [NHANES]   . Frequency of Communication with Friends and Family: More than three times a week   .  Frequency of Social Gatherings with Friends and Family: Not on file   . Attends Religious Services: 1 to 4 times per year   . Active Member of Clubs or Organizations: No   . Attends Banker Meetings: Not on file   . Marital Status: Married  Catering manager Violence: Not on file    ROS Per HPI      Objective    There were no vitals taken for this visit.  Physical Exam Vitals and nursing note reviewed.  Constitutional:      General: She is not in acute distress.    Appearance: Normal appearance. She is normal weight.  HENT:     Head: Normocephalic and atraumatic.     Right Ear: External ear normal.     Left Ear: External ear normal.     Nose: Nose normal.     Mouth/Throat:     Mouth: Mucous membranes are moist.     Pharynx: Oropharynx is clear.  Eyes:     Extraocular Movements: Extraocular movements intact.     Pupils: Pupils are equal, round, and reactive to light.  Cardiovascular:     Rate and Rhythm: Normal rate and regular rhythm.     Pulses: Normal pulses.     Heart sounds: Normal heart sounds.  Pulmonary:     Effort: Pulmonary effort is normal. No respiratory distress.     Breath sounds: Normal breath sounds. No wheezing, rhonchi or rales.  Musculoskeletal:        General: Normal range of motion.     Cervical back: Normal range of motion.     Right lower leg: No edema.     Left lower leg: No edema.  Lymphadenopathy:     Cervical: No cervical adenopathy.  Neurological:     General: No focal deficit present.      Mental Status: She is alert and oriented to person, place, and time.  Psychiatric:        Mood and Affect: Mood normal.        Thought Content: Thought content normal.       Assessment & Plan:   Essential hypertension  Hypercoagulable state due to persistent atrial fibrillation (HCC)  Persistent atrial fibrillation (HCC)  Aneurysm of ascending aorta without rupture (HCC)  Chronic obstructive pulmonary disease, unspecified COPD type (HCC)  Medication management  Vitamin D  deficiency  GAD (generalized anxiety disorder)     No follow-ups on file.   Wellington Half, FNP

## 2023-06-15 ENCOUNTER — Other Ambulatory Visit: Payer: Self-pay | Admitting: Physician Assistant

## 2023-06-15 ENCOUNTER — Encounter: Payer: Self-pay | Admitting: Family Medicine

## 2023-06-15 ENCOUNTER — Ambulatory Visit (INDEPENDENT_AMBULATORY_CARE_PROVIDER_SITE_OTHER): Admitting: Family Medicine

## 2023-06-15 VITALS — BP 138/94 | HR 77 | Temp 98.8°F | Ht 68.0 in | Wt 187.8 lb

## 2023-06-15 DIAGNOSIS — I429 Cardiomyopathy, unspecified: Secondary | ICD-10-CM

## 2023-06-15 DIAGNOSIS — I7121 Aneurysm of the ascending aorta, without rupture: Secondary | ICD-10-CM | POA: Diagnosis not present

## 2023-06-15 DIAGNOSIS — G720 Drug-induced myopathy: Secondary | ICD-10-CM | POA: Diagnosis not present

## 2023-06-15 DIAGNOSIS — R634 Abnormal weight loss: Secondary | ICD-10-CM

## 2023-06-15 DIAGNOSIS — T466X5A Adverse effect of antihyperlipidemic and antiarteriosclerotic drugs, initial encounter: Secondary | ICD-10-CM

## 2023-06-15 DIAGNOSIS — I10 Essential (primary) hypertension: Secondary | ICD-10-CM | POA: Diagnosis not present

## 2023-06-15 DIAGNOSIS — J449 Chronic obstructive pulmonary disease, unspecified: Secondary | ICD-10-CM

## 2023-06-15 DIAGNOSIS — Z79899 Other long term (current) drug therapy: Secondary | ICD-10-CM

## 2023-06-15 DIAGNOSIS — E0849 Diabetes mellitus due to underlying condition with other diabetic neurological complication: Secondary | ICD-10-CM

## 2023-06-15 DIAGNOSIS — F411 Generalized anxiety disorder: Secondary | ICD-10-CM | POA: Diagnosis not present

## 2023-06-15 DIAGNOSIS — D6869 Other thrombophilia: Secondary | ICD-10-CM

## 2023-06-15 DIAGNOSIS — E559 Vitamin D deficiency, unspecified: Secondary | ICD-10-CM | POA: Diagnosis not present

## 2023-06-15 DIAGNOSIS — I4819 Other persistent atrial fibrillation: Secondary | ICD-10-CM

## 2023-06-15 DIAGNOSIS — M1990 Unspecified osteoarthritis, unspecified site: Secondary | ICD-10-CM | POA: Insufficient documentation

## 2023-06-15 DIAGNOSIS — M150 Primary generalized (osteo)arthritis: Secondary | ICD-10-CM

## 2023-06-15 MED ORDER — EZETIMIBE 10 MG PO TABS: 10.0000 mg | ORAL_TABLET | Freq: Every day | ORAL | 3 refills | Status: AC

## 2023-06-15 MED ORDER — TRAZODONE HCL 50 MG PO TABS: 50.0000 mg | ORAL_TABLET | Freq: Every day | ORAL | 2 refills | Status: DC

## 2023-06-15 NOTE — Patient Instructions (Addendum)
 Welcome to [facility]!  Thank you for choosing us  for your Primary Care needs.   We offer in person and video appointments for your convenience. You may call our office to schedule appointments, or you may schedule appointments with me through MyChart.   The best way to get in contact with me is via MyChart message. This will get to me faster than a phone call, unless there is an emergency, then please call 911.  The lab is located downstairs in the Sports Medicine building, we also have xray available there.   We are checking labs today, will be in contact with any results that require further attention  Magnesium glycinate at night.  Melatonin at night with your current regimen

## 2023-06-16 ENCOUNTER — Encounter: Payer: Self-pay | Admitting: Family Medicine

## 2023-06-16 DIAGNOSIS — I429 Cardiomyopathy, unspecified: Secondary | ICD-10-CM | POA: Insufficient documentation

## 2023-06-16 DIAGNOSIS — G720 Drug-induced myopathy: Secondary | ICD-10-CM | POA: Insufficient documentation

## 2023-06-16 LAB — HEMOGLOBIN A1C: Hgb A1c MFr Bld: 5.9 % (ref 4.6–6.5)

## 2023-06-16 LAB — LIPID PANEL
Cholesterol: 100 mg/dL (ref 0–200)
HDL: 63.7 mg/dL (ref >39.00)
LDL Cholesterol: 21 mg/dL (ref 0–99)
NonHDL: 36.08
Total CHOL/HDL Ratio: 2
Triglycerides: 76 mg/dL (ref 0.0–149.0)
VLDL: 15.2 mg/dL (ref 0.0–40.0)

## 2023-06-16 LAB — CBC WITH DIFFERENTIAL/PLATELET
Basophils Absolute: 0 10*3/uL (ref 0.0–0.1)
Basophils Relative: 0.4 % (ref 0.0–3.0)
Eosinophils Absolute: 0.1 10*3/uL (ref 0.0–0.7)
Eosinophils Relative: 1.4 % (ref 0.0–5.0)
HCT: 43.7 % (ref 36.0–46.0)
Hemoglobin: 14.3 g/dL (ref 12.0–15.0)
Lymphocytes Relative: 39.3 % (ref 12.0–46.0)
Lymphs Abs: 2.3 10*3/uL (ref 0.7–4.0)
MCHC: 32.7 g/dL (ref 30.0–36.0)
MCV: 88.6 fl (ref 78.0–100.0)
Monocytes Absolute: 0.4 10*3/uL (ref 0.1–1.0)
Monocytes Relative: 7.4 % (ref 3.0–12.0)
Neutro Abs: 3 10*3/uL (ref 1.4–7.7)
Neutrophils Relative %: 51.5 % (ref 43.0–77.0)
Platelets: 245 10*3/uL (ref 150.0–400.0)
RBC: 4.93 Mil/uL (ref 3.87–5.11)
RDW: 13.4 % (ref 11.5–15.5)
WBC: 5.8 10*3/uL (ref 4.0–10.5)

## 2023-06-16 LAB — COMPREHENSIVE METABOLIC PANEL WITH GFR
ALT: 21 U/L (ref 0–35)
AST: 19 U/L (ref 0–37)
Albumin: 4.3 g/dL (ref 3.5–5.2)
Alkaline Phosphatase: 70 U/L (ref 39–117)
BUN: 14 mg/dL (ref 6–23)
CO2: 29 meq/L (ref 19–32)
Calcium: 9.3 mg/dL (ref 8.4–10.5)
Chloride: 106 meq/L (ref 96–112)
Creatinine, Ser: 0.71 mg/dL (ref 0.40–1.20)
GFR: 85.71 mL/min (ref >60.00)
Glucose, Bld: 103 mg/dL — ABNORMAL HIGH (ref 70–99)
Potassium: 4.3 meq/L (ref 3.5–5.1)
Sodium: 141 meq/L (ref 135–145)
Total Bilirubin: 0.8 mg/dL (ref 0.2–1.2)
Total Protein: 6.8 g/dL (ref 6.0–8.3)

## 2023-06-16 LAB — VITAMIN D 25 HYDROXY (VIT D DEFICIENCY, FRACTURES): VITD: 72.55 ng/mL (ref 30.00–100.00)

## 2023-06-16 LAB — TSH: TSH: 0.7 u[IU]/mL (ref 0.35–5.50)

## 2023-06-16 NOTE — Assessment & Plan Note (Signed)
 Multiple joints, stable

## 2023-06-16 NOTE — Assessment & Plan Note (Signed)
 Continue trazodone  50 mg at bedtime Declines other long-acting antianxiety medications today

## 2023-06-16 NOTE — Assessment & Plan Note (Signed)
 Lipids ordered, on repatha  and zetia 

## 2023-06-16 NOTE — Assessment & Plan Note (Signed)
 Labs ordered, will dose adjust as needed

## 2023-06-16 NOTE — Assessment & Plan Note (Signed)
 Labs today

## 2023-06-16 NOTE — Assessment & Plan Note (Signed)
 Stable post ablation Continue Eliquis  Continue Cardizem  as needed heart rate over 100

## 2023-06-16 NOTE — Assessment & Plan Note (Signed)
 4.4 cm on CTA 09/02/2022 Continue annual surveillance

## 2023-06-16 NOTE — Assessment & Plan Note (Signed)
-   Vit D levels today

## 2023-06-16 NOTE — Assessment & Plan Note (Signed)
 Stable, continue current med regimen EKG today with sinus arrhythmia

## 2023-06-16 NOTE — Assessment & Plan Note (Signed)
 Check blood pressures at home over the next 2 weeks Continue telmisartan  Follow-up with A-fib clinic as scheduled

## 2023-06-16 NOTE — Assessment & Plan Note (Signed)
 Stable, continue Eliquis  Continue Cardizem  as needed for heart rate over 100

## 2023-06-16 NOTE — Assessment & Plan Note (Signed)
 Stable

## 2023-06-18 ENCOUNTER — Ambulatory Visit: Payer: Self-pay | Admitting: Family Medicine

## 2023-07-01 DIAGNOSIS — M19211 Secondary osteoarthritis, right shoulder: Secondary | ICD-10-CM | POA: Diagnosis not present

## 2023-07-01 DIAGNOSIS — M25512 Pain in left shoulder: Secondary | ICD-10-CM | POA: Diagnosis not present

## 2023-07-10 ENCOUNTER — Ambulatory Visit (INDEPENDENT_AMBULATORY_CARE_PROVIDER_SITE_OTHER)

## 2023-07-10 VITALS — Ht 68.0 in | Wt 187.0 lb

## 2023-07-10 DIAGNOSIS — Z0000 Encounter for general adult medical examination without abnormal findings: Secondary | ICD-10-CM | POA: Diagnosis not present

## 2023-07-10 NOTE — Progress Notes (Signed)
 Subjective:   Taylor Li is a 73 y.o. who presents for a Medicare Wellness preventive visit.  As a reminder, Annual Wellness Visits don't include a physical exam, and some assessments may be limited, especially if this visit is performed virtually. We may recommend an in-person follow-up visit with your provider if needed.  Visit Complete: Virtual I connected with  Taylor Li on 07/10/23 by a audio enabled telemedicine application and verified that I am speaking with the correct person using two identifiers.  Patient Location: Home  Provider Location: Office/Clinic  I discussed the limitations of evaluation and management by telemedicine. The patient expressed understanding and agreed to proceed.  Vital Signs: Because this visit was a virtual/telehealth visit, some criteria may be missing or patient reported. Any vitals not documented were not able to be obtained and vitals that have been documented are patient reported.  VideoDeclined- This patient declined Librarian, academic. Therefore the visit was completed with audio only.  Persons Participating in Visit: Patient.  AWV Questionnaire: No: Patient Medicare AWV questionnaire was not completed prior to this visit.  Cardiac Risk Factors include: advanced age (>68men, >20 women);dyslipidemia;hypertension     Objective:    Today's Vitals   07/10/23 0848  Weight: 187 lb (84.8 kg)  Height: 5' 8 (1.727 m)   Body mass index is 28.43 kg/m.     07/10/2023    8:50 AM 03/23/2023   12:14 PM 12/25/2022    6:29 AM 12/17/2022    2:37 PM 10/14/2022    1:52 PM 05/06/2022    5:50 AM 12/17/2021    3:02 AM  Advanced Directives  Does Patient Have a Medical Advance Directive? Yes No Yes Yes Yes No Yes  Type of Estate agent of [place];Living will  Healthcare Power of [place];Living will    Healthcare Power of [place];Living will  Does patient want to make changes to medical advance  directive? No - Patient declined  No - Patient declined No - Patient declined     Copy of Healthcare Power of Attorney in Chart? Yes - validated most recent copy scanned in chart (See row information)  No - copy requested      Would patient like information on creating a medical advance directive?  No - Patient declined    No - Patient declined     Current Medications (verified) Outpatient Encounter Medications as of 07/10/2023  Medication Sig   acetaminophen  (TYLENOL ) 650 MG CR tablet Take 1,300 mg by mouth every 8 (eight) hours as needed for pain.   Ascorbic Acid (VITAMIN C) 1000 MG tablet Take 1,000 mg by mouth at bedtime.   Cholecalciferol (DIALYVITE VITAMIN D  5000) 125 MCG (5000 UT) capsule Take 10,000 Units by mouth at bedtime.   cyclobenzaprine  (FLEXERIL ) 10 MG tablet Take  1 tablet   3 x / day  for Muscle Spasms   diltiazem  (CARDIZEM ) 30 MG tablet Take 1 tablet every 4 hours AS NEEDED for AFIB heart rate >100 as long as top BP >100.   ELIQUIS  5 MG TABS tablet TAKE 1 TABLET BY MOUTH TWICE A DAY   Evolocumab  (REPATHA  SURECLICK) 140 MG/ML SOAJ Inject 140 mg into the skin every 14 (fourteen) days.   ezetimibe  (ZETIA ) 10 MG tablet Take 1 tablet (10 mg total) by mouth daily.   gabapentin  (NEURONTIN ) 100 MG capsule Take  1 to 3 capsules   1 to 2 hours   before Bedtime as needed for Sleep  Magnesium 500 MG TABS Take 500-1,000 mg by mouth at bedtime.   OVER THE COUNTER MEDICATION Apply 1 application  topically daily as needed (Pain). [name]   saccharomyces boulardii (FLORASTOR) 250 MG capsule Take 250 mg by mouth daily.   telmisartan  (MICARDIS ) 40 MG tablet TAKE 1 TABLET BY MOUTH EVERY DAY   traZODone  (DESYREL ) 50 MG tablet Take 1 tablet (50 mg total) by mouth at bedtime.   Zinc  50 MG TABS Take 50 mg by mouth every other day. At bedtime   No facility-administered encounter medications on file as of 07/10/2023.    Allergies (verified) Rosuvastatin  and Other   History: Past Medical  History:  Diagnosis Date   A-fib (HCC)    Allergy    Anxiety    Arthritis    C. difficile diarrhea    Cancer (HCC)    skin cancer on nose   Cataract    Coronary artery disease    Depression    Dysrhythmia    A.fib- had ablation and 2 cardioversions   Hepatitis    Hep A as a child   History of kidney stones    Hyperlipidemia    Hypertension    Personal history of colonic polyp- adenoma 09/22/2013   Pneumonia    Pre-diabetes    Pre-invasive breast cancer    had Bilateral mastectomies   Rectal prolapse    Vitamin D  deficiency    Past Surgical History:  Procedure Laterality Date   APPENDECTOMY     ATRIAL FIBRILLATION ABLATION N/A 05/06/2022   Procedure: ATRIAL FIBRILLATION ABLATION;  Surgeon: Inocencio Soyla Lunger, MD;  Location: [facility] INVASIVE CV LAB;  Service: Cardiovascular;  Laterality: N/A;   BREAST SURGERY Bilateral 1983   SQ mastectomies   CARDIOVERSION     CARDIOVERSION N/A 12/13/2021   Procedure: CARDIOVERSION;  Surgeon: Raford Riggs, MD;  Location: [facility] ENDOSCOPY;  Service: Cardiovascular;  Laterality: N/A;   CHOLECYSTECTOMY     laparoscopic   COLONOSCOPY     DIAGNOSTIC LAPAROSCOPY  1989   Diagnostic then lysis of adhesions   JOINT REPLACEMENT  12/25/3022   Left Reverse Total Shoulder   LAMINECTOMY  1982   L4-L5   NASAL SEPTUM SURGERY     REVERSE SHOULDER ARTHROPLASTY Left 12/25/2022   Procedure: REVERSE SHOULDER ARTHROPLASTY;  Surgeon: Dozier Soulier, MD;  Location: [facility] ORS;  Service: Orthopedics;  Laterality: Left;   SPINE SURGERY  1982   TEE WITHOUT CARDIOVERSION N/A 12/13/2021   Procedure: TRANSESOPHAGEAL ECHOCARDIOGRAM (TEE);  Surgeon: Raford Riggs, MD;  Location: [facility] ENDOSCOPY;  Service: Cardiovascular;  Laterality: N/A;   TONSILLECTOMY     VAGINAL HYSTERECTOMY  1985   Family History  Problem Relation Age of Onset   Hypertension Mother    Barrett's esophagus Mother    Mitral valve prolapse Mother    Atrial fibrillation Mother     Hypertension Father    Lung cancer Father    Thyroid  disease Father    Clotting disorder Father    Varicose Veins Father    Colon cancer Neg Hx    Diabetes Neg Hx    Esophageal cancer Neg Hx    Pancreatic cancer Neg Hx    Stomach cancer Neg Hx    Social History   Socioeconomic History   Marital status: Married    Spouse name: Not on file   Number of children: Not on file   Years of education: Not on file   Highest education level: Bachelor's degree (e.g., BA, AB, BS)  Occupational History   Occupation: Charity fundraiser  Tobacco Use   Smoking status: Former    Current packs/day: 0.00    Types: Cigarettes, E-cigarettes    Quit date: 04/18/2012    Years since quitting: 11.2    Passive exposure: Current   Smokeless tobacco: Never   Tobacco comments:    Former smoker 03/26/22  Vaping Use   Vaping status: Never Used  Substance and Sexual Activity   Alcohol use: Not Currently    Alcohol/week: 2.0 standard drinks of alcohol    Types: 2 Glasses of wine per week    Comment: 2 glasses per week   Drug use: No   Sexual activity: Yes    Birth control/protection: Surgical  Other Topics Concern   Not on file  Social History Narrative   Married, Charity fundraiser   Former tobacco smoker now Librarian, academic   + EtOH and caffeine   No drugs   Social Drivers of Corporate investment banker Strain: Low Risk  (07/10/2023)   Overall Financial Resource Strain (CARDIA)    Difficulty of Paying Living Expenses: Not hard at all  Food Insecurity: No Food Insecurity (07/10/2023)   Hunger Vital Sign    Worried About Running Out of Food in the Last Year: Never true    Ran Out of Food in the Last Year: Never true  Transportation Needs: No Transportation Needs (07/10/2023)   PRAPARE - Administrator, Civil Service (Medical): No    Lack of Transportation (Non-Medical): No  Physical Activity: Inactive (07/10/2023)   Exercise Vital Sign    Days of Exercise per Week: 0 days    Minutes of Exercise per Session: 0 min   Stress: Stress Concern Present (07/10/2023)   [employer] of Occupational Health - Occupational Stress Questionnaire    Feeling of Stress: To some extent  Social Connections: Moderately Integrated (07/10/2023)   Social Connection and Isolation Panel    Frequency of Communication with Friends and Family: More than three times a week    Frequency of Social Gatherings with Friends and Family: More than three times a week    Attends Religious Services: Never    Database administrator or Organizations: Yes    Attends Banker Meetings: Never    Marital Status: Married    Tobacco Counseling Counseling given: No Tobacco comments: Former smoker 03/26/22    Clinical Intake:  Pre-visit preparation completed: Yes  Pain : No/denies pain     BMI - recorded: 28.43 Nutritional Status: BMI 25 -29 Overweight Nutritional Risks: None Diabetes: No  Lab Results  Component Value Date   HGBA1C 5.9 06/16/2023   HGBA1C 5.8 (H) 08/04/2022   HGBA1C 6.0 (H) 02/03/2022     How often do you need to have someone help you when you read instructions, pamphlets, or other written materials from your doctor or pharmacy?: 1 - Never  Interpreter Needed?: No  Information entered by :: Taylor Li, CMA   Activities of Daily Living     07/10/2023    8:54 AM 12/17/2022    2:40 PM  In your present state of health, do you have any difficulty performing the following activities:  Hearing? 0   Vision? 0   Difficulty concentrating or making decisions? 0   Walking or climbing stairs? 0   Dressing or bathing? 0   Doing errands, shopping? 0 0  Preparing Food and eating ? N   Using the Toilet? N   In the past six  months, have you accidently leaked urine? Y   Comment weas a pantyliner   Do you have problems with loss of bowel control? N   Managing your Medications? N   Managing your Finances? N   Housekeeping or managing your Housekeeping? N     Patient Care Team: Alvia Corean CROME, FNP as PCP - General (Family Medicine) Court Dorn PARAS, MD as PCP - Cardiology (Cardiology) Inocencio Soyla Lunger, MD as PCP - Electrophysiology (Cardiology) Dozier Soulier, MD as Consulting Physician (Orthopedic Surgery)  I have updated your Care Teams any recent Medical Services you may have received from other providers in the past year.     Assessment:   This is a routine wellness examination for Taylor Li.  Hearing/Vision screen Hearing Screening - Comments:: Denies hearing difficulties   Vision Screening - Comments:: Wears rx glasses - plans to schedule an appt w/[facility]   Goals Addressed               This Visit's Progress     Patient Stated (pt-stated)        Patient stated she plans to continue to exercise and walk more in addition to monitoring her diet       Depression Screen     07/10/2023    8:56 AM 08/03/2022   11:37 PM 02/03/2022    1:03 AM 12/09/2021    4:27 PM 07/30/2021   12:40 AM 03/07/2021    2:48 PM 10/05/2019    1:05 AM  PHQ 2/9 Scores  PHQ - 2 Score 0 0 0 0 0 0 0  PHQ- 9 Score 0          Fall Risk     07/10/2023    8:55 AM 08/03/2022   11:36 PM 02/03/2022    9:56 PM 02/03/2022    1:03 AM 12/09/2021    4:27 PM  Fall Risk   Falls in the past year? 0 0 0 0 0  Number falls in past yr: 0    0  Injury with Fall? 0    0  Risk for fall due to : No Fall Risks No Fall Risks  No Fall Risks No Fall Risks  Follow up Falls evaluation completed;Falls prevention discussed Falls prevention discussed;Education provided;Falls evaluation completed  Falls evaluation completed;Education provided;Falls prevention discussed  Falls evaluation completed;Falls prevention discussed      Data saved with a previous flowsheet row definition    MEDICARE RISK AT HOME:  Medicare Risk at Home Any stairs in or around the home?: No If so, are there any without handrails?: No Home free of loose throw rugs in walkways, pet beds, electrical cords, etc?:  Yes Adequate lighting in your home to reduce risk of falls?: Yes Life alert?: No Use of a cane, walker or w/c?: No Grab bars in the bathroom?: No Shower chair or bench in shower?: No Elevated toilet seat or a handicapped toilet?: No  TIMED UP AND GO:  Was the test performed?  No  Cognitive Function: 6CIT completed    07/04/2019   10:23 AM  MMSE - Mini Mental State Exam  Orientation to time 5  Orientation to Place 5  Registration 3  Attention/ Calculation 5  Recall 3  Language- name 2 objects 2  Language- repeat 1  Language- follow 3 step command 3  Language- read & follow direction 1  Write a sentence 1  Copy design 1  Total score 30  07/10/2023    9:01 AM  6CIT Screen  What Year? 0 points  What month? 0 points  What time? 0 points  Count back from 20 0 points  Months in reverse 0 points  Repeat phrase 2 points  Total Score 2 points    Immunizations Immunization History  Administered Date(s) Administered   DTaP 01/13/2001   Influenza Inj Mdck Quad With Preservative 09/30/2016   Influenza Whole 10/06/2012   Influenza, High Dose Seasonal PF 10/15/2017, 10/24/2019, 12/09/2021, 09/16/2022   Influenza, Quadrivalent, Recombinant, Inj, Pf 10/15/2018   Influenza,inj,Quad PF,6+ Mos 10/27/2020   Influenza,inj,quad, With Preservative 10/18/2015   Influenza-Unspecified 11/11/2013, 11/15/2014   PFIZER Comirnaty(Gray Top)Covid-19 Tri-Sucrose Vaccine 05/15/2020, 09/16/2022   PFIZER(Purple Top)SARS-COV-2 Vaccination 02/25/2019, 03/22/2019, 10/24/2019   PPD Test 01/14/2013, 04/17/2014, 04/16/2015, 05/15/2016   Pfizer Covid-19 Vaccine Bivalent Booster 36yrs & up 10/27/2020   Pneumococcal Conjugate-13 06/15/2017   Pneumococcal Polysaccharide-23 01/13/2001, 02/01/2019   Tdap 10/18/2015   Zoster Recombinant(Shingrix) 03/29/2021, 06/14/2021   Zoster, Live 11/19/2009    Screening Tests Health Maintenance  Topic Date Due   COVID-19 Vaccine (7 - 2024-25 season)  11/11/2022   INFLUENZA VACCINE  08/14/2023   Medicare Annual Wellness (AWV)  07/09/2024   DTaP/Tdap/Td (3 - Td or Tdap) 10/17/2025   Colonoscopy  10/26/2030   Pneumococcal Vaccine: 50+ Years  Completed   DEXA SCAN  Completed   Hepatitis C Screening  Completed   Zoster Vaccines- Shingrix  Completed   Hepatitis B Vaccines  Aged Out   HPV VACCINES  Aged Out   Meningococcal B Vaccine  Aged Out    Health Maintenance  Health Maintenance Due  Topic Date Due   COVID-19 Vaccine (7 - 2024-25 season) 11/11/2022   Health Maintenance Items Addressed: 07/10/2023   Additional Screening:  Vision Screening: Recommended annual ophthalmology exams for early detection of glaucoma and other disorders of the eye. Would you like a referral to an eye doctor? No Patient stated she plans to schedule an eye exam appt w/[facility] for 2025.   Dental Screening: Recommended annual dental exams for proper oral hygiene  Community Resource Referral / Chronic Care Management: CRR required this visit?  No   CCM required this visit?  No   Plan:    I have personally reviewed and noted the following in the patient's chart:   Medical and social history Use of alcohol, tobacco or illicit drugs  Current medications and supplements including opioid prescriptions. Patient is not currently taking opioid prescriptions. Functional ability and status Nutritional status Physical activity Advanced directives List of other physicians Hospitalizations, surgeries, and ER visits in previous 12 months Vitals Screenings to include cognitive, depression, and falls Referrals and appointments  In addition, I have reviewed and discussed with patient certain preventive protocols, quality metrics, and best practice recommendations. A written personalized care plan for preventive services as well as general preventive health recommendations were provided to patient.   Taylor Li, CMA   07/10/2023   After Visit  Summary: (MyChart) Due to this being a telephonic visit, the after visit summary with patients personalized plan was offered to patient via MyChart   Notes: Nothing significant to report at this time.

## 2023-07-10 NOTE — Patient Instructions (Signed)
 Taylor Li , Thank you for taking time out of your busy schedule to complete your Annual Wellness Visit with me. I enjoyed our conversation and look forward to speaking with you again next year. I, as well as your care team,  appreciate your ongoing commitment to your health goals. Please review the following plan we discussed and let me know if I can assist you in the future. Your Game plan/ To Do List   Follow up Visits: Next Medicare AWV with our clinical staff: 07/12/2024   Have you seen your provider in the last 6 months (3 months if uncontrolled diabetes)? Yes Next Office Visit with your provider: to be scheduled by the patient  Clinician Recommendations:  Aim for 30 minutes of exercise or brisk walking, 6-8 glasses of water , and 5 servings of fruits and vegetables each day.       This is a list of the screening recommended for you and due dates:  Health Maintenance  Topic Date Due   COVID-19 Vaccine (7 - 2024-25 season) 11/11/2022   Flu Shot  08/14/2023   Medicare Annual Wellness Visit  07/09/2024   DTaP/Tdap/Td vaccine (3 - Td or Tdap) 10/17/2025   Colon Cancer Screening  10/26/2030   Pneumococcal Vaccine for age over 96  Completed   DEXA scan (bone density measurement)  Completed   Hepatitis C Screening  Completed   Zoster (Shingles) Vaccine  Completed   Hepatitis B Vaccine  Aged Out   HPV Vaccine  Aged Out   Meningitis B Vaccine  Aged Out    Advanced directives: (In Chart) A copy of your advanced directives are scanned into your chart should your provider ever need it. Advance Care Planning is important because it:  [x]  Makes sure you receive the medical care that is consistent with your values, goals, and preferences  [x]  It provides guidance to your family and loved ones and reduces their decisional burden about whether or not they are making the right decisions based on your wishes.  Follow the link provided in your after visit summary or read over the paperwork we  have mailed to you to help you started getting your Advance Directives in place. If you need assistance in completing these, please reach out to us  so that we can help you!

## 2023-07-15 ENCOUNTER — Other Ambulatory Visit: Payer: Self-pay

## 2023-07-15 ENCOUNTER — Encounter: Payer: Self-pay | Admitting: Family Medicine

## 2023-07-15 DIAGNOSIS — F5101 Primary insomnia: Secondary | ICD-10-CM

## 2023-07-15 MED ORDER — GABAPENTIN 100 MG PO CAPS: ORAL_CAPSULE | ORAL | 1 refills | Status: DC

## 2023-07-24 ENCOUNTER — Other Ambulatory Visit: Payer: Self-pay | Admitting: Cardiovascular Disease

## 2023-07-24 DIAGNOSIS — I2510 Atherosclerotic heart disease of native coronary artery without angina pectoris: Secondary | ICD-10-CM

## 2023-08-13 ENCOUNTER — Encounter: Payer: Self-pay | Admitting: Cardiovascular Disease

## 2023-08-13 ENCOUNTER — Encounter: Payer: PPO | Admitting: Internal Medicine

## 2023-08-19 DIAGNOSIS — M25512 Pain in left shoulder: Secondary | ICD-10-CM | POA: Diagnosis not present

## 2023-08-19 DIAGNOSIS — I7121 Aneurysm of the ascending aorta, without rupture: Secondary | ICD-10-CM | POA: Diagnosis not present

## 2023-08-19 DIAGNOSIS — M19211 Secondary osteoarthritis, right shoulder: Secondary | ICD-10-CM | POA: Diagnosis not present

## 2023-08-20 ENCOUNTER — Ambulatory Visit: Payer: Self-pay | Admitting: Cardiovascular Disease

## 2023-08-20 DIAGNOSIS — I10 Essential (primary) hypertension: Secondary | ICD-10-CM

## 2023-08-20 DIAGNOSIS — I7121 Aneurysm of the ascending aorta, without rupture: Secondary | ICD-10-CM

## 2023-08-20 LAB — BASIC METABOLIC PANEL WITH GFR
BUN/Creatinine Ratio: 25 (ref 12–28)
BUN: 15 mg/dL (ref 8–27)
CO2: 23 mmol/L (ref 20–29)
Calcium: 9.5 mg/dL (ref 8.7–10.3)
Chloride: 101 mmol/L (ref 96–106)
Creatinine, Ser: 0.6 mg/dL (ref 0.57–1.00)
Glucose: 95 mg/dL (ref 70–99)
Potassium: 4.5 mmol/L (ref 3.5–5.2)
Sodium: 140 mmol/L (ref 134–144)
eGFR: 96 mL/min/1.73 (ref >59)

## 2023-08-31 ENCOUNTER — Ambulatory Visit (HOSPITAL_BASED_OUTPATIENT_CLINIC_OR_DEPARTMENT_OTHER)
Admission: RE | Admit: 2023-08-31 | Discharge: 2023-08-31 | Disposition: A | Discharge status: Home / Self Care | Source: Ambulatory Visit | Attending: Cardiovascular Disease | Admitting: Cardiovascular Disease

## 2023-08-31 DIAGNOSIS — I10 Essential (primary) hypertension: Secondary | ICD-10-CM | POA: Diagnosis not present

## 2023-08-31 DIAGNOSIS — I2510 Atherosclerotic heart disease of native coronary artery without angina pectoris: Secondary | ICD-10-CM | POA: Diagnosis not present

## 2023-08-31 DIAGNOSIS — I7121 Aneurysm of the ascending aorta, without rupture: Secondary | ICD-10-CM | POA: Insufficient documentation

## 2023-08-31 MED ORDER — IOHEXOL 350 MG/ML SOLN
100.0000 mL | Freq: Once | INTRAVENOUS | Status: AC | PRN
  Administered 2023-08-31: 80 mL via INTRAVENOUS

## 2023-09-01 ENCOUNTER — Encounter: Payer: Self-pay | Admitting: Cardiovascular Disease

## 2023-09-03 ENCOUNTER — Encounter: Payer: Self-pay | Admitting: Family Medicine

## 2023-09-12 ENCOUNTER — Other Ambulatory Visit: Payer: Self-pay | Admitting: Family Medicine

## 2023-09-12 DIAGNOSIS — F5101 Primary insomnia: Secondary | ICD-10-CM

## 2023-09-23 ENCOUNTER — Encounter: Payer: Self-pay | Admitting: Family Medicine

## 2023-09-24 ENCOUNTER — Other Ambulatory Visit: Payer: Self-pay

## 2023-09-24 DIAGNOSIS — Z23 Encounter for immunization: Secondary | ICD-10-CM

## 2023-09-24 MED ORDER — COVID-19 MRNA VACC (MODERNA) 50 MCG/0.5ML IM SUSP: 0.5000 mL | Freq: Once | INTRAMUSCULAR | 0 refills | Status: AC

## 2023-11-05 ENCOUNTER — Other Ambulatory Visit: Payer: Self-pay | Admitting: Family Medicine

## 2023-11-05 DIAGNOSIS — Z79899 Other long term (current) drug therapy: Secondary | ICD-10-CM

## 2023-11-18 ENCOUNTER — Encounter: Payer: Self-pay | Admitting: Family Medicine

## 2023-11-23 ENCOUNTER — Other Ambulatory Visit: Payer: Self-pay

## 2023-11-23 ENCOUNTER — Encounter: Payer: Self-pay | Admitting: Family Medicine

## 2023-11-23 DIAGNOSIS — F5101 Primary insomnia: Secondary | ICD-10-CM

## 2023-11-23 DIAGNOSIS — M25511 Pain in right shoulder: Secondary | ICD-10-CM

## 2023-11-23 MED ORDER — GABAPENTIN 100 MG PO CAPS: ORAL_CAPSULE | ORAL | 1 refills | Status: DC

## 2023-11-23 MED ORDER — CYCLOBENZAPRINE HCL 10 MG PO TABS: ORAL_TABLET | ORAL | 3 refills | Status: AC

## 2023-11-25 ENCOUNTER — Telehealth: Payer: Self-pay

## 2023-11-25 ENCOUNTER — Other Ambulatory Visit (HOSPITAL_COMMUNITY): Payer: Self-pay

## 2023-11-25 NOTE — Telephone Encounter (Signed)
 Pharmacy Patient Advocate Encounter   Received notification from Onbase that prior authorization for Cyclobenzaprine  HCl 10MG  tablets   is required/requested.   Insurance verification completed.   The patient is insured through [facility] ADVANTAGE/RX ADVANCE.   Per test claim: PA required; PA submitted to above mentioned insurance via Latent Key/confirmation #/EOC [id number] Status is pending

## 2023-11-27 NOTE — Telephone Encounter (Signed)
 Pharmacy Patient Advocate Encounter  Received notification from [facility] ADVANTAGE/RX ADVANCE that Prior Authorization for Cyclobenzaprine  HCl 10MG  tablets   has been APPROVED from 11/26/2023 to 12/24/2023   PA #/Case ID/Reference #: [id number]

## 2023-11-28 ENCOUNTER — Other Ambulatory Visit: Payer: Self-pay | Admitting: Family Medicine

## 2023-11-28 DIAGNOSIS — Z79899 Other long term (current) drug therapy: Secondary | ICD-10-CM

## 2023-12-02 ENCOUNTER — Encounter: Payer: Self-pay | Admitting: Internal Medicine

## 2023-12-02 ENCOUNTER — Ambulatory Visit: Admitting: Internal Medicine

## 2023-12-02 VITALS — BP 124/78 | HR 85 | Temp 97.8°F | Ht 68.0 in | Wt 181.0 lb

## 2023-12-02 DIAGNOSIS — F5101 Primary insomnia: Secondary | ICD-10-CM

## 2023-12-02 DIAGNOSIS — G2581 Restless legs syndrome: Secondary | ICD-10-CM | POA: Diagnosis not present

## 2023-12-02 DIAGNOSIS — F419 Anxiety disorder, unspecified: Secondary | ICD-10-CM

## 2023-12-02 DIAGNOSIS — F32A Depression, unspecified: Secondary | ICD-10-CM | POA: Diagnosis not present

## 2023-12-02 DIAGNOSIS — I10 Essential (primary) hypertension: Secondary | ICD-10-CM | POA: Diagnosis not present

## 2023-12-02 DIAGNOSIS — G4700 Insomnia, unspecified: Secondary | ICD-10-CM | POA: Insufficient documentation

## 2023-12-02 MED ORDER — CITALOPRAM HYDROBROMIDE 40 MG PO TABS: 40.0000 mg | ORAL_TABLET | Freq: Every day | ORAL | 11 refills | Status: DC

## 2023-12-02 MED ORDER — GABAPENTIN 300 MG PO CAPS: 300.0000 mg | ORAL_CAPSULE | Freq: Every day | ORAL | 1 refills | Status: AC

## 2023-12-02 NOTE — Patient Instructions (Signed)
 Please take all new medication as prescribed - the gabapentin  300 mg at bedtime for sleep and restless legs  Please take all new medication as prescribed - the celexa    Your Blood Pressure is good today - no change needed  Please continue all other medications as before, and refills have been done if requested.  Please have the pharmacy call with any other refills you may need.  Please keep your appointments with your specialists as you may have planned

## 2023-12-02 NOTE — Assessment & Plan Note (Signed)
 Worsening as well, now for increased gabapentin  300 mg at bedtime prn

## 2023-12-02 NOTE — Assessment & Plan Note (Signed)
 With mild worsening, no SI or HI - for restart celexa  40 mg every day, consider re-adding wellbutrin  as well, but pt prefers this to start

## 2023-12-02 NOTE — Progress Notes (Signed)
 Patient ID: Taylor Li, female   DOB: [birth year], 73 y.o.   MRN: [id number]        Chief Complaint: follow up depression, insomnia, RLS       HPI:  Taylor Li is a 73 y.o. female here with c/o worsening recent depression, no SI or HI, has taken celexa  and wellbutrin  in past that worked, not sure she wants to restart both.  Pt denies chest pain, increased sob or doe, wheezing, orthopnea, PND, increased LE swelling, palpitations, dizziness or syncope.   Pt denies polydipsia, polyuria, or new focal neuro s/s.    Also has worsening insomnia and RLS symptoms after gabapentin  recently decreased from 300 mg at bedtime to 100 mg.         Wt Readings from Last 3 Encounters:  12/02/23 181 lb (82.1 kg)  07/10/23 187 lb (84.8 kg)  06/15/23 187 lb 12.8 oz (85.2 kg)   BP Readings from Last 3 Encounters:  12/02/23 124/78  06/15/23 (!) 138/94  03/31/23 118/80         Past Medical History:  Diagnosis Date   A-fib [facility])    Allergy    Anxiety    Arthritis    C. difficile diarrhea    Cancer (HCC)    skin cancer on nose   Cataract    Coronary artery disease    Depression    Dysrhythmia    A.fib- had ablation and 2 cardioversions   Hepatitis    Hep A as a child   History of kidney stones    Hyperlipidemia    Hypertension    Personal history of colonic polyp- adenoma 09/22/2013   Pneumonia    Pre-diabetes    Pre-invasive breast cancer    had Bilateral mastectomies   Rectal prolapse    Vitamin D  deficiency    Past Surgical History:  Procedure Laterality Date   APPENDECTOMY     ATRIAL FIBRILLATION ABLATION N/A 05/06/2022   Procedure: ATRIAL FIBRILLATION ABLATION;  Surgeon: Inocencio Soyla Lunger, MD;  Location: [facility] INVASIVE CV LAB;  Service: Cardiovascular;  Laterality: N/A;   BREAST SURGERY Bilateral 1983   SQ mastectomies   CARDIOVERSION     CARDIOVERSION N/A 12/13/2021   Procedure: CARDIOVERSION;  Surgeon: Raford Riggs, MD;  Location: [facility] ENDOSCOPY;  Service: Cardiovascular;   Laterality: N/A;   CHOLECYSTECTOMY     laparoscopic   COLONOSCOPY     DIAGNOSTIC LAPAROSCOPY  1989   Diagnostic then lysis of adhesions   JOINT REPLACEMENT  12/25/3022   Left Reverse Total Shoulder   LAMINECTOMY  1982   L4-L5   NASAL SEPTUM SURGERY     REVERSE SHOULDER ARTHROPLASTY Left 12/25/2022   Procedure: REVERSE SHOULDER ARTHROPLASTY;  Surgeon: Dozier Soulier, MD;  Location: [facility] ORS;  Service: Orthopedics;  Laterality: Left;   SPINE SURGERY  1982   TEE WITHOUT CARDIOVERSION N/A 12/13/2021   Procedure: TRANSESOPHAGEAL ECHOCARDIOGRAM (TEE);  Surgeon: Raford Riggs, MD;  Location: [facility] ENDOSCOPY;  Service: Cardiovascular;  Laterality: N/A;   TONSILLECTOMY     VAGINAL HYSTERECTOMY  1985    reports that she quit smoking about 11 years ago. Her smoking use included cigarettes and e-cigarettes. She has been exposed to tobacco smoke. She has never used smokeless tobacco. She reports that she does not currently use alcohol after a past usage of about 2.0 standard drinks of alcohol per week. She reports that she does not use drugs. family history includes Atrial fibrillation in her mother; Barrett's esophagus in her  mother; Clotting disorder in her father; Hypertension in her father and mother; Lung cancer in her father; Mitral valve prolapse in her mother; Thyroid  disease in her father; Varicose Veins in her father. Allergies  Allergen Reactions   Rosuvastatin      myalgias   Other Itching and Rash    Fresh green peppers   Current Outpatient Medications on File Prior to Visit  Medication Sig Dispense Refill   acetaminophen  (TYLENOL ) 650 MG CR tablet Take 1,300 mg by mouth every 8 (eight) hours as needed for pain.     AREXVY 120 MCG/0.5ML injection      Ascorbic Acid (VITAMIN C) 1000 MG tablet Take 1,000 mg by mouth at bedtime.     Cholecalciferol (DIALYVITE VITAMIN D  5000) 125 MCG (5000 UT) capsule Take 10,000 Units by mouth at bedtime.     cyclobenzaprine  (FLEXERIL ) 10 MG tablet  Take 1 tablet by mouth for muscle spasms 90 tablet 3   diltiazem  (CARDIZEM ) 30 MG tablet Take 1 tablet every 4 hours AS NEEDED for AFIB heart rate >100 as long as top BP >100. 30 tablet 1   ELIQUIS  5 MG TABS tablet TAKE 1 TABLET BY MOUTH TWICE A DAY 180 tablet 3   Evolocumab  (REPATHA  SURECLICK) 140 MG/ML SOAJ INJECT 140 MG INTO THE SKIN EVERY 14 (FOURTEEN) DAYS. 6 mL 1   ezetimibe  (ZETIA ) 10 MG tablet Take 1 tablet (10 mg total) by mouth daily. 90 tablet 3   Magnesium 500 MG TABS Take 500-1,000 mg by mouth at bedtime.     OVER THE COUNTER MEDICATION Apply 1 application  topically daily as needed (Pain). [name]     saccharomyces boulardii (FLORASTOR) 250 MG capsule Take 250 mg by mouth daily.     telmisartan  (MICARDIS ) 40 MG tablet TAKE 1 TABLET BY MOUTH EVERY DAY 90 tablet 3   traZODone  (DESYREL ) 50 MG tablet TAKE 1 TABLET BY MOUTH EVERYDAY AT BEDTIME 90 tablet 1   Zinc  50 MG TABS Take 50 mg by mouth every other day. At bedtime     No current facility-administered medications on file prior to visit.        ROS:  All others reviewed and negative.  Objective        PE:  BP 124/78 (BP Location: Left Arm, Patient Position: Standing, Cuff Size: Normal)   Pulse 85   Temp 97.8 F (36.6 C) (Oral)   Ht 5' 8 (1.727 m)   Wt 181 lb (82.1 kg)   SpO2 99%   BMI 27.52 kg/m                 Constitutional: Pt appears in NAD               HENT: Head: NCAT.                Right Ear: External ear normal.                 Left Ear: External ear normal.                Eyes: . Pupils are equal, round, and reactive to light. Conjunctivae and EOM are normal               Nose: without d/c or deformity               Neck: Neck supple. Gross normal ROM               Cardiovascular: Normal  rate and regular rhythm.                 Pulmonary/Chest: Effort normal and breath sounds without rales or wheezing.                Abd:  Soft, NT, ND, + BS, no organomegaly               Neurological: Pt is alert.  At baseline orientation, motor grossly intact               Skin: Skin is warm. No rashes, no other new lesions, LE edema - none               Psychiatric: Pt behavior is normal without agitation , depressed affect  Micro: none  Cardiac tracings I have personally interpreted today:  none  Pertinent Radiological findings (summarize): none   Lab Results  Component Value Date   WBC 5.8 06/16/2023   HGB 14.3 06/16/2023   HCT 43.7 06/16/2023   PLT 245.0 06/16/2023   GLUCOSE 95 08/19/2023   CHOL 100 06/16/2023   TRIG 76.0 06/16/2023   HDL 63.70 06/16/2023   LDLCALC 21 06/16/2023   ALT 21 06/16/2023   AST 19 06/16/2023   NA 140 08/19/2023   K 4.5 08/19/2023   CL 101 08/19/2023   CREATININE 0.60 08/19/2023   BUN 15 08/19/2023   CO2 23 08/19/2023   TSH 0.70 06/16/2023   HGBA1C 5.9 06/16/2023   MICROALBUR <0.2 08/04/2022   Assessment/Plan:  Taylor Li is a 73 y.o. White or Caucasian [1] female with  has a past medical history of A-fib (HCC), Allergy, Anxiety, Arthritis, C. difficile diarrhea, Cancer (HCC), Cataract, Coronary artery disease, Depression, Dysrhythmia, Hepatitis, History of kidney stones, Hyperlipidemia, Hypertension, Personal history of colonic polyp- adenoma (09/22/2013), Pneumonia, Pre-diabetes, Pre-invasive breast cancer, Rectal prolapse, and Vitamin D  deficiency.  Anxiety and depression With mild worsening, no SI or HI - for restart celexa  40 mg every day, consider re-adding wellbutrin  as well, but pt prefers this to start  Insomnia With mild to mod worsening, for increased gabapentin  300 mg qhs  RLS (restless legs syndrome) Worsening as well, now for increased gabapentin  300 mg at bedtime prn  Essential hypertension Mildly elevated last wk per pt with what sounds like mild URI symptoms, now improved, cont current med tx  BP Readings from Last 3 Encounters:  12/02/23 124/78  06/15/23 (!) 138/94  03/31/23 118/80   Stable, pt to continue medical  treatment micardis  40 mg qd  Followup: Return if symptoms worsen or fail to improve.  Lynwood Rush, MD 12/02/2023 8:28 PM [facility]  Primary Care - [facility] Internal Medicine

## 2023-12-02 NOTE — Assessment & Plan Note (Signed)
 With mild to mod worsening, for increased gabapentin  300 mg qhs

## 2023-12-02 NOTE — Assessment & Plan Note (Signed)
 Mildly elevated last wk per pt with what sounds like mild URI symptoms, now improved, cont current med tx  BP Readings from Last 3 Encounters:  12/02/23 124/78  06/15/23 (!) 138/94  03/31/23 118/80   Stable, pt to continue medical treatment micardis  40 mg qd

## 2023-12-03 ENCOUNTER — Other Ambulatory Visit: Payer: Self-pay

## 2023-12-03 MED ORDER — CITALOPRAM HYDROBROMIDE 40 MG PO TABS: 40.0000 mg | ORAL_TABLET | Freq: Every day | ORAL | 11 refills | Status: DC

## 2023-12-11 DIAGNOSIS — R948 Abnormal results of function studies of other organs and systems: Secondary | ICD-10-CM | POA: Diagnosis not present

## 2023-12-11 DIAGNOSIS — Z881 Allergy status to other antibiotic agents status: Secondary | ICD-10-CM | POA: Diagnosis not present

## 2023-12-11 DIAGNOSIS — Z79899 Other long term (current) drug therapy: Secondary | ICD-10-CM | POA: Diagnosis not present

## 2023-12-11 DIAGNOSIS — I7121 Aneurysm of the ascending aorta, without rupture: Secondary | ICD-10-CM | POA: Diagnosis not present

## 2023-12-11 DIAGNOSIS — R748 Abnormal levels of other serum enzymes: Secondary | ICD-10-CM | POA: Diagnosis not present

## 2023-12-11 DIAGNOSIS — K219 Gastro-esophageal reflux disease without esophagitis: Secondary | ICD-10-CM | POA: Diagnosis not present

## 2023-12-11 DIAGNOSIS — R1013 Epigastric pain: Secondary | ICD-10-CM | POA: Diagnosis not present

## 2023-12-11 DIAGNOSIS — K859 Acute pancreatitis without necrosis or infection, unspecified: Secondary | ICD-10-CM | POA: Diagnosis not present

## 2023-12-11 DIAGNOSIS — I10 Essential (primary) hypertension: Secondary | ICD-10-CM | POA: Diagnosis not present

## 2023-12-11 DIAGNOSIS — I4891 Unspecified atrial fibrillation: Secondary | ICD-10-CM | POA: Diagnosis not present

## 2023-12-16 ENCOUNTER — Encounter: Payer: Self-pay | Admitting: Family Medicine

## 2023-12-17 NOTE — Telephone Encounter (Signed)
 Spoke with patient, virtual visit is scheduled

## 2023-12-18 ENCOUNTER — Telehealth: Admitting: Family Medicine

## 2023-12-18 VITALS — BP 122/85

## 2023-12-18 DIAGNOSIS — K859 Acute pancreatitis without necrosis or infection, unspecified: Secondary | ICD-10-CM

## 2023-12-18 DIAGNOSIS — Z8679 Personal history of other diseases of the circulatory system: Secondary | ICD-10-CM

## 2023-12-18 DIAGNOSIS — Z9889 Other specified postprocedural states: Secondary | ICD-10-CM

## 2023-12-18 DIAGNOSIS — F331 Major depressive disorder, recurrent, moderate: Secondary | ICD-10-CM | POA: Diagnosis not present

## 2023-12-18 DIAGNOSIS — I10 Essential (primary) hypertension: Secondary | ICD-10-CM | POA: Diagnosis not present

## 2023-12-18 DIAGNOSIS — N949 Unspecified condition associated with female genital organs and menstrual cycle: Secondary | ICD-10-CM | POA: Diagnosis not present

## 2023-12-18 MED ORDER — BUPROPION HCL ER (XL) 150 MG PO TB24: 150.0000 mg | ORAL_TABLET | Freq: Every day | ORAL | 1 refills | Status: AC

## 2023-12-18 NOTE — Progress Notes (Unsigned)
 error

## 2023-12-18 NOTE — Progress Notes (Signed)
 Video Visit Note  Subjective:     Patient ID: Taylor Li, female    DOB: [birth year], 73 y.o.   MRN: [id number]  No chief complaint on file.   HPI  I connected with Taylor Li on 12/21/23 at  3:20 PM EST by a video enabled telemedicine application and verified that I am speaking with the correct person using two identifiers.  Patient Location: Home Provider Location: Office/Clinic  I discussed the limitations, risks, security, and privacy concerns of performing an evaluation and management service by video and the availability of in person appointments. I also discussed with the patient that there may be a patient responsible charge related to this service. The patient expressed understanding and agreed to proceed.  Discussed the use of AI scribe software for clinical note transcription with the patient, who gave verbal consent to proceed.  Srinidhi Landers is a 73 year old female with a history of thoracic aneurysm who presents with symptoms suggestive of acute pancreatitis.  Abdominal and back pain - Initial onset November 19th with back pain and abdominal discomfort, without fever - By November 22nd, developed shooting pains in lower abdomen, severe wave-like back pain radiating to abdomen by November 26th - Pain improved slightly with leaning over - Currently, ongoing back pain and occasional abdominal pain, but improved since hospitalization  Gastrointestinal symptoms - Intermittent diarrhea since November 19th - Nausea and vomiting (yellow emesis) began November 26th - Poor appetite and fatigue since November 22nd - Symptoms improved with soft diet and liquids - No current need for hydrocodone or Zofran   Cognitive and constitutional symptoms - Lightheadedness began November 22nd, associated with lower than usual blood pressure - Difficulty sleeping and feeling unable to think clearly during acute illness - Fatigue present since November  22nd  Hospitalization and laboratory findings - Hospitalized for acute pancreatitis after severe symptoms on November 26th - Received morphine for pain and antiemetics for nausea - Laboratory findings: elevated lipase, bilirubin, and white blood cell count - Discharged with hydrocodone and Zofran , not required since discharge  Vascular and cardiac history - Thoracic aneurysm, 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  Constitutional:      General: She is not in acute distress. HENT:     Head: Normocephalic and atraumatic.  Eyes:     Extraocular Movements: Extraocular movements intact.  Pulmonary:     Effort: Pulmonary effort is normal.  Musculoskeletal:     Cervical back: Normal range of motion.  Neurological:     General: No focal deficit present.     Mental Status: She is alert and oriented to person, place, and time.  Psychiatric:        Mood and Affect: Mood normal.        Behavior: Behavior normal.     No results found for any visits on 12/18/23.      Assessment & Plan:   Assessment and Plan Acute pancreatitis Recent episode with elevated lipase, bilirubin,  and WBC count. Symptoms improving with dietary modifications. Discussed recurrence potential with dietary triggers. - Ordered pelvic ultrasound to evaluate adnexal cyst and rule out other causes. - Advised dietary modifications to avoid greasy and fried foods. - Concern for SSRI causing pancreatitis  Major depressive  disorder, recurrent, moderate Currently not on medication. Previous treatment with citalopram  and Wellbutrin . Decision to trial Wellbutrin  alone due to previous effectiveness and lack of new side effects. Discussed potential for Wellbutrin  to increase heart rate. - Prescribed Wellbutrin  XL 150 mg daily. - Monitor response to Wellbutrin  and adjust as needed.  Essential hypertension Blood pressure readings variable. Discussed impact of stress and recent illness. - Continue to monitor blood pressure regularly.  Atrial fibrillation, post-ablation Post-ablation status with occasional short runs of atrial fibrillation. Recent EKG showed short run. Discussed potential stressors impacting rhythm. - Continue to monitor heart rhythm and symptoms.  Right adnexal cyst Noted on imaging. Previous history of ovarian cysts and hysterectomy. No current symptoms suggestive of complications. - Ordered pelvic ultrasound to evaluate right adnexal cyst.  General Health Maintenance Discussed recent mammogram and breast implant status. No current issues with breast implant. - Continue routine monitoring for any changes in breast implant or symptoms.    Orders Placed This Encounter  Procedures   US  Pelvis Complete    Standing Status:   Future    Expiration Date:   12/17/2024    Reason for Exam (SYMPTOM  OR DIAGNOSIS REQUIRED):   adnexal cyst    Preferred imaging location?:   [facility]     Meds ordered this encounter  Medications   buPROPion  (WELLBUTRIN  XL) 150 MG 24 hr tablet    Sig: Take 1 tablet (150 mg total) by mouth daily.    Dispense:  30 tablet    Refill:  1    The patient was advised to call back or seek an in-person evaluation if the symptoms worsen or if the condition fails to improve as anticipated.  Return in about 3 months (around 03/17/2024) for meds OV.  Corean LITTIE Ku, FNP

## 2023-12-21 ENCOUNTER — Ambulatory Visit: Admitting: Family Medicine

## 2023-12-21 ENCOUNTER — Encounter: Payer: Self-pay | Admitting: Family Medicine

## 2023-12-21 NOTE — Patient Instructions (Signed)
 Wellbutrin  XL 150mg  has been sent to her pharmacy.  May take 1 tablet once a day.  Reported an ultrasound of her pelvis for further evaluation of the cyst that was seen on her CT.  Someone should be reaching out to get you scheduled.  I will be in touch with results once they are received.  Follow up with me in about 3 months for labs and medication management, sooner if needed.

## 2023-12-24 ENCOUNTER — Ambulatory Visit (HOSPITAL_BASED_OUTPATIENT_CLINIC_OR_DEPARTMENT_OTHER)
Admission: RE | Admit: 2023-12-24 | Discharge: 2023-12-24 | Disposition: A | Discharge status: Home / Self Care | Source: Ambulatory Visit | Attending: Family Medicine | Admitting: Family Medicine

## 2023-12-24 DIAGNOSIS — N949 Unspecified condition associated with female genital organs and menstrual cycle: Secondary | ICD-10-CM | POA: Diagnosis present

## 2023-12-25 ENCOUNTER — Encounter: Payer: Self-pay | Admitting: Family Medicine

## 2024-01-01 ENCOUNTER — Encounter: Payer: Self-pay | Admitting: Family Medicine

## 2024-01-01 DIAGNOSIS — R1084 Generalized abdominal pain: Secondary | ICD-10-CM

## 2024-01-01 DIAGNOSIS — N83201 Unspecified ovarian cyst, right side: Secondary | ICD-10-CM

## 2024-01-09 ENCOUNTER — Other Ambulatory Visit: Payer: Self-pay | Admitting: Cardiovascular Disease

## 2024-01-09 DIAGNOSIS — I2510 Atherosclerotic heart disease of native coronary artery without angina pectoris: Secondary | ICD-10-CM

## 2024-01-18 ENCOUNTER — Encounter: Payer: Self-pay | Admitting: Family Medicine

## 2024-01-18 DIAGNOSIS — R102 Pelvic and perineal pain unspecified side: Secondary | ICD-10-CM

## 2024-01-18 DIAGNOSIS — N83201 Unspecified ovarian cyst, right side: Secondary | ICD-10-CM

## 2024-01-20 ENCOUNTER — Other Ambulatory Visit: Payer: Self-pay | Admitting: Physician Assistant

## 2024-01-20 DIAGNOSIS — I480 Paroxysmal atrial fibrillation: Secondary | ICD-10-CM

## 2024-01-20 DIAGNOSIS — I4891 Unspecified atrial fibrillation: Secondary | ICD-10-CM

## 2024-01-21 ENCOUNTER — Other Ambulatory Visit: Payer: Self-pay | Admitting: Family Medicine

## 2024-01-21 DIAGNOSIS — N83201 Unspecified ovarian cyst, right side: Secondary | ICD-10-CM

## 2024-01-21 DIAGNOSIS — R102 Pelvic and perineal pain unspecified side: Secondary | ICD-10-CM

## 2024-01-21 MED ORDER — HYDROCODONE-ACETAMINOPHEN 5-325 MG PO TABS: 1.0000 | ORAL_TABLET | Freq: Four times a day (QID) | ORAL | 0 refills | Status: AC | PRN

## 2024-01-25 ENCOUNTER — Ambulatory Visit
Admission: RE | Admit: 2024-01-25 | Discharge: 2024-01-25 | Disposition: A | Source: Ambulatory Visit | Attending: Family Medicine

## 2024-01-25 DIAGNOSIS — R1084 Generalized abdominal pain: Secondary | ICD-10-CM

## 2024-01-25 DIAGNOSIS — N83201 Unspecified ovarian cyst, right side: Secondary | ICD-10-CM

## 2024-01-25 MED ORDER — GADOPICLENOL 0.5 MMOL/ML IV SOLN
8.0000 mL | Freq: Once | INTRAVENOUS | Status: AC | PRN
  Administered 2024-01-25: 8 mL via INTRAVENOUS

## 2024-01-28 ENCOUNTER — Encounter: Payer: Self-pay | Admitting: Family Medicine

## 2024-01-28 ENCOUNTER — Encounter: Payer: Self-pay | Admitting: Cardiovascular Disease

## 2024-02-04 ENCOUNTER — Ambulatory Visit: Payer: Self-pay | Admitting: Family Medicine

## 2024-02-04 NOTE — Progress Notes (Signed)
 Patient aware of results and has been referred to [facility]

## 2024-02-15 ENCOUNTER — Other Ambulatory Visit (HOSPITAL_COMMUNITY): Payer: Self-pay

## 2024-02-15 ENCOUNTER — Telehealth: Payer: Self-pay

## 2024-02-15 NOTE — Telephone Encounter (Signed)
 Pharmacy Patient Advocate Encounter   Received notification from [facility] that prior authorization for Cyclobenzaprine  Hcl 10 is required/requested.   Insurance verification completed.   The patient is insured through [facility] ADVANTAGE/RX ADVANCE.   Per test claim: PA required; PA submitted to above mentioned insurance via Latent Key/confirmation #/EOC [id number] Status is pending

## 2024-02-16 NOTE — Telephone Encounter (Signed)
 PA Approved.

## 2024-02-17 ENCOUNTER — Encounter: Payer: Self-pay | Admitting: Family Medicine

## 2024-02-18 ENCOUNTER — Other Ambulatory Visit: Payer: Self-pay

## 2024-02-18 DIAGNOSIS — M79671 Pain in right foot: Secondary | ICD-10-CM

## 2024-02-23 ENCOUNTER — Ambulatory Visit (HOSPITAL_COMMUNITY)

## 2024-02-23 ENCOUNTER — Encounter: Admitting: Obstetrics & Gynecology

## 2024-02-25 ENCOUNTER — Encounter: Admitting: Obstetrics & Gynecology

## 2024-02-25 ENCOUNTER — Ambulatory Visit: Admitting: Podiatry

## 2024-03-11 ENCOUNTER — Ambulatory Visit (HOSPITAL_COMMUNITY): Admitting: Physician Assistant

## 2024-03-30 ENCOUNTER — Ambulatory Visit: Admitting: Cardiovascular Disease

## 2024-07-12 ENCOUNTER — Ambulatory Visit
